# Patient Record
Sex: Female | Born: 1935 | Race: Black or African American | Hispanic: No | Smoking: Never smoker
Health system: Southern US, Community
[De-identification: ages and names within clinical notes are randomized; demographics above are authoritative.]

## PROBLEM LIST (undated history)

## (undated) DIAGNOSIS — I219 Acute myocardial infarction, unspecified: Secondary | ICD-10-CM

## (undated) DIAGNOSIS — Z992 Dependence on renal dialysis: Secondary | ICD-10-CM

## (undated) DIAGNOSIS — I739 Peripheral vascular disease, unspecified: Secondary | ICD-10-CM

## (undated) DIAGNOSIS — J449 Chronic obstructive pulmonary disease, unspecified: Secondary | ICD-10-CM

## (undated) DIAGNOSIS — K5790 Diverticulosis of intestine, part unspecified, without perforation or abscess without bleeding: Secondary | ICD-10-CM

## (undated) DIAGNOSIS — E785 Hyperlipidemia, unspecified: Secondary | ICD-10-CM

## (undated) DIAGNOSIS — D649 Anemia, unspecified: Secondary | ICD-10-CM

## (undated) DIAGNOSIS — K219 Gastro-esophageal reflux disease without esophagitis: Secondary | ICD-10-CM

## (undated) DIAGNOSIS — I639 Cerebral infarction, unspecified: Secondary | ICD-10-CM

## (undated) DIAGNOSIS — M2040 Other hammer toe(s) (acquired), unspecified foot: Secondary | ICD-10-CM

## (undated) DIAGNOSIS — K648 Other hemorrhoids: Secondary | ICD-10-CM

## (undated) DIAGNOSIS — N186 End stage renal disease: Secondary | ICD-10-CM

## (undated) DIAGNOSIS — K635 Polyp of colon: Secondary | ICD-10-CM

## (undated) DIAGNOSIS — I10 Essential (primary) hypertension: Secondary | ICD-10-CM

## (undated) HISTORY — PX: AV FISTULA PLACEMENT: SHX1204

## (undated) HISTORY — DX: Gastro-esophageal reflux disease without esophagitis: K21.9

## (undated) HISTORY — DX: Other hemorrhoids: K64.8

## (undated) HISTORY — DX: Hyperlipidemia, unspecified: E78.5

## (undated) HISTORY — DX: Anemia, unspecified: D64.9

## (undated) HISTORY — PX: ABDOMINAL HYSTERECTOMY: SHX81

## (undated) HISTORY — DX: Other hammer toe(s) (acquired), unspecified foot: M20.40

## (undated) HISTORY — PX: EYE SURGERY: SHX253

## (undated) HISTORY — DX: Polyp of colon: K63.5

## (undated) HISTORY — PX: CATARACT EXTRACTION: SUR2

## (undated) HISTORY — DX: Diverticulosis of intestine, part unspecified, without perforation or abscess without bleeding: K57.90

## (undated) HISTORY — DX: Peripheral vascular disease, unspecified: I73.9

## (undated) HISTORY — DX: Essential (primary) hypertension: I10

## (undated) HISTORY — DX: Chronic obstructive pulmonary disease, unspecified: J44.9

---

## 1997-02-21 ENCOUNTER — Encounter (INDEPENDENT_AMBULATORY_CARE_PROVIDER_SITE_OTHER): Payer: Self-pay | Admitting: *Deleted

## 1997-12-17 ENCOUNTER — Encounter: Admission: RE | Admit: 1997-12-17 | Discharge: 1997-12-17 | Payer: Self-pay | Admitting: Family Medicine

## 1998-01-24 ENCOUNTER — Encounter: Admission: RE | Admit: 1998-01-24 | Discharge: 1998-01-24 | Payer: Self-pay | Admitting: Family Medicine

## 1998-02-06 ENCOUNTER — Emergency Department (HOSPITAL_COMMUNITY): Admission: EM | Admit: 1998-02-06 | Discharge: 1998-02-06 | Payer: Self-pay | Admitting: Emergency Medicine

## 1998-02-14 ENCOUNTER — Emergency Department (HOSPITAL_COMMUNITY): Admission: EM | Admit: 1998-02-14 | Discharge: 1998-02-14 | Payer: Self-pay | Admitting: Emergency Medicine

## 1998-05-22 ENCOUNTER — Encounter: Admission: RE | Admit: 1998-05-22 | Discharge: 1998-05-22 | Payer: Self-pay | Admitting: Family Medicine

## 1998-05-28 ENCOUNTER — Ambulatory Visit (HOSPITAL_COMMUNITY): Admission: RE | Admit: 1998-05-28 | Discharge: 1998-05-28 | Payer: Self-pay | Admitting: Ophthalmology

## 1998-06-18 ENCOUNTER — Encounter: Admission: RE | Admit: 1998-06-18 | Discharge: 1998-06-18 | Payer: Self-pay | Admitting: Sports Medicine

## 1998-07-26 ENCOUNTER — Encounter: Admission: RE | Admit: 1998-07-26 | Discharge: 1998-07-26 | Payer: Self-pay | Admitting: Family Medicine

## 1998-10-28 ENCOUNTER — Encounter: Admission: RE | Admit: 1998-10-28 | Discharge: 1998-10-28 | Payer: Self-pay | Admitting: Family Medicine

## 1999-03-18 ENCOUNTER — Encounter: Admission: RE | Admit: 1999-03-18 | Discharge: 1999-03-18 | Payer: Self-pay | Admitting: Family Medicine

## 1999-05-02 ENCOUNTER — Encounter: Admission: RE | Admit: 1999-05-02 | Discharge: 1999-05-02 | Payer: Self-pay | Admitting: Family Medicine

## 1999-07-16 ENCOUNTER — Encounter: Admission: RE | Admit: 1999-07-16 | Discharge: 1999-07-16 | Payer: Self-pay | Admitting: Family Medicine

## 1999-09-29 ENCOUNTER — Encounter: Admission: RE | Admit: 1999-09-29 | Discharge: 1999-09-29 | Payer: Self-pay | Admitting: Family Medicine

## 1999-10-20 ENCOUNTER — Encounter: Admission: RE | Admit: 1999-10-20 | Discharge: 1999-10-20 | Payer: Self-pay | Admitting: *Deleted

## 1999-10-20 ENCOUNTER — Encounter: Payer: Self-pay | Admitting: *Deleted

## 1999-11-11 ENCOUNTER — Encounter: Admission: RE | Admit: 1999-11-11 | Discharge: 1999-11-11 | Payer: Self-pay | Admitting: Family Medicine

## 2000-01-05 ENCOUNTER — Encounter: Admission: RE | Admit: 2000-01-05 | Discharge: 2000-01-05 | Payer: Self-pay | Admitting: Family Medicine

## 2000-08-31 ENCOUNTER — Encounter: Admission: RE | Admit: 2000-08-31 | Discharge: 2000-08-31 | Payer: Self-pay | Admitting: Family Medicine

## 2000-09-22 ENCOUNTER — Encounter: Admission: RE | Admit: 2000-09-22 | Discharge: 2000-09-22 | Payer: Self-pay | Admitting: Family Medicine

## 2000-10-21 ENCOUNTER — Encounter: Payer: Self-pay | Admitting: Sports Medicine

## 2000-10-21 ENCOUNTER — Encounter: Admission: RE | Admit: 2000-10-21 | Discharge: 2000-10-21 | Payer: Self-pay | Admitting: *Deleted

## 2000-11-17 ENCOUNTER — Encounter: Admission: RE | Admit: 2000-11-17 | Discharge: 2000-11-17 | Payer: Self-pay | Admitting: Family Medicine

## 2000-12-22 ENCOUNTER — Encounter: Admission: RE | Admit: 2000-12-22 | Discharge: 2000-12-22 | Payer: Self-pay | Admitting: Family Medicine

## 2001-04-07 ENCOUNTER — Encounter: Admission: RE | Admit: 2001-04-07 | Discharge: 2001-04-07 | Payer: Self-pay | Admitting: Family Medicine

## 2001-04-19 ENCOUNTER — Encounter: Admission: RE | Admit: 2001-04-19 | Discharge: 2001-04-19 | Payer: Self-pay | Admitting: Sports Medicine

## 2001-04-19 ENCOUNTER — Encounter: Payer: Self-pay | Admitting: Sports Medicine

## 2001-04-21 ENCOUNTER — Encounter: Admission: RE | Admit: 2001-04-21 | Discharge: 2001-04-21 | Payer: Self-pay | Admitting: Sports Medicine

## 2001-05-09 ENCOUNTER — Encounter: Admission: RE | Admit: 2001-05-09 | Discharge: 2001-05-09 | Payer: Self-pay | Admitting: Family Medicine

## 2001-07-04 ENCOUNTER — Encounter: Admission: RE | Admit: 2001-07-04 | Discharge: 2001-07-04 | Payer: Self-pay | Admitting: Family Medicine

## 2001-07-13 ENCOUNTER — Ambulatory Visit (HOSPITAL_COMMUNITY): Admission: RE | Admit: 2001-07-13 | Discharge: 2001-07-13 | Payer: Self-pay | Admitting: Gastroenterology

## 2001-07-13 ENCOUNTER — Encounter (INDEPENDENT_AMBULATORY_CARE_PROVIDER_SITE_OTHER): Payer: Self-pay | Admitting: *Deleted

## 2001-07-15 ENCOUNTER — Encounter: Payer: Self-pay | Admitting: Gastroenterology

## 2001-07-15 ENCOUNTER — Ambulatory Visit (HOSPITAL_COMMUNITY): Admission: RE | Admit: 2001-07-15 | Discharge: 2001-07-15 | Payer: Self-pay | Admitting: Gastroenterology

## 2001-08-25 ENCOUNTER — Inpatient Hospital Stay (HOSPITAL_COMMUNITY): Admission: RE | Admit: 2001-08-25 | Discharge: 2001-09-01 | Payer: Self-pay | Admitting: Surgery

## 2001-08-25 ENCOUNTER — Encounter (INDEPENDENT_AMBULATORY_CARE_PROVIDER_SITE_OTHER): Payer: Self-pay | Admitting: Specialist

## 2001-11-17 ENCOUNTER — Encounter: Admission: RE | Admit: 2001-11-17 | Discharge: 2001-11-17 | Payer: Self-pay | Admitting: Family Medicine

## 2001-12-08 ENCOUNTER — Encounter: Admission: RE | Admit: 2001-12-08 | Discharge: 2001-12-08 | Payer: Self-pay | Admitting: Family Medicine

## 2001-12-15 ENCOUNTER — Encounter: Payer: Self-pay | Admitting: Sports Medicine

## 2001-12-15 ENCOUNTER — Encounter: Admission: RE | Admit: 2001-12-15 | Discharge: 2001-12-15 | Payer: Self-pay | Admitting: Sports Medicine

## 2002-01-16 ENCOUNTER — Encounter: Admission: RE | Admit: 2002-01-16 | Discharge: 2002-01-16 | Payer: Self-pay | Admitting: Family Medicine

## 2002-02-16 ENCOUNTER — Encounter: Admission: RE | Admit: 2002-02-16 | Discharge: 2002-02-16 | Payer: Self-pay | Admitting: Sports Medicine

## 2002-02-20 ENCOUNTER — Encounter: Admission: RE | Admit: 2002-02-20 | Discharge: 2002-02-20 | Payer: Self-pay | Admitting: Family Medicine

## 2002-03-03 ENCOUNTER — Encounter: Admission: RE | Admit: 2002-03-03 | Discharge: 2002-03-03 | Payer: Self-pay | Admitting: Family Medicine

## 2002-04-03 ENCOUNTER — Encounter: Admission: RE | Admit: 2002-04-03 | Discharge: 2002-04-03 | Payer: Self-pay | Admitting: Family Medicine

## 2002-04-26 ENCOUNTER — Ambulatory Visit (HOSPITAL_COMMUNITY): Admission: RE | Admit: 2002-04-26 | Discharge: 2002-04-26 | Payer: Self-pay | Admitting: Gastroenterology

## 2002-04-26 ENCOUNTER — Encounter (INDEPENDENT_AMBULATORY_CARE_PROVIDER_SITE_OTHER): Payer: Self-pay | Admitting: Specialist

## 2002-08-09 ENCOUNTER — Encounter: Admission: RE | Admit: 2002-08-09 | Discharge: 2002-08-09 | Payer: Self-pay | Admitting: Family Medicine

## 2003-01-24 ENCOUNTER — Encounter: Admission: RE | Admit: 2003-01-24 | Discharge: 2003-01-24 | Payer: Self-pay | Admitting: Family Medicine

## 2003-01-31 ENCOUNTER — Encounter: Payer: Self-pay | Admitting: Sports Medicine

## 2003-01-31 ENCOUNTER — Encounter: Admission: RE | Admit: 2003-01-31 | Discharge: 2003-01-31 | Payer: Self-pay | Admitting: Sports Medicine

## 2003-02-22 ENCOUNTER — Encounter: Admission: RE | Admit: 2003-02-22 | Discharge: 2003-02-22 | Payer: Self-pay | Admitting: Sports Medicine

## 2003-04-17 ENCOUNTER — Encounter: Admission: RE | Admit: 2003-04-17 | Discharge: 2003-04-17 | Payer: Self-pay | Admitting: Sports Medicine

## 2003-07-10 ENCOUNTER — Encounter: Admission: RE | Admit: 2003-07-10 | Discharge: 2003-07-10 | Payer: Self-pay | Admitting: Sports Medicine

## 2003-08-07 ENCOUNTER — Encounter: Admission: RE | Admit: 2003-08-07 | Discharge: 2003-08-07 | Payer: Self-pay | Admitting: Sports Medicine

## 2003-08-13 ENCOUNTER — Encounter: Admission: RE | Admit: 2003-08-13 | Discharge: 2003-08-13 | Payer: Self-pay | Admitting: Family Medicine

## 2003-09-28 ENCOUNTER — Encounter: Admission: RE | Admit: 2003-09-28 | Discharge: 2003-09-28 | Payer: Self-pay | Admitting: Family Medicine

## 2003-10-25 ENCOUNTER — Encounter: Admission: RE | Admit: 2003-10-25 | Discharge: 2003-10-25 | Payer: Self-pay | Admitting: Family Medicine

## 2003-12-26 ENCOUNTER — Encounter: Admission: RE | Admit: 2003-12-26 | Discharge: 2003-12-26 | Payer: Self-pay | Admitting: Family Medicine

## 2004-02-29 ENCOUNTER — Encounter: Admission: RE | Admit: 2004-02-29 | Discharge: 2004-02-29 | Payer: Self-pay | Admitting: Family Medicine

## 2004-04-30 ENCOUNTER — Ambulatory Visit: Payer: Self-pay | Admitting: Family Medicine

## 2004-05-07 ENCOUNTER — Ambulatory Visit: Payer: Self-pay | Admitting: Family Medicine

## 2004-05-12 ENCOUNTER — Encounter: Admission: RE | Admit: 2004-05-12 | Discharge: 2004-05-12 | Payer: Self-pay | Admitting: Sports Medicine

## 2005-03-18 ENCOUNTER — Ambulatory Visit: Payer: Self-pay | Admitting: Family Medicine

## 2005-05-26 ENCOUNTER — Ambulatory Visit: Payer: Self-pay | Admitting: Sports Medicine

## 2005-06-01 ENCOUNTER — Encounter (INDEPENDENT_AMBULATORY_CARE_PROVIDER_SITE_OTHER): Payer: Self-pay | Admitting: *Deleted

## 2005-06-01 ENCOUNTER — Ambulatory Visit (HOSPITAL_COMMUNITY): Admission: RE | Admit: 2005-06-01 | Discharge: 2005-06-01 | Payer: Self-pay | Admitting: Gastroenterology

## 2005-06-24 ENCOUNTER — Ambulatory Visit: Payer: Self-pay | Admitting: Family Medicine

## 2005-07-31 ENCOUNTER — Ambulatory Visit: Payer: Self-pay | Admitting: Family Medicine

## 2005-08-18 ENCOUNTER — Encounter: Admission: RE | Admit: 2005-08-18 | Discharge: 2005-08-18 | Payer: Self-pay | Admitting: Sports Medicine

## 2006-03-02 ENCOUNTER — Ambulatory Visit: Payer: Self-pay | Admitting: Family Medicine

## 2006-04-28 ENCOUNTER — Ambulatory Visit: Payer: Self-pay | Admitting: Family Medicine

## 2006-06-30 ENCOUNTER — Ambulatory Visit: Payer: Self-pay | Admitting: Family Medicine

## 2006-08-26 ENCOUNTER — Ambulatory Visit: Payer: Self-pay | Admitting: Family Medicine

## 2006-10-05 ENCOUNTER — Encounter: Admission: RE | Admit: 2006-10-05 | Discharge: 2006-10-05 | Payer: Self-pay | Admitting: Sports Medicine

## 2006-10-22 ENCOUNTER — Encounter (INDEPENDENT_AMBULATORY_CARE_PROVIDER_SITE_OTHER): Payer: Self-pay | Admitting: *Deleted

## 2006-10-27 ENCOUNTER — Telehealth: Payer: Self-pay | Admitting: *Deleted

## 2006-10-27 ENCOUNTER — Encounter: Payer: Self-pay | Admitting: *Deleted

## 2006-10-29 ENCOUNTER — Ambulatory Visit: Payer: Self-pay | Admitting: Sports Medicine

## 2006-10-29 ENCOUNTER — Encounter (INDEPENDENT_AMBULATORY_CARE_PROVIDER_SITE_OTHER): Payer: Self-pay | Admitting: Family Medicine

## 2006-10-29 DIAGNOSIS — K279 Peptic ulcer, site unspecified, unspecified as acute or chronic, without hemorrhage or perforation: Secondary | ICD-10-CM | POA: Insufficient documentation

## 2006-10-29 DIAGNOSIS — K219 Gastro-esophageal reflux disease without esophagitis: Secondary | ICD-10-CM

## 2006-10-29 DIAGNOSIS — E1165 Type 2 diabetes mellitus with hyperglycemia: Secondary | ICD-10-CM

## 2006-10-29 DIAGNOSIS — E785 Hyperlipidemia, unspecified: Secondary | ICD-10-CM | POA: Insufficient documentation

## 2006-10-29 LAB — CONVERTED CEMR LAB: Hgb A1c MFr Bld: 7 %

## 2006-12-06 ENCOUNTER — Ambulatory Visit: Payer: Self-pay | Admitting: Family Medicine

## 2006-12-06 ENCOUNTER — Encounter (INDEPENDENT_AMBULATORY_CARE_PROVIDER_SITE_OTHER): Payer: Self-pay | Admitting: Family Medicine

## 2006-12-06 LAB — CONVERTED CEMR LAB
Calcium: 9.8 mg/dL (ref 8.4–10.5)
Creatinine, Ser: 1.51 mg/dL — ABNORMAL HIGH (ref 0.40–1.20)
Sodium: 144 meq/L (ref 135–145)

## 2006-12-07 ENCOUNTER — Telehealth: Payer: Self-pay | Admitting: *Deleted

## 2006-12-10 ENCOUNTER — Encounter: Admission: RE | Admit: 2006-12-10 | Discharge: 2006-12-10 | Payer: Self-pay | Admitting: Sports Medicine

## 2006-12-22 ENCOUNTER — Encounter (INDEPENDENT_AMBULATORY_CARE_PROVIDER_SITE_OTHER): Payer: Self-pay | Admitting: Family Medicine

## 2007-01-04 ENCOUNTER — Ambulatory Visit: Payer: Self-pay | Admitting: Family Medicine

## 2007-01-04 ENCOUNTER — Encounter: Admission: RE | Admit: 2007-01-04 | Discharge: 2007-01-04 | Payer: Self-pay | Admitting: Sports Medicine

## 2007-02-02 ENCOUNTER — Ambulatory Visit: Payer: Self-pay | Admitting: Sports Medicine

## 2007-02-02 LAB — CONVERTED CEMR LAB: Hgb A1c MFr Bld: 7.3 %

## 2007-02-16 ENCOUNTER — Encounter (INDEPENDENT_AMBULATORY_CARE_PROVIDER_SITE_OTHER): Payer: Self-pay | Admitting: Family Medicine

## 2007-04-12 ENCOUNTER — Encounter (INDEPENDENT_AMBULATORY_CARE_PROVIDER_SITE_OTHER): Payer: Self-pay | Admitting: Family Medicine

## 2007-05-06 ENCOUNTER — Ambulatory Visit: Payer: Self-pay | Admitting: Family Medicine

## 2007-05-06 LAB — CONVERTED CEMR LAB: Hgb A1c MFr Bld: 6.9 %

## 2007-05-10 ENCOUNTER — Encounter (INDEPENDENT_AMBULATORY_CARE_PROVIDER_SITE_OTHER): Payer: Self-pay | Admitting: Family Medicine

## 2007-05-17 ENCOUNTER — Encounter (INDEPENDENT_AMBULATORY_CARE_PROVIDER_SITE_OTHER): Payer: Self-pay | Admitting: Family Medicine

## 2007-05-26 ENCOUNTER — Encounter: Admission: RE | Admit: 2007-05-26 | Discharge: 2007-05-26 | Payer: Self-pay | Admitting: Sports Medicine

## 2007-05-27 ENCOUNTER — Telehealth (INDEPENDENT_AMBULATORY_CARE_PROVIDER_SITE_OTHER): Payer: Self-pay | Admitting: Family Medicine

## 2007-06-10 ENCOUNTER — Ambulatory Visit: Payer: Self-pay | Admitting: Internal Medicine

## 2007-06-10 ENCOUNTER — Encounter (INDEPENDENT_AMBULATORY_CARE_PROVIDER_SITE_OTHER): Payer: Self-pay | Admitting: Family Medicine

## 2007-06-10 DIAGNOSIS — I10 Essential (primary) hypertension: Secondary | ICD-10-CM

## 2007-06-14 ENCOUNTER — Encounter: Admission: RE | Admit: 2007-06-14 | Discharge: 2007-06-14 | Payer: Self-pay | Admitting: Sports Medicine

## 2007-06-15 ENCOUNTER — Telehealth (INDEPENDENT_AMBULATORY_CARE_PROVIDER_SITE_OTHER): Payer: Self-pay | Admitting: *Deleted

## 2007-07-18 ENCOUNTER — Encounter (INDEPENDENT_AMBULATORY_CARE_PROVIDER_SITE_OTHER): Payer: Self-pay | Admitting: Family Medicine

## 2007-07-18 ENCOUNTER — Ambulatory Visit: Payer: Self-pay | Admitting: Family Medicine

## 2007-07-18 LAB — CONVERTED CEMR LAB

## 2007-07-19 LAB — CONVERTED CEMR LAB
Calcium: 10 mg/dL (ref 8.4–10.5)
Potassium: 4.5 meq/L (ref 3.5–5.3)
Sodium: 143 meq/L (ref 135–145)

## 2007-07-20 ENCOUNTER — Ambulatory Visit: Payer: Self-pay | Admitting: Family Medicine

## 2007-09-06 ENCOUNTER — Ambulatory Visit: Payer: Self-pay | Admitting: Family Medicine

## 2007-10-10 ENCOUNTER — Encounter (INDEPENDENT_AMBULATORY_CARE_PROVIDER_SITE_OTHER): Payer: Self-pay | Admitting: Family Medicine

## 2007-10-18 ENCOUNTER — Encounter: Payer: Self-pay | Admitting: *Deleted

## 2007-11-08 ENCOUNTER — Ambulatory Visit: Payer: Self-pay | Admitting: Family Medicine

## 2007-11-08 LAB — CONVERTED CEMR LAB: Hgb A1c MFr Bld: 7.5 %

## 2007-12-06 ENCOUNTER — Encounter: Admission: RE | Admit: 2007-12-06 | Discharge: 2007-12-06 | Payer: Self-pay | Admitting: Family Medicine

## 2008-05-23 ENCOUNTER — Encounter: Payer: Self-pay | Admitting: Family Medicine

## 2008-06-20 ENCOUNTER — Ambulatory Visit: Payer: Self-pay | Admitting: Family Medicine

## 2008-06-20 LAB — CONVERTED CEMR LAB: Hgb A1c MFr Bld: 7.3 %

## 2008-06-21 ENCOUNTER — Encounter: Payer: Self-pay | Admitting: Family Medicine

## 2008-06-21 ENCOUNTER — Telehealth: Payer: Self-pay | Admitting: *Deleted

## 2008-06-21 LAB — CONVERTED CEMR LAB

## 2008-06-26 ENCOUNTER — Ambulatory Visit: Payer: Self-pay | Admitting: Family Medicine

## 2008-06-26 ENCOUNTER — Observation Stay (HOSPITAL_COMMUNITY): Admission: EM | Admit: 2008-06-26 | Discharge: 2008-06-27 | Payer: Self-pay | Admitting: Emergency Medicine

## 2008-07-09 ENCOUNTER — Ambulatory Visit: Payer: Self-pay | Admitting: Family Medicine

## 2008-07-09 ENCOUNTER — Encounter: Payer: Self-pay | Admitting: Family Medicine

## 2008-07-09 LAB — CONVERTED CEMR LAB
HDL: 63 mg/dL (ref >39)
LDL Cholesterol: 53 mg/dL (ref 0–99)
Total CHOL/HDL Ratio: 2
VLDL: 13 mg/dL (ref 0–40)

## 2008-07-11 ENCOUNTER — Ambulatory Visit: Payer: Self-pay | Admitting: Family Medicine

## 2008-10-19 ENCOUNTER — Telehealth: Payer: Self-pay | Admitting: Family Medicine

## 2008-11-27 ENCOUNTER — Ambulatory Visit: Payer: Self-pay | Admitting: Family Medicine

## 2008-11-27 LAB — CONVERTED CEMR LAB: Hgb A1c MFr Bld: 8.3 %

## 2009-02-04 ENCOUNTER — Telehealth: Payer: Self-pay | Admitting: Family Medicine

## 2009-02-05 ENCOUNTER — Encounter: Admission: RE | Admit: 2009-02-05 | Discharge: 2009-02-05 | Payer: Self-pay | Admitting: Family Medicine

## 2009-02-05 ENCOUNTER — Ambulatory Visit: Payer: Self-pay | Admitting: Family Medicine

## 2009-02-19 ENCOUNTER — Encounter: Admission: RE | Admit: 2009-02-19 | Discharge: 2009-02-19 | Payer: Self-pay | Admitting: Family Medicine

## 2009-03-14 ENCOUNTER — Ambulatory Visit: Payer: Self-pay | Admitting: Family Medicine

## 2009-03-18 ENCOUNTER — Encounter: Payer: Self-pay | Admitting: Family Medicine

## 2009-03-18 ENCOUNTER — Encounter: Admission: RE | Admit: 2009-03-18 | Discharge: 2009-03-18 | Payer: Self-pay | Admitting: Family Medicine

## 2009-03-18 ENCOUNTER — Ambulatory Visit: Payer: Self-pay | Admitting: Family Medicine

## 2009-03-19 LAB — CONVERTED CEMR LAB
ALT: 9 units/L (ref 0–35)
AST: 12 units/L (ref 0–37)
Albumin: 3.4 g/dL — ABNORMAL LOW (ref 3.5–5.2)
Alkaline Phosphatase: 76 units/L (ref 39–117)
BUN: 28 mg/dL — ABNORMAL HIGH (ref 6–23)
Creatinine, Ser: 2.38 mg/dL — ABNORMAL HIGH (ref 0.40–1.20)
Hemoglobin: 9.3 g/dL — ABNORMAL LOW (ref 12.0–15.0)
MCHC: 30.9 g/dL (ref 30.0–36.0)
Platelets: 205 10*3/uL (ref 150–400)
Potassium: 4.1 meq/L (ref 3.5–5.3)
RDW: 15.5 % (ref 11.5–15.5)

## 2009-03-20 ENCOUNTER — Telehealth: Payer: Self-pay | Admitting: Family Medicine

## 2009-03-20 DIAGNOSIS — D539 Nutritional anemia, unspecified: Secondary | ICD-10-CM

## 2009-04-03 ENCOUNTER — Telehealth: Payer: Self-pay | Admitting: *Deleted

## 2009-05-07 ENCOUNTER — Telehealth: Payer: Self-pay | Admitting: Family Medicine

## 2009-05-11 ENCOUNTER — Ambulatory Visit: Payer: Self-pay | Admitting: Family Medicine

## 2009-05-11 ENCOUNTER — Encounter: Payer: Self-pay | Admitting: Family Medicine

## 2009-05-11 ENCOUNTER — Observation Stay (HOSPITAL_COMMUNITY): Admission: EM | Admit: 2009-05-11 | Discharge: 2009-05-13 | Payer: Self-pay | Admitting: Emergency Medicine

## 2009-05-17 ENCOUNTER — Ambulatory Visit: Payer: Self-pay | Admitting: Family Medicine

## 2009-05-17 ENCOUNTER — Encounter: Payer: Self-pay | Admitting: Family Medicine

## 2009-05-17 LAB — CONVERTED CEMR LAB
CO2: 24 meq/L (ref 19–32)
Glucose, Bld: 279 mg/dL — ABNORMAL HIGH (ref 70–99)
Potassium: 4.2 meq/L (ref 3.5–5.3)
Sodium: 141 meq/L (ref 135–145)

## 2009-05-27 ENCOUNTER — Encounter: Payer: Self-pay | Admitting: Family Medicine

## 2009-06-20 ENCOUNTER — Encounter: Payer: Self-pay | Admitting: *Deleted

## 2009-06-20 ENCOUNTER — Ambulatory Visit: Payer: Self-pay | Admitting: Family Medicine

## 2009-06-20 ENCOUNTER — Encounter: Payer: Self-pay | Admitting: Family Medicine

## 2009-06-20 LAB — CONVERTED CEMR LAB
AST: 14 units/L (ref 0–37)
Alkaline Phosphatase: 100 units/L (ref 39–117)
BUN: 31 mg/dL — ABNORMAL HIGH (ref 6–23)
Creatinine, Ser: 2.41 mg/dL — ABNORMAL HIGH (ref 0.40–1.20)
HCT: 30.9 % — ABNORMAL LOW (ref 36.0–46.0)
Hemoglobin: 9.6 g/dL — ABNORMAL LOW (ref 12.0–15.0)
MCHC: 31.1 g/dL (ref 30.0–36.0)
RDW: 13.5 % (ref 11.5–15.5)
Total Bilirubin: 0.3 mg/dL (ref 0.3–1.2)

## 2009-07-01 ENCOUNTER — Encounter: Payer: Self-pay | Admitting: Family Medicine

## 2009-07-01 ENCOUNTER — Observation Stay (HOSPITAL_COMMUNITY): Admission: EM | Admit: 2009-07-01 | Discharge: 2009-07-02 | Payer: Self-pay | Admitting: Emergency Medicine

## 2009-07-01 ENCOUNTER — Ambulatory Visit: Payer: Self-pay | Admitting: Family Medicine

## 2009-07-09 ENCOUNTER — Ambulatory Visit: Payer: Self-pay | Admitting: Family Medicine

## 2009-07-29 ENCOUNTER — Encounter: Payer: Self-pay | Admitting: Family Medicine

## 2009-07-30 ENCOUNTER — Encounter: Payer: Self-pay | Admitting: Family Medicine

## 2009-08-20 ENCOUNTER — Ambulatory Visit: Payer: Self-pay | Admitting: Family Medicine

## 2009-08-28 ENCOUNTER — Encounter: Payer: Self-pay | Admitting: Family Medicine

## 2009-08-29 ENCOUNTER — Telehealth: Payer: Self-pay | Admitting: *Deleted

## 2009-09-02 ENCOUNTER — Other Ambulatory Visit: Payer: Self-pay | Admitting: Emergency Medicine

## 2009-09-02 ENCOUNTER — Ambulatory Visit (HOSPITAL_COMMUNITY): Admission: EM | Admit: 2009-09-02 | Discharge: 2009-09-02 | Payer: Self-pay | Admitting: Interventional Cardiology

## 2009-09-05 ENCOUNTER — Ambulatory Visit: Payer: Self-pay | Admitting: Family Medicine

## 2009-09-05 LAB — CONVERTED CEMR LAB: H Pylori IgG: POSITIVE

## 2009-09-30 ENCOUNTER — Emergency Department (HOSPITAL_COMMUNITY): Admission: EM | Admit: 2009-09-30 | Discharge: 2009-09-30 | Payer: Self-pay | Admitting: Emergency Medicine

## 2009-09-30 ENCOUNTER — Encounter: Payer: Self-pay | Admitting: Family Medicine

## 2009-10-01 ENCOUNTER — Ambulatory Visit: Payer: Self-pay | Admitting: Family Medicine

## 2009-10-03 ENCOUNTER — Ambulatory Visit: Payer: Self-pay | Admitting: Family Medicine

## 2009-10-17 ENCOUNTER — Encounter: Payer: Self-pay | Admitting: Family Medicine

## 2009-10-17 ENCOUNTER — Ambulatory Visit: Payer: Self-pay

## 2009-10-17 LAB — CONVERTED CEMR LAB: Hgb A1c MFr Bld: 7.6 %

## 2009-10-21 LAB — CONVERTED CEMR LAB
AST: 20 units/L (ref 0–37)
Albumin: 3.6 g/dL (ref 3.5–5.2)
BUN: 29 mg/dL — ABNORMAL HIGH (ref 6–23)
Calcium: 9.3 mg/dL (ref 8.4–10.5)
Chloride: 106 meq/L (ref 96–112)
Glucose, Bld: 90 mg/dL (ref 70–99)
Hemoglobin: 9.7 g/dL — ABNORMAL LOW (ref 12.0–15.0)
Potassium: 4.4 meq/L (ref 3.5–5.3)
RBC: 3.22 M/uL — ABNORMAL LOW (ref 3.87–5.11)
RDW: 13.8 % (ref 11.5–15.5)

## 2009-11-19 ENCOUNTER — Ambulatory Visit: Payer: Self-pay | Admitting: Family Medicine

## 2009-12-17 ENCOUNTER — Encounter: Payer: Self-pay | Admitting: Family Medicine

## 2009-12-19 ENCOUNTER — Encounter: Payer: Self-pay | Admitting: Family Medicine

## 2010-01-07 ENCOUNTER — Telehealth: Payer: Self-pay | Admitting: *Deleted

## 2010-01-08 ENCOUNTER — Encounter: Payer: Self-pay | Admitting: Family Medicine

## 2010-01-08 ENCOUNTER — Encounter: Admission: RE | Admit: 2010-01-08 | Discharge: 2010-01-08 | Payer: Self-pay | Admitting: Family Medicine

## 2010-01-08 ENCOUNTER — Ambulatory Visit: Payer: Self-pay | Admitting: Family Medicine

## 2010-01-10 ENCOUNTER — Telehealth: Payer: Self-pay | Admitting: Family Medicine

## 2010-01-13 ENCOUNTER — Encounter: Payer: Self-pay | Admitting: Family Medicine

## 2010-01-13 LAB — CONVERTED CEMR LAB
ALT: 14 units/L (ref 0–35)
AST: 15 units/L (ref 0–37)
Alkaline Phosphatase: 122 units/L — ABNORMAL HIGH (ref 39–117)
Basophils Absolute: 0 10*3/uL (ref 0.0–0.1)
Basophils Relative: 0 % (ref 0–1)
MCHC: 31.4 g/dL (ref 30.0–36.0)
Monocytes Relative: 5 % (ref 3–12)
Neutro Abs: 4.2 10*3/uL (ref 1.7–7.7)
Neutrophils Relative %: 68 % (ref 43–77)
RBC: 3.54 M/uL — ABNORMAL LOW (ref 3.87–5.11)
Sodium: 142 meq/L (ref 135–145)
Total Bilirubin: 0.5 mg/dL (ref 0.3–1.2)
Total Protein: 8.2 g/dL (ref 6.0–8.3)

## 2010-01-23 ENCOUNTER — Encounter: Payer: Self-pay | Admitting: Family Medicine

## 2010-02-11 ENCOUNTER — Encounter: Payer: Self-pay | Admitting: Family Medicine

## 2010-02-11 ENCOUNTER — Ambulatory Visit: Payer: Self-pay | Admitting: Family Medicine

## 2010-02-11 DIAGNOSIS — R000 Tachycardia, unspecified: Secondary | ICD-10-CM

## 2010-04-07 ENCOUNTER — Encounter: Payer: Self-pay | Admitting: Family Medicine

## 2010-05-05 ENCOUNTER — Encounter: Payer: Self-pay | Admitting: Family Medicine

## 2010-05-07 ENCOUNTER — Ambulatory Visit: Payer: Self-pay | Admitting: Family Medicine

## 2010-05-07 DIAGNOSIS — Z992 Dependence on renal dialysis: Secondary | ICD-10-CM

## 2010-05-07 DIAGNOSIS — N186 End stage renal disease: Secondary | ICD-10-CM

## 2010-06-16 ENCOUNTER — Ambulatory Visit: Payer: Self-pay | Admitting: Family Medicine

## 2010-07-21 ENCOUNTER — Encounter: Payer: Self-pay | Admitting: Family Medicine

## 2010-08-22 ENCOUNTER — Ambulatory Visit: Payer: Self-pay | Admitting: Vascular Surgery

## 2010-09-01 ENCOUNTER — Encounter (HOSPITAL_COMMUNITY)
Admission: RE | Admit: 2010-09-01 | Discharge: 2010-09-23 | Payer: Self-pay | Source: Home / Self Care | Attending: Nephrology | Admitting: Nephrology

## 2010-09-08 LAB — POCT HEMOGLOBIN-HEMACUE: Hemoglobin: 8.6 g/dL — ABNORMAL LOW (ref 12.0–15.0)

## 2010-09-18 ENCOUNTER — Ambulatory Visit: Admit: 2010-09-18 | Payer: Self-pay | Admitting: Vascular Surgery

## 2010-09-18 ENCOUNTER — Ambulatory Visit
Admission: RE | Admit: 2010-09-18 | Discharge: 2010-09-18 | Payer: Self-pay | Source: Home / Self Care | Attending: Vascular Surgery | Admitting: Vascular Surgery

## 2010-09-19 NOTE — Assessment & Plan Note (Addendum)
OFFICE VISIT  Claudia Rich, Claudia Rich DOB:  1927/07/28                                       09/18/2010 KYHCW#:[id number]  CHIEF COMPLAINT:  Needs hemodialysis access.  HISTORY OF PRESENT ILLNESS:  The patient is an 77 year old female referred by Dr. Lowell Guitar for evaluation of hemodialysis access.  She currently is not on dialysis.  She is right-handed.  She does not take any Coumadin or warfarin.  She currently does not have urgent need for dialysis and hopefully would have a working fistula prior to starting on dialysis.  CHRONIC MEDICAL PROBLEMS:  Include diabetes, hypertension, elevated cholesterol.  These are followed by Dr. Lowell Guitar.  PAST SURGICAL HISTORY:  She denies any prior operations on her upper extremities.  SOCIAL HISTORY:  She is widowed.  She is retired.  She is a nonsmoker and nonconsumer of alcohol.  FAMILY HISTORY:  Not remarkable for vascular disease at age less than 50.  REVIEW OF SYSTEMS:  She is 5 feet 3 inches, 136 pounds.  All other systems are negative on 12 points.  Review of records and data from Dr. Roanna Banning office showed most recent creatinine was 4.7 in November of 2011.  PHYSICAL EXAM:  Vital signs:  Heart rate is 80, blood pressure is 146/86 in the right arm, oxygen saturation is 95% on room air.  HEENT: Unremarkable.  Neck:  Has 2+ carotid pulses without bruit.  Chest: Clear to auscultation.  Cardiac:  Regular rate and rhythm without murmur.  Abdomen:  Soft, nontender, nondistended.  No masses. Musculoskeletal:  Shows no major joint deformities.  Neurologic:  Has no focal weakness or paresthesias.  Skin:  Has no open ulcers or rashes. Peripheral vascular:  Exam shows 2+ brachial and radial pulses bilaterally.  I reviewed her vein mapping ultrasound today which showed that the cephalic vein in the left forearm is small at the wrist level and in the upper arm level is 35 to 40 mm in diameter.  In the right upper extremity  the vein again is small in the forearm but between 34 and 44 mm in diameter in the upper arm.  I believe the best option for this patient will be placement of a left brachiocephalic AV fistula.  We have scheduled this for 09/24/2010. Risks, benefits, possible complications and procedure details were explained to the patient today including but not limited to bleeding, infection, nonmaturation of the fistula.  She understands and agrees to proceed.    Janetta Hora. Rushi Chasen, MD Electronically Signed  CEF/MEDQ  D:  09/19/2010  T:  09/19/2010  Job:  [id number]  cc:   Mindi Slicker. Lowell Guitar, M.D.

## 2010-09-21 LAB — CONVERTED CEMR LAB
Calcium, Total (PTH): 10.4 mg/dL (ref 8.4–10.5)
Phosphorus: 3.7 mg/dL (ref 2.3–4.6)
Vit D, 1,25-Dihydroxy: 12.9 — ABNORMAL LOW (ref 20–57)

## 2010-09-24 ENCOUNTER — Observation Stay (HOSPITAL_COMMUNITY)
Admission: RE | Admit: 2010-09-24 | Discharge: 2010-09-25 | Disposition: A | Discharge status: Home Health | Payer: Medicare FFS | Attending: Vascular Surgery | Admitting: Vascular Surgery

## 2010-09-24 DIAGNOSIS — Z01812 Encounter for preprocedural laboratory examination: Secondary | ICD-10-CM | POA: Insufficient documentation

## 2010-09-24 DIAGNOSIS — I120 Hypertensive chronic kidney disease with stage 5 chronic kidney disease or end stage renal disease: Principal | ICD-10-CM | POA: Insufficient documentation

## 2010-09-24 DIAGNOSIS — N186 End stage renal disease: Secondary | ICD-10-CM

## 2010-09-24 DIAGNOSIS — E785 Hyperlipidemia, unspecified: Secondary | ICD-10-CM | POA: Insufficient documentation

## 2010-09-24 DIAGNOSIS — E119 Type 2 diabetes mellitus without complications: Secondary | ICD-10-CM | POA: Insufficient documentation

## 2010-09-24 DIAGNOSIS — Z8711 Personal history of peptic ulcer disease: Secondary | ICD-10-CM | POA: Insufficient documentation

## 2010-09-24 HISTORY — PX: AV FISTULA PLACEMENT, BRACHIOCEPHALIC: SHX1207

## 2010-09-24 LAB — POCT I-STAT 4, (NA,K, GLUC, HGB,HCT)
Glucose, Bld: 123 mg/dL — ABNORMAL HIGH (ref 70–99)
HCT: 27 % — ABNORMAL LOW (ref 36.0–46.0)

## 2010-09-24 LAB — SURGICAL PCR SCREEN
MRSA, PCR: NEGATIVE
Staphylococcus aureus: POSITIVE — AB

## 2010-09-25 LAB — GLUCOSE, CAPILLARY: Glucose-Capillary: 120 mg/dL — ABNORMAL HIGH (ref 70–99)

## 2010-09-25 NOTE — Assessment & Plan Note (Signed)
Summary: f/u   Vital Signs:  Patient profile:   77 year old female Height:      64.25 inches Weight:      126 pounds BMI:     21.54 BSA:     1.61 Temp:     97.9 degrees F Pulse rate:   109 / minute BP sitting:   168 / 90  Vitals Entered By: Jone Baseman CMA (February 11, 2010 4:25 PM) CC: f/u Is Patient Diabetic? Yes Did you bring your meter with you today? No Pain Assessment Patient in pain? no        Primary Care Provider:  Lequita Asal  MD  CC:  f/u.  History of Present Illness: DM- on glipizide and januvia. CBGs mostly mid 100s per CBG record. endorses polyuria, polydipsia. denies blurred vision.   HTN- patient supposed to be on clonidine, metoprolol, hydralazine. currently not taking clonidine. denies chest pain, SOB, blurred vision, headache. some occasional peripheral edema   Habits & Providers  Alcohol-Tobacco-Diet     Tobacco Status: never  Current Medications (verified): 1)  Bayer Childrens Aspirin 81 Mg Chew (Aspirin) .... Take 1 Tablet By Mouth Once A Day 2)  Glipizide 10 Mg Tabs (Glipizide) .... One Tab By Mouth Two Times A Day For Diabetes 3)  Lipitor 80 Mg Tabs (Atorvastatin Calcium) .... Take 1 Tablet By Mouth At Bedtime For Cholesterol 4)  Amlodipine Besylate 10 Mg  Tabs (Amlodipine Besylate) .... One Tab By Mouth Daily For Blood Pressure 5)  Accu-Chek Comfort Curve  Strp (Glucose Blood) .... Check Blood Sugar 3 Times Daily. Dispense 102 Strips. 6)  Catapres 0.3 Mg Tabs (Clonidine Hcl) .... One Tab By Mouth Two Times A Day For Blood Pressure 7)  Januvia 25 Mg Tabs (Sitagliptin Phosphate) .... One Tab By Mouth Daily For Diabetes 8)  Hydralazine Hcl 25 Mg Tabs (Hydralazine Hcl) .... One Tab By Mouth Three Times A Day For Blood Pressure 9)  Metoprolol Tartrate 25 Mg Tabs (Metoprolol Tartrate) .... One Tab By Mouth Two Times A Day For Blood Pressure 10)  Zantac 150 Mg Caps (Ranitidine Hcl) .... One Tab By Mouth Two Times A Day For Stomach Ulcer 11)   Furosemide 80 Mg Tabs (Furosemide) .... One Tab By Mouth Daily 12)  Ondansetron Hcl 4 Mg Tabs (Ondansetron Hcl) .... Take One Tablet Every 8 Hours As Needed For Nausea and Vomiting  Allergies (verified): 1)  ! Demerol 2)  ! Codeine Sulfate (Codeine Sulfate)  Physical Exam  General:  NAD, alert vital signs noted; tachycardia and elevated blood pressure. Mouth:  MMM Lungs:  normal work of breathing, no accessory muscle use. occasional coarse breath sounds at right base.  Heart:  tachycardic, regular rhythm.  Extremities:  no edema of BLE Neurologic:  alert & oriented X3.  nonfocal     Impression & Recommendations:  Problem # 1:  ESSENTIAL HYPERTENSION, BENIGN (ICD-401.1) Assessment Deteriorated  explained concept of rebound hypertension with clonidine to patient and that if she is unable to get one of her medications, that is not the one to skip. no changes for now.   Her updated medication list for this problem includes:    Amlodipine Besylate 10 Mg Tabs (Amlodipine besylate) ..... One tab by mouth daily for blood pressure    Catapres 0.3 Mg Tabs (Clonidine hcl) ..... One tab by mouth two times a day for blood pressure    Hydralazine Hcl 25 Mg Tabs (Hydralazine hcl) ..... One tab by mouth three times a  day for blood pressure    Metoprolol Tartrate 25 Mg Tabs (Metoprolol tartrate) ..... One tab by mouth two times a day for blood pressure    Furosemide 80 Mg Tabs (Furosemide) ..... One tab by mouth daily  Orders: [facility]- Est  Level 4 (09811)  Problem # 2:  DIABETES MELLITUS, TYPE II, UNCONTROLLED (ICD-250.02) Assessment: Unchanged  CBGs seem okay. not due for A1C yet. no changes for now.   Her updated medication list for this problem includes:    Bayer Childrens Aspirin 81 Mg Chew (Aspirin) .Marland Kitchen... Take 1 tablet by mouth once a day    Glipizide 10 Mg Tabs (Glipizide) ..... One tab by mouth two times a day for diabetes    Januvia 25 Mg Tabs (Sitagliptin phosphate) ..... One tab by  mouth daily for diabetes  Orders: [facility]- Est  Level 4 (91478)  Problem # 3:  TACHYCARDIA (ICD-785.0) Assessment: New  not issue in the past. etiologies include possible dehydration vs. anemia vs. hyperthyroidism. since isolated episode and asymptomatic, no work up for now. however, if persists, would check CBC, TSH, BMP, and EKG.   Orders: [facility]- Est  Level 4 (29562)  Problem # 4:  SOCIAL patient feels well and desires to resume work. note provided.   Patient Instructions: 1)  Follow up at [facility] in September about diabetes.

## 2010-09-25 NOTE — Consult Note (Signed)
Summary: [facility]  [facility]   Imported By: De Nurse 05/06/2010 16:16:22  _____________________________________________________________________  External Attachment:    Type:   Image     Comment:   External Document

## 2010-09-25 NOTE — Assessment & Plan Note (Signed)
Summary: F/U VISIT/[facility]   Vital Signs:  Patient profile:   77 year old female Weight:      139.2 pounds Temp:     98.5 degrees F oral Pulse rate:   86 / minute Pulse rhythm:   regular BP sitting:   213 / 94  (right arm) Cuff size:   regular  Vitals Entered By: Loralee Pacas CMA (October 17, 2009 2:44 PM)  Serial Vital Signs/Assessments:  Time      Position  BP       Pulse  Resp  Temp     By 2:45                182/100                        Loralee Pacas CMA                     190/100                        Lequita Asal  MD   Primary Care Provider:  Lequita Asal  MD  CC:  f/u DM and HTN.  History of Present Illness: 77 y/o female here for f/u chronic medical issues  DM- on glipizide and januvia. patient reports some elevated CBGs at home. left records at home. endorses polyuria, polydipsia. denies blurred vision.   HTN- patient supposed to be on clonidine, metoprolol, hydralazine. currently only taking metoprolol. denies chest pain, SOB, blurred vision, headache. some occasional peripheral edema  N/V- seen in ED 2/7. given fluids. prescription for Zofran never filled. symptoms resolved, and no longer present.   HLD- currently on lipitor.   Current Medications (verified): 1)  Bayer Childrens Aspirin 81 Mg Chew (Aspirin) .... Take 1 Tablet By Mouth Once A Day 2)  Glipizide 10 Mg Tabs (Glipizide) .... One Tab By Mouth Two Times A Day For Diabetes 3)  Lipitor 80 Mg Tabs (Atorvastatin Calcium) .... Take 1 Tablet By Mouth At Bedtime For Cholesterol 4)  Amlodipine Besylate 10 Mg  Tabs (Amlodipine Besylate) .... One Tab By Mouth Daily For Blood Pressure 5)  Accu-Chek Comfort Curve  Strp (Glucose Blood) .... Check Blood Sugar 3 Times Daily. Dispense 102 Strips. 6)  Catapres 0.3 Mg Tabs (Clonidine Hcl) .... One Tab By Mouth Two Times A Day For Blood Pressure 7)  Januvia 25 Mg Tabs (Sitagliptin Phosphate) .... One Tab By Mouth Daily For Diabetes 8)  Hydralazine Hcl 25 Mg  Tabs (Hydralazine Hcl) .... One Tab By Mouth Three Times A Day For Blood Pressure 9)  Metoprolol Tartrate 25 Mg Tabs (Metoprolol Tartrate) .... One Tab By Mouth Two Times A Day For Blood Pressure 10)  Zantac 150 Mg Caps (Ranitidine Hcl) .... One Tab By Mouth Two Times A Day For Stomach Ulcer 11)  Furosemide 80 Mg Tabs (Furosemide) .... One Tab By Mouth Daily  Allergies (verified): 1)  ! Demerol 2)  ! Codeine Sulfate (Codeine Sulfate)  Physical Exam  General:  Non ill appearing, A&O x3. vitals reviewed.  Mouth:  MMM Neck:  No deformities, masses, or tenderness noted. Lungs:  Normal respiratory effort, chest expands symmetrically. Lungs are clear to auscultation, no crackles or wheezes. Heart:  Normal rate and regular rhythm. S1 and S2 normal without gallop, murmur, click, rub or other extra sounds. Abdomen:  soft, NT, ND, +BS.  Pulses:  R and L carotid,radial,dorsalis pedis and posterior tibial  pulses are full and equal bilaterally Extremities:  1+ peripheral edema of BLE.    Impression & Recommendations:  Problem # 1:  DIABETES MELLITUS, TYPE II, UNCONTROLLED (ICD-250.02) Assessment Improved near goal A1C. continue current tx.   Her updated medication list for this problem includes:    Bayer Childrens Aspirin 81 Mg Chew (Aspirin) .Marland Kitchen... Take 1 tablet by mouth once a day    Glipizide 10 Mg Tabs (Glipizide) ..... One tab by mouth two times a day for diabetes    Januvia 25 Mg Tabs (Sitagliptin phosphate) ..... One tab by mouth daily for diabetes  Orders: A1C-[facility] (16109) [facility]- Est  Level 4 (60454)  Problem # 2:  ESSENTIAL HYPERTENSION, BENIGN (ICD-401.1) Assessment: Deteriorated  patient off meds. encouraged to refill. no symptoms of hypertensive emergency.   Her updated medication list for this problem includes:    Amlodipine Besylate 10 Mg Tabs (Amlodipine besylate) ..... One tab by mouth daily for blood pressure    Catapres 0.3 Mg Tabs (Clonidine hcl) ..... One tab by mouth  two times a day for blood pressure    Hydralazine Hcl 25 Mg Tabs (Hydralazine hcl) ..... One tab by mouth three times a day for blood pressure    Metoprolol Tartrate 25 Mg Tabs (Metoprolol tartrate) ..... One tab by mouth two times a day for blood pressure    Furosemide 80 Mg Tabs (Furosemide) ..... One tab by mouth daily  Orders: [facility]- Est  Level 4 (99214)  Problem # 3:  HYPERCHOLESTEROLEMIA, PURE (ICD-272.0) Assessment: Unchanged  continue lipitor.   Her updated medication list for this problem includes:    Lipitor 80 Mg Tabs (Atorvastatin calcium) .Marland Kitchen... Take 1 tablet by mouth at bedtime for cholesterol  Orders: [facility]- Est  Level 4 (99214)  Problem # 4:  CHRONIC KIDNEY DISEASE STAGE III (MODERATE) (ICD-585.3) Assessment: Unchanged per Dr. Lowell Guitar. will check CMP, CBC and forward labs.   Orders: Comp Met-[facility] [phone]) CBC-[facility] (29562) [facility]- Est  Level 4 (13086)  Patient Instructions: 1)  Please schedule nurse visit for VITAL SIGN CHECK in 4-6 weeks 2)  Follow up with Dr. Lanier Prude in May 3)  Schedule an appointment with Dr. Lowell Guitar for this Summer (June/July) Prescriptions: HYDRALAZINE HCL 25 MG TABS (HYDRALAZINE HCL) one tab by mouth three times a day FOR BLOOD PRESSURE  #90 x 5   Entered and Authorized by:   Lequita Asal  MD   Signed by:   Lequita Asal  MD on 10/17/2009   Method used:   Faxed to ...       [facility] (retail)       [address]       [place], [place]  [address]       Ph: [phone]       Fax: [phone]   RxID:   [id number] JANUVIA 25 MG TABS (SITAGLIPTIN PHOSPHATE) one tab by mouth daily FOR DIABETES  #30 x 5   Entered and Authorized by:   Lequita Asal  MD   Signed by:   Lequita Asal  MD on 10/17/2009   Method used:   Faxed to ...       [facility] (retail)       [address]       [place], [place]  [address]       Ph: [phone]       Fax: [phone]  RxID:    [id number] CATAPRES 0.3 MG TABS (CLONIDINE HCL) one tab by mouth two times a day FOR BLOOD PRESSURE  #60 x 5   Entered and Authorized by:   Lequita Asal  MD   Signed by:   Lequita Asal  MD on 10/17/2009   Method used:   Faxed to ...       [facility] (retail)       [address]       [place], [place]  [address]       Ph: [phone]       Fax: [phone]   RxID:   [id number] LIPITOR 80 MG TABS (ATORVASTATIN CALCIUM) Take 1 tablet by mouth at bedtime for cholesterol  #30 x 5   Entered and Authorized by:   Lequita Asal  MD   Signed by:   Lequita Asal  MD on 10/17/2009   Method used:   Faxed to ...       [facility] (retail)       [address]       [place], [place]  [address]       Ph: [phone]       Fax: [phone]   RxID:   [id number] BAYER CHILDRENS ASPIRIN 81 MG CHEW (ASPIRIN) Take 1 tablet by mouth once a day  #30 x 5   Entered and Authorized by:   Lequita Asal  MD   Signed by:   Lequita Asal  MD on 10/17/2009   Method used:   Faxed to ...       [facility] (retail)       [address]       [place], [place]  [address]       Ph: [phone]       Fax: [phone]   RxID:   [id number] GLIPIZIDE 10 MG TABS (GLIPIZIDE) one tab by mouth two times a day for DIABETES  #60 x 5   Entered and Authorized by:   Lequita Asal  MD   Signed by:   Lequita Asal  MD on 10/17/2009   Method used:   Faxed to ...       [facility] (retail)       [address]       [place], [place]  [address]       Ph: [phone]       Fax: [phone]   RxID:   [id number] AMLODIPINE BESYLATE 10 MG  TABS (AMLODIPINE BESYLATE) one tab by mouth daily for BLOOD PRESSURE  #30 x 5   Entered and Authorized by:   Lequita Asal  MD   Signed by:   Lequita Asal  MD on 10/17/2009   Method used:   Print then Give to  Patient   RxID:   [id number]   Laboratory Results   Blood Tests   Date/Time Received: October 17, 2009 2:54 PM  Date/Time Reported: October 17, 2009 3:39 PM   HGBA1C: 7.6%   (Normal Range: Non-Diabetic - 3-6%   Control Diabetic - 6-8%)  Comments: ...............test performed by............Marland KitchenLoralee Pacas, CMA .............entered by...........Marland KitchenBonnie A. [place], MLS (ASCP)cm       Prevention & Chronic Care Immunizations   Influenza vaccine: Fluvax 3+  (06/10/2007)   Influenza vaccine deferral:  Not available  (05/17/2009)   Influenza vaccine due: 06/09/2008    Tetanus booster: 06/10/2007: Tdap   Tetanus booster due: 06/09/2017    Pneumococcal vaccine: given  (06/27/2008)   Pneumococcal vaccine due: Not Indicated    H. zoster vaccine: Not documented   H. zoster vaccine deferral: Not available  (05/17/2009)  Colorectal Screening   Hemoccult: not indicated  (06/21/2008)   Hemoccult due: 06/21/2009    Colonoscopy: Done.  (05/24/2005)   Colonoscopy due: 05/25/2015  Other Screening   Pap smear: s/p hysterectom  (06/21/2008)   Pap smear action/deferral: hysterectomy  (07/20/2007)   Pap smear due: Not Indicated    Mammogram: ASSESSMENT: Negative - BI-RADS 1^MM DIGITAL SCREENING  (02/19/2009)   Mammogram due: 12/05/2008    DXA bone density scan: Done.  (04/24/2001)   DXA scan due: None    Smoking status: never  (09/05/2009)  Diabetes Mellitus   HgbA1C: 7.6  (10/17/2009)   Hemoglobin A1C due: 09/21/2008    Eye exam: normal  (08/24/2008)   Eye exam due: 08/2009    Foot exam: yes  (11/27/2008)   High risk foot: Not documented   Foot care education: completed  (06/21/2008)   Foot exam due: 06/21/2009    Urine microalbumin/creatinine ratio: Not documented   Urine microalbumin action/deferral: Not indicated   Urine microalbumin/cr due: 06/21/2009    Diabetes flowsheet reviewed?: Yes   Progress toward A1C goal: Improved  Lipids   Total Cholesterol:  129  (07/09/2008)   Lipid panel action/deferral: Not indicated   LDL: 53  (07/09/2008)   LDL Direct: 70  (06/20/2009)   HDL: 63  (07/09/2008)   Triglycerides: 65  (07/09/2008)    SGOT (AST): 14  (06/20/2009)   SGPT (ALT): 12  (06/20/2009) CMP ordered    Alkaline phosphatase: 100  (06/20/2009)   Total bilirubin: 0.3  (06/20/2009)    Lipid flowsheet reviewed?: Yes   Progress toward LDL goal: At goal  Hypertension   Last Blood Pressure: 213 / 94  (10/17/2009)   Serum creatinine: 2.41  (06/20/2009)   Serum potassium 4.6  (06/20/2009) CMP ordered     Hypertension flowsheet reviewed?: Yes   Progress toward BP goal: Deteriorated  Self-Management Support :   Personal Goals (by the next clinic visit) :     Personal A1C goal: 8  (05/17/2009)     Personal blood pressure goal: 130/80  (08/20/2009)     Personal LDL goal: 100  (05/17/2009)    Patient will work on the following items until the next clinic visit to reach self-care goals:     Medications and monitoring: take my medicines every day, check my blood sugar, check my blood pressure, bring all of my medications to every visit  (10/17/2009)     Eating: eat foods that are low in salt  (10/17/2009)     Activity: take a 30 minute walk every day  (10/17/2009)    Diabetes self-management support: Copy of home glucose meter record, CBG self-monitoring log, Written self-care plan  (10/17/2009)   Diabetes care plan printed   Last diabetes self-management training by diabetes educator: completed    Hypertension self-management support: BP self-monitoring log, Written self-care plan  (10/17/2009)   Hypertension self-care plan printed.    Lipid self-management support: Written self-care plan  (05/17/2009)     Lipid self-management support not done because: Good outcomes  (10/17/2009)

## 2010-09-25 NOTE — Consult Note (Signed)
Summary: [facility]  [facility]   Imported By: Bradly Bienenstock 01/29/2010 17:23:29  _____________________________________________________________________  External Attachment:    Type:   Image     Comment:   External Document

## 2010-09-25 NOTE — Letter (Signed)
Summary: Work Excuse  [facility]  [address]   [place], [place] [address]   Phone: [phone]  Fax: [phone]    Today's Date: February 11, 2010  Name of Patient: Claudia Rich  The above named patient had a medical visit today at:  4:30 pm.  Please take this into consideration when reviewing the time away from work  Special Instructions:  [ x ] None: [name] may return to work without restrictions.   [  ] To be off the remainder of today, returning to the normal work / school schedule tomorrow.  [  ] To be off until the next scheduled appointment on ______________________.  [  ] Other ________________________________________________________________ ________________________________________________________________________   Sincerely yours,   Lequita Asal  MD

## 2010-09-25 NOTE — Progress Notes (Signed)
Summary: phn msg  Phone Note Call from Patient Call back at [phone]   Caller: Patient Summary of Call: Wants to know what limitations she has for working.  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   Initial call taken by: Clydell Hakim,  August 29, 2009 4:27 PM  Follow-up for Phone Call        Forwarded to PCP Follow-up by: Gladstone Pih,  August 29, 2009 4:50 PM  Additional Follow-up for Phone Call Additional follow up Details #1::        there isn't anything specifically she should or should not do. would avoid lifting >10 lbs since she is elderly.  Additional Follow-up by: Lequita Asal  MD,  August 29, 2009 8:12 PM    Additional Follow-up for Phone Call Additional follow up Details #2::    Left message for her to return call to Misty Stanley or Tanya Follow-up by: Gladstone Pih,  August 30, 2009 8:44 AM  Additional Follow-up for Phone Call Additional follow up Details #3:: Details for Additional Follow-up Action Taken: Called and left a message on home number and call back number to call the office.  called and spoke with pt and told her that Dr. Lanier Prude suggested that she not lift anything greater than 10lbs, she agreed to this Additional Follow-up by: Loralee Pacas CMA,  September 03, 2009 3:35 PM

## 2010-09-25 NOTE — Consult Note (Signed)
Summary: [facility]  [facility]   Imported By: De Nurse 01/10/2010 15:55:36  _____________________________________________________________________  External Attachment:    Type:   Image     Comment:   External Document

## 2010-09-25 NOTE — Letter (Signed)
Summary: Generic Letter  [facility]  [address]   [place], [place] [address]   Phone: [phone]  Fax: [phone]    12/17/2009  Claudia Rich [address] [place], [place]  [address]  Dear [name],   It has been my pleasure to have been your physician over the last 3 years. I truly have enjoyed getting to know you and helping you with your healthcare needs. Over the next several weeks, you will be assigned a new provider at [facility]. He/She will have access to all of your records and continue to provide you with excellent care. If you have any questions, please feel free to contact our office.     Sincerely,   Lequita Asal  MD  Appended Document: Generic Letter mailed

## 2010-09-25 NOTE — Assessment & Plan Note (Signed)
Summary: TB TEST/KH  Nurse Visit   Allergies: 1)  ! Demerol 2)  ! Codeine Sulfate (Codeine Sulfate)  Immunizations Administered:  PPD Skin Test:    Vaccine Type: PPD    Site: right forearm    Mfr: Sanofi Pasteur    Dose: 0.1 ml    Route: ID    Given by: Theresia Lo RN    Exp. Date: 01/19/2012    Lot #: [id number]  Orders Added: 1)  TB Skin Test [86580] 2)  Admin 1st Vaccine [phone]

## 2010-09-25 NOTE — Consult Note (Signed)
Summary: [facility]  [facility]   Imported By: Bradly Bienenstock 09/02/2010 11:04:06  _____________________________________________________________________  External Attachment:    Type:   Image     Comment:   External Document

## 2010-09-25 NOTE — Assessment & Plan Note (Signed)
Summary: geriatric assessment   Vital Signs:  Patient profile:   77 year old female Height:      64.25 inches Weight:      130 pounds BMI:     22.22 BSA:     1.63 Temp:     98.0 degrees F Pulse rate:   66 / minute BP sitting:   151 / 72  Vitals Entered By: Jone Baseman CMA (September 05, 2009 10:37 AM) CC: f/u ED Is Patient Diabetic? Yes Did you bring your meter with you today? No Pain Assessment Patient in pain? no        Primary Care Provider:  Lequita Asal  MD  CC:  f/u ED.  History of Present Illness: 77 y/o female presents for f/u ED visit 1/10 for N/V and for full geriatric assessment.  N/V- states started out of nowhere. vomited up sour "orange-looking" stuff. reports not receiving anything in ED (fluids, medications, etc). just had labs done. resolved, but then recurred  yesterday. emesis with "black liquid" yesterday. denies any foods that may have been that color. h/o PUD in 57s. only taking baby aspirin. no other NSAIDs, goody powders, BC powders, etc. Denies diarrhea, constipation, melena, hematochezia, dysuria, fever, chills.   weight loss- 8 lbs weight loss since last visit. given furosemide 80 mg by mouth two times a day by nephrologist. endorses polyuria. denies decreased appetite.   Habits & Providers  Alcohol-Tobacco-Diet     Tobacco Status: never     Tobacco Counseling: not indicated; no tobacco use  Current Medications (verified): 1)  Bayer Childrens Aspirin 81 Mg Chew (Aspirin) .... Take 1 Tablet By Mouth Once A Day 2)  Glipizide 10 Mg Tabs (Glipizide) .... One Tab By Mouth Two Times A Day For Diabetes 3)  Lipitor 80 Mg Tabs (Atorvastatin Calcium) .... Take 1 Tablet By Mouth At Bedtime For Cholesterol 4)  Amlodipine Besylate 10 Mg  Tabs (Amlodipine Besylate) .... One Tab By Mouth Daily For Blood Pressure 5)  Accu-Chek Comfort Curve  Strp (Glucose Blood) .... Check Blood Sugar 3 Times Daily. Dispense 102 Strips. 6)  Catapres 0.3 Mg Tabs  (Clonidine Hcl) .... One Tab By Mouth Two Times A Day For Blood Pressure 7)  Januvia 25 Mg Tabs (Sitagliptin Phosphate) .... One Tab By Mouth Daily For Diabetes 8)  Hydralazine Hcl 25 Mg Tabs (Hydralazine Hcl) .... One Tab By Mouth Three Times A Day For Blood Pressure 9)  Metoprolol Tartrate 25 Mg Tabs (Metoprolol Tartrate) .... One Tab By Mouth Two Times A Day For Blood Pressure 10)  Zantac 150 Mg Caps (Ranitidine Hcl) .... One Tab By Mouth Two Times A Day For Stomach Ulcer  Allergies (verified): 1)  ! Demerol 2)  ! Codeine Sulfate (Codeine Sulfate)  Past History:  Past medical, surgical, family and social histories (including risk factors) reviewed, and no changes noted (except as noted below).  Past Medical History: Reviewed history from 07/01/2009 and no changes required. - HTN -Hypercholesterolemia -GERD -DM -CKD :  Cr baseline:2.1-2.4,  Microalbuminuria 2 - hammertoe  deformity, rt foot -Glaucoma  Past Surgical History: Reviewed history from 06/20/2008 and no changes required. Colon bx (hyperplastic polyps) - 05/24/2005,  Colonoscopy / bx (sessile polyps,sigmoid diverticula, int. hemorrhoids) - 05/24/2005,  colonoscopy:polyps ( adenomatous, and mass; tics (11/02) - 07/27/2001  Polypectomy - 07/24/2001   Hysterectomy & BSO, no CA -  cataract extraction (bilateral) Pseudophakia.   Family History: Reviewed history from 11/08/2007 and no changes required. No colorectal CA sister-cancer sister-  CKD (on dialysis)  Social History: Reviewed history from 06/26/2008 and no changes required. widowed in 16s. no children. lives alone in [place], no h/o Tobacco, no etoh, drugs. has nephews/nieces who live locally. involved in church. another sister in [place]. No children.  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 Still drives  Physical Exam  General:  Non ill appearing, A&O x3. vitals reviewed.  Mouth:  MMM Lungs:  Normal respiratory effort, chest expands  symmetrically. Lungs are clear to auscultation, no crackles or wheezes. Heart:  Normal rate and regular rhythm. S1 and S2 normal without gallop, murmur, click, rub or other extra sounds. Abdomen:  soft, NT, ND, +BS.  Extremities:  trace edema of bilateral lower extremities.   Geriatric Assessment:  Activities of Daily Living:    Bathing-independent    Dressing-independent    Eating-independent    Toileting-independent    Transferring-independent    Continence-independent  Instrumental Activities of Daily Living:    Transportation-independent    Meal/Food Preparation-independent    Shopping Errands-independent    Housekeeping/Chores-independent    Money Management/Finances-independent    Medication Management-independent    Ability to Use Telephone-independent    Laundry-independent  Mental Status Exam: (value/max value)    Orientation to Time: 5/5    Orientation to Place: 5/5    Registration: 3/3    Attention/Calculation: 5/5    Recall: 2/3    Language-name 2 objects: 2/2    Language-repeat: 1/1    Language-follow 3-step command: 3/3    Language-read and follow direction: 1/1    Write a sentence: 1/1    Copy design: 1/1 MSE Total score: 29/30  Geriatric Depression Score: (value/max value)    not indicated  Balance: (value/max value)    Sitting balance: 1/1    Arises: 2/2    Attempts to arise: 2/2    Immediate standing balance: 2/2    Standing balance: 1/1    Nudged: 2/2    Eyes closed: 1/1    360 degree turn: 1/1    Sitting down: 2/2 Balance Total Score: 14/14  Gait: (value/max value)    Initiation of gait: 1/1    Step length-left: 1/1    Step height-left: 1/1    Step length-right: 1/1    Step height-right: 1/1    Step symmetry: 1/1    Step continuity: 1/1    Path: 2/2    Trunk: 2/2    Walking stance: 1/1 Gait Total Score: 12/12  Balance + Gait Total Score: 26/26   Impression & Recommendations:  Problem # 1:  NAUSEA AND VOMITING  (ICD-787.01) Assessment New  possibly secondary to gastritis. possible coffeeground emesis. hgb stable. h/o PUD. H. pylori positive but uncertain of whether has been treated in the past. will discuss at next visit possible need for triple therapy (given cost)  Orders: [facility]- Est  Level 4 (16109)  Problem # 2:  LOSS OF WEIGHT (ICD-783.21) Assessment: Deteriorated  likely secondary to aggressive diuresis. will monitor.   Orders: [facility]- Est  Level 4 (60454)  Problem # 3:  ESSENTIAL HYPERTENSION, BENIGN (ICD-401.1) Assessment: Improved  continue current medications, except change to short acting beta blocker 2/2 cost.   Her updated medication list for this problem includes:    Amlodipine Besylate 10 Mg Tabs (Amlodipine besylate) ..... One tab by mouth daily for blood pressure    Catapres 0.3 Mg Tabs (Clonidine hcl) ..... One tab by mouth two times a day for blood pressure    Hydralazine Hcl 25 Mg Tabs (  Hydralazine hcl) ..... One tab by mouth three times a day for blood pressure    Metoprolol Tartrate 25 Mg Tabs (Metoprolol tartrate) ..... One tab by mouth two times a day for blood pressure  Orders: [facility]- Est  Level 4 (99214)  Problem # 4:  P U D (ICD-533.90) Assessment: Unchanged possibly deteriorated. start on H2 blocker.   Her updated medication list for this problem includes:    Zantac 150 Mg Caps (Ranitidine hcl) ..... One tab by mouth two times a day for stomach ulcer  Orders: H pylori-[facility] (57846) [facility]- Est  Level 4 (96295)  Other Orders: Hemoglobin-[facility] (28413)  Patient Instructions: 1)  We are going to add ZANTAC to protect your stomach from ulcers 2)  When you finish your TOPROL XL bottle, pick up NEW prescription for METOPROLOL 25 mg one tablet TWICE A DAY 3)  Follow up with Dr. Lanier Prude on FEBRUARY 24, at 1:55 PM Prescriptions: ZANTAC 150 MG CAPS (RANITIDINE HCL) one tab by mouth two times a day for STOMACH ULCER  #60 x 3   Entered and Authorized by:   Lequita Asal   MD   Signed by:   Lequita Asal  MD on 09/05/2009   Method used:   Faxed to ...       [facility] (retail)       [address]       [place], [place]  [address]       Ph: [phone]       Fax: [phone]   RxID:   [id number] METOPROLOL TARTRATE 25 MG TABS (METOPROLOL TARTRATE) one tab by mouth two times a day for BLOOD PRESSURE  #60 x 3   Entered and Authorized by:   Lequita Asal  MD   Signed by:   Lequita Asal  MD on 09/05/2009   Method used:   Faxed to ...       [facility] (retail)       [address]       [place], [place]  [address]       Ph: [phone]       Fax: [phone]   RxID:   [id number]   Laboratory Results   Blood Tests   Date/Time Received: September 05, 2009 11:48 AM  Date/Time Reported: September 05, 2009 12:03 PM    H. pylori: positive  CBC   HGB:  12.3 g/dL   (Normal Range: 16.0-73.7 in Males, 12.0-15.0 in Females) Comments: ...........test performed by...........Marland KitchenAshlee Dais SMA       Prevention & Chronic Care Immunizations   Influenza vaccine: Fluvax 3+  (06/10/2007)   Influenza vaccine deferral: Not available  (05/17/2009)   Influenza vaccine due: 06/09/2008    Tetanus booster: 06/10/2007: Tdap   Tetanus booster due: 06/09/2017    Pneumococcal vaccine: given  (06/27/2008)   Pneumococcal vaccine due: Not Indicated    H. zoster vaccine: Not documented   H. zoster vaccine deferral: Not available  (05/17/2009)  Colorectal Screening   Hemoccult: not indicated  (06/21/2008)   Hemoccult due: 06/21/2009    Colonoscopy: Done.  (05/24/2005)   Colonoscopy due: 05/25/2015  Other Screening   Pap smear: s/p hysterectom  (06/21/2008)   Pap smear action/deferral: hysterectomy  (07/20/2007)   Pap smear due: Not Indicated    Mammogram: ASSESSMENT: Negative - BI-RADS 1^MM DIGITAL SCREENING  (02/19/2009)   Mammogram due: 12/05/2008  DXA bone density scan:  Done.  (04/24/2001)   DXA scan due: None    Smoking status: never  (09/05/2009)  Diabetes Mellitus   HgbA1C: 9.3  (06/20/2009)   Hemoglobin A1C due: 09/21/2008    Eye exam: normal  (08/24/2008)   Eye exam due: 08/2009    Foot exam: yes  (11/27/2008)   High risk foot: Not documented   Foot care education: completed  (06/21/2008)   Foot exam due: 06/21/2009    Urine microalbumin/creatinine ratio: Not documented   Urine microalbumin action/deferral: Not indicated   Urine microalbumin/cr due: 06/21/2009  Lipids   Total Cholesterol: 129  (07/09/2008)   Lipid panel action/deferral: Not indicated   LDL: 53  (07/09/2008)   LDL Direct: 70  (06/20/2009)   HDL: 63  (07/09/2008)   Triglycerides: 65  (07/09/2008)    SGOT (AST): 14  (06/20/2009)   SGPT (ALT): 12  (06/20/2009)   Alkaline phosphatase: 100  (06/20/2009)   Total bilirubin: 0.3  (06/20/2009)    Lipid flowsheet reviewed?: Yes   Progress toward LDL goal: At goal  Hypertension   Last Blood Pressure: 151 / 72  (09/05/2009)   Serum creatinine: 2.41  (06/20/2009)   Serum potassium 4.6  (06/20/2009)    Hypertension flowsheet reviewed?: Yes   Progress toward BP goal: Improved  Self-Management Support :   Personal Goals (by the next clinic visit) :     Personal A1C goal: 8  (05/17/2009)     Personal blood pressure goal: 130/80  (08/20/2009)     Personal LDL goal: 100  (05/17/2009)    Patient will work on the following items until the next clinic visit to reach self-care goals:     Medications and monitoring: take my medicines every day, check my blood sugar, check my blood pressure, bring all of my medications to every visit  (09/05/2009)     Eating: eat foods that are low in salt  (09/05/2009)     Activity: take a 30 minute walk every day  (09/05/2009)    Diabetes self-management support: Copy of home glucose meter record, CBG self-monitoring log, Written self-care plan  (09/05/2009)   Diabetes care plan printed    Last diabetes self-management training by diabetes educator: completed    Hypertension self-management support: BP self-monitoring log, Written self-care plan  (09/05/2009)   Hypertension self-care plan printed.    Lipid self-management support: Written self-care plan  (05/17/2009)     Lipid self-management support not done because: Good outcomes  (09/05/2009)
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
## 2010-09-25 NOTE — Progress Notes (Signed)
Summary: needs note  Phone Note Call from Patient Call back at Home Phone [phone]   Caller: Patient Summary of Call: needs a note to go back to work Initial call taken by: De Nurse,  Jan 07, 2010 8:50 AM  Follow-up for Phone Call        will forward to pcp Follow-up by: Loralee Pacas CMA,  Jan 07, 2010 11:48 AM  Additional Follow-up for Phone Call Additional follow up Details #1::        called and informed pt that she will need to make an appt to be evlauated. pt will call and make appt tomorrow Additional Follow-up by: Loralee Pacas CMA,  Jan 07, 2010 5:24 PM

## 2010-09-25 NOTE — Consult Note (Signed)
Summary: [facility]  [facility]   Imported By: De Nurse 09/09/2009 11:34:30  _____________________________________________________________________  External Attachment:    Type:   Image     Comment:   External Document

## 2010-09-25 NOTE — Assessment & Plan Note (Signed)
Summary: VITALS CHECK/KH  Nurse Visit BP checked manually using regular adult cuff. BP  RA  150/76 pulse 56. Theresia Lo RN  November 19, 2009 3:41 PM   Allergies: 1)  ! Demerol 2)  ! Codeine Sulfate (Codeine Sulfate)  Orders Added: 1)  No Charge Patient Arrived (NCPA0) [NCPA0]

## 2010-09-25 NOTE — Miscellaneous (Signed)
Summary: SUMMARY  Well-functioning 77 y/o female who was still working up till this year! Had complete geriatric assessment in January 2011    Impression & Recommendations:  Problem # 1:  ESSENTIAL HYPERTENSION, BENIGN (ICD-401.1) Assessment Comment Only difficult to control 2/2 kidney disease. occasionally runs out and doesn't obtains refills for some reason. could consider going to three times a day on clonidine or adding HCTZ (was previously on diovan hct- not sure why HCT component was stopped during a hospitalization).   Her updated medication list for this problem includes:    Amlodipine Besylate 10 Mg Tabs (Amlodipine besylate) ..... One tab by mouth daily for blood pressure    Catapres 0.3 Mg Tabs (Clonidine hcl) ..... One tab by mouth two times a day for blood pressure    Hydralazine Hcl 25 Mg Tabs (Hydralazine hcl) ..... One tab by mouth three times a day for blood pressure    Metoprolol Tartrate 25 Mg Tabs (Metoprolol tartrate) ..... One tab by mouth two times a day for blood pressure    Furosemide 80 Mg Tabs (Furosemide) ..... One tab by mouth daily  Prior BP: 176/65 (01/08/2010)  Prior 10 Yr Risk Heart Disease: Not enough information (06/20/2008)  Labs Reviewed: K+: 4.3 (01/08/2010) Creat: : 3.16 (01/08/2010)   Chol: 129 (07/09/2008)   HDL: 63 (07/09/2008)   LDL: 53 (07/09/2008)   TG: 65 (07/09/2008)  Problem # 2:  CHRONIC KIDNEY DISEASE STAGE III (MODERATE) (ICD-585.3) Assessment: Comment Only followed by Dr. Lowell Guitar of [facility]   Problem # 3:  DIABETES MELLITUS, TYPE II, UNCONTROLLED (ICD-250.02) Assessment: Comment Only off metformin 2/2 CKD. occasionally unable to afford [place], but has new rx plan. check CBGs sometimes, but often forgets logbook. could consider going up to 20 mg two times a day of glipizide.   Her updated medication list for this problem includes:    Bayer Childrens Aspirin 81 Mg Chew (Aspirin) .Marland Kitchen... Take 1 tablet by mouth once a day  Glipizide 10 Mg Tabs (Glipizide) ..... One tab by mouth two times a day for diabetes    Januvia 25 Mg Tabs (Sitagliptin phosphate) ..... One tab by mouth daily for diabetes  Labs Reviewed: Creat: 3.16 (01/08/2010)   Microalbumin: pt with known stage 3 kidney disease   Last Eye Exam: normal (08/24/2008) Reviewed HgBA1c results: 8.0 (01/08/2010)  7.6 (10/17/2009)  Problem # 4:  G E R D (ICD-530.81) Assessment: Comment Only  Her updated medication list for this problem includes:    Zantac 150 Mg Caps (Ranitidine hcl) ..... One tab by mouth two times a day for stomach ulcer  Problem # 5:  HYPERCHOLESTEROLEMIA, PURE (ICD-272.0) Assessment: Comment Only  on lipitor. direct LDL 70 in October 2010  Her updated medication list for this problem includes:    Lipitor 80 Mg Tabs (Atorvastatin calcium) .Marland Kitchen... Take 1 tablet by mouth at bedtime for cholesterol  Labs Reviewed: SGOT: 15 (01/08/2010)   SGPT: 14 (01/08/2010)  Prior 10 Yr Risk Heart Disease: Not enough information (06/20/2008)   HDL:63 (07/09/2008)  LDL:53 (07/09/2008)  Chol:129 (07/09/2008)  Trig:65 (07/09/2008)

## 2010-09-25 NOTE — Consult Note (Signed)
Summary: [facility]  [facility]   Imported By: Clydell Hakim 05/15/2010 15:08:41  _____________________________________________________________________  External Attachment:    Type:   Image     Comment:   External Document

## 2010-09-25 NOTE — Progress Notes (Signed)
Summary: x ray results for cough  Phone Note Outgoing Call   Call placed by: Magnus Ivan MD,  Jan 10, 2010 3:48 PM Reason for Call: Discuss lab or test results Summary of Call: Called patient and informed her of negative findings on chest x ray.  Patient asked if there was anything she could use to help cough.  Asked if she had tried over the counter cough drops to relieve symptoms and she said yes.  I advised her to continue with the cough drops and if her symptoms got worse to come back to the [facility] or go to the ED.

## 2010-09-25 NOTE — Assessment & Plan Note (Signed)
Summary: f/up,tcb   Vital Signs:  Patient profile:   77 year old female Height:      64.25 inches Weight:      135 pounds BMI:     23.08 BSA:     1.66 Temp:     98.6 degrees F Pulse rate:   59 / minute BP sitting:   137 / 71  Vitals Entered By: Jone Baseman CMA (May 07, 2010 1:56 PM) CC: f/u Is Patient Diabetic? Yes Pain Assessment Patient in pain? no        Primary Care Provider:  Edd Arbour  CC:  f/u.  History of Present Illness: Patient presents for follow-up  DIABETES Meds: glipizide and [place] Taking and tolerating? yes Blood sugars: did not bring log. Hypoglycemic symptoms: no Visual problems:no Monitoring feet: yes Numbness/Tingling:no Last eye exam: Last A1c: 8.0  HYPERTENSION Meds: Taking and tolerating? yes although history of noncompliance Home BP's: no Chest Pain: no Dyspnea: no  CKD:  has been attending dialysis education classes.  Patient preparing for dialysis in the future.  no graft yet.  See notes from nephrology.   Habits & Providers  Alcohol-Tobacco-Diet     Tobacco Status: never     Tobacco Counseling: not indicated; no tobacco use  Allergies: 1)  ! Demerol 2)  ! Codeine Sulfate (Codeine Sulfate) PMH-FH-SH reviewed for relevance  Review of Systems      See HPI  Physical Exam  General:  Vital signs noted.  AxO, NAD. Lungs:  Normal respiratory effort, chest expands symmetrically. Lungs are clear to auscultation, no crackles or wheezes. Heart:  Normal rate and regular rhythm. S1 and S2 normal without gallop, murmur, click, rub or other extra sounds. Extremities:  no edema of BLE   Impression & Recommendations:  Problem # 1:  DIABETES MELLITUS, TYPE II, UNCONTROLLED (ICD-250.02) A1c deteriorated.  iIncreased glipizide today.  Patient declines need for refreshed in diabetes education.  Will follow-up with PCP in 1-2 months.   Her updated medication list for this problem includes:    Bayer Childrens Aspirin  81 Mg Chew (Aspirin) .Marland Kitchen... Take 1 tablet by mouth once a day    Glipizide 10 Mg Tabs (Glipizide) .Marland Kitchen..Marland Kitchen Two tab by mouth two times a day for diabetes    Januvia 25 Mg Tabs (Sitagliptin phosphate) ..... One tab by mouth daily for diabetes  Orders: A1C-[facility] (00938) [facility]- Est  Level 4 (18299)  Labs Reviewed: Creat: 3.16 (01/08/2010)   Microalbumin: pt with known stage 3 kidney disease on ACE-I and ARB (06/21/2008)  Last Eye Exam: normal (08/24/2008) Reviewed HgBA1c results: 8.4 (05/07/2010)  8.0 (01/08/2010)  Problem # 2:  ESSENTIAL HYPERTENSION, BENIGN (ICD-401.1)  On many agents, relatively well controlled today.  On meds as below  Her updated medication list for this problem includes:    Amlodipine Besylate 10 Mg Tabs (Amlodipine besylate) ..... One tab by mouth daily for blood pressure    Catapres 0.3 Mg Tabs (Clonidine hcl) ..... One tab by mouth two times a day for blood pressure    Hydralazine Hcl 25 Mg Tabs (Hydralazine hcl) ..... One tab by mouth three times a day for blood pressure    Metoprolol Tartrate 25 Mg Tabs (Metoprolol tartrate) ..... One tab by mouth two times a day for blood pressure    Furosemide 80 Mg Tabs (Furosemide) ..... One tab by mouth daily  BP today: 137/71 Prior BP: 168/90 (02/11/2010)  Prior 10 Yr Risk Heart Disease: Not enough information (06/20/2008)  Labs Reviewed:  K+: 4.3 (01/08/2010) Creat: : 3.16 (01/08/2010)   Chol: 129 (07/09/2008)   HDL: 63 (07/09/2008)   LDL: 53 (07/09/2008)   TG: 65 (07/09/2008)  Orders: [facility]- Est  Level 4 (99214)  Problem # 3:  CHRONIC KIDNEY DISEASE STAGE IV (SEVERE) (ICD-585.4)  Patient preparing for hemodyalysis per nephrology.    Orders: [facility]- Est  Level 4 (16109)  Complete Medication List: 1)  Bayer Childrens Aspirin 81 Mg Chew (Aspirin) .... Take 1 tablet by mouth once a day 2)  Glipizide 10 Mg Tabs (Glipizide) .... Two tab by mouth two times a day for diabetes 3)  Lipitor 80 Mg Tabs (Atorvastatin calcium) ....  Take 1 tablet by mouth at bedtime for cholesterol 4)  Amlodipine Besylate 10 Mg Tabs (Amlodipine besylate) .... One tab by mouth daily for blood pressure 5)  Accu-chek Comfort Curve Strp (Glucose blood) .... Check blood sugar 3 times daily. dispense 102 strips. 6)  Catapres 0.3 Mg Tabs (Clonidine hcl) .... One tab by mouth two times a day for blood pressure 7)  Januvia 25 Mg Tabs (Sitagliptin phosphate) .... One tab by mouth daily for diabetes 8)  Hydralazine Hcl 25 Mg Tabs (Hydralazine hcl) .... One tab by mouth three times a day for blood pressure 9)  Metoprolol Tartrate 25 Mg Tabs (Metoprolol tartrate) .... One tab by mouth two times a day for blood pressure 10)  Zantac 150 Mg Caps (Ranitidine hcl) .... One tab by mouth two times a day for stomach ulcer 11)  Furosemide 80 Mg Tabs (Furosemide) .... One tab by mouth daily 12)  Ondansetron Hcl 4 Mg Tabs (Ondansetron hcl) .... Take one tablet every 8 hours as needed for nausea and vomiting  Patient Instructions: 1)  You still have refills on your medicines.  When you run out, call yoru pharmacists and they will call us for refills if needed. 2)  I have incerased your glipizide to 2 tabs twice daily 3)  Make appointment to meet your new doctor in 1-2 months. Prescriptions: GLIPIZIDE 10 MG TABS (GLIPIZIDE) two tab by mouth two times a day for DIABETES  #60 x 0   Entered and Authorized by:   Delbert Harness MD   Signed by:   Delbert Harness MD on 05/12/2010   Method used:   Faxed to ...       [facility] (retail)       [address]       [place], [place]  [address]       Ph: [phone]       Fax: [phone]   RxID:   [id number]   Laboratory Results   Blood Tests   Date/Time Received: May 07, 2010 1:43 PM  Date/Time Reported: May 07, 2010 1:57 PM   HGBA1C: 8.4%   (Normal Range: Non-Diabetic - 3-6%   Control Diabetic - 6-8%)  Comments: ...............test performed by......Marland KitchenBonnie A.  [place], MLS (ASCP)cm

## 2010-09-25 NOTE — Assessment & Plan Note (Signed)
Summary: READ PPD/KH  Nurse Visit   Allergies: 1)  ! Demerol 2)  ! Codeine Sulfate (Codeine Sulfate)  PPD Results    Date of reading: 10/03/2009    Results: 0 mm    Interpretation: negative  Orders Added: 1)  No Charge Patient Arrived (NCPA0) [NCPA0]

## 2010-09-26 NOTE — Procedures (Unsigned)
CEPHALIC VEIN MAPPING  INDICATION:  Chronic kidney disease stage 4.  HISTORY:  EXAM: The right cephalic vein is compressible with diameter measurements ranging from 0.21 to 0.44 cm.  The right basilic vein is compressible with diameter measurements ranging from 0.43 to 0.58 cm.  The left cephalic vein is compressible with diameter measurements ranging from 0.27 to 0.42 cm.  The left basilic vein is compressible with diameter measurements ranging 0.22 to 0.46 cm.  See attached worksheet for all measurements.  IMPRESSION:  Patent bilateral cephalic and basilic veins with diameter measurements as described above.  ___________________________________________ Janetta Hora. Fields, MD  CH/MEDQ  D:  09/19/2010  T:  09/19/2010  Job:  [id number]

## 2010-09-27 NOTE — Op Note (Signed)
  [name], Claudia Rich                  ACCOUNT NO.:  [id number]  MEDICAL RECORD NO.:  [id number]           PATIENT TYPE:  I  LOCATION:  6707                         FACILITY:  [facility]  PHYSICIAN:  Janetta Hora. Sarajane Fambrough, MD  DATE OF BIRTH:  [birth year]  DATE OF PROCEDURE:  09/24/2010 DATE OF DISCHARGE:                              OPERATIVE REPORT   PROCEDURE:  Left brachiocephalic arteriovenous fistula.  PREOPERATIVE DIAGNOSIS:  End-stage renal disease.  POSTOPERATIVE DIAGNOSIS:  End-stage renal disease.  ANESTHESIA:  Local with IV sedation.  ASSISTANT:  Pecola Leisure, PA-C  OPERATIVE FINDINGS:  A 2.5-3 mm left cephalic vein.  OPERATIVE DETAILS:  After obtaining informed consent, the patient was taken to the operating room. The patient placed in supine position on operating table. After adequate sedation, the patient's entire left upper extremity prepped and draped in usual sterile fashion.  Local anesthesia was infiltrated near the left antecubital crease.  A transverse incision was made in this location, carried down through subcutaneous tissues down the level of the cephalic vein.  Cephalic vein had multiple side branches, which were approximately 2.5-3 mm in diameter.  This was dissected free circumferentially for several centimeters.  Next, the brachial artery was dissected free in the medial portion incision.  The brachial artery was approximately 3.5 mm in diameter but thickened.  This was dissected free circumferentially, and the patient was given 5000 units of intravenous heparin.  The distal cephalic vein was ligated with 2-0 silk tie and transected.  The vein was then swung over the level of the artery and gently distended with heparinized saline.  Vein was marked for orientation.  Vessel loops were used to control the artery proximally and distally.  Longitudinal opening was made in the artery, and the vein was sewn end of vein to side of artery using a running 7-0  Prolene suture.  Just prior to completion of anastomosis, it was forebled, backbled, and thoroughly flushed.  Anastomosis was secured.  Vessel loops were released.  Two additional repair sutures were placed and then there was hemostasis. There was easily palpable thrill in the fistula.  The patient also had a palpable radial pulse.  After hemostasis was obtained, subcutaneous tissues were reapproximated using running 3-0 Vicryl suture.  Skin was closed with 4-0 Vicryl subcuticular stitch.  The patient tolerated the procedure well, and there were no complications. Sponge and needle counts were correct at the end of the case.  The patient was taken to recovery room in stable condition.     Janetta Hora. Kenyana Husak, MD     CEF/MEDQ  D:  09/24/2010  T:  09/25/2010  Job:  [id number]  Electronically Signed by Fabienne Bruns MD on 09/26/2010 01:11:53 PM

## 2010-10-01 ENCOUNTER — Other Ambulatory Visit: Payer: Self-pay

## 2010-10-01 ENCOUNTER — Encounter (HOSPITAL_COMMUNITY): Payer: Medicare FFS | Attending: Nephrology

## 2010-10-01 DIAGNOSIS — D638 Anemia in other chronic diseases classified elsewhere: Secondary | ICD-10-CM | POA: Insufficient documentation

## 2010-10-01 DIAGNOSIS — N184 Chronic kidney disease, stage 4 (severe): Secondary | ICD-10-CM | POA: Insufficient documentation

## 2010-10-02 LAB — POCT HEMOGLOBIN-HEMACUE: Hemoglobin: 9.5 g/dL — ABNORMAL LOW (ref 12.0–15.0)

## 2010-10-15 ENCOUNTER — Other Ambulatory Visit: Payer: Self-pay

## 2010-10-15 ENCOUNTER — Encounter (HOSPITAL_COMMUNITY): Payer: Medicare FFS

## 2010-10-15 ENCOUNTER — Encounter (HOSPITAL_COMMUNITY): Payer: 59

## 2010-10-23 ENCOUNTER — Ambulatory Visit (INDEPENDENT_AMBULATORY_CARE_PROVIDER_SITE_OTHER): Payer: 59 | Admitting: Vascular Surgery

## 2010-10-23 DIAGNOSIS — N186 End stage renal disease: Secondary | ICD-10-CM

## 2010-10-23 DIAGNOSIS — I219 Acute myocardial infarction, unspecified: Secondary | ICD-10-CM

## 2010-10-23 DIAGNOSIS — I639 Cerebral infarction, unspecified: Secondary | ICD-10-CM

## 2010-10-23 HISTORY — DX: Acute myocardial infarction, unspecified: I21.9

## 2010-10-23 HISTORY — DX: Cerebral infarction, unspecified: I63.9

## 2010-10-24 NOTE — Assessment & Plan Note (Signed)
OFFICE VISIT  Schrieber, Ashyla R DOB:  Jun 06, 1927                                       10/23/2010 ZOXWR#:[id number]  The patient returns for followup today.  She had a left brachiocephalic AV fistula placed approximately 1 month ago.  She returns today for further followup.  She denies any symptoms of numbness, tingling or aching in her left hand.  She has no symptoms of steal.  PHYSICAL EXAM:  Blood pressure is 197/77 in the right arm, heart rate 76 and regular.  Temperature is 97.8.  Left upper extremity there is an easily palpable brachiocephalic AV fistula which is palpable from the antecubital area all the way up to the level of the shoulder.  There is an easily palpable thrill throughout the fistula.  The fistula is between 4 mm and 6 mm in diameter at this point.  I believe the patient's fistula should continue to mature and should be a good durable access for her.  It should be ready for cannulation in 6- 8 weeks.  She has an appointment scheduled with Dr. Lowell Guitar in the near future.  He will contact me if he has any further questions regarding her access.    Janetta Hora. Daxtyn Rottenberg, MD Electronically Signed  CEF/MEDQ  D:  10/23/2010  T:  10/24/2010  Job:  [id number]  cc:   Mindi Slicker. Lowell Guitar, M.D.

## 2010-10-29 ENCOUNTER — Encounter (HOSPITAL_COMMUNITY): Payer: 59

## 2010-11-03 ENCOUNTER — Encounter (HOSPITAL_COMMUNITY): Payer: 59

## 2010-11-04 ENCOUNTER — Encounter: Payer: Self-pay | Admitting: Family Medicine

## 2010-11-04 ENCOUNTER — Inpatient Hospital Stay (HOSPITAL_COMMUNITY)
Admission: EM | Admit: 2010-11-04 | Discharge: 2010-11-15 | DRG: 069 | Disposition: A | Discharge status: Skilled Nursing Facility | Payer: Medicare HMO | Attending: Family Medicine | Admitting: Family Medicine

## 2010-11-04 ENCOUNTER — Emergency Department (HOSPITAL_COMMUNITY): Payer: Medicare HMO

## 2010-11-04 DIAGNOSIS — R5381 Other malaise: Secondary | ICD-10-CM | POA: Diagnosis present

## 2010-11-04 DIAGNOSIS — N186 End stage renal disease: Secondary | ICD-10-CM | POA: Diagnosis present

## 2010-11-04 DIAGNOSIS — I120 Hypertensive chronic kidney disease with stage 5 chronic kidney disease or end stage renal disease: Secondary | ICD-10-CM | POA: Diagnosis present

## 2010-11-04 DIAGNOSIS — K219 Gastro-esophageal reflux disease without esophagitis: Secondary | ICD-10-CM | POA: Diagnosis present

## 2010-11-04 DIAGNOSIS — Z66 Do not resuscitate: Secondary | ICD-10-CM | POA: Diagnosis present

## 2010-11-04 DIAGNOSIS — N179 Acute kidney failure, unspecified: Secondary | ICD-10-CM | POA: Diagnosis present

## 2010-11-04 DIAGNOSIS — R0902 Hypoxemia: Secondary | ICD-10-CM | POA: Diagnosis present

## 2010-11-04 DIAGNOSIS — E1169 Type 2 diabetes mellitus with other specified complication: Secondary | ICD-10-CM | POA: Diagnosis present

## 2010-11-04 DIAGNOSIS — E870 Hyperosmolality and hypernatremia: Secondary | ICD-10-CM | POA: Diagnosis present

## 2010-11-04 DIAGNOSIS — R404 Transient alteration of awareness: Secondary | ICD-10-CM

## 2010-11-04 DIAGNOSIS — I10 Essential (primary) hypertension: Secondary | ICD-10-CM

## 2010-11-04 DIAGNOSIS — E878 Other disorders of electrolyte and fluid balance, not elsewhere classified: Secondary | ICD-10-CM | POA: Diagnosis present

## 2010-11-04 DIAGNOSIS — G459 Transient cerebral ischemic attack, unspecified: Secondary | ICD-10-CM

## 2010-11-04 DIAGNOSIS — D631 Anemia in chronic kidney disease: Secondary | ICD-10-CM | POA: Diagnosis present

## 2010-11-04 DIAGNOSIS — E785 Hyperlipidemia, unspecified: Secondary | ICD-10-CM | POA: Diagnosis present

## 2010-11-04 DIAGNOSIS — Z7982 Long term (current) use of aspirin: Secondary | ICD-10-CM

## 2010-11-04 DIAGNOSIS — G458 Other transient cerebral ischemic attacks and related syndromes: Principal | ICD-10-CM | POA: Diagnosis present

## 2010-11-04 DIAGNOSIS — Z79899 Other long term (current) drug therapy: Secondary | ICD-10-CM

## 2010-11-04 LAB — URINALYSIS, ROUTINE W REFLEX MICROSCOPIC
Bilirubin Urine: NEGATIVE
Glucose, UA: 100 mg/dL — AB
Ketones, ur: NEGATIVE mg/dL
Protein, ur: >300 mg/dL — AB

## 2010-11-04 LAB — AMMONIA: Ammonia: 67 umol/L — ABNORMAL HIGH (ref 11–35)

## 2010-11-04 LAB — COMPREHENSIVE METABOLIC PANEL
BUN: 72 mg/dL — ABNORMAL HIGH (ref 6–23)
CO2: 16 mEq/L — ABNORMAL LOW (ref 19–32)
Chloride: 119 mEq/L — ABNORMAL HIGH (ref 96–112)
Creatinine, Ser: 7.07 mg/dL — ABNORMAL HIGH (ref 0.4–1.2)
GFR calc non Af Amer: 6 mL/min — ABNORMAL LOW (ref >60)
Total Bilirubin: 0.9 mg/dL (ref 0.3–1.2)

## 2010-11-04 LAB — URINE MICROSCOPIC-ADD ON

## 2010-11-04 LAB — CBC
MCH: 30.6 pg (ref 26.0–34.0)
Platelets: 168 10*3/uL (ref 150–400)
RBC: 3.4 MIL/uL — ABNORMAL LOW (ref 3.87–5.11)

## 2010-11-04 LAB — RAPID URINE DRUG SCREEN, HOSP PERFORMED
Amphetamines: NOT DETECTED
Barbiturates: NOT DETECTED
Benzodiazepines: NOT DETECTED
Cocaine: NOT DETECTED
Opiates: NOT DETECTED

## 2010-11-04 LAB — BRAIN NATRIURETIC PEPTIDE: Pro B Natriuretic peptide (BNP): >3200 pg/mL — ABNORMAL HIGH (ref 0.0–100.0)

## 2010-11-04 LAB — POCT CARDIAC MARKERS
CKMB, poc: 5.5 ng/mL (ref 1.0–8.0)
Myoglobin, poc: >500 ng/mL (ref 12–200)

## 2010-11-04 LAB — PROTIME-INR
INR: 1.2 (ref 0.00–1.49)
Prothrombin Time: 15.4 seconds — ABNORMAL HIGH (ref 11.6–15.2)

## 2010-11-04 LAB — CARDIAC PANEL(CRET KIN+CKTOT+MB+TROPI): Troponin I: 0.48 ng/mL — ABNORMAL HIGH (ref 0.00–0.06)

## 2010-11-04 NOTE — H&P (Signed)
Family Medicine Teaching [facility] Admission History and Physical  Patient name: Claudia Rich Medical record number: [id number] Date of birth: Jun 05, 1927 Age: 77 y.o. Gender: female  Primary Care Provider: Dr. Rosalita Levan, [facility]  Chief Complaint: Confusion, non-verbal History of Present Illness: Pt is non-verbal.  Her Niece provides the history.  Pt's niece took her to church on Sunday and she was her normal self, talking walking, and had not been sick as far as her niece knows.  On Monday, pt did not answer phone calls.  This continued today so 911 was called, and the pt was found non-verbal and confused.    The patient was previously high-functioning, lived alone, still worked as a Librarian, academic, did all her ADL's and was driving.   Patient Active Problem List  Diagnoses  . DIABETES MELLITUS, TYPE II, UNCONTROLLED  . HYPERCHOLESTEROLEMIA, PURE  . ANEMIA, NORMOCYTIC  . ESSENTIAL HYPERTENSION, BENIGN  . G E R D  . P U D  . CHRONIC KIDNEY DISEASE STAGE IV (SEVERE)  . TACHYCARDIA   Past Medical History: DM II HLD Normocytic Anemia HTN GERD CKD Stage IV, preparing for HD  Past Surgical History: Recent AV fistula graft placed in preparation of HD.   Social History: Widowed, Non-smoker, non-drinker, no drug use.    Allergies: Allergies  Allergen Reactions  . Codeine Sulfate   . Meperidine Hcl     REACTION: vomiting    Current Outpatient Prescriptions  Medication Sig Dispense Refill  . amLODipine (NORVASC) 10 MG tablet Take 10 mg by mouth daily.        Marland Kitchen aspirin (BAYER CHILDRENS ASPIRIN) 81 MG chewable tablet Chew 81 mg by mouth daily.        Marland Kitchen atorvastatin (LIPITOR) 80 MG tablet Take 80 mg by mouth at bedtime.        . cloNIDine (CATAPRES) 0.3 MG tablet Take 0.3 mg by mouth 2 (two) times daily. For blood pressure       . furosemide (LASIX) 80 MG tablet Take 80 mg by mouth daily.        Marland Kitchen glipiZIDE (GLUCOTROL) 10 MG tablet Take 10 mg by mouth 2  (two) times daily. For diabetes       . glucose blood (ACCU-CHEK COMFORT CURVE) test strip 1 each by Other route as needed. Check blood sugar 3x daily. Disp 102 strips.       . hydrALAZINE (APRESOLINE) 25 MG tablet Take 25 mg by mouth 3 (three) times daily.        . metoprolol (LOPRESSOR) 25 MG tablet Take 25 mg by mouth 2 (two) times daily. For blood pressure       . ondansetron (ZOFRAN) 4 MG tablet Take 4 mg by mouth every 8 (eight) hours as needed. For nausea       . ranitidine (ZANTAC) 150 MG capsule Take 150 mg by mouth 2 (two) times daily. For stomach ulcer       . sitaGLIPtan (JANUVIA) 25 MG tablet Take 25 mg by mouth daily.         Review Of Systems: Level 5 caveat as pt. Is not speaking.   Physical Exam: T 97.8, BP 200/116, P 127, RR 30, POx > 97% RA General: Pt is drooling and non-verbal HEENT: PERRL, EOMI,  Heart: irregular rate, tachycardic Lungs: Diffuse Rhonchi, some increased work of breathing Abdomen: +BS, pt seems uncomfortable and some guarding with palpation, difficult to assess as she is not speaking or following  commands Extremities: Hyper pigmented spots on hands and feet Trace edema. Neurology: Pt responds and looks when her name is said, minimal following of commands.  Pt cannot lift either leg off bed.  Right facial droop.   Lab Results  Component Value Date   CKTOTAL 132 SLIGHT HEMOLYSIS 11/04/2010   TROPONINI  Value: 0.40        PERSISTENTLY INCREASED TROPONIN VALUES IN THE RANGE OF 0.06-0.49 ng/mL CAN BE SEEN IN:       -UNSTABLE ANGINA       -CONGESTIVE HEART FAILURE       -MYOCARDITIS       -CHEST TRAUMA       -ARRYHTHMIAS       -LATE PRESENTING MI       -COPD   CLINICAL FOLLOW-UP RECOMMENDED. SLIGHT HEMOLYSIS* 11/04/2010   Ammonia: 67   Color, Urine                             YELLOW            YELLOW  Appearance                               CLOUDY     a      CLEAR  Specific Gravity                         1.030             1.005-1.030  pH                                        5.5               5.0-8.0  Urine Glucose                            100        a      NEG              mg/dL  Bilirubin                                NEGATIVE          NEG  Ketones                                  NEGATIVE          NEG              mg/dL  Blood                                    SMALL      a      NEG  Protein                                  >300       a      NEG  mg/dL  Urobilinogen                             0.2               0.0-1.0          mg/dL  Nitrite                                  NEGATIVE          NEG  Leukocytes                               NEGATIVE          NEG  Squamous Epithelial / LPF                RARE              RARE  WBC / HPF                                0-2               <3               WBC/hpf  RBC / HPF                                0-2               <3               RBC/hpf  Bacteria / HPF                           RARE              RARE  Urine-Other                              SEE NOTE.    AMORPHOUS URATES  UDS: Negative  CXR:  Cardiomegaly and mild pulmonary edema with small pleural effusions.   Patchy right upper lobe opacity without corresponding opacity in   the lateral view is most compatible with atelectasis.  CT Head:  Comparison films now available demonstrating a new, vague area of   hypoattenuation in the left thalamus for which acute infarction   cannot be excluded.  Microvascular ischemic changes again noted.  EKG: Tachycardic with non-specific T-wave changes in inferolateral leads.   Assessment and Plan: 77 year old female who presents with confusion, newly non-verbal and not following commands, concerning for stroke: 1. Stroke Work up- Pt is Hypertensive, tachycardic, and minimally interactive.  Will admit to step-down unit. Will order MRI/MRA brain to further identify lesion found on left thalamus on CT scan.  Pt has had severe change in mental status.  Will make pt NPO as she is  drooling, cannot handle her secretions, and is likely an aspiration risk.  Will order PT/OT but patient's ability to work with them will likely be very limited.  2. Elevated troponin- concern for MI associated with stroke. ED has ordered an ECHO to evaluate for a ventricular clot, treat if  indicated.  Will continue to cycle cardiac enzymes and give PR Asprin.   3. HTN- pt with severely elevated BP in ED.  Pt will be NPO, so will give Metoprolol 5 mg IV q2 hr prn, with goal systolic BP betweein 160 and 180.  4. DM- SSI.  5. FEN/GI: Pt will be NPO with NS@100cc /hr.  Will give Protonix for GERD and Suppositories PRN for constipation, Zofran PRN for nausea.  6. DVT Prophylaxis: Heparin 5000 U SQ TID 7. Disposition: Pending stroke and cardiac work up.

## 2010-11-04 NOTE — H&P (Signed)
R2- H and P addendum  CC: AMS  HPI: pt is a 77 y.o. Female who was found down today by ems, confused and non verbal.  Pt was last seen normal by niece on Sunday.  Pt did not answer phone yesterday and when she still did not pick up phone today family was concerned and asked ems to check on pt at her home. EMS found pt with decreased responsiveness and non verbal.  Previous to today pt has been a high functioning elderly lady.  Still working as a Comptroller.  Also still driving and performing all ADL's.  The above information obtained from pt's niece who is at bedside since pt is nonverbal.  ROS: unable to obtain  Meds, PMH, PSH, Social Hx, Allergies- please see R1 note  PE: See R1 note for vital signs Gen- Resting in bed, no acute distress HEENT- PERRLA, drooling from side of mouth Resp- coarse lungs sounds bilateral, +wheezing Abd- diffuse tenderness, pt moans when abd palpated, +bs CV- irregular rate, regular rhythm, sinus tach on monitor Neuro- will look toward person talking to her, will follow simple commands, but not all.  Will squeeze with right hand, no squeeze with left. Will not move legs, but can wiggle toes.  Unable to test CN 2/2 pt unable to perform commands.  + blink reflex.  PERRLA.  Can make noises but no attempt to communicate verbally. Ext- no splinter hemmorrhages.  multiple small hyperpigmented spots on planter surface of feet.   See R1 note for labs and studies  A and P:  Pt is a 77 y.o. Female with AMS:  1) AMS- most likely 2/2 stroke.  Since hypoattenuation in left thalamus that could be infarction.  Will follow neuro exam. NPO since unable to protect airway.  Will keep SBP >170 to maintain adequate perfusion.  MRI/MRA of brain pending.  PT/OT eval tomorrow.   2)Elevated troponin- may be 2/2 to MI in the setting of a stroke.   heart event could have precipitated clot formation and caused a resultant CVA.  Unclear at this point.   Will follow cardiac enzymes.  Echo  pending. Aspirin given in ER.   3)HTN- Will allow bp to run high- goal of SBP to be equal to or greater than 170- will give metoprolol prn to maintain this pressure.   4)DM- SSI  5)FEN/GI- NPO  6)prophy- will consider heparin SQ- Cbc pending  7)dispo- pending complete workup and clinical improvement.

## 2010-11-05 ENCOUNTER — Inpatient Hospital Stay (HOSPITAL_COMMUNITY): Payer: Medicare HMO

## 2010-11-05 DIAGNOSIS — I059 Rheumatic mitral valve disease, unspecified: Secondary | ICD-10-CM

## 2010-11-05 LAB — CARDIAC PANEL(CRET KIN+CKTOT+MB+TROPI)
Relative Index: 3.1 — ABNORMAL HIGH (ref 0.0–2.5)
Total CK: 221 U/L — ABNORMAL HIGH (ref 7–177)
Troponin I: 0.44 ng/mL — ABNORMAL HIGH (ref 0.00–0.06)

## 2010-11-05 LAB — GLUCOSE, CAPILLARY: Glucose-Capillary: 143 mg/dL — ABNORMAL HIGH (ref 70–99)

## 2010-11-05 LAB — HEMOGLOBIN A1C
Hgb A1c MFr Bld: 6.4 % — ABNORMAL HIGH (ref <5.7)
Mean Plasma Glucose: 137 mg/dL — ABNORMAL HIGH (ref <117)

## 2010-11-06 ENCOUNTER — Inpatient Hospital Stay (HOSPITAL_COMMUNITY): Payer: Medicare HMO

## 2010-11-06 ENCOUNTER — Other Ambulatory Visit (HOSPITAL_COMMUNITY): Payer: 59

## 2010-11-06 LAB — GLUCOSE, CAPILLARY
Glucose-Capillary: 157 mg/dL — ABNORMAL HIGH (ref 70–99)
Glucose-Capillary: 260 mg/dL — ABNORMAL HIGH (ref 70–99)
Glucose-Capillary: 115 mg/dL — ABNORMAL HIGH (ref 70–99)
Glucose-Capillary: 133 mg/dL — ABNORMAL HIGH (ref 70–99)
Glucose-Capillary: 116 mg/dL — ABNORMAL HIGH (ref 70–99)

## 2010-11-06 LAB — MAGNESIUM: Magnesium: 2.2 mg/dL (ref 1.5–2.5)

## 2010-11-06 LAB — COMPREHENSIVE METABOLIC PANEL
Albumin: 3 g/dL — ABNORMAL LOW (ref 3.5–5.2)
BUN: 98 mg/dL — ABNORMAL HIGH (ref 6–23)
Chloride: 123 mEq/L — ABNORMAL HIGH (ref 96–112)
Creatinine, Ser: 8.28 mg/dL — ABNORMAL HIGH (ref 0.4–1.2)
GFR calc non Af Amer: 5 mL/min — ABNORMAL LOW (ref >60)
Glucose, Bld: 114 mg/dL — ABNORMAL HIGH (ref 70–99)
Total Bilirubin: 1 mg/dL (ref 0.3–1.2)

## 2010-11-06 LAB — CBC
HCT: 30.4 % — ABNORMAL LOW (ref 36.0–46.0)
MCH: 30 pg (ref 26.0–34.0)
MCV: 94.1 fL (ref 78.0–100.0)
Platelets: 138 10*3/uL — ABNORMAL LOW (ref 150–400)
RBC: 3.23 MIL/uL — ABNORMAL LOW (ref 3.87–5.11)

## 2010-11-06 LAB — PHOSPHORUS: Phosphorus: 7 mg/dL — ABNORMAL HIGH (ref 2.3–4.6)

## 2010-11-07 ENCOUNTER — Inpatient Hospital Stay (HOSPITAL_COMMUNITY): Payer: Medicare HMO

## 2010-11-07 LAB — FERRITIN: Ferritin: 343 ng/mL — ABNORMAL HIGH (ref 10–291)

## 2010-11-07 LAB — GLUCOSE, CAPILLARY
Glucose-Capillary: 121 mg/dL — ABNORMAL HIGH (ref 70–99)
Glucose-Capillary: 73 mg/dL (ref 70–99)
Glucose-Capillary: 113 mg/dL — ABNORMAL HIGH (ref 70–99)

## 2010-11-07 LAB — RENAL FUNCTION PANEL
Albumin: 3.3 g/dL — ABNORMAL LOW (ref 3.5–5.2)
BUN: 111 mg/dL — ABNORMAL HIGH (ref 6–23)
Calcium: 8.5 mg/dL (ref 8.4–10.5)
Chloride: 127 mEq/L — ABNORMAL HIGH (ref 96–112)
Creatinine, Ser: 8.6 mg/dL — ABNORMAL HIGH (ref 0.4–1.2)
GFR calc Af Amer: 5 mL/min — ABNORMAL LOW (ref >60)

## 2010-11-07 LAB — IRON AND TIBC
Saturation Ratios: 26 % (ref 20–55)
UIBC: 166 ug/dL

## 2010-11-07 LAB — HEPATITIS B SURFACE ANTIGEN: Hepatitis B Surface Ag: NEGATIVE

## 2010-11-07 LAB — HEPATITIS B SURFACE ANTIBODY,QUALITATIVE

## 2010-11-07 LAB — ALT: ALT: 313 U/L — ABNORMAL HIGH (ref 0–35)

## 2010-11-07 LAB — FOLATE: Folate: >20 ng/mL

## 2010-11-07 NOTE — H&P (Signed)
Claudia Rich, Claudia [name]:  [id number]  MEDICAL RECORD NO.:  [id number]           PATIENT TYPE:  I  LOCATION:  2602                         FACILITY:  [facility]  PHYSICIAN:  Nestor Ramp, MD        DATE OF BIRTH:  23-Dec-1926  DATE OF ADMISSION:  11/04/2010 DATE OF DISCHARGE:                             HISTORY & PHYSICAL   PRIMARY CARE PROVIDER:  Dr. Rosalita Levan, at [facility].  CHIEF COMPLAINT:  Mental status changes.  HISTORY OF PRESENT ILLNESS:  The patient is nonverbal.  Her niece provides the history.  The patient's niece says she took her to church on Sunday.  She was her normal self.  She was talking, walking, and had not been sick as far as her niece noticed.  On Monday, the patient did not answer phone calls from the family.  The patient's family continued to not be able to get a hold of her today on Tuesday, so 911 was called, and when they arrived, the patient was found nonverbal and confused, so she was brought to the emergency department.  The patient's niece denies any changes in her health or mood lately that she is aware of.  The patient is previously highly functioning.  She lives alone.  She still works as Librarian, academic.  She did all of her ADLs and was still driving her car.  PAST MEDICAL HISTORY:  Significant for: 1. Diabetes mellitus type 2. 2. Hyperlipidemia. 3. Normocytic anemia. 4. Hypertension. 5. GERD. 6. Peptic ulcer disease. 7. CKD stage IV preparing for dialysis.  PAST SURGICAL HISTORY:  The patient had a recent AV fistula graft placed in preparation for hemodialysis.  FAMILY HISTORY:  Noncontributory.  SOCIAL HISTORY:  The patient is widowed.  She is a nonsmoker, nondrinker.  No drug use.  ALLERGIES:  CODEINE and MEPERIDINE.  CURRENT HOME MEDICATIONS.: 1. Amlodipine 10 mg p.o. daily. 2. Aspirin 81 mg p.o. daily. 3. Lipitor 80 mg p.o. bedtime. 4. Clonidine 0.3 mg p.o. b.i.d. 5. Lasix 80 mg p.o. daily. 6.  Glucotrol 10 mg p.o. b.i.d. 7. Hydralazine 25 mg p.o. t.i.d. 8. Metoprolol 25 mg p.o. b.i.d. 9. Zofran 4 mg p.o. q.8 p.r.n. nausea. 10.Zantac 150 mg p.o. b.i.d. 11.Januvia 25 mg p.o. daily.  REVIEW OF SYSTEMS:  Level 5 Caveat implies as patient is nonverbal currently.  PHYSICAL EXAMINATION:  VITAL SIGNS:  Temperature 97.8, blood pressure 200/116, pulse is 127, respiratory rate 30, pulse ox greater than 97% on 2 liters nasal cannula. GENERAL:  The patient is drooling and nonverbal. HEAD, EYES, EARS, NOSE AND THROAT:  Pupils are equal, round, and reactive to light.  Extraocular movements are intact.  The patient will not follow commands. She will look at examiner and seems to focus. HEART:  The patient is regular rhythm and tachycardic.  No murmurs heard. LUNGS:  Diffuse rhonchi, some increased work of breathing.  No focal area of lung findings. ABDOMEN:  Positive bowel sounds.  The patient appears uncomfortable and there is some guarding with palpation of the abdomen, this is difficult to assess as  the patient is not speaking or following commands well. EXTREMITIES:  Hyperpigmented spots are visible on the hands and feet. The patient has trace lower extremity edema. NEUROLOGIC:  The patient responds and looks a person who says her name, but she minimally  follows commands, does not follow commands with her right side.  She is not moving her lower extremities.  She has a right facial droop and is drooling.  Unable to assess cranial nerves due to patient not following commands and not speaking.  LABORATORY DATA AND STUDIES:  CK 132, troponin-I 0.04, ammonia 67.  A urinalysis is significant for urine glucose 100, blood small, protein greater than 300.  Urine microscopic is unremarkable.  Urine drug screen is negative.  A chest x-ray, two-view showed cardiomegaly and mild pulmonary edema with small pleural effusions, patchy right upper lobe opacity without corresponding opacity in the  lateral views is most compatible with atelectasis.  A CT of the head shows comparison films now available demonstrating a new vague area of hypoattenuation in the left thalamus for which acute infarction cannot be excluded. Microvascular ischemic changes again noted.  An EKG that shows tachycardia with nonspecific T-wave changes in the inferolateral leads.  ASSESSMENT AND PLAN:  This is an 77 year old female who presents with confusion, is newly nonverbal and not following commands concerning for stroke. 1. Mental status changes: Stroke workup.  The patient is hypertensive tachycardic, and     minimally interactive.  We will admit her to step-down unit for     monitoring.  We will order an MRI and MRA of the brain to further     identify the lesion found in the left thalamus on the CT scan.  The     patient has had a significant change in mental status.  We will make her     n.p.o., for now she is drooling, cannot handle her secretions and     is likely an aspiration risk.  We will order PT, OT, but the     patient's ability to work with them will likely be very limited.further work up based on MRI findings and clinical situation. 2. Elevated troponin, this is concerning for an myocardial infarction or ischemia.        we will  cycle cardiac enzymes, monitor the patient on     telemetry, and we will give her PR aspirin. repeat EKG in am 3. Hypertension.  The patient has severely elevated blood pressures up     on arrival to the ED.  She is going to be n.p.o., so we will give     metoprolol 5 mg IV q.2 hours p.r.n. with a goal systolic blood     pressure between 160 and 180 mmHg given the possiblility she has had acute CVA--we will be permissive with BP control for first 24 hours. 4. Diabetes mellitus type 2, we will place the patient on sensitive     sliding scale insulin. 5. FEN/GI.  The patient will be n.p.o. with normal saline at 100 mL an     hour.  We will give Protonix for  prophylaxis.  We will give Zofran p.r.n. for nausea. 6. Code status.  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  7. Deep vein thrombosis prophylaxis, heparin 5000 units subcu t.i.d. There is no sign of head bleed on CT. 8. Disposition  is pending stroke and cardiac workup.    ______________________________ Ardyth Gal, MD   ______________________________ Nestor Ramp, MD    CR/MEDQ  D:  11/04/2010  T:  11/05/2010  Job:  [id number]  Electronically Signed by Ardyth Gal MD on 11/06/2010 04:54:14 PM Electronically Signed by Denny [name] MD on 11/07/2010 04:15:48 PM

## 2010-11-08 ENCOUNTER — Inpatient Hospital Stay (HOSPITAL_COMMUNITY): Payer: Medicare HMO

## 2010-11-08 LAB — URINALYSIS, ROUTINE W REFLEX MICROSCOPIC
Glucose, UA: NEGATIVE mg/dL
Leukocytes, UA: NEGATIVE
Protein, ur: 100 mg/dL — AB
pH: 6 (ref 5.0–8.0)

## 2010-11-08 LAB — CBC
MCHC: 32 g/dL (ref 30.0–36.0)
MCHC: 34 g/dL (ref 30.0–36.0)
Platelets: 137 10*3/uL — ABNORMAL LOW (ref 150–400)
Platelets: 194 10*3/uL (ref 150–400)
RBC: 3.49 MIL/uL — ABNORMAL LOW (ref 3.87–5.11)
RDW: 15.5 % (ref 11.5–15.5)
RDW: 14.6 % (ref 11.5–15.5)

## 2010-11-08 LAB — GLUCOSE, CAPILLARY
Glucose-Capillary: 111 mg/dL — ABNORMAL HIGH (ref 70–99)
Glucose-Capillary: 121 mg/dL — ABNORMAL HIGH (ref 70–99)
Glucose-Capillary: 126 mg/dL — ABNORMAL HIGH (ref 70–99)
Glucose-Capillary: 199 mg/dL — ABNORMAL HIGH (ref 70–99)
Glucose-Capillary: 93 mg/dL (ref 70–99)
Glucose-Capillary: 97 mg/dL (ref 70–99)

## 2010-11-08 LAB — BASIC METABOLIC PANEL
CO2: 29 mEq/L (ref 19–32)
Calcium: 9.7 mg/dL (ref 8.4–10.5)
Creatinine, Ser: 3 mg/dL — ABNORMAL HIGH (ref 0.4–1.2)
GFR calc Af Amer: 18 mL/min — ABNORMAL LOW (ref >60)
Glucose, Bld: 226 mg/dL — ABNORMAL HIGH (ref 70–99)

## 2010-11-08 LAB — RENAL FUNCTION PANEL
Calcium: 8.1 mg/dL — ABNORMAL LOW (ref 8.4–10.5)
GFR calc Af Amer: 7 mL/min — ABNORMAL LOW (ref >60)
GFR calc non Af Amer: 6 mL/min — ABNORMAL LOW (ref >60)
Phosphorus: 5.5 mg/dL — ABNORMAL HIGH (ref 2.3–4.6)
Sodium: 145 mEq/L (ref 135–145)

## 2010-11-08 LAB — URINE MICROSCOPIC-ADD ON

## 2010-11-09 ENCOUNTER — Inpatient Hospital Stay (HOSPITAL_COMMUNITY): Payer: Medicare HMO

## 2010-11-09 LAB — BASIC METABOLIC PANEL
BUN: 38 mg/dL — ABNORMAL HIGH (ref 6–23)
Creatinine, Ser: 4.53 mg/dL — ABNORMAL HIGH (ref 0.4–1.2)
GFR calc non Af Amer: 9 mL/min — ABNORMAL LOW (ref >60)

## 2010-11-09 LAB — GLUCOSE, CAPILLARY: Glucose-Capillary: 170 mg/dL — ABNORMAL HIGH (ref 70–99)

## 2010-11-09 LAB — CBC
MCH: 29.5 pg (ref 26.0–34.0)
MCHC: 32.2 g/dL (ref 30.0–36.0)
MCV: 91.7 fL (ref 78.0–100.0)
Platelets: 137 10*3/uL — ABNORMAL LOW (ref 150–400)
RDW: 15.2 % (ref 11.5–15.5)

## 2010-11-10 ENCOUNTER — Inpatient Hospital Stay (HOSPITAL_COMMUNITY): Payer: Medicare HMO

## 2010-11-10 LAB — BASIC METABOLIC PANEL
BUN: 53 mg/dL — ABNORMAL HIGH (ref 6–23)
CO2: 26 mEq/L (ref 19–32)
CO2: 26 mEq/L (ref 19–32)
Calcium: 7.7 mg/dL — ABNORMAL LOW (ref 8.4–10.5)
Chloride: 108 mEq/L (ref 96–112)
Chloride: 104 mEq/L (ref 96–112)
Creatinine, Ser: 5.25 mg/dL — ABNORMAL HIGH (ref 0.4–1.2)
GFR calc Af Amer: 10 mL/min — ABNORMAL LOW (ref >60)
GFR calc Af Amer: 9 mL/min — ABNORMAL LOW (ref >60)
Glucose, Bld: 184 mg/dL — ABNORMAL HIGH (ref 70–99)
Potassium: 4.6 mEq/L (ref 3.5–5.1)
Sodium: 140 mEq/L (ref 135–145)

## 2010-11-10 LAB — PHOSPHORUS
Phosphorus: 3.1 mg/dL (ref 2.3–4.6)
Phosphorus: 3.2 mg/dL (ref 2.3–4.6)

## 2010-11-10 LAB — GLUCOSE, CAPILLARY
Glucose-Capillary: 103 mg/dL — ABNORMAL HIGH (ref 70–99)
Glucose-Capillary: 171 mg/dL — ABNORMAL HIGH (ref 70–99)

## 2010-11-10 LAB — CBC
HCT: 27.5 % — ABNORMAL LOW (ref 36.0–46.0)
Hemoglobin: 9.3 g/dL — ABNORMAL LOW (ref 12.0–15.0)
Hemoglobin: 8.8 g/dL — ABNORMAL LOW (ref 12.0–15.0)
MCH: 30.7 pg (ref 26.0–34.0)
MCHC: 32 g/dL (ref 30.0–36.0)
MCV: 93.7 fL (ref 78.0–100.0)
RBC: 3.03 MIL/uL — ABNORMAL LOW (ref 3.87–5.11)
RBC: 2.9 MIL/uL — ABNORMAL LOW (ref 3.87–5.11)
WBC: 8.2 10*3/uL (ref 4.0–10.5)

## 2010-11-11 ENCOUNTER — Inpatient Hospital Stay (HOSPITAL_COMMUNITY): Payer: Medicare HMO

## 2010-11-11 DIAGNOSIS — R5381 Other malaise: Secondary | ICD-10-CM

## 2010-11-11 DIAGNOSIS — G9341 Metabolic encephalopathy: Secondary | ICD-10-CM

## 2010-11-11 LAB — PTH, INTACT AND CALCIUM
Calcium, Total (PTH): 8.1 mg/dL — ABNORMAL LOW (ref 8.4–10.5)
PTH: 506.3 pg/mL — ABNORMAL HIGH (ref 14.0–72.0)

## 2010-11-11 LAB — RENAL FUNCTION PANEL
BUN: 31 mg/dL — ABNORMAL HIGH (ref 6–23)
Calcium: 7.8 mg/dL — ABNORMAL LOW (ref 8.4–10.5)
Glucose, Bld: 131 mg/dL — ABNORMAL HIGH (ref 70–99)
Phosphorus: 3.4 mg/dL (ref 2.3–4.6)
Potassium: 4 mEq/L (ref 3.5–5.1)

## 2010-11-11 LAB — GLUCOSE, CAPILLARY
Glucose-Capillary: 135 mg/dL — ABNORMAL HIGH (ref 70–99)
Glucose-Capillary: 143 mg/dL — ABNORMAL HIGH (ref 70–99)
Glucose-Capillary: 199 mg/dL — ABNORMAL HIGH (ref 70–99)

## 2010-11-12 LAB — TROPONIN I: Troponin I: 0.04 ng/mL (ref 0.00–0.06)

## 2010-11-12 LAB — COMPREHENSIVE METABOLIC PANEL
ALT: 15 U/L (ref 0–35)
AST: 19 U/L (ref 0–37)
Alkaline Phosphatase: 101 U/L (ref 39–117)
CO2: 29 mEq/L (ref 19–32)
Chloride: 101 mEq/L (ref 96–112)
GFR calc non Af Amer: 16 mL/min — ABNORMAL LOW (ref >60)
Glucose, Bld: 226 mg/dL — ABNORMAL HIGH (ref 70–99)
Potassium: 3.7 mEq/L (ref 3.5–5.1)
Sodium: 140 mEq/L (ref 135–145)

## 2010-11-12 LAB — GLUCOSE, CAPILLARY
Glucose-Capillary: 243 mg/dL — ABNORMAL HIGH (ref 70–99)
Glucose-Capillary: 171 mg/dL — ABNORMAL HIGH (ref 70–99)
Glucose-Capillary: 341 mg/dL — ABNORMAL HIGH (ref 70–99)
Glucose-Capillary: 246 mg/dL — ABNORMAL HIGH (ref 70–99)
Glucose-Capillary: 209 mg/dL — ABNORMAL HIGH (ref 70–99)

## 2010-11-12 LAB — DIFFERENTIAL
Basophils Relative: 0 % (ref 0–1)
Eosinophils Absolute: 0.1 10*3/uL (ref 0.0–0.7)
Eosinophils Relative: 1 % (ref 0–5)
Neutrophils Relative %: 88 % — ABNORMAL HIGH (ref 43–77)

## 2010-11-12 LAB — CBC
HCT: 29.6 % — ABNORMAL LOW (ref 36.0–46.0)
Hemoglobin: 10.1 g/dL — ABNORMAL LOW (ref 12.0–15.0)
MCHC: 34.3 g/dL (ref 30.0–36.0)
MCV: 94.8 fL (ref 78.0–100.0)
RBC: 3.12 MIL/uL — ABNORMAL LOW (ref 3.87–5.11)

## 2010-11-12 LAB — URINE CULTURE: Colony Count: 35000

## 2010-11-12 LAB — RENAL FUNCTION PANEL
Albumin: 2.7 g/dL — ABNORMAL LOW (ref 3.5–5.2)
BUN: 25 mg/dL — ABNORMAL HIGH (ref 6–23)
Chloride: 103 mEq/L (ref 96–112)
Creatinine, Ser: 3.39 mg/dL — ABNORMAL HIGH (ref 0.4–1.2)
Glucose, Bld: 167 mg/dL — ABNORMAL HIGH (ref 70–99)

## 2010-11-12 LAB — URINALYSIS, ROUTINE W REFLEX MICROSCOPIC
Bilirubin Urine: NEGATIVE
Glucose, UA: 100 mg/dL — AB
Ketones, ur: NEGATIVE mg/dL
Nitrite: NEGATIVE
Protein, ur: >300 mg/dL — AB

## 2010-11-12 LAB — URINE MICROSCOPIC-ADD ON

## 2010-11-13 ENCOUNTER — Inpatient Hospital Stay (HOSPITAL_COMMUNITY): Payer: Medicare HMO

## 2010-11-13 LAB — CBC
MCH: 30.2 pg (ref 26.0–34.0)
MCV: 94.4 fL (ref 78.0–100.0)
Platelets: 134 10*3/uL — ABNORMAL LOW (ref 150–400)
RBC: 3.21 MIL/uL — ABNORMAL LOW (ref 3.87–5.11)

## 2010-11-13 LAB — RENAL FUNCTION PANEL
Albumin: 2.7 g/dL — ABNORMAL LOW (ref 3.5–5.2)
BUN: 34 mg/dL — ABNORMAL HIGH (ref 6–23)
Chloride: 104 mEq/L (ref 96–112)
Creatinine, Ser: 4.82 mg/dL — ABNORMAL HIGH (ref 0.4–1.2)

## 2010-11-13 LAB — GLUCOSE, CAPILLARY

## 2010-11-14 LAB — RENAL FUNCTION PANEL
Albumin: 2.8 g/dL — ABNORMAL LOW (ref 3.5–5.2)
Chloride: 99 mEq/L (ref 96–112)
GFR calc Af Amer: 16 mL/min — ABNORMAL LOW (ref >60)
GFR calc non Af Amer: 13 mL/min — ABNORMAL LOW (ref >60)
Potassium: 4.8 mEq/L (ref 3.5–5.1)
Sodium: 136 mEq/L (ref 135–145)

## 2010-11-14 LAB — GLUCOSE, CAPILLARY
Glucose-Capillary: 258 mg/dL — ABNORMAL HIGH (ref 70–99)
Glucose-Capillary: 359 mg/dL — ABNORMAL HIGH (ref 70–99)

## 2010-11-14 LAB — CBC
MCV: 94.6 fL (ref 78.0–100.0)
Platelets: 113 10*3/uL — ABNORMAL LOW (ref 150–400)
RBC: 3.31 MIL/uL — ABNORMAL LOW (ref 3.87–5.11)
WBC: 7.3 10*3/uL (ref 4.0–10.5)

## 2010-11-15 ENCOUNTER — Inpatient Hospital Stay (HOSPITAL_COMMUNITY): Payer: Medicare HMO

## 2010-11-15 LAB — CBC
HCT: 29.1 % — ABNORMAL LOW (ref 36.0–46.0)
MCH: 30.2 pg (ref 26.0–34.0)
MCH: 30 pg (ref 26.0–34.0)
MCHC: 31.9 g/dL (ref 30.0–36.0)
MCHC: 31.6 g/dL (ref 30.0–36.0)
Platelets: 107 10*3/uL — ABNORMAL LOW (ref 150–400)
RDW: 16.5 % — ABNORMAL HIGH (ref 11.5–15.5)

## 2010-11-15 LAB — RENAL FUNCTION PANEL
Albumin: 2.7 g/dL — ABNORMAL LOW (ref 3.5–5.2)
BUN: 32 mg/dL — ABNORMAL HIGH (ref 6–23)
CO2: 31 mEq/L (ref 19–32)
Calcium: 8.8 mg/dL (ref 8.4–10.5)
Calcium: 8.9 mg/dL (ref 8.4–10.5)
Creatinine, Ser: 5.46 mg/dL — ABNORMAL HIGH (ref 0.4–1.2)
Creatinine, Ser: 5.64 mg/dL — ABNORMAL HIGH (ref 0.4–1.2)
GFR calc Af Amer: 9 mL/min — ABNORMAL LOW (ref >60)
GFR calc non Af Amer: 8 mL/min — ABNORMAL LOW (ref >60)
Glucose, Bld: 245 mg/dL — ABNORMAL HIGH (ref 70–99)
Phosphorus: 5.2 mg/dL — ABNORMAL HIGH (ref 2.3–4.6)
Sodium: 137 mEq/L (ref 135–145)

## 2010-11-15 LAB — GLUCOSE, CAPILLARY: Glucose-Capillary: 205 mg/dL — ABNORMAL HIGH (ref 70–99)

## 2010-11-19 NOTE — Discharge Summary (Signed)
Claudia Rich, ABELSON NO.:  [id number]  MEDICAL RECORD NO.:  [id number]           PATIENT TYPE:  I  LOCATION:  2602                         FACILITY:  [facility]  PHYSICIAN:  Paula Compton, MD        DATE OF BIRTH:  03-24-1927  DATE OF ADMISSION:  11/04/2010 DATE OF DISCHARGE:  11/15/2010                              DISCHARGE SUMMARY   ATTENDING PHYSICIAN:  Paula Compton, MD  PRIMARY CARE PROVIDER:  Edd Arbour, MD at [facility].  DISCHARGE DIAGNOSES: 1. Transient ischemic attack/altered mental status. 2. Elevated troponin, likely secondary to ischemic strain. 3. Acute on chronic renal insufficiency. 4. Hypertension. 5. Diabetes mellitus type 2. 6. Hemodialysis.  DISCHARGE MEDICATIONS: 1. Amlodipine 5 mg 1 tablet by mouth daily. 2. Calcium carbonate 500 mg suspension by mouth every 6 hours as     needed. 3. Lasix 40 mg 1 tablet by mouth daily. 4. Hydroxyzine 25 mg by mouth every 8 hours as needed. 5. Labetalol 200 mg 1 tablet by mouth twice daily. 6. Linagliptin 5 mg 1 tablet by mouth daily. 7. Nutritional supplement soy Nepro with Carnivor liquid 237 mL by     mouth 3 times a day as needed. 8. Nephro-Vite tablets 1 tablet by mouth daily at bedtime. 9. Sarna Anti-Itch Lotion one application topically b.i.d. as needed. 10.Albuterol inhaler 2 puffs inhaled daily. 11.Aspirin 81 mg 1 tablet by mouth daily. 12.Clonidine 0.3 mg 1 tablet by mouth 3 times a day. 13.Glipizide 10 mg 2 tablets by mouth twice daily. 14.Iron 1 tablet by mouth daily. 15.Lipitor 80 mg 1 tablet by mouth daily. 16.Multivitamins. 17.Ondansetron 4 mg 1 tablet by mouth every hours as needed for nausea     and vomiting.  Stop taking the following medications; 1. Januvia 25 mg 1 tablet by mouth daily. 2. Amlodipine 10 mg 1 tablet by mouth daily. 3. Furosemide 80 mg 1 tablet by mouth daily. 4. Hydralazine 25 mg 1 tablet by mouth 3 times a day. 5. Zantac 150 mg 1 tablet by  mouth twice daily. 6. Aleve 220 mg 1 tablet by mouth daily.  CONSULTATIONS:   1. Nephrology 2. Interventional Radiology placed dialysis catheter.   PROCEDURES:  November 04, 2010, a chest x-ray two-view showed cardiomegaly and mild pulmonary edema with small pleural effusions, patchy right upper lobe opacity without corresponding opacity and lateral view, most compatible with atelectasis.  A CT head without contrast showed comparison films now demonstrating a new vague area of hypoattenuation in the left thalamus for which acute infarction cannot be excluded, microvascular ischemic changes again noted.  A portable chest x-ray showed increased lung density, most likely edema and MRI head without contrast showed; 1. No evidence of acute infarct, chronic left thalamic lacuna. 2. Intermittent motion artifact and examination had to be discontinued     prior to completion due to the patient's condition. 3. Maxillary sinus mucosal thickening with left maxillary viscous     fluid level.  On November 07, 2010, a chest two-view showed; 1. Decreased diffuse pulmonary edema since prior study. 2. Small bilateral pleural effusions  and bibasilar atelectasis.  A repeat chest two-view on November 09, 2010 showed; 1. No significant change since the prior exam. 2. Congestive heart failure with bibasilar airspace disease and small     bilateral pleural effusions.  On November 10, 2010, a swallowing function study was done.  The patient was started on dysphagia 1 diet.  LABORATORY DATA:  At discharge CBC showed a white count of 7.3, hemoglobin 9.7, hematocrit 31.3, platelets 113.  Renal function panel showed a sodium of 136, potassium of 4.8, chloride 99, CO2 of 29, BUN 21, creatinine 3.36, glucose 250, phosphorous 3.7, calcium 8.5, albumin 2.8.  Cardiac enzymes, troponin was initially 0.19, then 0.4 and then 0.48.  Ammonia was 67.  Urine drug screen was negative.  Be-type natriuretic peptide was over 3200,  hemoglobin A1c was 6.4.  BRIEF HOSPITAL COURSE:  Claudia Rich is an 77 year old African American female who presented to the ED nonverbal and with altered mental status. The patient's niece provided the history.  Briefly, the patient was her normal self and then was taken to [place].  She was talking, walking and had not been sick as far as the niece is concerned.  The next day,  the patient did not answer phone calls.  The patient continued to not be able to get hold of her on Tuesday, so the family called 911, when they arrived the patient was found nonverbal and confused.  The patient was previously highly functioning.  She lived alone.    She did all of her ADLs and was still driving a car. 1. Transient ischemic attack/altered mental status.  The patient's MRI     showed no acute infarct; however, due to the patient's residual     weakness and deconditioning it is presumed that the patient did     have a transient ischemic attack.  The patient continued to be     nonverbal on hospital day #2 and was admitted to the step-down     unit.  At that time, the patient's healthcare power of attorney was     called and healthcare POA changed the patient's code status from     DNR/DNI to full code.  On hospital day #3, the patient's mental status      began to improve.  The patient's mental status began to improve. She     began to speak again and was able to follow commands.     The patient was still not back to baseline mental status      according to family.     PT/OT were consulted and also nutrition and speech therapy.  PT and     OT recommended short-term SNF placement versus inpatient rehab.     Unfortunately, the patient's insurance would not cover inpatient     rehab, therefore, the patient was awaiting SNF placement for     several days.  Speech therapy recommended dysphasia diet.  The     patient tolerated honey thick liquid well. Patient was      discharged to short-term SNF with  outpatient hemodialysis and the     patient was eventually transferred to the hemodialysis floor where     she continued to improve clinically and mentally. 2. Elevated troponin, likely secondary to ischemic strain.  The     patient was monitored on telemetry.  She was given PR aspirin,     though she could take aspirin by mouth.  EKG was negative for any  acute ST elevation, therefore, Cardiology was not consulted.  No     further workup was necessary. 3. Acute on chronic renal insufficiency.  The patient's creatinine on     admission was 7.07.  Her creatinine continued to trend up to 8.6.     At that time, Renal was consulted and they decided to start     hemodialysis in the hospital.  Interventional Radiology was then     consulted to place a catheter.  The patient tolerated hemodialysis     well.  Renal is to set up outpatient therapy at discharge.  The     patient's creatinine at discharge was 3.36.  She will be dialyzed     once more on Saturday, November 15, 2010 before discharge. 4. Hypertension.  The patient's blood pressures were maintained at     systolic 160s-180s, diastolic 16X-09U.  At first, the patient was     n.p.o., therefore, she was started on metoprolol 12.5 mg IV every 6     hours p.r.n. with parameters.  The patient was eventually     transitioned to p.o. meds.  Her blood pressures were well     controlled on Norvasc 5 mg p.o. daily and labetalol 200 mg twice     daily.  The patient was also started back on her clonidine 0.3 mg     every 8 hours.  The following medications have been stopped     amlodipine 10 mg and hydralazine 25 mg.  Lasix has been reduced to     40 mg daily. 5. Type 2 diabetes.  The patient was started on sliding scale insulin.     Her CBGs were stable throughout hospital course.  She did not need     much insulin.  On day of discharge, she was transitioned to her     home meds of glipizide 10 mg p.o. b.i.d. and Tradjenta 5 mg p.o.     daily,  glipizide 25 mg was stopped.  The patient is to follow up     with her PCP for further diabetic management.  FOLLOWUP APPOINTMENTS:  Dr. Edd Arbour, at [facility], phone number [phone], appointment time December 16, 2010 at 1:30 p.m.  DISCHARGE CONDITION:  The patient was discharged to SNF in stable medical condition.    ______________________________ Barnabas Lister, MD   ______________________________ Paula Compton, MD    ID/MEDQ  D:  11/14/2010  T:  11/14/2010  Job:  [id number]  cc:   Lars Mage, MD Garnetta Buddy, M.D.  Electronically Signed by Barnabas Lister MD on 11/18/2010 03:13:40 PM Electronically Signed by Paula Compton MD on 11/19/2010 11:24:59 AM

## 2010-11-21 ENCOUNTER — Other Ambulatory Visit: Payer: Self-pay | Admitting: Internal Medicine

## 2010-11-21 DIAGNOSIS — G459 Transient cerebral ischemic attack, unspecified: Secondary | ICD-10-CM

## 2010-11-26 ENCOUNTER — Other Ambulatory Visit: Payer: 59

## 2010-11-26 LAB — DIFFERENTIAL
Basophils Relative: 0 % (ref 0–1)
Eosinophils Absolute: 0.3 10*3/uL (ref 0.0–0.7)
Lymphs Abs: 1.7 10*3/uL (ref 0.7–4.0)
Monocytes Relative: 6 % (ref 3–12)
Neutro Abs: 3.9 10*3/uL (ref 1.7–7.7)
Neutrophils Relative %: 62 % (ref 43–77)

## 2010-11-26 LAB — BASIC METABOLIC PANEL
BUN: 24 mg/dL — ABNORMAL HIGH (ref 6–23)
BUN: 30 mg/dL — ABNORMAL HIGH (ref 6–23)
Calcium: 9 mg/dL (ref 8.4–10.5)
Chloride: 108 mEq/L (ref 96–112)
Creatinine, Ser: 2.23 mg/dL — ABNORMAL HIGH (ref 0.4–1.2)
GFR calc Af Amer: 26 mL/min — ABNORMAL LOW (ref >60)
Glucose, Bld: 195 mg/dL — ABNORMAL HIGH (ref 70–99)
Potassium: 4 mEq/L (ref 3.5–5.1)
Sodium: 140 mEq/L (ref 135–145)

## 2010-11-26 LAB — POCT I-STAT, CHEM 8
BUN: 32 mg/dL — ABNORMAL HIGH (ref 6–23)
Chloride: 107 mEq/L (ref 96–112)
HCT: 36 % (ref 36.0–46.0)
Potassium: 4.4 mEq/L (ref 3.5–5.1)
Sodium: 142 mEq/L (ref 135–145)

## 2010-11-26 LAB — PROTIME-INR
INR: 0.93 (ref 0.00–1.49)
Prothrombin Time: 12.4 seconds (ref 11.6–15.2)

## 2010-11-26 LAB — GLUCOSE, CAPILLARY
Glucose-Capillary: 178 mg/dL — ABNORMAL HIGH (ref 70–99)
Glucose-Capillary: 186 mg/dL — ABNORMAL HIGH (ref 70–99)
Glucose-Capillary: 93 mg/dL (ref 70–99)

## 2010-11-26 LAB — SAMPLE TO BLOOD BANK

## 2010-11-26 LAB — CBC
HCT: 27.6 % — ABNORMAL LOW (ref 36.0–46.0)
Hemoglobin: 9.6 g/dL — ABNORMAL LOW (ref 12.0–15.0)
MCV: 93.8 fL (ref 78.0–100.0)
Platelets: 153 10*3/uL (ref 150–400)
Platelets: 180 10*3/uL (ref 150–400)
RBC: 3.55 MIL/uL — ABNORMAL LOW (ref 3.87–5.11)
RDW: 13.9 % (ref 11.5–15.5)
WBC: 5.6 10*3/uL (ref 4.0–10.5)
WBC: 6.3 10*3/uL (ref 4.0–10.5)

## 2010-11-26 LAB — URINALYSIS, ROUTINE W REFLEX MICROSCOPIC
Glucose, UA: 250 mg/dL — AB
Protein, ur: 100 mg/dL — AB
Specific Gravity, Urine: 1.01 (ref 1.005–1.030)
pH: 7.5 (ref 5.0–8.0)

## 2010-11-26 LAB — CARDIAC PANEL(CRET KIN+CKTOT+MB+TROPI)
CK, MB: 0.9 ng/mL (ref 0.3–4.0)
Total CK: 64 U/L (ref 7–177)
Troponin I: 0.1 ng/mL — ABNORMAL HIGH (ref 0.00–0.06)

## 2010-11-26 LAB — CK TOTAL AND CKMB (NOT AT ARMC)
Relative Index: INVALID (ref 0.0–2.5)
Total CK: 73 U/L (ref 7–177)

## 2010-11-26 LAB — HEMOGLOBIN A1C: Hgb A1c MFr Bld: 9 % — ABNORMAL HIGH (ref 4.6–6.1)

## 2010-11-26 LAB — URINE MICROSCOPIC-ADD ON

## 2010-11-26 LAB — TROPONIN I: Troponin I: 0.05 ng/mL (ref 0.00–0.06)

## 2010-11-28 LAB — CBC
HCT: 27 % — ABNORMAL LOW (ref 36.0–46.0)
HCT: 27 % — ABNORMAL LOW (ref 36.0–46.0)
Hemoglobin: 9 g/dL — ABNORMAL LOW (ref 12.0–15.0)
Hemoglobin: 9.3 g/dL — ABNORMAL LOW (ref 12.0–15.0)
MCHC: 33.5 g/dL (ref 30.0–36.0)
MCHC: 34.6 g/dL (ref 30.0–36.0)
MCV: 94.8 fL (ref 78.0–100.0)
Platelets: 159 10*3/uL (ref 150–400)
Platelets: 168 10*3/uL (ref 150–400)
Platelets: 171 10*3/uL (ref 150–400)
RBC: 2.97 MIL/uL — ABNORMAL LOW (ref 3.87–5.11)
RDW: 13.5 % (ref 11.5–15.5)
RDW: 13.8 % (ref 11.5–15.5)
WBC: 5.1 10*3/uL (ref 4.0–10.5)

## 2010-11-28 LAB — URINE MICROSCOPIC-ADD ON

## 2010-11-28 LAB — BASIC METABOLIC PANEL
BUN: 25 mg/dL — ABNORMAL HIGH (ref 6–23)
BUN: 28 mg/dL — ABNORMAL HIGH (ref 6–23)
CO2: 27 mEq/L (ref 19–32)
Calcium: 9.3 mg/dL (ref 8.4–10.5)
Calcium: 9.4 mg/dL (ref 8.4–10.5)
Chloride: 105 mEq/L (ref 96–112)
Creatinine, Ser: 2.02 mg/dL — ABNORMAL HIGH (ref 0.4–1.2)
GFR calc Af Amer: 29 mL/min — ABNORMAL LOW (ref >60)
GFR calc non Af Amer: 24 mL/min — ABNORMAL LOW (ref >60)
Glucose, Bld: 163 mg/dL — ABNORMAL HIGH (ref 70–99)
Potassium: 4.1 mEq/L (ref 3.5–5.1)
Sodium: 141 mEq/L (ref 135–145)

## 2010-11-28 LAB — URINALYSIS, ROUTINE W REFLEX MICROSCOPIC
Bilirubin Urine: NEGATIVE
Hgb urine dipstick: NEGATIVE
Specific Gravity, Urine: 1.019 (ref 1.005–1.030)
Urobilinogen, UA: 1 mg/dL (ref 0.0–1.0)
pH: 5 (ref 5.0–8.0)

## 2010-11-28 LAB — COMPREHENSIVE METABOLIC PANEL
Albumin: 2.8 g/dL — ABNORMAL LOW (ref 3.5–5.2)
BUN: 33 mg/dL — ABNORMAL HIGH (ref 6–23)
Calcium: 9.4 mg/dL (ref 8.4–10.5)
Creatinine, Ser: 2.53 mg/dL — ABNORMAL HIGH (ref 0.4–1.2)
Glucose, Bld: 179 mg/dL — ABNORMAL HIGH (ref 70–99)
Potassium: 3.4 mEq/L — ABNORMAL LOW (ref 3.5–5.1)
Total Protein: 7 g/dL (ref 6.0–8.3)

## 2010-11-28 LAB — DIFFERENTIAL
Basophils Relative: 0 % (ref 0–1)
Lymphocytes Relative: 17 % (ref 12–46)
Lymphs Abs: 1.2 10*3/uL (ref 0.7–4.0)
Monocytes Absolute: 0.6 10*3/uL (ref 0.1–1.0)
Monocytes Relative: 9 % (ref 3–12)
Neutro Abs: 5.4 10*3/uL (ref 1.7–7.7)
Neutrophils Relative %: 74 % (ref 43–77)

## 2010-11-28 LAB — GLUCOSE, CAPILLARY
Glucose-Capillary: 147 mg/dL — ABNORMAL HIGH (ref 70–99)
Glucose-Capillary: 164 mg/dL — ABNORMAL HIGH (ref 70–99)
Glucose-Capillary: 182 mg/dL — ABNORMAL HIGH (ref 70–99)
Glucose-Capillary: 222 mg/dL — ABNORMAL HIGH (ref 70–99)

## 2010-12-16 ENCOUNTER — Inpatient Hospital Stay: Payer: 59 | Admitting: Family Medicine

## 2010-12-17 ENCOUNTER — Other Ambulatory Visit (HOSPITAL_COMMUNITY): Payer: Self-pay | Admitting: Nephrology

## 2010-12-17 DIAGNOSIS — N186 End stage renal disease: Secondary | ICD-10-CM

## 2010-12-22 ENCOUNTER — Ambulatory Visit (HOSPITAL_COMMUNITY): Payer: 59 | Attending: Nephrology

## 2010-12-29 ENCOUNTER — Encounter: Payer: Self-pay | Admitting: Home Health Services

## 2010-12-29 ENCOUNTER — Ambulatory Visit (INDEPENDENT_AMBULATORY_CARE_PROVIDER_SITE_OTHER): Payer: Medicare HMO | Admitting: Family Medicine

## 2010-12-29 VITALS — BP 180/92 | HR 118 | Temp 98.2°F | Ht 64.25 in | Wt 120.0 lb

## 2010-12-29 DIAGNOSIS — N189 Chronic kidney disease, unspecified: Secondary | ICD-10-CM

## 2010-12-29 DIAGNOSIS — I10 Essential (primary) hypertension: Secondary | ICD-10-CM

## 2010-12-29 MED ORDER — CLONIDINE HCL 0.3 MG PO TABS: 0.3000 mg | ORAL_TABLET | Freq: Two times a day (BID) | ORAL | Status: DC

## 2010-12-29 MED ORDER — AMLODIPINE BESYLATE 10 MG PO TABS: 10.0000 mg | ORAL_TABLET | Freq: Every day | ORAL | Status: DC

## 2010-12-29 MED ORDER — METOPROLOL TARTRATE 25 MG PO TABS: 25.0000 mg | ORAL_TABLET | Freq: Two times a day (BID) | ORAL | Status: DC

## 2010-12-29 NOTE — Patient Instructions (Addendum)
Pick up  BP meds at the drug store, begin the metoprolol and clonidine first Have the nurse check her BP is still high in 4 days add the amlodipine  Return to see Dr. Rivka Safer in one week Continue the rest of the meds for now Will need a decision on the furosemide and the hydralazine then.  Meds to take are on the new print out Give this info to Dr. Lowell Guitar as well, he probably will change this some

## 2010-12-29 NOTE — Progress Notes (Signed)
  Subjective:    Patient ID: Claudia Rich, female    DOB: 19-Nov-1926, 77 y.o.   MRN: [id number]  HPI:  Complicated 77 year old who was scheduled as a same day apt to figure out her meds.  She was discharged from the hospital on 3/26, had a one month stay in a skilled nursing facility.  Apparently when the home health therapist came to the house today and took her BP and it was elevated they would not treat her.  Her medication lists are different from the hospital and what she was discharged from the nursing home.  I went through all the meds and reviewed the discharge summary.  She was only taking hydralazine and metoprolol.  She is not taking her furosemide, clonidine or amlodipine.   This lady did not complain of anything, she was with a lay caregiver who was serving as her advocate.  She was started on hemodialysis during the hospitalization, and has a temporary [place] catheter in place.    Review of Systems     Objective:   Physical Exam    Patient was alert, had right sided weakness, occasional myoclonic type jerks of her left side.  A formal exam was deferred as 30 minutes was spend on going through meds and sorting out a plan.    Assessment & Plan:  Hypertension: she will resume clonidine, metoprolol was refilled, if BP remains elevated in 4 days they are to start amlodipine.  They will follow up with Dr. Rivka Safer in one week to decide on adding additional medications as needed.   CKD stage V:  Hemodialysis three times per week, patient living at home with no family in the area.

## 2011-01-02 ENCOUNTER — Telehealth: Payer: Self-pay | Admitting: Family Medicine

## 2011-01-02 NOTE — Telephone Encounter (Signed)
Spoke with Dr. Raymondo Band who feels that patient may need to go on a low dose of insulin.  Attempted to get her in his pharmacy clinic next Tuesday but patient gets hemodialysis that day.  Schedule her with Dr. Cristal Ford for Monday at 11 am.  Her niece [name]) would like for Dr. Cristal Ford to call her after the visit to go over her recommendations.  Told her I would pass this information along to Dr. Cristal Ford and her team.

## 2011-01-02 NOTE — Telephone Encounter (Signed)
Ms. Claudia Rich is calling due to concern about pt's blood sugar levels.  When she check her glucose in the morning it's ranging between 150 to 165.  When she take mid day, it's 300+.  Niece is concerned about whether she need to increase her meds .  She is being monitored strictly on what she eats.  Would like a call back from nurse to discuss issue.  Niece is pt's Power of Atty.  Will fax copy of PA for confirmation.

## 2011-01-02 NOTE — Telephone Encounter (Signed)
Niece expressing concern that her aunt's CBGs are tending upwards towards the end of the day.  They are checking them in am, noon and at hs.  The am and noon readings range in the 150's but by HS she is going up to the mid 300s (355).  This has been happening over the past 2 weeks.  Upon reviewing her meds with the niece I noticed that they are giving her Glipizide 10 mg tabs, two in am and tow in pm.  This is different that what is in her record (10 mg po BID).  They are being very attentive to her diet and are wondering if her meds need to be adjusted.  Told her I would see if Dr. Rivka Safer or another physician would be willing to review and give recommendations.  I will call her back

## 2011-01-05 ENCOUNTER — Ambulatory Visit (INDEPENDENT_AMBULATORY_CARE_PROVIDER_SITE_OTHER): Payer: Medicare HMO | Admitting: Family Medicine

## 2011-01-05 ENCOUNTER — Encounter: Payer: Self-pay | Admitting: Family Medicine

## 2011-01-05 VITALS — BP 106/66 | HR 62 | Temp 98.1°F

## 2011-01-05 DIAGNOSIS — E119 Type 2 diabetes mellitus without complications: Secondary | ICD-10-CM

## 2011-01-05 DIAGNOSIS — I10 Essential (primary) hypertension: Secondary | ICD-10-CM

## 2011-01-05 DIAGNOSIS — J90 Pleural effusion, not elsewhere classified: Secondary | ICD-10-CM

## 2011-01-05 MED ORDER — SITAGLIPTIN PHOSPHATE 25 MG PO TABS: 25.0000 mg | ORAL_TABLET | Freq: Every day | ORAL | Status: DC

## 2011-01-05 NOTE — Assessment & Plan Note (Signed)
Likely cause of patient's cough and 2/2 fluid shifting, dialysis. Identified on plain film during recent hospitalization in 10/2010. Pulse ox 97% currently, and no fever or sign of infection. Consider CXR if symptoms worsen.

## 2011-01-05 NOTE — Progress Notes (Signed)
  Subjective:    Patient ID: Claudia Rich, female    DOB: [birth year], 77 y.o.   MRN: [id number]  HPI 1. DM. This week has uncontrolled blood sugars. Recently came home after short stay at [facility]. Daughter and grandaughter are caretakers currently, assisting with medications. Have been checking CBG in am and randomly throughout the day. Had value as high as 336, 240, 164  (randomly timed) late last week. Most recently, values have been 160 (fasting today), 164, 162 before and after breakfast. Have continued taking glipizide 20mg  BID and januvia 25 daily. Diet has not been stable as the family is struggling to find consistent meals/snacks that fit both diabetic and renal diets.   2. HTN. Severely uncontrolled last week. Likely 2/2 med rec problems after DC from SNF to home. Restarted clonidine and norvasc at last office visit. More controlled values currently. No medication side effects perceived.   3. Cough. Present for past week. Clear sputum. Denies fever, chills, malaise, CP, SOB.  4. ESRD. On HD T/R/S and scheduled for tomorrow.   Review of Systems See HPI.     Objective:   Physical Exam  Vitals reviewed. Constitutional: She appears well-developed and well-nourished. No distress.  HENT:  Head: Normocephalic and atraumatic.  Cardiovascular: Normal rate, regular rhythm and normal heart sounds.  Exam reveals no gallop.   No murmur heard. Pulmonary/Chest: Effort normal. No respiratory distress. She has no wheezes. She has rales.       Bibasilar rales  Musculoskeletal: She exhibits no edema and no tenderness.  Neurological: She is alert.          Assessment & Plan:

## 2011-01-05 NOTE — Assessment & Plan Note (Signed)
Currently below goal. May consider decreasing therapy at follow up if patient becomes symptomatic or orthostatic. F/u with PCP in one month.

## 2011-01-05 NOTE — Assessment & Plan Note (Signed)
Recent hyperglycemia, but to uncertain degree because CBGs have been randomly timed. Fasting glucose is 160 currently and last A1c was 6.4 in 3/12. This is an acceptable range to avoid hypoglycemia. Patient may eventually need insulin, but would like patient and family to determine a more stable dietary regimen as family seems to struggle with renal vs diabetic dietary choices. Ex. Glucerna daily.  Will continue current medication regimen Alma Friendly and glipizide BID) and advised checking 2 hour post-prandial and/or fasting CBGs on different days and check A1c at next follow up in one month.

## 2011-01-05 NOTE — Patient Instructions (Signed)
Nice to meet you. Continue taking same medications. Check blood sugar first thing in the morning, then 2 hours after each meal. You can choose which time to check this once per day. Bring in your measurements to Dr. Rivka Safer. Try to check blood pressure once every 2-3 days and also write down this measurement. You may drink glucerna supplements once daily. Avoiding high potassium foods for kidney disease.

## 2011-01-06 ENCOUNTER — Ambulatory Visit: Payer: Medicare HMO | Admitting: Family Medicine

## 2011-01-06 NOTE — H&P (Signed)
Claudia Rich, [name]:  [id number]   MEDICAL RECORD NO.:  [id number]          PATIENT TYPE:  OBV   LOCATION:  1832                         FACILITY:  [facility]   PHYSICIAN:  Pearlean Brownie, M.D.DATE OF BIRTH:  10-23-1926   DATE OF ADMISSION:  06/26/2008  DATE OF DISCHARGE:                              HISTORY & PHYSICAL   CHIEF COMPLAINT:  Status post fall.   HISTORY OF PRESENT ILLNESS:  Patient is a 77 year old African American  female who is in generally good health, who presents to the emergency  department via EMS status post a fall while at work today.  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  Patient  reports that at about 10:40 a.m. today, she noted that she felt dizzy  and swimmy-headed.  Patient then felt that she needed to go to the  bathroom.  She did so and had a normal void and a normal bowel movement.  Patient stood up from going to the bathroom and felt dizzy again.  At  that time, she felt like she might fall, and she reached for the door.  She states that she missed this and then fell to the floor.  Patient  reports that she heard the lady for which she cares call her son to help  her and denies that she lost consciousness.  Further report that I  received from the emergency department physician's assistant, patient  was thought to have lost consciousness and moreover was thought to have  hit her head.  Patient adamantly denies that she hit her head.  She also  denies that she lost consciousness.  When asked how she knows she did  not hit her head, patient states that she does not hurt in her head.   Workup in the emergency department thus far has been largely negative.  Family practice teaching service is called to admit the patient for  observation.  EKG performed in the emergency department is unchanged  from previous and shows normal sinus rhythm.  A head CT performed in the  emergency department shows a  small lacunar infarct that is likely old  but is negative for any acute abnormality.   ALLERGIES:  1. DEMEROL.  2. ASPIRIN 325 mg DOSAGE causes stomach upset.  Patient does take a      daily baby aspirin.   MEDICATIONS:  1. Aspirin 81 mg p.o. daily.  2. Diovan 320/12.5 mg by mouth once daily.  3. Enalapril 20 mg by mouth once daily.  4. Glyburide/Metformin 5/500 mg 2 tablets by mouth once daily.  5. Lipitor 80 mg p.o. daily.  6. Metoprolol XR 200 mg 1 tab by mouth daily.  7. Norvasc 10 mg p.o. daily.   PAST MEDICAL HISTORY:  1. Hypertension.  2. Hypercholesterolemia.  3. GERD.  4. Diabetes.  5. Chronic kidney disease with a baseline creatinine of 1.5 and      microalbuminuria of 2.  6. Hammertoe deformity on the right foot.  7. Glaucoma.   PAST SURGICAL HISTORY:  1. Patient has  a history of hyperplastic colon polyps on biopsy in      2006.  2. Status post hysterectomy and bilateral salpingo-oophorectomy      without cancer.  3. History of cataract extraction bilaterally.  4. Negative for colorectal cancer.   Sister does have some form of cancer that is undefined.  Sister with  chronic kidney disease who is on dialysis.   SOCIAL HISTORY:  Patient lives alone in [place].  She denies a  history of tobacco or alcohol use.  Patient has no children.  Patient  works part-time in Air traffic controller.  She still drives.   REVIEW OF SYSTEMS:  Patient denies any vision changes.  She does report  some recent nausea but no vomiting.  She reports normal bowel and  bladder habits and denies any bright red blood per rectum.  She states  that her speech is normal.  Her ambulation is normal.  She denies any  pain.  She states that she has been eating and drinking normally and  recently and did take breakfast this morning.   PHYSICAL EXAMINATION:  VITAL SIGNS:  In the emergency department,  orthostatics with a blood pressure of 152/69 lying with a pulse of 64  lying.  Sitting  blood pressure was 153/74 and pulse 65.  Standing blood  pressure 131/70 and pulse 69.  Respirations are 20.  Oxygen saturation  is 97% on room air.  Temperature is 97.2.  GENERAL:  Patient is alert and oriented.  She is appropriate with exam  and appears well-hydrated.  Patient appears younger than stated age.  HEENT:  Head is normocephalic and atraumatic without any obvious  abnormality.  Eyes:  Pupils are equal, round and reactive to light.  Extraocular muscles are intact.  Funduscopic exam is benign without any  hemorrhage, exudate or papilledema.  Vision appears grossly normal.  Patient does wear glasses.  Mouth:  Oropharynx is moist and clear  without erythema or exudate.  There is some halitosis noted on exam.  NECK:  There is no jugular venous distention.  CARDIOVASCULAR:  Regular rate and rhythm.  Normal S1 and S2.  There are  no murmurs auscultated.  Apical impulses nondisplaced with a normal  precordium.  LUNGS:  Work of breathing is unlabored.  No wheezes, rales, or rhonchi.  Patient is moving air bilaterally without difficulty.  ABDOMEN:  Positive bowel sounds.  Soft, nontender, nondistended.  No  masses are palpated.  MUSCULOSKELETAL:  Patient exhibits 5/5 strength in bilateral upper and  lower extremities.  Pulses are 2+ in dorsalis pedis and radial pulse  areas.  EXTREMITIES:  No lower extremity edema.  NEUROLOGIC:  Patient is alert and oriented to person, place, and time.  Her cranial nerves II-XII are grossly intact.  Her gait is normal.  Her  finger-to-nose is normal.  Patient's Romberg is negative.  Patient  exhibits normal gait and normal posture.  She has articulate speech.  Patient transfers from a supine to a standing position without any  difficulty and without assistance.  Patient ambulates in the room  without difficulty.  SKIN:  Intact without suspicious lesions or rashes.  PSYCH:  Patient is alert and cooperative.  She has normal attention span  and  participates with the exam appropriately.   LAB WORK:  White blood cell count 6.7, hemoglobin 11.3, platelet count  167 with 65% neutrophils.  I-STAT chemistry shows sodium 141, potassium  3.8, chloride 104, BUN 34, creatinine 1.6, glucose 168.  INR 1  with  normal PT/PTT.  Point-of-care cardiac enzymes are normal x1 set.  BNP is  less than 30.  Urinalysis is within normal limits.   Chest x-ray shows evidence of cardiomegaly without congestive heart  failure and a chronic left lower lobe density but no acute process.   CT scan shows atrophy and microvascular white matter changes.  Also  shows a likely small lacunar infarct in the right caudate nucleus but is  negative for any definite acute findings.   EKG shows normal sinus rhythm.  It is unchanged from previous.  There is  no ST elevation, ST depression, or T wave inversion noted.   ASSESSMENT/PLAN:  1. Dizziness with a possible syncopal episode:  This represents      possible syncopal episode, although the patient denies that she      lost consciousness; however, her subjective attestation to this may      be in doubt.  We will observe the patient for 23 hours, given her      age and ambulate her on the floor with nursing staff.  Will place      the patient on telemetry and perform another EKG in the morning.      Initial EKG in the emergency department is reassuring.  Patient      reports no pain.  Head CT is normal, and chest x-ray shows no acute      process.  Patient is on multiple antihypertensive medicines, but      her blood pressures in the emergency department are stable and a      little bit on the high side.  She has a history of a blood pressure      that has been hard to treat.  Her pulse is in the 60s, and her      oxygen saturation is 97-98% on room air.  We will resume her home      blood pressure medications, monitor on telemetry, and if the      patient does well, hopefully we will be able to discharge her       tomorrow.  We will repeat orthostatics on the floor as well.  2. Chronic renal failure:  Patient's creatinine is 1.9.  Baseline was      1.5 back in December, 2008.  It is unclear whether or not this is      acute-on-chronic or represents progression of her disease.  She      does have longstanding diabetes but seems to have been under fairly      decent control with a recent hemoglobin A1C of 7.3 in October,      2009.  Of note, the patient does appear to be on both an ACE      inhibitor and an angiotensin receptor blocker.  We will need to      verify her medications and make any indicated changes, as she does      not need to be on both these medications.  Also, the patient is on      Metformin and glyburide, and these are not indicated for her      current creatinine clearance.  We will stop these medicines and      start glipizide instead and follow her sugar on the morning BMET.      We will continue the patient's ACE inhibitor and hold her ARB at      this time.  3. Hypertension:  Patient's pressures are somewhat  elevated in the      150s systolic in the emergency department.  Given her presentation,      we would not want to push ahead with aggressive blood pressure      treatment.  We will continue her home regimen with the exception of      her ARB, and follow her orthostatics in the morning.  4. Gastroesophageal reflux disease:  Patient is not currently on a      PPI.  We will not initiate this in-house, as she is currently a low      risk for bleed, given her observation status and presentation.  5. Diabetes:  Last hemoglobin A1C was 7.3.  Sugars are somewhat      elevated in the emergency department.  We will discontinue her      Metformin and glyburide for now and start glipizide.  Follow up her      creatinine in the morning and likely defer further adjustment of      her diabetic regimen to Dr. Lanier Prude in the outpatient setting.  6. Fluids, electrolytes,  nutrition/gastrointestinal:  We will start      the patient on a carbohydrate-modified, heart-healthy diet.  We      will not put her on any prophylactic proton pump inhibitor at this      time.  7. Prophylaxis:  We will ambulate the patient.  She does not have an      indication for Lovenox at this time.   DISPOSITION:  A 23-hour observation.  We will be hopeful for discharge  on November 4th, tomorrow, if the patient does well, assuming there are  no events on telemetry.  Her EKG looks good, and her orthostatics remain  stable.  Patient does live alone, so verification of her ability to  attend to ADLs and to take care of herself will need to be made.      Myrtie Soman, MD  Electronically Signed      Pearlean Brownie, M.D.  Electronically Signed    TE/MEDQ  D:  06/26/2008  T:  06/26/2008  Job:  [id number]

## 2011-01-06 NOTE — Discharge Summary (Signed)
Claudia Rich, Claudia [name]:  [id number]   MEDICAL RECORD NO.:  [id number]          PATIENT TYPE:  OBV   LOCATION:  3705                         FACILITY:  [facility]   PHYSICIAN:  Pearlean Brownie, M.D.DATE OF BIRTH:  [birth year]   DATE OF ADMISSION:  06/26/2008  DATE OF DISCHARGE:  06/27/2008                               DISCHARGE SUMMARY   PRIMARY CARE Alita Waldren:  Lauro Franklin, MD at [facility].   DISCHARGE DIAGNOSES:  1. Syncope.  2. Chronic renal failure.  3. Hypertension.  4. Gastroesophageal reflux disease.  5. Diabetes.   DISCHARGE MEDICATIONS:  1. Norvasc 10 mg p.o. daily.  2. Metoprolol XR 200 mg p.o. daily.  3. Lipitor 80 mg p.o. daily.  4. Enalapril 20 mg p.o. daily.  5. Aspirin 81 mg p.o. daily.  6. Diovan 320/12.5 mg p.o. daily.  7. New medication, glipizide 10 mg p.o. b.i.d.   The patient was instructed to stop taking her Glucovance due to the  metformin in the medication and her poor creatinine function.   There were no consults during this hospitalization.   PROCEDURES ON THE DAY OF ADMISSION:  On June 26, 2008, an EKG was  done which showed normal sinus rhythm, basically unchanged from previous  EKGs.  The patient also had a 2-view chest x-ray on June 26, 2008,  that showed cardiomegaly without congestive heart failure and chronic  left lower lobe density and no acute process or interval change.  She  also had a head CT without contrast on June 26, 2008, that showed  atrophy and microvascular white matter changes, relatively mild,  probable small lacunar infarct on the right caudate nucleus, and no  definite acute findings.   Her labs include point-of-care cardiac markers that were negative.  She  had a CBC, white blood cell count of 6.7, hemoglobin of 11.3, and  hematocrit of 33.7 with platelet count of 167.  She had a prothrombin  time of 13.1 and INR of 1.  A PTT of 24.  She had a  beta-natriuretic  peptide of less than 30.  Her urinalysis was within normal limits except  for a urine glucose of 100 and protein of 30.  Her urine microscopic was  also within normal limits except for that it showed some hyaline casts  in the urine.  On the day prior or the day of discharge, she had a basic  metabolic panel that showed a sodium of 142, potassium 3.6, chloride  103, bicarb 32, BUN 25, creatinine of 1.71, and a glucose of 102.  Her  lipid profile showed her cholesterol at 118.  Her triglycerides at 59.  Her HDL at 58 and her LDL at 48.   BRIEF HOSPITAL COURSE:  This is a 77 year old female, who is in  generally good health, but she came in with a history of a fall that she  had sustained earlier in the day.  She had been going to the restroom  and when she had stood up from the toilet, she fell to the floor, and  the patient claims that she did not lose consciousness and does not  remember hitting her head.  The workup in the emergency room was essentially negative except for  that the head CT showed a small lacunar infarct that likely is old but  is negative for any acute abnormality.  1. For the syncope, the patient was put on a telemetry bed.  She had      orthostatic done on her which were negative.  She was continued on      her blood pressure medications as she is difficult to control.  Her      blood pressures ranged from 129-173 during that 24 hours that she      was here.  She was continued on hydrochlorothiazide, enalapril,      metoprolol, and amlodipine.  2. Chronic renal failure:  The patient's baseline is 1.5.  She was at      1.9 on admission.  The patient's metformin and glyburide      combination, Glucovance, was held because of her kidney function.      The enalapril was continued and glipizide replaced the metformin      and glyburide.  3. Hypertension:  The patient has a wide range of systolic blood      pressures.  She is difficult to control on  her blood pressure      medications that she was sent home, with the same medications that      she came in on as her blood pressure was fairly well maintained      while she was here.  4. Gastroesophageal reflux disease:  We have recommended that the      patient start a proton pump inhibitor; however, she is not on one      currently and was not started on one in the hospital.  This will be      something for her PCP to follow up as an outpatient.  5. Diabetes:  Her last hemoglobin A1c is 7.3.  She has another      hemoglobin A1c pending.  It is not back yet.  This will need to be      followed up on by her PCP.  Other things that the patient's PCP may      want to follow up on is a stress test as an outpatient.   FOLLOWUP APPOINTMENTS:  The patient has an appointment for Monday,  July 09, 2008, as 3 o'clock.  The patient was discharged home in  stable medical condition.      Jamie Brookes, MD  Electronically Signed      Pearlean Brownie, M.D.  Electronically Signed    AS/MEDQ  D:  06/27/2008  T:  06/28/2008  Job:  [id number]   cc:   Lauro Franklin, MD

## 2011-01-07 ENCOUNTER — Emergency Department (HOSPITAL_COMMUNITY)
Admission: EM | Admit: 2011-01-07 | Discharge: 2011-01-07 | Disposition: A | Discharge status: Home / Self Care | Payer: Medicare HMO | Attending: Emergency Medicine | Admitting: Emergency Medicine

## 2011-01-07 DIAGNOSIS — E860 Dehydration: Secondary | ICD-10-CM | POA: Insufficient documentation

## 2011-01-07 DIAGNOSIS — E119 Type 2 diabetes mellitus without complications: Secondary | ICD-10-CM | POA: Insufficient documentation

## 2011-01-07 DIAGNOSIS — R42 Dizziness and giddiness: Secondary | ICD-10-CM | POA: Insufficient documentation

## 2011-01-07 DIAGNOSIS — Z8673 Personal history of transient ischemic attack (TIA), and cerebral infarction without residual deficits: Secondary | ICD-10-CM | POA: Insufficient documentation

## 2011-01-07 DIAGNOSIS — F29 Unspecified psychosis not due to a substance or known physiological condition: Secondary | ICD-10-CM | POA: Insufficient documentation

## 2011-01-07 DIAGNOSIS — I129 Hypertensive chronic kidney disease with stage 1 through stage 4 chronic kidney disease, or unspecified chronic kidney disease: Secondary | ICD-10-CM | POA: Insufficient documentation

## 2011-01-07 DIAGNOSIS — N189 Chronic kidney disease, unspecified: Secondary | ICD-10-CM | POA: Insufficient documentation

## 2011-01-07 DIAGNOSIS — R112 Nausea with vomiting, unspecified: Secondary | ICD-10-CM | POA: Insufficient documentation

## 2011-01-07 DIAGNOSIS — R5381 Other malaise: Secondary | ICD-10-CM | POA: Insufficient documentation

## 2011-01-07 DIAGNOSIS — Z79899 Other long term (current) drug therapy: Secondary | ICD-10-CM | POA: Insufficient documentation

## 2011-01-07 DIAGNOSIS — R55 Syncope and collapse: Secondary | ICD-10-CM | POA: Insufficient documentation

## 2011-01-07 DIAGNOSIS — Z7982 Long term (current) use of aspirin: Secondary | ICD-10-CM | POA: Insufficient documentation

## 2011-01-07 LAB — DIFFERENTIAL
Basophils Relative: 0 % (ref 0–1)
Eosinophils Absolute: 0 10*3/uL (ref 0.0–0.7)
Lymphs Abs: 1.3 10*3/uL (ref 0.7–4.0)
Monocytes Absolute: 0.4 10*3/uL (ref 0.1–1.0)
Monocytes Relative: 7 % (ref 3–12)
Neutrophils Relative %: 71 % (ref 43–77)

## 2011-01-07 LAB — URINALYSIS, ROUTINE W REFLEX MICROSCOPIC
Protein, ur: >300 mg/dL — AB
Specific Gravity, Urine: 1.023 (ref 1.005–1.030)
Urobilinogen, UA: 0.2 mg/dL (ref 0.0–1.0)

## 2011-01-07 LAB — URINE MICROSCOPIC-ADD ON

## 2011-01-07 LAB — BASIC METABOLIC PANEL
Chloride: 93 mEq/L — ABNORMAL LOW (ref 96–112)
GFR calc non Af Amer: 9 mL/min — ABNORMAL LOW (ref >60)
Glucose, Bld: 243 mg/dL — ABNORMAL HIGH (ref 70–99)
Potassium: 3.8 mEq/L (ref 3.5–5.1)
Sodium: 132 mEq/L — ABNORMAL LOW (ref 135–145)

## 2011-01-07 LAB — CBC
MCH: 30.2 pg (ref 26.0–34.0)
MCHC: 33 g/dL (ref 30.0–36.0)
MCV: 91.7 fL (ref 78.0–100.0)
Platelets: 205 10*3/uL (ref 150–400)
RBC: 4.2 MIL/uL (ref 3.87–5.11)

## 2011-01-07 LAB — TROPONIN I: Troponin I: <0.3 ng/mL (ref <0.30)

## 2011-01-07 LAB — OCCULT BLOOD, POC DEVICE: Fecal Occult Bld: NEGATIVE

## 2011-01-07 LAB — MAGNESIUM: Magnesium: 2.1 mg/dL (ref 1.5–2.5)

## 2011-01-07 LAB — CK TOTAL AND CKMB (NOT AT ARMC): CK, MB: 2.1 ng/mL (ref 0.3–4.0)

## 2011-01-08 ENCOUNTER — Encounter (INDEPENDENT_AMBULATORY_CARE_PROVIDER_SITE_OTHER): Payer: Medicare HMO

## 2011-01-08 ENCOUNTER — Ambulatory Visit (INDEPENDENT_AMBULATORY_CARE_PROVIDER_SITE_OTHER): Payer: Medicare HMO | Admitting: Vascular Surgery

## 2011-01-08 DIAGNOSIS — T82598A Other mechanical complication of other cardiac and vascular devices and implants, initial encounter: Secondary | ICD-10-CM

## 2011-01-08 DIAGNOSIS — N186 End stage renal disease: Secondary | ICD-10-CM

## 2011-01-08 LAB — URINE CULTURE
Colony Count: NO GROWTH
Culture: NO GROWTH

## 2011-01-09 NOTE — Op Note (Signed)
[facility]  Patient:    Claudia Rich, Claudia Rich Visit Number: [id number] MRN: [id number]          Service Type: SUR Location: 3W 0342 01 Attending Physician:  Shelly Rubenstein Dictated by:   Sandria Bales. Ezzard Standing, M.D. Proc. Date: 08/26/00 Admit Date:  08/25/2001   CC:         Anselmo Rod, M.D.  Abigail Miyamoto, M.D.   Operative Report  DATE OF BIRTH:  Jul 19, 1927  PREOPERATIVE DIAGNOSIS:  History of tubulovillous adenoma.  POSTOPERATIVE DIAGNOSES:  Tubulovillous adenoma identified at proximal left colon below splenic flexure. I could not find one at the hepatic flexure.  PROCEDURE:  Intraoperative colonoscopy.  SURGEON:  Sandria Bales. Ezzard Standing, M.D.  ASSISTANT:  Abigail Miyamoto, M.D.  ANESTHESIA:  General.  INDICATIONS FOR PROCEDURE:  Ms. Kreuser is a 77 year old female who had undergone a colonoscopy by Dr. Charna Elizabeth. She was found to have two sessile polyps which by biopsy were villous adenomas and felt not to be amendable to colonoscopic resection. One polyp was seen at the hepatic flexure, the other at 30-35 cm from the anal verge and felt to be in the sigmoid colon.  DESCRIPTION OF PROCEDURE:  I assisted Dr. Magnus Ivan at laparotomy but we could not palpate a polyp in any part of the colon that we could feel. Therefore with the patient in lithotomy position, I passed a flexible Olympus colonoscope without difficulty around to her cecum. We did at least three or four passes through the hepatic flexure with Dr. Magnus Ivan palpating the bowel from the outside and me colonoscoping from the inside. Unable to identify any obvious villous adenoma or tumor of her right colon, hepatic flexure, or transverse colon. The second polyp was felt to be 30-35 cm from the anal verge. At colonoscopy, I did find a sessile polyp in the proximal left colon immediately below the splenic flexure which I think corresponded to this second polyp which was noted. Besides  that, she had two small polyps, one proximal and one slightly distal to this sessile polyp. This measured about 35-40 cm from the anal verge but again was in the proximal left colon.  The sigmoid colon was unremarkable as was the rectum.  This took about 30-45 minutes to do during the procedure. We identified at least one of the two polyps identified by Dr. Loreta Ave but we could not identify the other polyp felt to be at the hepatic flexure. We then proceeded with polypectomy. Dr. Magnus Ivan will dictate the open up part of this operation. Dictated by:   Sandria Bales. Ezzard Standing, M.D. Attending Physician:  Shelly Rubenstein DD:  08/26/01 TD:  08/26/01 Job: [id number]

## 2011-01-09 NOTE — Discharge Summary (Signed)
[facility]  Patient:    Claudia Rich, Claudia Rich Visit Number: [id number] MRN: [id number]          Service Type: SUR Location: 3W 0342 01 Attending Physician:  Shelly Rubenstein Dictated by:   Abigail Miyamoto, M.D. Admit Date:  08/25/2001 Discharge Date: 09/01/2001                             Discharge Summary  DISCHARGE DIAGNOSIS:  Tubulovillous adenoma of the colon, status post colotomy and polypectomy, as well as intraoperative colonoscopy.  HISTORY OF PRESENT ILLNESS:  Ms. Claudia Rich is a pleasant 77 year old female who was found on screening colonoscopy to have two large tubulovillous adenomas, one in the hepatic flexure and one approximately at 30 cm.  These were seen by Dr. Anselmo Rod.  Biopsies of both confirmed tubulovillous adenomas.  Therefore, the decision was made to proceed to the operating room for elective resection versus polypectomy.  After a long discussion with the patient and the family, she requested polypectomy rather then resection if these were indeed benign disease, and if they turned out to be cancer on frozen section, she would proceed with resection.  HOSPITAL COURSE:  The patient was admitted on 08/25/01, and taken to the operating room where she underwent exploratory laparotomy with mobilization of the splenic flexure, polypectomy x2 at the splenic flexure, and intraoperative colonoscopy.  Only the large polyp at the splenic flexure could be identified. No other polyp was seen other than a tiny one right at the splenic flexure as well.  In the operating room, there was no mass seen in the hepatic flexure. Postoperatively, the patient was taken in stable condition to a regular surgical floor.  By postoperative day #1, she was tolerating sips, and was getting out of bed into a chair.  She was advanced to a clear liquid diet.  By postoperative day #4, she is voiding on her own and tolerating a soft diet. Her incision was  healing well, and her ileus appeared to be continuing to resolve.  By postoperative day #6, she was continuing to ambulate well, her Foley was removed, and she began voiding well.  By postoperative day #7, she was having normal bowel movements, was tolerating a regular diet, her abdomen was soft, her incision was healing well, and the decision was made to discharge the patient to home.  DISCHARGE MEDICATIONS: 1. Resume her home medications. 2. Tylox for pain. 3. She will take milk of magnesia p.r.n. for constipation.  WOUND CARE:  She may shower.  ACTIVITY:  She is to do no heavy lifting.  FOLLOWUP:  In my office one week post discharge. Dictated by:   Abigail Miyamoto, M.D. Attending Physician:  Shelly Rubenstein DD:  09/22/01 TD:  09/23/01 Job: [id number]

## 2011-01-09 NOTE — Procedures (Signed)
[facility]  Patient:    Claudia Rich, Claudia Rich Visit Number: [id number] MRN: [id number]          Service Type: END Location: ENDO Attending Physician:  Charna Elizabeth Dictated by:   Anselmo Rod, M.D. Proc. Date: 07/13/01 Admit Date:  07/13/2001   CC:         Gwenlyn Perking, M.D.  Abigail Miyamoto, M.D.   Procedure Report  DATE OF BIRTH:  06-22-1927.  REFERRING PHYSICIAN:  Gwenlyn Perking  PROCEDURE PERFORMED:  Colonoscopy with biopsies.  ENDOSCOPIST:  Anselmo Rod, M.D.  INSTRUMENT USED:  Olympus video colonoscope.  INDICATIONS FOR PROCEDURE:  The patient is a 77 year old African-American female with guaiac positive stools undergoing screening colonoscopy.  PREPROCEDURE PREPARATION:  Informed consent was procured from the patient. The patient was fasted for eight hours prior to the procedure and prepped with a bottle of magnesium citrate and a gallon of NuLytely the night prior to the procedure.  PREPROCEDURE PHYSICAL:  The patient had stable vital signs.  Neck supple. Chest clear to auscultation.  S1, S2 regular.  Abdomen soft with normal abdominal bowel sounds.  DESCRIPTION OF PROCEDURE:  The patient was placed in the left lateral decubitus position and sedated with 60 mcg of fentanyl and 6 mg of Versed intravenously.  Once the patient was adequately sedated and maintained on low-flow oxygen and continuous cardiac monitoring, the Olympus video colonoscope was advanced from the rectum to the cecum without difficulty.  The patient had a fairly good prep.  A large mass was seen at 35 cm.  This was biopsied for pathology.  Two smaller polyps were seen, one more proximal and one slightly distal to the mass.  These were not removed but biopsies were done.  Another broad based polyp was seen at the hepatic flexure.  The patient had a few scattered diverticula in early stages of formation.  The cecum and the right colon appeared  normal.  IMPRESSION: 1. Multiple colonic polyps with large mass at 35 cm as mentioned above.    Biopsies done, results pending. 2. A few scattered diverticula.  RECOMMENDATIONS: 1. Complete blood count with diff.  BMet function panel. PT, PTT and    CEA levels will be checked today. 2. CT scan of the abdomen will be scheduled for the patient. 3. The patient has been introduced to Dr. Abigail Miyamoto at the bedside    in the endoscopy suite for further surgical evaluation and colonic    resection as planned by Dr. Magnus Ivan once the basic preoperative work-up    has been completed. Dictated by:   Anselmo Rod, M.D. Attending Physician:  Charna Elizabeth DD:  07/13/01 TD:  07/13/01 Job: [id number]

## 2011-01-09 NOTE — Op Note (Signed)
NAMEGOLDIE, Claudia Rich                  ACCOUNT NO.:  [id number]   MEDICAL RECORD NO.:  [id number]          PATIENT TYPE:  AMB   LOCATION:  ENDO                         FACILITY:  [facility]   PHYSICIAN:  Anselmo Rod, M.D.  DATE OF BIRTH:  May 23, 1927   DATE OF PROCEDURE:  06/01/2005  DATE OF DISCHARGE:                                 OPERATIVE REPORT   PROCEDURE:  Colonoscopy with cold biopsies x2.   ENDOSCOPIST:  Anselmo Rod, M.D.   INSTRUMENT USED:  Olympus videocolonoscope.   INDICATIONS FOR PROCEDURE:  The patient is a 77 year old African American  female with a history of diabetes and constipation, undergoing a repeat  colonoscopy for CRC purposes.  The patient has a history of adenomatous  polyps.   PREPROCEDURE PREPARATION:  Informed consent was secured from the patient.  The patient fasted for eight hours prior to the procedure and prepped with a  bottle of magnesium citrate and a gallon of GoLYTELY the night prior to the  procedure.  The risks and benefits of the procedure, including a 10% missed  rate of cancer and polyps, was discussed with the patient as well.   PREPROCEDURE PHYSICAL EXAMINATION:  VITAL SIGNS:  The patient had stable  vital signs.  NECK:  Supple.  CHEST:  Clear to auscultation.  S1 and S2 regular.  ABDOMEN:  Soft with normal bowel sounds.   DESCRIPTION OF PROCEDURE:  The patient was placed in the left lateral  decubitus position and sedated with 25 mcg of Fentanyl and 5 mg of Versed in  slow incremental doses.  Once the patient was adequately sedated and  maintained on low-flow oxygen and continuous cardiac monitoring, the Olympus  videocolonoscope was advanced from the rectum to the cecum.  Two small  sessile polyps were biopsied from the cecal base.  A few sigmoid diverticula  were noted.  Small internal hemorrhoids were appreciated on retroflexion in  the rectum.  There was some residual stool in the colon.  The appendiceal  orifice and ileocecal  valve were clearly visualized and photographed.  The  patient tolerated the procedure well without immediate complication.   IMPRESSION:  1.  Two small sessile polyps biopsied from the cecal base.  2.  A few early sigmoid diverticula.  3.  Small internal hemorrhoids.   RECOMMENDATIONS:  1.  Await pathology results.  2.  Avoid nonsteroidals, including aspirin, for the next one week.  3.  Repeat colonoscopy depending on pathology results.  4.  Outpatient followup as need arises in the future.      Anselmo Rod, M.D.  Electronically Signed     JNM/MEDQ  D:  06/01/2005  T:  06/01/2005  Job:  [id number]   cc:   Janetta Hora. Fields, MD  [address], [place] [address]   Anastasio Auerbach, MD  Fax: [phone]

## 2011-01-09 NOTE — Op Note (Signed)
NAME:  Claudia Rich, Claudia Rich                         ACCOUNT NO.:  [id number]   MEDICAL RECORD NO.:  [id number]                   PATIENT TYPE:  AMB   LOCATION:  ENDO                                 FACILITY:  [facility]   PHYSICIAN:  Charna Elizabeth, M.D.                   DATE OF BIRTH:  [birth year]   DATE OF PROCEDURE:  04/26/2002  DATE OF DISCHARGE:                                 OPERATIVE REPORT   PROCEDURE:  Colonoscopy with snare polypectomy x1.   ENDOSCOPIST:  Charna Elizabeth, M.D.   INSTRUMENT USED:  Olympus video colonoscope.   INDICATIONS:  A 77 year old African-American female, status post colotomy in  11/02 for tubulovillous adenoma removed from the left colon and another  polyp recognized at close to the hepatic flexure, undergoing a repeat  colorectal cancer screening to rule out polyps.   PREPROCEDURE PREPARATION:  Informed consent was procured from the patient.  The patient was fasted for eight hours prior to the procedure and prepped  with a bottle of magnesium citrate and a gallon of NuLytely the night prior  to the procedure.   PREPROCEDURE PHYSICAL:  VITAL SIGNS:  The patient had stable vital signs.  NECK:  Supple.  CHEST:  Clear to auscultation.  S1, S2 regular.  ABDOMEN:  Soft with a well-healed surgical scar in the midline below the  umbilicus.   DESCRIPTION OF PROCEDURE:  The patient was placed in the left lateral  decubitus position and sedated with 50 mcg of fentanyl and 5 mg of Versed  intravenously.  Once the patient was adequately sedate and maintained on low-  flow oxygen and continuous cardiac monitoring, the Olympus video colonoscope  was advanced from the rectum to the cecum without difficulty.  The patient  had a fairly good prep.  The appendiceal orifice and the ileocecal valve  were clearly visualized and photographed.  There was a broad-based polyp  snared at 90 cm.  This is the polyp that was seen on the previous  colonoscopy done in 11/02.  This,  however, was not recognized during the  colonoscopy done intraoperatively thereafter when the left colon  tubulovillous adenoma was removed.  A snare polypectomy was done, and the  polyp at 90 cm was snared.  The site of the removal of the previous  tubulovillous adenoma was recognized with heaping of mucosa in this area.  No abnormality was noted besides this.  There was an isolated diverticulum  in the transverse colon.  There were small internal hemorrhoids seen on  retroflexion.  The patient tolerated the procedure well without  complications.   IMPRESSION:  1. A broad-based polyp, snared from 90 cm.  2. Site of removal of previous tubulovillous adenoma in left colon     recognized.  3. Isolated diverticulum in transverse colon.  4. Small internal hemorrhoids.    RECOMMENDATIONS:  1. Await pathology results.  2. Avoid all  nonsteroidals including aspirin.  3. Outpatient follow-up in the next two weeks for further recommendations.                                                  Charna Elizabeth, M.D.    JM/MEDQ  D:  04/26/2002  T:  04/26/2002  Job:  [id number]   cc:   Riley Lam A. Magnus Ivan, M.D.  Fax: [phone]   Gwenlyn Perking, M.D.

## 2011-01-09 NOTE — Op Note (Signed)
[facility]  Patient:    [name], [name] Visit Number: [id number] MRN: [id number]          Service Type: SUR Location: 3W 0342 01 Attending Physician:  Shelly Rubenstein Dictated by:   Abigail Miyamoto, M.D. Proc. Date: 08/25/01 Admit Date:  08/25/2001                             Operative Report  PREOPERATIVE DIAGNOSIS:  Tubulovillous adenoma of the colon.  POSTOPERATIVE DIAGNOSIS:  Tubulovillous adenoma of the colon.  PROCEDURE: 1. Exploratory laparotomy. 2. Mobilization of splenic flexure. 3. Colonic polypectomy. 4. Colonoscopy.  SURGEON:  Abigail Miyamoto, M.D.  ASSISTANT:  Sandria Bales. Ezzard Standing, M.D.  ANESTHESIA:  General endotracheal anesthesia.  INDICATIONS:  Claudia Rich is a 77 year old female who underwent a recent colonoscopy. She was found to have two separate polyps, one at approximately 35 cm and one _______ in the hepatic flexure. Both were biopsied and revealed tubulovillous adenomas. Therefore, a decision was made to proceed with exploratory laparotomy and possible polypectomy versus segmental resection pending the findings.  FINDINGS:  The patient was found to have a mass at the splenic flexure and several polyps just adjacent to this. The larger one revealed a tubulovillous adenoma on frozen section. Because of the inability to palpate any of the tumors, an intraoperative colonoscopy was performed. The bowel was totally visualized and no other polyps were identified.  PROCEDURE IN DETAIL:  The patient was brought to the operating room and identified as Claudia Rich. She was placed on the operating room table and then general anesthesia was induced. The patient was then placed in the lithotomy position. Her abdomen was then prepped and draped in the usual sterile fashion. Using a #10 blade, a midline incision was then created. The incision was carried down through the fascia with electrocautery. The fascia was then opened along  with the peritoneum. Several adhesions of omentum to the abdominal wall were taken down with electrocautery. The abdomen was then thoroughly explored. The colon was palpated along the sigmoid colon which was mobilized along the white line of Tolt, the transverse colon and the hepatic flexure and cecum. The hepatic flexure had to be taken down as well. The polyps, however, could not be palpated. At this point, an intraoperative colonoscopy was performed by Dr. Ezzard Standing. The colonoscope was inserted through the anus, manipulated through the entire length of the colon. One of the polyps was easily identified at the splenic flexure and a silk suture was used to mark this. A separate polyp was just distal to this. The hepatic flexure was thoroughly examined along with the cecum and no polyp was identified in this area. The rest of the colon again was viewed quite intensely and again, no other polyps were identified. At this point, the decision was made to proceed with the polypectomy at the splenic flexure. A longitudinal colotomy was then made on the antimesenteric border. The polyp was elevated to the wound and grasped with a Babcock. The polyp was then excised with electrocautery. The mucosal defect was closed with a running 3-0 Vicryl suture. The second poly was identified and likewise excised. The large polyp was sent for frozen section and sectioning revealed tubulovillous adenoma. The polypectomy site was then closed in a transverse fashion by interrupted 3-0 silk sutures. Adequate closure appeared to be achieved. Prior to this, the splenic flexure had to be taken down with the electrocautery in order  to mobilize the colon up. The colon was then copiously irrigated with normal saline. Hemostasis appeared to be achieved. The midline fascia was then closed with a running #1 Prolene suture. The skin was then irrigated and closed with skin staples. The patient tolerated the procedure well. All  sponge, needle, and instrument counts were correct at the end of the procedure. The patient was then extubated in the operating room and taken in stable condition to the recovery room. Dictated by:   Abigail Miyamoto, M.D. Attending Physician:  Shelly Rubenstein DD:  08/25/01 TD:  08/26/01 Job: [id number]

## 2011-01-09 NOTE — Assessment & Plan Note (Signed)
OFFICE VISIT  Claudia Rich, Claudia Rich DOB:  1926-10-17                                       01/08/2011 ZOXWR#:[id number]  The patient presents today for followup after placement of a left brachiocephalic AV fistula on 09/24/2010.  The patient currently is dialyzing via right-sided catheter on Tuesday, Thursday and Saturday. She has had no difficulties with her catheter.  CHRONIC MEDICAL PROBLEMS:  Continue to remain diabetes, hypertension, elevated cholesterol.  The patient denies any shortness of breath or chest pain on review of systems.  PHYSICAL EXAM:  Today blood pressure is 117/66 in the right arm, heart rate 68 and regular.  Temperature is 97.4.  Left upper extremity has an easily palpable 2+ radial pulse.  The fistula has an easily palpable thrill but the vein is diffusely small on the upper arm.  The patient had a duplex ultrasound today of the fistula which shows a stenosis several centimeters from the arterial anastomosis in the proximal brachium.  Velocities in this area were 843 cm/sec.  Fistula diameter was fairly uniform between 50 and 60 mm and for the most part less than 5 mm from the skin surface.  I believe the best option for the patient at this point would first be a fistulogram with the catheter pointing antegrade toward the arterial end and hopefully we could get a wire across this stenosis proximally and angioplasty this.  If not a fistulogram should guide [place] in management for possible patch angioplasty in the operating room if necessary.  All this was discussed with the patient today including risks, benefits, possible complications of the procedure.  She understands and agrees to proceed.  Her fistulogram is scheduled for 01/23/2011.    Janetta Hora. Dandra Shambaugh, MD Electronically Signed  CEF/MEDQ  D:  01/08/2011  T:  01/09/2011  Job:  [id number]  cc:   Terrial Rhodes, M.D.

## 2011-01-12 ENCOUNTER — Encounter: Payer: Self-pay | Admitting: Family Medicine

## 2011-01-12 ENCOUNTER — Ambulatory Visit (INDEPENDENT_AMBULATORY_CARE_PROVIDER_SITE_OTHER): Payer: Medicare HMO | Admitting: Family Medicine

## 2011-01-12 DIAGNOSIS — J90 Pleural effusion, not elsewhere classified: Secondary | ICD-10-CM

## 2011-01-12 DIAGNOSIS — I10 Essential (primary) hypertension: Secondary | ICD-10-CM

## 2011-01-12 DIAGNOSIS — N184 Chronic kidney disease, stage 4 (severe): Secondary | ICD-10-CM

## 2011-01-12 NOTE — Progress Notes (Signed)
  Subjective:    Patient ID: Claudia Rich, female    DOB: May 19, 1927, 77 y.o.   MRN: [id number]  HPI 1. Hypertension/Hypotension Patient was started on clonidine/lopressor/norvasc for elevated BP at the beginning of the month. She has subsequently had lower blood pressure to the 100/50 range that the HD team feels may be too low. She is also complaining of fatigue. I stopped her Clonidine, and continued her Norvasc/Lopressor - this will help with the fatigue/sedation and hypotension.  2. Left AV fistula not functioning Per her niece the HD team has not been able to use her left AV fistula despite being past 6 weeks. This is secondary to scar tissue and she is scheduled for a revision/angioplasty on the first. She has a right permacath in the meantime.  3. Memory loss Since her stroke she has had short term memory loss. She also has some expressive aphasia. She said this is slowly improving.  4. Fluid restriction - HD Per Dr. Lowell Guitar - 32 oz restriction. Advised patient to follow up with Nephrology concerning this issue.  Review of Systems  All other systems reviewed and are negative.       Objective:   Physical Exam  Constitutional: She is oriented to person, place, and time. She appears well-developed. No distress.  HENT:  Head: Normocephalic and atraumatic.  Eyes: Pupils are equal, round, and reactive to light.  Neck: Normal range of motion. Neck supple.  Cardiovascular: Normal rate and regular rhythm.   No murmur heard. Pulmonary/Chest: Effort normal. No respiratory distress. She has no wheezes. She has rales. She exhibits no tenderness.  Abdominal: Soft. Bowel sounds are normal. She exhibits no distension. There is no tenderness.  Musculoskeletal: Normal range of motion.  Neurological: She is alert and oriented to person, place, and time.  Skin: She is not diaphoretic.  Psychiatric: She has a normal mood and affect.          Assessment & Plan:  1.  Hypertension/Hypotension Patient was started on clonidine/lopressor/norvasc for elevated BP at the beginning of the month. She has subsequently had lower blood pressure to the 100/50 range that the HD team feels may be too low. She is also complaining of fatigue. I stopped her Clonidine, and continued her Norvasc/Lopressor - this will help with the fatigue/sedation and hypotension.  2. Left AV fistula not functioning Per her niece the HD team has not been able to use her left AV fistula despite being past 6 weeks. This is secondary to scar tissue and she is scheduled for a revision/angioplasty on the first. She has a right permacath in the meantime.  3. Memory loss Since her stroke she has had short term memory loss. She also has some expressive aphasia. She said this is slowly improving.  4. Fluid restriction - HD Per Dr. Lowell Guitar - 32 oz restriction. Advised patient to follow up with Nephrology concerning this issue.

## 2011-01-15 NOTE — Discharge Summary (Signed)
  NAMESHERYL, SAINTIL NO.:  [id number]  MEDICAL RECORD NO.:  [id number]           PATIENT TYPE:  I  LOCATION:  2602                         FACILITY:  [facility]  PHYSICIAN:  Paula Compton, MD        DATE OF BIRTH:  [birth year]  DATE OF ADMISSION:  11/04/2010 DATE OF DISCHARGE:                              DISCHARGE SUMMARY   ADDENDUM The patient continued to remain in stable clinical condition overnight. No changes were made to her medication regimen.  She is currently receiving hemodialysis and is stable for discharge to skilled nursing facility immediately afterwards.     Delbert Harness, MD   ______________________________ Paula Compton, MD    KB/MEDQ  D:  11/15/2010  T:  11/15/2010  Job:  [id number]  Electronically Signed by Delbert Harness MD on 01/14/2011 12:22:38 PM Electronically Signed by Paula Compton MD on 01/15/2011 03:27:44 PM

## 2011-01-17 NOTE — Procedures (Unsigned)
VASCULAR LAB EXAM  INDICATION:  Nonmaturing brachiocephalic fistula.  HISTORY:  EXAM:  Left arteriovenous fistula duplex.  IMPRESSION: 1. Hemodynamically significant stenosis present at the level of the     distal brachium and proximal mid brachium. 2. No evidence of significant branches identified with normal depth of     vein visualized. 3. Diameter changes present in the distal segment, most likely     contributing to the velocity changes.  ___________________________________________ Claudia Hora Fields, MD  SH/MEDQ  D:  01/08/2011  T:  01/08/2011  Job:  [id number]

## 2011-01-20 NOTE — Consult Note (Signed)
NAMEORALEE, RAPAPORT NO.:  [id number]  MEDICAL RECORD NO.:  [id number]           PATIENT TYPE:  I  LOCATION:  2602                         FACILITY:  [facility]  PHYSICIAN:  Garnetta Buddy, M.D.   DATE OF BIRTH:  [birth year]  DATE OF CONSULTATION: DATE OF DISCHARGE:                                CONSULTATION   PRIMARY NEPHROLOGIST:  Mindi Slicker. Lowell Guitar, MD  REFERRING PHYSICIAN:  [facility].  CHIEF COMPLAINT:  Acute on chronic kidney insufficiency.  HISTORY OF PRESENT ILLNESS:  The patient is an 77 year old woman with past medical history of chronic kidney disease, stage IV secondary to nephrosclerosis.  The patient has diabetes type 2, which is well controlled with HbA1c of 6.4, has hypertension and other medical problems as mentioned below.  The patient was found unresponsive by EMS two days prior to admission and was brought to ER by the EMS.  The patient was at least down for 24 hours before that.  The patient's creatinine in the emergency room was found to be 7.07.  The last known creatinine for the patient is 3.71, which was done on November 2011. The patient's creatinine has increased to 8.28 today and that is the reason we were consulted by the [facility].  The patient is currently sleeping and unable to answer any questions.  The patient was apparently awake last night and was alert.  She denied any recent change in meds or any recent dietary changes.  There are no other complaints at this point of time.  PAST MEDICAL HISTORY: 1. Diabetes type 2. 2. Hyperlipidemia. 3. Normocytic anemia. 4. Hypertension. 5. GERD. 6. Peripheral vascular disease. 7. Chronic kidney disease type IV secondary to nephrosclerosis without     uremic symptoms in the past.  PAST SURGICAL HISTORY:  The patient had an AV fistula placed on September 25, 2010 by Dr. Darrick Penna.  SOCIAL HISTORY:  The patient lives alone in [place].  The patient  is widowed.  The patient was working until last year and is currently retired.  The patient is a nonsmoker and nondrinker.  Denies any drug use.  FAMILY HISTORY:  Noncontributory.  ALLERGIES:  CODEINE, DEMEROL, and ASPIRIN as per chart.  CURRENT MEDICATIONS: 1. Aspirin. 2. Heparin 5000 units subcu t.i.d. 3. Sliding scale insulin. 4. Ativan p.r.n. 5. Mupirocin twice daily for next 5 days. 6. Protonix. 7. Normal saline 800 mL an hour.  The patient's home meds: 1. Norvasc 10 mg daily. 2. Aspirin 81 mg daily. 3. Lipitor 80 mg daily. 4. Clonidine 0.3 mg twice daily. 5. Lasix 80 mg p.o. daily. 6. Glucotrol 10 mg twice daily. 7. Hydralazine 25 mg 3 times a day. 8. Metoprolol 25 mg twice a day. 9. Zofran as needed 4 mg tablet. 10.Zantac 150 mg twice daily. 11.Januvia 25 mg once daily.  REVIEW OF SYSTEMS:  Negative other than the one which has been mentioned above in the HPI.  PHYSICAL EXAMINATION:  VITAL SIGNS:  Temperature of 98.0, pulse 99-113, blood pressure 173-190/79-96, oxygen saturation 100% on 2 liters nasal cannula.  Total ins and outs  positive 2 liters in the last 24 hours. Urine output of 370 mL in the last 24 hours. GENERAL:  Sedated, sleeping, in no acute distress. HEENT:  Head and neck normocephalic and atraumatic.  Sclerae are clear. Extraocular movements intact. NECK:  Supple.  No JVD.  Lymphadenopathy is not noted. CARDIOVASCULAR:  Regular rate and rhythm.  S1 and S2 is normal, 3/6 ejection systolic murmur best heard at the left sternal border, 2+ equal pulses bilaterally.  There is trace edema bilateral lower extremities. LUNGS:  Clear to auscultation bilaterally anteriorly.  No crackles heard. SKIN:  No rashes or lesions. EXTREMITIES:  Trace edema noted.  There is AV fistula on the right forearm.  MUSCULOSKELETAL:  No joint deformity. NEUROLOGIC:  Unable to assess.  LABORATORY DATA:  White cell count 13.2, hemoglobin 9.7, hematocrit 30.4, platelets 138.   Sodium 153, potassium 5.1, chloride 123, bicarb 17, BUN 98, creatinine 8.28, glucose 114, calcium 8.7, phosphate 7.0, magnesium 2.2.  AST 159, ALT 244, total bilirubin 1.0, total protein 6.3, albumin 3.0, alkaline phosphatase 103.  Parathyroid hormone done on July 21, 2010 was 64.  Total CK 221, CPK-MB 6.8, relative index 3.1, troponin 0.44.  Chest x-ray consistent with increased density, which is consistent with edema.  MRI/MRA done on November 06, 2010:  No acute infarct and questionable acute sinusitis.  ASSESSMENT AND PLAN:  The patient is an 77 year old woman with chronic kidney disease, stage IV currently not on dialysis has a left arteriovenous fistula, which was put on September 25, 2010 in preparation for starting dialysis, was admitted 2 days ago with acute altered mental status, questionably secondary to stroke and now with acute kidney injury.  1. Acute kidney injury on chronic kidney disease patient with a     potassium of 5.1, creatinine 8.28, and bicarb of 17.  Definitely     concerned about the patient's current electrolyte and hemodynamic     status.  The patient may need to proceed with dialysis tomorrow     morning.  The patient does not have a hemodialysis catheter placed     at this point of time, and we would consult Interventional     Radiology asking them to place a Diatek catheter so that it can be     used tomorrow.  The patient is currently on normal saline 100 mL an     hour, which we would like to stop at this point of time as the     patient appears volume overloaded.  We would start the patient on     160 mg of IV Lasix 3 times a day, which would help getting the     potassium down and also give [place] some time until dialysis tomorrow     morning.  This most likely seems to be progression of her chronic     kidney disease.  We discussed the start of the patient's dialysis     with power of attorney, which is the patient's one of the nieces,     who is on her  way from [place].  The patient's niece is agreeable     to putting a Diatek catheter at this point of time and starting     dialysis tomorrow morning. 2. Hypernatremia.  The patient is on normal saline drip.  We will     discontinue drip at this time. 3. Hyperchloremia, which is secondary to normal saline drip.  We will     discontinue the normal saline  drip at this point of time. 4. Anemia.  The patient is currently anemic with a hemoglobin of 9.7.     We would obtain anemia panel and look at percentage saturation and     total iron and would repeat Epo and iron based on the results of     the anemia panel.  The patient's parathyroid hormone was 64 in a dialysis patient, we would like to see that above 120 usually.  We would repeat parathyroid hormone levels at this point of time and since the patient's phosphorus is already creeping up, would like to add PhosLo to her p.o. meds as soon as the patient starts eating.  We would get her renal panel in the morning, and we will follow up the patient along with the family physicians.  Dr. Hyman Hopes assisted and was consulted for this patient.  Thank you for your consult.     Lars Mage, MD   ______________________________ Garnetta Buddy, M.D.    AG/MEDQ  D:  11/06/2010  T:  11/07/2010  Job:  [id number]  Electronically Signed by Lars Mage  on 12/21/2010 08:16:56 PM Electronically Signed by Elvis Coil M.D. on 01/20/2011 11:33:24 AM

## 2011-01-23 ENCOUNTER — Ambulatory Visit (HOSPITAL_COMMUNITY)
Admission: RE | Admit: 2011-01-23 | Discharge: 2011-01-23 | Disposition: A | Discharge status: Home / Self Care | Payer: Medicare FFS | Source: Ambulatory Visit | Attending: Vascular Surgery | Admitting: Vascular Surgery

## 2011-01-23 DIAGNOSIS — E119 Type 2 diabetes mellitus without complications: Secondary | ICD-10-CM | POA: Insufficient documentation

## 2011-01-23 DIAGNOSIS — N186 End stage renal disease: Secondary | ICD-10-CM | POA: Insufficient documentation

## 2011-01-23 DIAGNOSIS — J45909 Unspecified asthma, uncomplicated: Secondary | ICD-10-CM | POA: Insufficient documentation

## 2011-01-23 DIAGNOSIS — Z01812 Encounter for preprocedural laboratory examination: Secondary | ICD-10-CM | POA: Insufficient documentation

## 2011-01-23 DIAGNOSIS — Z5309 Procedure and treatment not carried out because of other contraindication: Secondary | ICD-10-CM | POA: Insufficient documentation

## 2011-01-23 DIAGNOSIS — I120 Hypertensive chronic kidney disease with stage 5 chronic kidney disease or end stage renal disease: Secondary | ICD-10-CM

## 2011-01-23 DIAGNOSIS — T82598A Other mechanical complication of other cardiac and vascular devices and implants, initial encounter: Secondary | ICD-10-CM | POA: Insufficient documentation

## 2011-01-23 DIAGNOSIS — T82898A Other specified complication of vascular prosthetic devices, implants and grafts, initial encounter: Secondary | ICD-10-CM

## 2011-01-23 DIAGNOSIS — Y832 Surgical operation with anastomosis, bypass or graft as the cause of abnormal reaction of the patient, or of later complication, without mention of misadventure at the time of the procedure: Secondary | ICD-10-CM | POA: Insufficient documentation

## 2011-01-23 DIAGNOSIS — E785 Hyperlipidemia, unspecified: Secondary | ICD-10-CM | POA: Insufficient documentation

## 2011-01-23 DIAGNOSIS — Z992 Dependence on renal dialysis: Secondary | ICD-10-CM | POA: Insufficient documentation

## 2011-01-23 LAB — POCT I-STAT, CHEM 8
Creatinine, Ser: 5.6 mg/dL — ABNORMAL HIGH (ref 0.4–1.2)
Glucose, Bld: 139 mg/dL — ABNORMAL HIGH (ref 70–99)
Hemoglobin: 15.3 g/dL — ABNORMAL HIGH (ref 12.0–15.0)
Sodium: 137 mEq/L (ref 135–145)
TCO2: 28 mmol/L (ref 0–100)

## 2011-01-30 ENCOUNTER — Ambulatory Visit (HOSPITAL_COMMUNITY)
Admission: RE | Admit: 2011-01-30 | Discharge: 2011-01-30 | Disposition: A | Discharge status: Home / Self Care | Payer: Medicare FFS | Source: Ambulatory Visit | Attending: Vascular Surgery | Admitting: Vascular Surgery

## 2011-01-30 DIAGNOSIS — T82898A Other specified complication of vascular prosthetic devices, implants and grafts, initial encounter: Secondary | ICD-10-CM | POA: Insufficient documentation

## 2011-01-30 DIAGNOSIS — I10 Essential (primary) hypertension: Secondary | ICD-10-CM | POA: Insufficient documentation

## 2011-01-30 DIAGNOSIS — Y832 Surgical operation with anastomosis, bypass or graft as the cause of abnormal reaction of the patient, or of later complication, without mention of misadventure at the time of the procedure: Secondary | ICD-10-CM | POA: Insufficient documentation

## 2011-01-30 DIAGNOSIS — I120 Hypertensive chronic kidney disease with stage 5 chronic kidney disease or end stage renal disease: Secondary | ICD-10-CM

## 2011-01-30 DIAGNOSIS — N186 End stage renal disease: Secondary | ICD-10-CM

## 2011-01-30 DIAGNOSIS — E119 Type 2 diabetes mellitus without complications: Secondary | ICD-10-CM | POA: Insufficient documentation

## 2011-01-30 LAB — POCT I-STAT, CHEM 8
Calcium, Ion: 1.07 mmol/L — ABNORMAL LOW (ref 1.12–1.32)
Glucose, Bld: 127 mg/dL — ABNORMAL HIGH (ref 70–99)
HCT: 44 % (ref 36.0–46.0)
Hemoglobin: 15 g/dL (ref 12.0–15.0)
Potassium: 3.4 mEq/L — ABNORMAL LOW (ref 3.5–5.1)

## 2011-01-30 NOTE — Op Note (Signed)
NAMEJONELLE, BANN NO.:  [id number]  MEDICAL RECORD NO.:  [id number]           PATIENT TYPE:  O  LOCATION:  [facility]                         FACILITY:  [facility]  PHYSICIAN:  Merlina Marchena E. Danny Zimny, MD  DATE OF BIRTH:  08-Dec-1926  DATE OF PROCEDURE:  01/23/2011 DATE OF DISCHARGE:  01/23/2011                              OPERATIVE REPORT   PROCEDURE:  Left upper extremity fistulogram.  PREOPERATIVE DIAGNOSIS:  Non-maturing left arm arteriovenous fistula.  POSTOPERATIVE DIAGNOSIS:  Non-maturing left arm arteriovenous fistula.  ANESTHESIA:  Local.  OPERATIVE DETAILS:  After obtaining informed consent, the patient was taken to the PV Lab.  The patient was placed in the supine position on the angio table.  The patient's left upper extremity was prepped and draped in usual sterile fashion.  Next, local anesthesia was infiltrated over the midportion of the left upper arm AV fistula.  Ultrasound was used to identify the fistula.  A micropuncture set was then used in an attempt to cannulate the fistula.  However, the micropuncture needle initially punctured the back wall of the fistula.  I tried to maneuver the needle to get this back in the lumen channel, but the guidewire would not pass freely into the fistula.  She began to get a hematoma in this location.  Therefore, the needle and guidewire were removed and hemostasis obtained with direct pressure.  The hematoma had essentially resolved at this point.  It was decided at this point to stick the fistula in the more wide portion proximally to obtain diagnostic pictures.  The fistula was easily palpable at the antecubital crease. Local anesthesia was infiltrated in this area and the micropuncture needle was used to cannulate the fistula without difficulty.  I was unable to advance the 0.18 guidewire of the micropuncture sheath into the fistula and then placed a micropuncture sheath over this.  Multiple views of the fistula  were then obtained.  This shows a widely patent left subclavian and innominate vein.  The left axillary vein is patent. The left cephalic vein is patent throughout its course.  There is a 50% stenosis of the fistula in the distal third of the humerus.  A blood pressure cuff was then inflated in the upper arm to reflux contrast across the proximal aspect of the fistula and the arterial anastomosis. There is a high-grade stenosis approximately 80%, 4 cm after the takeoff of the fistula.  This extends over several centimeters.  The arterial anastomosis is patent and there was good reflux of contrast into the brachial artery.  At this point, an attempt was again made to stick the fistula more distally to try to do an angioplasty of the proximal stenosis.  Several passes were made with a needle and I was unable to successfully advance the guidewire again due to the small size of the fistula distally. After several attempts, further attempts were abandoned and the procedure was aborted at this point.  The patient will be rescheduled to come back for angioplasty of proximal portion of the fistula in 1 week. There was no significant hematoma from the needle punctures  above, but the patient did experience some discomfort from the multiple needle punctures and it was decided to abandon attempts at this point.  The patient tolerated the procedure well, otherwise and will be rescheduled for next week.  OPERATIVE FINDINGS: 1. Patent distal outflow and central veins, left arm. 2. 80% stenosis of proximal fistula, approximately 3-4 cm from the     arterial anastomosis. 3. 40% stenosis of the distal third of the cephalic vein proximally     40%.     Janetta Hora. Evangelyne Loja, MD     CEF/MEDQ  D:  01/23/2011  T:  01/24/2011  Job:  [id number]  Electronically Signed by Fabienne Bruns MD on 01/30/2011 09:26:09 AM

## 2011-02-15 NOTE — Op Note (Signed)
  NAMETANNER, VIGNA NO.:  [id number]  MEDICAL RECORD NO.:  [id number]  LOCATION:  [facility]                         FACILITY:  [facility]  PHYSICIAN:  Janetta Hora. Jaclynne Baldo, MD  DATE OF BIRTH:  10-14-1926  DATE OF PROCEDURE:  01/30/2011 DATE OF DISCHARGE:  01/30/2011                              OPERATIVE REPORT   PROCEDURE:  Attempted left upper arm fistulogram and angioplasty.  PREOPERATIVE DIAGNOSIS:  High-grade stenosis left upper arm arteriovenous fistula.  POSTOPERATIVE DIAGNOSIS:  High-grade stenosis left upper arm arteriovenous fistula.  ANESTHESIA:  Local.  OPERATIVE DETAILS:  After obtaining informed consent, the patient was taken to the PV lab.  The patient was placed in supine position on the angio table.  The patient's entire left upper extremity was prepped and draped in usual sterile fashion.  Ultrasound was used to identify the left upper arm fistula over the area of the humerus.  The fistula was quite small.  A micropuncture needle was used to cannulate the fistula after infiltration of local anesthesia.  This was done with some difficulty again due to the small diameter of the fistula.  I was able to successfully cannulate the fistula and advance a guidewire into the proximal aspect of the fistula.  However, when trying to place the dilator and sheath of the micropuncture set over the guidewire, the wire began to kink and the sheath would not pass easily and the patient began to develop a hematoma.  At this point, all our attempts were aborted to work on the left upper extremity fistula.  The patient will be scheduled for operative repair of her AV fistula versus placement of AV graft in the near future.  Hemostasis was obtained with direct pressure.  The patient tolerated the procedure well, otherwise, and was taken back to short-stay in stable condition.     Janetta Hora. Patrena Santalucia, MD     CEF/MEDQ  D:  01/30/2011  T:  01/31/2011  Job:   [id number]  Electronically Signed by Fabienne Bruns MD on 02/15/2011 12:33:56 PM

## 2011-02-16 ENCOUNTER — Ambulatory Visit (HOSPITAL_COMMUNITY): Payer: Medicare FFS

## 2011-02-16 ENCOUNTER — Ambulatory Visit (HOSPITAL_COMMUNITY)
Admission: RE | Admit: 2011-02-16 | Discharge: 2011-02-16 | Disposition: A | Discharge status: Home / Self Care | Payer: Medicare FFS | Source: Ambulatory Visit | Attending: Vascular Surgery | Admitting: Vascular Surgery

## 2011-02-16 DIAGNOSIS — I509 Heart failure, unspecified: Secondary | ICD-10-CM | POA: Insufficient documentation

## 2011-02-16 DIAGNOSIS — N186 End stage renal disease: Secondary | ICD-10-CM | POA: Insufficient documentation

## 2011-02-16 DIAGNOSIS — Y832 Surgical operation with anastomosis, bypass or graft as the cause of abnormal reaction of the patient, or of later complication, without mention of misadventure at the time of the procedure: Secondary | ICD-10-CM | POA: Insufficient documentation

## 2011-02-16 DIAGNOSIS — H409 Unspecified glaucoma: Secondary | ICD-10-CM | POA: Insufficient documentation

## 2011-02-16 DIAGNOSIS — I120 Hypertensive chronic kidney disease with stage 5 chronic kidney disease or end stage renal disease: Secondary | ICD-10-CM | POA: Insufficient documentation

## 2011-02-16 DIAGNOSIS — Z8673 Personal history of transient ischemic attack (TIA), and cerebral infarction without residual deficits: Secondary | ICD-10-CM | POA: Insufficient documentation

## 2011-02-16 DIAGNOSIS — T82598A Other mechanical complication of other cardiac and vascular devices and implants, initial encounter: Secondary | ICD-10-CM | POA: Insufficient documentation

## 2011-02-16 DIAGNOSIS — J45909 Unspecified asthma, uncomplicated: Secondary | ICD-10-CM | POA: Insufficient documentation

## 2011-02-16 DIAGNOSIS — E119 Type 2 diabetes mellitus without complications: Secondary | ICD-10-CM | POA: Insufficient documentation

## 2011-02-16 DIAGNOSIS — K219 Gastro-esophageal reflux disease without esophagitis: Secondary | ICD-10-CM | POA: Insufficient documentation

## 2011-02-16 DIAGNOSIS — T82898A Other specified complication of vascular prosthetic devices, implants and grafts, initial encounter: Secondary | ICD-10-CM

## 2011-02-16 HISTORY — PX: AV FISTULA REPAIR: SHX563

## 2011-02-16 LAB — SURGICAL PCR SCREEN: Staphylococcus aureus: NEGATIVE

## 2011-02-16 LAB — GLUCOSE, CAPILLARY
Glucose-Capillary: 133 mg/dL — ABNORMAL HIGH (ref 70–99)
Glucose-Capillary: 87 mg/dL (ref 70–99)

## 2011-02-18 ENCOUNTER — Encounter: Payer: Self-pay | Admitting: *Deleted

## 2011-02-19 ENCOUNTER — Telehealth: Payer: Self-pay | Admitting: Family Medicine

## 2011-02-19 NOTE — Telephone Encounter (Signed)
Spoke with Darel Hong and informed her that I did not see the fax order for ms. Shubert. She will refax to my attention.[name], Viann Shove

## 2011-02-19 NOTE — Telephone Encounter (Signed)
Order faxed back to [phone].[name], Viann Shove

## 2011-02-19 NOTE — Telephone Encounter (Signed)
This encounter was created in error - please disregard.

## 2011-02-19 NOTE — Telephone Encounter (Signed)
Darel Hong from [employer] called regarding paperwork that was faxed for her Diabetic Testing supplies.

## 2011-02-26 ENCOUNTER — Encounter: Payer: Self-pay | Admitting: Vascular Surgery

## 2011-03-09 NOTE — Op Note (Signed)
  Claudia Rich, Claudia [name]:  [id number]  MEDICAL RECORD NO.:  [id number]  LOCATION:  [facility]                         FACILITY:  [facility]  PHYSICIAN:  Janetta Hora. Vincenza Dail, MD  DATE OF BIRTH:  1927/02/03  DATE OF PROCEDURE:  02/16/2011 DATE OF DISCHARGE:  02/16/2011                              OPERATIVE REPORT   PROCEDURE:  Revision of left brachiocephalic arteriovenous fistula with patch angioplasty of left brachiocephalic fistula.  PREOPERATIVE DIAGNOSIS:  Not maturing arteriovenous fistula, left arm.  POSTOPERATIVE DIAGNOSIS:  Not maturing arteriovenous fistula, left arm.  ANESTHESIA:  General.  SURGEON:  Dior Dominik E. Vivek Grealish, MD  ASSISTANT:  Newton Pigg, PA  OPERATIVE FINDINGS:  High-grade stenosis of proximal left brachiocephalic arteriovenous fistula, repaired with bovine pericardial patch.  OPERATIVE DETAILS:  After obtaining informed consent, the patient was taken to the operating room.  The patient was placed in supine position on the operating room table.  After induction of general anesthesia and placement of laryngeal mask, the patient's entire left upper extremity was prepped and draped in usual sterile fashion.  Next, a curvilinear incision was made near the left antecubital crease and carried up partially onto the upper arm.  Incision was carried down through the subcutaneous tissues down to the level of fistula.  Preoperative fistulogram had showed a high-grade stenosis just after the takeoff of the arterial anastomosis.  This portion of the vein was dissected free circumferentially.  It was thickened on palpation.  Dissection was carried up above the area of narrowing to a more normal-appearing segment of vein and this was dissected free circumferentially and vessel loop was placed around this.  The patient was given 5000 units of intravenous heparin.  The fistula was clamped proximal and distal to the site of narrowing.  Longitudinal  opening was made in the vein, and there was a high-grade stenosis with minimal hyperplasia.  Potts scissors were used to extend the arteriotomy past the level of stenosis.  Bovine pericardial patch was then brought up in the operative field and sewn as patch angioplasty into good vein below and above the area of stenosis. Just prior to completion of anastomosis, this was fore bled and back bled and thoroughly flushed.  Anastomosis was secured, clamps were released,  there was a palpable thrill in the fistula immediately. Hemostasis was obtained.  The patient was also given 50 mg of protamine. Subcutaneous tissues were reapproximated using running 3-0 Vicryl suture.  Skin was closed with 4-0 Vicryl subcuticular stitch.  The patient tolerated the procedure well and there were no complications.  Instrument, sponge, and needle counts were correct at the end of the case.  The patient was taken to recovery room in stable condition.     Janetta Hora. Richardine Peppers, MD     CEF/MEDQ  D:  02/17/2011  T:  02/17/2011  Job:  [id number]  Electronically Signed by Fabienne Bruns MD on 03/09/2011 09:48:26 AM

## 2011-03-26 ENCOUNTER — Ambulatory Visit: Payer: Medicare FFS | Admitting: Vascular Surgery

## 2011-04-09 ENCOUNTER — Ambulatory Visit (INDEPENDENT_AMBULATORY_CARE_PROVIDER_SITE_OTHER): Payer: Medicare FFS | Admitting: Vascular Surgery

## 2011-04-09 ENCOUNTER — Encounter: Payer: Self-pay | Admitting: Vascular Surgery

## 2011-04-09 VITALS — BP 149/65 | HR 78 | Resp 20 | Ht 65.0 in | Wt 143.7 lb

## 2011-04-09 DIAGNOSIS — N186 End stage renal disease: Secondary | ICD-10-CM

## 2011-04-09 NOTE — Progress Notes (Signed)
Subjective:     Patient ID: Claudia Rich, female   DOB: 14-Oct-1926, 77 y.o.   MRN: [id number]  HPI Patient returns today for followup evaluation of her left brachiocephalic AV fistula. This was recently placed in February 2012. She was noted on recent fistulogram have a stenosis. She had one any. Treated with angioplasty. She had a patch angioplasty in the operating room at the other area 02/16/11. She returns today for further followup. She is currently dialyzing via a right-sided catheter. She dialyzes on Tuesday Thursday and Saturday. She is having no problems with catheter. She was in [place] recently and the nephrologist wanted to stick the fistula however she refused.   Review of Systems Denies shortness of breath or chest pain    Objective:   Physical Exam Blood pressure 149/65, pulse 78, resp. rate 20, height 5\' 5"  (1.651 m), weight 143 lb 11.2 oz (65.182 kg).  Left upper extremity: She has an easily palpable thrill in the fistula. The fistula is palpable from the antecubital area all the way level the shoulder. It is more dilated proximally and distally. However it leave it is at a stage that we can probably cannulate     Assessment:     Maturing AV fistula left arm    Plan:     Cannulate fistula, if fails consider graft

## 2011-04-17 ENCOUNTER — Telehealth: Payer: Self-pay | Admitting: Family Medicine

## 2011-04-17 NOTE — Telephone Encounter (Signed)
Pt dropped off form to be completed for in-home care, given to [facility] for any clinical completion.

## 2011-04-17 NOTE — Telephone Encounter (Signed)
Form placed in MD box for completion 

## 2011-04-20 NOTE — Telephone Encounter (Signed)
Spoke with [name] , niece and advised that form is ready to pick up.

## 2011-04-29 ENCOUNTER — Other Ambulatory Visit: Payer: Self-pay | Admitting: Family Medicine

## 2011-04-29 MED ORDER — ATORVASTATIN CALCIUM 80 MG PO TABS: 80.0000 mg | ORAL_TABLET | Freq: Every day | ORAL | Status: DC

## 2011-05-06 ENCOUNTER — Other Ambulatory Visit (HOSPITAL_COMMUNITY): Payer: Self-pay | Admitting: Nephrology

## 2011-05-06 DIAGNOSIS — N186 End stage renal disease: Secondary | ICD-10-CM

## 2011-05-13 ENCOUNTER — Ambulatory Visit (HOSPITAL_COMMUNITY): Payer: Medicare FFS

## 2011-05-26 LAB — POCT CARDIAC MARKERS
CKMB, poc: <1 — ABNORMAL LOW
Myoglobin, poc: 169
Troponin i, poc: <0.05

## 2011-05-26 LAB — GLUCOSE, CAPILLARY
Glucose-Capillary: 101 — ABNORMAL HIGH
Glucose-Capillary: 140 — ABNORMAL HIGH

## 2011-05-26 LAB — DIFFERENTIAL
Basophils Absolute: 0
Lymphocytes Relative: 26
Neutro Abs: 4.3

## 2011-05-26 LAB — URINALYSIS, ROUTINE W REFLEX MICROSCOPIC
Ketones, ur: NEGATIVE
Leukocytes, UA: NEGATIVE
Nitrite: NEGATIVE
Protein, ur: 30 — AB
Urobilinogen, UA: 0.2
pH: 6

## 2011-05-26 LAB — BASIC METABOLIC PANEL
Chloride: 103
Creatinine, Ser: 1.71 — ABNORMAL HIGH
GFR calc Af Amer: 35 — ABNORMAL LOW
Potassium: 3.6
Sodium: 142

## 2011-05-26 LAB — LIPID PANEL
Cholesterol: 118
LDL Cholesterol: 48
Triglycerides: 59

## 2011-05-26 LAB — APTT: aPTT: 24

## 2011-05-26 LAB — URINE MICROSCOPIC-ADD ON

## 2011-05-26 LAB — B-NATRIURETIC PEPTIDE (CONVERTED LAB): Pro B Natriuretic peptide (BNP): <30

## 2011-05-26 LAB — HEMOGLOBIN A1C
Hgb A1c MFr Bld: 7.5 — ABNORMAL HIGH
Mean Plasma Glucose: 169

## 2011-05-26 LAB — POCT I-STAT, CHEM 8
BUN: 34 — ABNORMAL HIGH
Creatinine, Ser: 1.9 — ABNORMAL HIGH
Glucose, Bld: 168 — ABNORMAL HIGH
Hemoglobin: 11.9 — ABNORMAL LOW
Potassium: 3.8
TCO2: 27

## 2011-05-26 LAB — CBC
Platelets: 167
RDW: 13.6

## 2011-05-26 LAB — PROTIME-INR: Prothrombin Time: 13.1

## 2011-05-29 ENCOUNTER — Other Ambulatory Visit (HOSPITAL_COMMUNITY): Payer: Self-pay | Admitting: Nephrology

## 2011-05-29 DIAGNOSIS — N186 End stage renal disease: Secondary | ICD-10-CM

## 2011-05-29 DIAGNOSIS — T82858A Stenosis of vascular prosthetic devices, implants and grafts, initial encounter: Secondary | ICD-10-CM

## 2011-06-08 ENCOUNTER — Ambulatory Visit (HOSPITAL_COMMUNITY)
Admission: RE | Admit: 2011-06-08 | Discharge: 2011-06-08 | Disposition: A | Discharge status: Home / Self Care | Payer: Medicare FFS | Source: Ambulatory Visit | Attending: Nephrology | Admitting: Nephrology

## 2011-06-08 DIAGNOSIS — N186 End stage renal disease: Secondary | ICD-10-CM | POA: Insufficient documentation

## 2011-06-08 DIAGNOSIS — E119 Type 2 diabetes mellitus without complications: Secondary | ICD-10-CM | POA: Insufficient documentation

## 2011-06-08 DIAGNOSIS — Z8673 Personal history of transient ischemic attack (TIA), and cerebral infarction without residual deficits: Secondary | ICD-10-CM | POA: Insufficient documentation

## 2011-06-08 DIAGNOSIS — E785 Hyperlipidemia, unspecified: Secondary | ICD-10-CM | POA: Insufficient documentation

## 2011-06-08 DIAGNOSIS — T82898A Other specified complication of vascular prosthetic devices, implants and grafts, initial encounter: Secondary | ICD-10-CM | POA: Insufficient documentation

## 2011-06-08 DIAGNOSIS — I739 Peripheral vascular disease, unspecified: Secondary | ICD-10-CM | POA: Insufficient documentation

## 2011-06-08 DIAGNOSIS — T82858A Stenosis of vascular prosthetic devices, implants and grafts, initial encounter: Secondary | ICD-10-CM

## 2011-06-08 DIAGNOSIS — K5730 Diverticulosis of large intestine without perforation or abscess without bleeding: Secondary | ICD-10-CM | POA: Insufficient documentation

## 2011-06-08 DIAGNOSIS — Z860100 Personal history of colon polyps, unspecified: Secondary | ICD-10-CM | POA: Insufficient documentation

## 2011-06-08 DIAGNOSIS — N2581 Secondary hyperparathyroidism of renal origin: Secondary | ICD-10-CM | POA: Insufficient documentation

## 2011-06-08 DIAGNOSIS — D509 Iron deficiency anemia, unspecified: Secondary | ICD-10-CM | POA: Insufficient documentation

## 2011-06-08 DIAGNOSIS — K219 Gastro-esophageal reflux disease without esophagitis: Secondary | ICD-10-CM | POA: Insufficient documentation

## 2011-06-08 DIAGNOSIS — I120 Hypertensive chronic kidney disease with stage 5 chronic kidney disease or end stage renal disease: Secondary | ICD-10-CM | POA: Insufficient documentation

## 2011-06-08 MED ORDER — IOHEXOL 300 MG/ML  SOLN
100.0000 mL | Freq: Once | INTRAMUSCULAR | Status: AC | PRN
Start: 2011-06-08 — End: 2011-06-08
  Administered 2011-06-08: 48 mL via INTRAVENOUS

## 2011-06-11 ENCOUNTER — Telehealth: Payer: Self-pay | Admitting: Family Medicine

## 2011-06-11 NOTE — Telephone Encounter (Signed)
Form dropped off to be filled out and faxed back. She said they had faxed it to [place] on 05/21/11, but never received anything.  I placed it in your box.

## 2011-06-15 ENCOUNTER — Other Ambulatory Visit: Payer: Self-pay

## 2011-06-15 DIAGNOSIS — N189 Chronic kidney disease, unspecified: Secondary | ICD-10-CM

## 2011-06-15 DIAGNOSIS — Z01810 Encounter for preprocedural cardiovascular examination: Secondary | ICD-10-CM

## 2011-06-18 ENCOUNTER — Ambulatory Visit: Payer: Medicare FFS | Admitting: Family Medicine

## 2011-06-26 ENCOUNTER — Ambulatory Visit: Payer: Medicare FFS | Admitting: Family Medicine

## 2011-07-08 ENCOUNTER — Ambulatory Visit: Payer: Medicare FFS | Admitting: Vascular Surgery

## 2011-07-08 ENCOUNTER — Other Ambulatory Visit: Payer: Medicare FFS

## 2011-07-09 ENCOUNTER — Encounter: Payer: Self-pay | Admitting: Vascular Surgery

## 2011-07-10 ENCOUNTER — Ambulatory Visit (INDEPENDENT_AMBULATORY_CARE_PROVIDER_SITE_OTHER): Payer: Medicare FFS | Admitting: Family Medicine

## 2011-07-10 ENCOUNTER — Encounter: Payer: Self-pay | Admitting: Family Medicine

## 2011-07-10 VITALS — BP 162/83 | HR 98 | Temp 98.0°F | Ht 64.0 in | Wt 140.0 lb

## 2011-07-10 DIAGNOSIS — E119 Type 2 diabetes mellitus without complications: Secondary | ICD-10-CM

## 2011-07-10 DIAGNOSIS — I10 Essential (primary) hypertension: Secondary | ICD-10-CM

## 2011-07-10 DIAGNOSIS — R42 Dizziness and giddiness: Secondary | ICD-10-CM | POA: Insufficient documentation

## 2011-07-10 MED ORDER — SITAGLIPTIN PHOSPHATE 25 MG PO TABS: 25.0000 mg | ORAL_TABLET | Freq: Every day | ORAL | Status: DC

## 2011-07-10 MED ORDER — AMLODIPINE BESYLATE 10 MG PO TABS: 10.0000 mg | ORAL_TABLET | Freq: Every day | ORAL | Status: DC

## 2011-07-10 MED ORDER — METOPROLOL TARTRATE 25 MG PO TABS: 25.0000 mg | ORAL_TABLET | Freq: Two times a day (BID) | ORAL | Status: DC

## 2011-07-10 MED ORDER — ATORVASTATIN CALCIUM 80 MG PO TABS: 80.0000 mg | ORAL_TABLET | Freq: Every day | ORAL | Status: DC

## 2011-07-10 MED ORDER — GLIPIZIDE 10 MG PO TABS: 10.0000 mg | ORAL_TABLET | Freq: Two times a day (BID) | ORAL | Status: DC

## 2011-07-10 MED ORDER — ASPIRIN 81 MG PO CHEW: 81.0000 mg | CHEWABLE_TABLET | Freq: Every day | ORAL | Status: DC

## 2011-07-10 NOTE — Patient Instructions (Signed)
It was great to see you today!  Schedule an appointment to see me in 3 months.   Keep working on your blood pressure control and your diabetes.  Call me if you become dizzy with restarting your medications.

## 2011-07-10 NOTE — Progress Notes (Signed)
  Subjective:    Patient ID: Claudia Rich, female    DOB: 1926/12/02, 77 y.o.   MRN: [id number]  HPI 1. Dizziness/Lightheaded Secondary to taking discontinued medication in her pill box. Catapres. This has since been discarded. She has not had any more spells.  2. HTN Hypertension ROS: medication side effect: dizziness without fall. Patient was taking catapress from her pill box even though this was dc'd several months ago. She subsequently stopped talking all her medications for three weeks. No headache, no visual changes, no chest pain, no leg swelling.  New concerns: dizzy spells.   3. DM2  77 y.o. female for follow up of diabetes. Diabetic Review of Systems - medication compliance: compliant most of the time, but hasnt taken meds in the last 3 weeks, diabetic diet compliance: compliant most of the time, home glucose monitoring: is performed at dialysis three times a week. Max: 231, further diabetic ROS: no polyuria or polydipsia, no chest pain, dyspnea or TIA's, no numbness, tingling or pain in extremities, no unusual visual symptoms.  Other symptoms and concerns: none.  Review of Systems Negative or as above. No fever, chills, chest pain, headache, dysuria, falls, sob, leg swelling.    Objective:   Physical Exam BP 162/83  Pulse 98  Temp(Src) 98 F (36.7 C) (Oral)  Ht 5\' 4"  (1.626 m)  Wt 140 lb (63.504 kg)  BMI 24.03 kg/m2 Appearance alert, well appearing, and in no distress. General exam BP noted to be moderately elevated today in office, S1, S2 normal, no gallop, no murmur, chest clear, no JVD, no HSM, no edema.  Lab review: labs are reviewed, up to date and normal.  Extremities:  No cyanosis, edema, or deformity noted. Lungs:  Normal respiratory effort, chest expands symmetrically. Lungs are clear to auscultation, no crackles or wheezes.     Assessment & Plan:

## 2011-07-10 NOTE — Assessment & Plan Note (Signed)
Elevated BP today because not taking medications for 3 weeks. The only reason it isnt hypertensive urgency is because of hemodialysis three times a week. Advised patient to restart her medications.

## 2011-07-10 NOTE — Assessment & Plan Note (Signed)
Will restart medications. Catapres removed from pill box and thrown in the trash can. She will call me if these symptoms reoccur.

## 2011-07-10 NOTE — Assessment & Plan Note (Signed)
HgA1c 7.8 - with three weeks without oral hypoglycemics. Advised to continue low sugar diet. She is compliant most of the time. Will follow this up in three weeks.

## 2011-07-15 ENCOUNTER — Other Ambulatory Visit: Payer: Medicare FFS

## 2011-07-15 ENCOUNTER — Ambulatory Visit: Payer: Medicare FFS | Admitting: Vascular Surgery

## 2011-07-22 ENCOUNTER — Encounter: Payer: Self-pay | Admitting: Home Health Services

## 2011-07-28 ENCOUNTER — Encounter: Payer: Self-pay | Admitting: Vascular Surgery

## 2011-07-29 ENCOUNTER — Other Ambulatory Visit (INDEPENDENT_AMBULATORY_CARE_PROVIDER_SITE_OTHER): Payer: Medicare FFS | Admitting: *Deleted

## 2011-07-29 ENCOUNTER — Ambulatory Visit (INDEPENDENT_AMBULATORY_CARE_PROVIDER_SITE_OTHER): Payer: Medicare FFS | Admitting: Vascular Surgery

## 2011-07-29 ENCOUNTER — Encounter: Payer: Self-pay | Admitting: Vascular Surgery

## 2011-07-29 VITALS — BP 146/77 | HR 73 | Temp 98.2°F | Ht 64.0 in | Wt 140.0 lb

## 2011-07-29 DIAGNOSIS — N184 Chronic kidney disease, stage 4 (severe): Secondary | ICD-10-CM

## 2011-07-29 DIAGNOSIS — Z48812 Encounter for surgical aftercare following surgery on the circulatory system: Secondary | ICD-10-CM

## 2011-07-29 NOTE — Progress Notes (Signed)
[facility]  Patient name: Claudia Rich MRN: [id number] DOB: 07-Sep-1926 Sex: female  CC: Evaluate for new access  HPI: Claudia Rich is a 77 y.o. female who had a left brachiocephalic fistula placed on 09/24/2010. He had a fistulogram in June of 2012 which showed an 80% stenosis in the proximal fistula and a 40% stenosis in the distal third of the cephalic vein. Because of the very small size of the vein the patient was not a candidate for any type of intervention. Patient later had a patch angioplasty of the stenosis but ultimately the fistula failed. She was sent for evaluation for new access.  The patient dialyzes on Tuesdays Thursdays and Saturdays. She's had no recent uremic symptoms. Specifically she denies nausea, vomiting, palpitations, fatigue, or anorexia.  Past Medical History  Diagnosis Date  . Chronic kidney disease   . Hypertension   . Diabetes mellitus   . Hyperlipidemia   . GERD (gastroesophageal reflux disease)   . Anemia   . COPD (chronic obstructive pulmonary disease)   . Peripheral vascular disease   . Glaucoma   . Hammer toe   . Internal hemorrhoids   . Diverticulosis   . Colon polyps     History reviewed. No pertinent family history.  SOCIAL HISTORY: History  Substance Use Topics  . Smoking status: Never Smoker   . Smokeless tobacco: Never Used  . Alcohol Use: No    Allergies  Allergen Reactions  . Aspirin     Upset stomach  . Codeine Sulfate   . Meperidine Hcl     REACTION: vomiting    Current Outpatient Prescriptions  Medication Sig Dispense Refill  . amLODipine (NORVASC) 10 MG tablet Take 1 tablet (10 mg total) by mouth daily.  30 tablet  11  . aspirin ([employer]IL[employer] ASPIRIN) 81 MG chewable tablet Chew 1 tablet (81 mg total) by mouth daily.  30 tablet  11  . atorvastatin (LIPITOR) 80 MG tablet Take 1 tablet (80 mg total) by mouth at bedtime.  30 tablet  11  . B Complex-C-Folic Acid (RENAL MULTIVITAMIN FORMULA  PO) Take 10 mg by mouth daily.        . calcium acetate (PHOSLO) 667 MG capsule Take 667 mg by mouth 2 (two) times daily.        Marland Kitchen glipiZIDE (GLUCOTROL) 10 MG tablet Take 1 tablet (10 mg total) by mouth 2 (two) times daily before a meal. Take 2 tablets two times/day  60 tablet  11  . metoprolol tartrate (LOPRESSOR) 25 MG tablet Take 1 tablet (25 mg total) by mouth 2 (two) times daily. Take one tablet twice/day on nondialysis days, and take one tablet nightly on Tues., Thurs., Sat.  For blood pressure  60 tablet  11  . ranitidine (ZANTAC) 150 MG capsule Take 150 mg by mouth 2 (two) times daily. For stomach ulcer       . sitaGLIPtin (JANUVIA) 25 MG tablet Take 1 tablet (25 mg total) by mouth daily.  90 tablet  11  . VENTOLIN HFA 108 (90 BASE) MCG/ACT inhaler Inhale 2 puffs into the lungs every 6 (six) hours as needed.       Marland Kitchen glucose blood (ACCU-CHEK COMFORT CURVE) test strip 1 each by Other route as needed. Check blood sugar 3x daily. Disp 102 strips.       . ondansetron (ZOFRAN) 4 MG tablet Take 4 mg by mouth every 8 (eight) hours as needed.        Marland Kitchen  PEG 3350-KCl-NaBcb-NaCl-NaSulf (PEG 3350/ELECTROLYTES) 240 G SOLR         REVIEW OF SYSTEMS: [name] ] denotes positive finding; [  ] denotes negative finding CARDIOVASCULAR:  [ ]  chest pain   [ ]  chest pressure   [ ]  palpitations   [ ]  orthopnea   [ ]  dyspnea on exertion   [ ]  claudication   [ ]  rest pain   [ ]  DVT   [ ]  phlebitis PULMONARY:   [name] ] productive cough   [name] ] asthma   [ ]  wheezing NEUROLOGIC:   [ ]  weakness  [ ]  paresthesias  [ ]  aphasia  [ ]  amaurosis  [ ]  dizziness HEMATOLOGIC:   [ ]  bleeding problems   [ ]  clotting disorders MUSCULOSKELETAL:  [ ]  joint pain   [ ]  joint swelling [ ]  leg swelling GASTROINTESTINAL: [ ]   blood in stool  [ ]   hematemesis GENITOURINARY:  [ ]   dysuria  [ ]   hematuria PSYCHIATRIC:  [ ]  history of major depression INTEGUMENTARY:  [ ]  rashes  [ ]  ulcers CONSTITUTIONAL:  [ ]  fever   [ ]  chills  PHYSICAL  EXAM: Filed Vitals:   07/29/11 1607  BP: 146/77  Pulse: 73  Temp: 98.2 F (36.8 C)  TempSrc: Oral  Height: 5\' 4"  (1.626 m)  Weight: 140 lb (63.504 kg)  SpO2: 97%   Body mass index is 24.03 kg/(m^2). GENERAL: The patient is a well-nourished female, in no acute distress. The vital signs are documented above. CARDIOVASCULAR: There is a regular rate and rhythm without significant murmur appreciated.  PULMONARY: There is good air exchange bilaterally without wheezing or rales. ABDOMEN: Soft and non-tender with normal pitched bowel sounds.  MUSCULOSKELETAL: There are no major deformities or cyanosis. NEUROLOGIC: No focal weakness or paresthesias are detected. SKIN: There are no ulcers or rashes noted. PSYCHIATRIC: The patient has a normal affect. EXT: the right upper arm AV fistula is thrombosed.  DATA:  I have independently interpreted her vein mapping today which shows that the forearm cephalic vein on the right is fairly small. The upper arm cephalic vein is small but potentially could be used for a fistula. If this is not adequate she could potentially have a basilic vein transposition on the right.  MEDICAL ISSUES:  I have explained the indications for placement of an AV fistula or AV graft. I've explained that if at all possible we will place an AV fistula.  I have reviewed the risks of placement of an AV fistula including but not limited to: failure of the fistula to mature, need for subsequent interventions, and thrombosis. In addition I have reviewed the potential complications of placement of an AV graft. These risks include, but are not limited to, graft thrombosis, graft infection, wound healing problems, bleeding, arm swelling, and steal syndrome. All the patient's questions were answered and they are agreeable to proceed with surgery.  She is scheduled for a right AV fistula on 08/14/11.   Carmelia Tiner S [facility] [place] Office:  [phone]

## 2011-07-31 NOTE — Telephone Encounter (Signed)
Filled out form once again. Placed in PCP box for completion

## 2011-07-31 NOTE — Telephone Encounter (Signed)
Claudia Rich (patients power of attorney) is calling about the form Dr. Rivka Safer faxed back in Oct. They denied services b/c the form was not completely filled out, it was missing the ICD 9 and patients last DOS, they are now requiring a new form to be completed but would not give [name] another copy, they told her we have to go to their website and print one ourselves. Their website is [email or web address]. Please call [name] with any questions. The form must also have on it for pt to receive in home healthcare 3 x per week.

## 2011-08-04 ENCOUNTER — Other Ambulatory Visit: Payer: Self-pay

## 2011-08-07 ENCOUNTER — Other Ambulatory Visit: Payer: Self-pay | Admitting: Family Medicine

## 2011-08-07 NOTE — Telephone Encounter (Signed)
Refill request

## 2011-08-10 ENCOUNTER — Encounter (HOSPITAL_COMMUNITY): Payer: Self-pay

## 2011-08-11 ENCOUNTER — Encounter (HOSPITAL_COMMUNITY): Payer: Self-pay | Admitting: *Deleted

## 2011-08-13 MED ORDER — DEXTROSE 5 % IV SOLN
1.5000 g | INTRAVENOUS | Status: AC
  Administered 2011-08-14: 1.5 g via INTRAVENOUS
  Filled 2011-08-13: qty 1.5

## 2011-08-13 MED ORDER — SODIUM CHLORIDE 0.9 % IV SOLN
INTRAVENOUS | Status: DC
  Administered 2011-08-14: 07:00:00 via INTRAVENOUS

## 2011-08-14 ENCOUNTER — Encounter (HOSPITAL_COMMUNITY)
Admission: RE | Disposition: A | Discharge status: Home / Self Care | Payer: Self-pay | Source: Ambulatory Visit | Attending: Vascular Surgery

## 2011-08-14 ENCOUNTER — Ambulatory Visit (HOSPITAL_COMMUNITY)
Admission: RE | Admit: 2011-08-14 | Discharge: 2011-08-14 | Disposition: A | Discharge status: Home / Self Care | Payer: Medicare HMO | Source: Ambulatory Visit | Attending: Vascular Surgery | Admitting: Vascular Surgery

## 2011-08-14 ENCOUNTER — Ambulatory Visit (HOSPITAL_COMMUNITY): Payer: Medicare HMO | Admitting: Certified Registered"

## 2011-08-14 ENCOUNTER — Encounter (HOSPITAL_COMMUNITY): Payer: Self-pay | Admitting: Certified Registered"

## 2011-08-14 DIAGNOSIS — K219 Gastro-esophageal reflux disease without esophagitis: Secondary | ICD-10-CM | POA: Insufficient documentation

## 2011-08-14 DIAGNOSIS — I120 Hypertensive chronic kidney disease with stage 5 chronic kidney disease or end stage renal disease: Secondary | ICD-10-CM | POA: Insufficient documentation

## 2011-08-14 DIAGNOSIS — N186 End stage renal disease: Secondary | ICD-10-CM | POA: Insufficient documentation

## 2011-08-14 DIAGNOSIS — E119 Type 2 diabetes mellitus without complications: Secondary | ICD-10-CM | POA: Insufficient documentation

## 2011-08-14 DIAGNOSIS — N184 Chronic kidney disease, stage 4 (severe): Secondary | ICD-10-CM

## 2011-08-14 DIAGNOSIS — Z992 Dependence on renal dialysis: Secondary | ICD-10-CM | POA: Insufficient documentation

## 2011-08-14 DIAGNOSIS — J449 Chronic obstructive pulmonary disease, unspecified: Secondary | ICD-10-CM | POA: Insufficient documentation

## 2011-08-14 DIAGNOSIS — J4489 Other specified chronic obstructive pulmonary disease: Secondary | ICD-10-CM | POA: Insufficient documentation

## 2011-08-14 DIAGNOSIS — E785 Hyperlipidemia, unspecified: Secondary | ICD-10-CM | POA: Insufficient documentation

## 2011-08-14 HISTORY — DX: Acute myocardial infarction, unspecified: I21.9

## 2011-08-14 HISTORY — PX: AV FISTULA PLACEMENT: SHX1204

## 2011-08-14 HISTORY — DX: Cerebral infarction, unspecified: I63.9

## 2011-08-14 LAB — GLUCOSE, CAPILLARY: Glucose-Capillary: 132 mg/dL — ABNORMAL HIGH (ref 70–99)

## 2011-08-14 SURGERY — ARTERIOVENOUS (AV) FISTULA CREATION
Anesthesia: Monitor Anesthesia Care | Site: Arm Lower | Laterality: Right

## 2011-08-14 MED ORDER — HEPARIN SODIUM (PORCINE) 1000 UNIT/ML IJ SOLN
INTRAMUSCULAR | Status: DC | PRN
  Administered 2011-08-14: 5000 [IU] via INTRAVENOUS

## 2011-08-14 MED ORDER — FENTANYL CITRATE 0.05 MG/ML IJ SOLN: 25.0000 ug | INTRAMUSCULAR | Status: DC | PRN

## 2011-08-14 MED ORDER — SODIUM CHLORIDE 0.9 % IR SOLN
Status: DC | PRN
  Administered 2011-08-14: 08:00:00

## 2011-08-14 MED ORDER — LIDOCAINE-EPINEPHRINE (PF) 1 %-1:200000 IJ SOLN
INTRAMUSCULAR | Status: DC | PRN
  Administered 2011-08-14: 6 mL

## 2011-08-14 MED ORDER — ONDANSETRON HCL 4 MG/2ML IJ SOLN
INTRAMUSCULAR | Status: DC | PRN
  Administered 2011-08-14: 4 mg via INTRAVENOUS

## 2011-08-14 MED ORDER — PROPOFOL 10 MG/ML IV EMUL
INTRAVENOUS | Status: DC | PRN
  Administered 2011-08-14: 25 ug/kg/min via INTRAVENOUS

## 2011-08-14 MED ORDER — ONDANSETRON HCL 4 MG/2ML IJ SOLN: 4.0000 mg | Freq: Four times a day (QID) | INTRAMUSCULAR | Status: DC | PRN

## 2011-08-14 MED ORDER — 0.9 % SODIUM CHLORIDE (POUR BTL) OPTIME
TOPICAL | Status: DC | PRN
  Administered 2011-08-14: 1000 mL

## 2011-08-14 MED ORDER — MUPIROCIN 2 % EX OINT
TOPICAL_OINTMENT | Freq: Two times a day (BID) | CUTANEOUS | Status: DC
  Filled 2011-08-14: qty 22

## 2011-08-14 MED ORDER — SUFENTANIL CITRATE 50 MCG/ML IV SOLN
INTRAVENOUS | Status: DC | PRN
  Administered 2011-08-14: 10 ug via INTRAVENOUS

## 2011-08-14 MED ORDER — MUPIROCIN 2 % EX OINT
TOPICAL_OINTMENT | CUTANEOUS | Status: AC
  Filled 2011-08-14: qty 22

## 2011-08-14 MED ORDER — DROPERIDOL 2.5 MG/ML IJ SOLN
INTRAMUSCULAR | Status: DC | PRN
  Administered 2011-08-14: 0.625 mg via INTRAVENOUS

## 2011-08-14 SURGICAL SUPPLY — 39 items
ADH SKN CLS APL DERMABOND .7 (GAUZE/BANDAGES/DRESSINGS) ×1
ADH SKN CLS LQ APL DERMABOND (GAUZE/BANDAGES/DRESSINGS) ×1
CANISTER SUCTION 2500CC (MISCELLANEOUS) ×1 IMPLANT
CLIP TI MEDIUM 6 (CLIP) ×1 IMPLANT
CLIP TI WIDE RED SMALL 6 (CLIP) ×2 IMPLANT
CLOTH BEACON ORANGE TIMEOUT ST (SAFETY) ×1 IMPLANT
COVER PROBE W GEL 5X96 (DRAPES) IMPLANT
COVER SURGICAL LIGHT HANDLE (MISCELLANEOUS) ×2 IMPLANT
DECANTER SPIKE VIAL GLASS SM (MISCELLANEOUS) ×1 IMPLANT
DERMABOND ADHESIVE PROPEN (GAUZE/BANDAGES/DRESSINGS) ×1
DERMABOND ADVANCED (GAUZE/BANDAGES/DRESSINGS) ×1
DERMABOND ADVANCED .7 DNX12 (GAUZE/BANDAGES/DRESSINGS) IMPLANT
DERMABOND ADVANCED .7 DNX6 (GAUZE/BANDAGES/DRESSINGS) IMPLANT
ELECT REM PT RETURN 9FT ADLT (ELECTROSURGICAL) ×2
ELECTRODE REM PT RTRN 9FT ADLT (ELECTROSURGICAL) IMPLANT
GLOVE BIO SURGEON STRL SZ7.5 (GLOVE) ×1 IMPLANT
GLOVE BIOGEL PI IND STRL 6.5 (GLOVE) IMPLANT
GLOVE BIOGEL PI IND STRL 7.0 (GLOVE) IMPLANT
GLOVE BIOGEL PI IND STRL 7.5 (GLOVE) IMPLANT
GLOVE BIOGEL PI INDICATOR 6.5 (GLOVE) ×2
GLOVE BIOGEL PI INDICATOR 7.0 (GLOVE) ×1
GLOVE BIOGEL PI INDICATOR 7.5 (GLOVE) ×2
GLOVE SURG SS PI 6.5 STRL IVOR (GLOVE) ×1 IMPLANT
GLOVE SURG SS PI 7.5 STRL IVOR (GLOVE) ×1 IMPLANT
GOWN STRL NON-REIN LRG LVL3 (GOWN DISPOSABLE) ×2 IMPLANT
KIT BASIN OR (CUSTOM PROCEDURE TRAY) ×1 IMPLANT
KIT ROOM TURNOVER OR (KITS) ×1 IMPLANT
NS IRRIG 1000ML POUR BTL (IV SOLUTION) ×1 IMPLANT
PACK CV ACCESS (CUSTOM PROCEDURE TRAY) ×1 IMPLANT
PAD ARMBOARD 7.5X6 YLW CONV (MISCELLANEOUS) ×1 IMPLANT
SPONGE GAUZE 4X4 12PLY (GAUZE/BANDAGES/DRESSINGS) ×1 IMPLANT
SUT PROLENE 6 0 BV (SUTURE) ×1 IMPLANT
SUT VIC AB 3-0 SH 27 (SUTURE) ×2
SUT VIC AB 3-0 SH 27X BRD (SUTURE) IMPLANT
SUT VICRYL 4-0 PS2 18IN ABS (SUTURE) ×1 IMPLANT
TOWEL OR 17X24 6PK STRL BLUE (TOWEL DISPOSABLE) ×1 IMPLANT
TOWEL OR 17X26 10 PK STRL BLUE (TOWEL DISPOSABLE) ×1 IMPLANT
UNDERPAD 30X30 INCONTINENT (UNDERPADS AND DIAPERS) ×2 IMPLANT
WATER STERILE IRR 1000ML POUR (IV SOLUTION) ×1 IMPLANT

## 2011-08-14 NOTE — Progress Notes (Signed)
IV team paged to de access hd catheter.

## 2011-08-14 NOTE — Anesthesia Postprocedure Evaluation (Signed)
Anesthesia Post Note  Patient: Claudia Rich  Procedure(s) Performed:  ARTERIOVENOUS (AV) FISTULA CREATION - Right Arm Arteriovenous Fistula Creation/ contact pt's niece,[name] for any information to be relayed to pt: # [phone]  Anesthesia type: MAC  Patient location: PACU  Post pain: Pain level controlled and Adequate analgesia  Post assessment: Post-op Vital signs reviewed, Patient's Cardiovascular Status Stable and Respiratory Function Stable  Last Vitals:  Filed Vitals:   08/14/11 1023  BP: 117/48  Pulse: 70  Temp: 36.2 C  Resp: 16    Post vital signs: Reviewed and stable  Level of consciousness: awake, alert  and oriented  Complications: No apparent anesthesia complications

## 2011-08-14 NOTE — Progress Notes (Signed)
Sister called for ride home.

## 2011-08-14 NOTE — H&P (View-Only) (Signed)
[facility] [place]  Patient name: Claudia Rich MRN: [id number] DOB: 11/07/1926 Sex: female  CC: Evaluate for new access  HPI: Claudia Rich is a 77 y.o. female who had a left brachiocephalic fistula placed on 09/24/2010. He had a fistulogram in June of 2012 which showed an 80% stenosis in the proximal fistula and a 40% stenosis in the distal third of the cephalic vein. Because of the very small size of the vein the patient was not a candidate for any type of intervention. Patient later had a patch angioplasty of the stenosis but ultimately the fistula failed. She was sent for evaluation for new access.  The patient dialyzes on Tuesdays Thursdays and Saturdays. She's had no recent uremic symptoms. Specifically she denies nausea, vomiting, palpitations, fatigue, or anorexia.  Past Medical History  Diagnosis Date  . Chronic kidney disease   . Hypertension   . Diabetes mellitus   . Hyperlipidemia   . GERD (gastroesophageal reflux disease)   . Anemia   . COPD (chronic obstructive pulmonary disease)   . Peripheral vascular disease   . Glaucoma   . Hammer toe   . Internal hemorrhoids   . Diverticulosis   . Colon polyps     History reviewed. No pertinent family history.  SOCIAL HISTORY: History  Substance Use Topics  . Smoking status: Never Smoker   . Smokeless tobacco: Never Used  . Alcohol Use: No    Allergies  Allergen Reactions  . Aspirin     Upset stomach  . Codeine Sulfate   . Meperidine Hcl     REACTION: vomiting    Current Outpatient Prescriptions  Medication Sig Dispense Refill  . amLODipine (NORVASC) 10 MG tablet Take 1 tablet (10 mg total) by mouth daily.  30 tablet  11  . aspirin (BAYER CHILDRENS ASPIRIN) 81 MG chewable tablet Chew 1 tablet (81 mg total) by mouth daily.  30 tablet  11  . atorvastatin (LIPITOR) 80 MG tablet Take 1 tablet (80 mg total) by mouth at bedtime.  30 tablet  11  . B Complex-C-Folic Acid (RENAL MULTIVITAMIN FORMULA  PO) Take 10 mg by mouth daily.        . calcium acetate (PHOSLO) 667 MG capsule Take 667 mg by mouth 2 (two) times daily.        . glipiZIDE (GLUCOTROL) 10 MG tablet Take 1 tablet (10 mg total) by mouth 2 (two) times daily before a meal. Take 2 tablets two times/day  60 tablet  11  . metoprolol tartrate (LOPRESSOR) 25 MG tablet Take 1 tablet (25 mg total) by mouth 2 (two) times daily. Take one tablet twice/day on nondialysis days, and take one tablet nightly on Tues., Thurs., Sat.  For blood pressure  60 tablet  11  . ranitidine (ZANTAC) 150 MG capsule Take 150 mg by mouth 2 (two) times daily. For stomach ulcer       . sitaGLIPtin (JANUVIA) 25 MG tablet Take 1 tablet (25 mg total) by mouth daily.  90 tablet  11  . VENTOLIN HFA 108 (90 BASE) MCG/ACT inhaler Inhale 2 puffs into the lungs every 6 (six) hours as needed.       . glucose blood (ACCU-CHEK COMFORT CURVE) test strip 1 each by Other route as needed. Check blood sugar 3x daily. Disp 102 strips.       . ondansetron (ZOFRAN) 4 MG tablet Take 4 mg by mouth every 8 (eight) hours as needed.        .   PEG 3350-KCl-NaBcb-NaCl-NaSulf (PEG 3350/ELECTROLYTES) 240 G SOLR         REVIEW OF SYSTEMS: [X ] denotes positive finding; [  ] denotes negative finding CARDIOVASCULAR:  [ ] chest pain   [ ] chest pressure   [ ] palpitations   [ ] orthopnea   [ ] dyspnea on exertion   [ ] claudication   [ ] rest pain   [ ] DVT   [ ] phlebitis PULMONARY:   [X ] productive cough   [X ] asthma   [ ] wheezing NEUROLOGIC:   [ ] weakness  [ ] paresthesias  [ ] aphasia  [ ] amaurosis  [ ] dizziness HEMATOLOGIC:   [ ] bleeding problems   [ ] clotting disorders MUSCULOSKELETAL:  [ ] joint pain   [ ] joint swelling [ ] leg swelling GASTROINTESTINAL: [ ]  blood in stool  [ ]  hematemesis GENITOURINARY:  [ ]  dysuria  [ ]  hematuria PSYCHIATRIC:  [ ] history of major depression INTEGUMENTARY:  [ ] rashes  [ ] ulcers CONSTITUTIONAL:  [ ] fever   [ ] chills  PHYSICAL  EXAM: Filed Vitals:   07/29/11 1607  BP: 146/77  Pulse: 73  Temp: 98.2 F (36.8 C)  TempSrc: Oral  Height: 5' 4" (1.626 m)  Weight: 140 lb (63.504 kg)  SpO2: 97%   Body mass index is 24.03 kg/(m^2). GENERAL: The patient is a well-nourished female, in no acute distress. The vital signs are documented above. CARDIOVASCULAR: There is a regular rate and rhythm without significant murmur appreciated.  PULMONARY: There is good air exchange bilaterally without wheezing or rales. ABDOMEN: Soft and non-tender with normal pitched bowel sounds.  MUSCULOSKELETAL: There are no major deformities or cyanosis. NEUROLOGIC: No focal weakness or paresthesias are detected. SKIN: There are no ulcers or rashes noted. PSYCHIATRIC: The patient has a normal affect. EXT: the right upper arm AV fistula is thrombosed.  DATA:  I have independently interpreted her vein mapping today which shows that the forearm cephalic vein on the right is fairly small. The upper arm cephalic vein is small but potentially could be used for a fistula. If this is not adequate she could potentially have a basilic vein transposition on the right.  MEDICAL ISSUES:  I have explained the indications for placement of an AV fistula or AV graft. I've explained that if at all possible we will place an AV fistula.  I have reviewed the risks of placement of an AV fistula including but not limited to: failure of the fistula to mature, need for subsequent interventions, and thrombosis. In addition I have reviewed the potential complications of placement of an AV graft. These risks include, but are not limited to, graft thrombosis, graft infection, wound healing problems, bleeding, arm swelling, and steal syndrome. All the patient's questions were answered and they are agreeable to proceed with surgery.  She is scheduled for a right AV fistula on 08/14/11.   Elbony Mcclimans S [facility] [place] Office:  [phone]   

## 2011-08-14 NOTE — Transfer of Care (Signed)
Immediate Anesthesia Transfer of Care Note  Patient: Claudia Rich  Procedure(s) Performed:  ARTERIOVENOUS (AV) FISTULA CREATION - Right Arm Arteriovenous Fistula Creation/ contact pt's niece,[name] for any information to be relayed to pt: # [phone]  Patient Location: PACU  Anesthesia Type: MAC  Level of Consciousness: awake, alert , oriented and patient cooperative  Airway & Oxygen Therapy: Patient Spontanous Breathing  Post-op Assessment: Report given to PACU RN, Post -op Vital signs reviewed and stable and Patient moving all extremities  Post vital signs: Reviewed and stable  Complications: No apparent anesthesia complications

## 2011-08-14 NOTE — Progress Notes (Signed)
Mupirocin oint to both nostrils per protocal given in short stay at [phone]

## 2011-08-14 NOTE — Progress Notes (Signed)
Called to Short Stay to stop iv fluids infusing into hemo cath; blue port flushed w. Ns, followed by 1.7cc of heparin, 1000 units/ml; blue port capped, clamped, and taped for safety;

## 2011-08-14 NOTE — Progress Notes (Signed)
Claudia Rich, renal PA aware of pt's procedure today,  And normal K level.  States pt is to return to usual HD center at normal time.

## 2011-08-14 NOTE — Interval H&P Note (Signed)
History and Physical Interval Note:  08/14/2011 7:08 AM  Claudia Rich  has presented today for surgery, with the diagnosis of End Stage Renal Disease  The various methods of treatment have been discussed with the patient and family. After consideration of risks, benefits and other options for treatment, the patient has consented to: ARTERIOVENOUS (AV) FISTULA CREATION.  The patients' history has been reviewed, patient examined, no change in status, stable for surgery.  I have reviewed the patients' chart and labs.  Questions were answered to the patient's satisfaction.     Corinne Goucher S

## 2011-08-14 NOTE — Op Note (Signed)
NAME: [name]    MRN: [id number] DOB: [birth year]    DATE OF OPERATION: 08/14/2011  PREOP DIAGNOSIS: Chronic kidney disease  POSTOP DIAGNOSIS: Stain  PROCEDURE: Right brachiocephalic AV fistula  SURGEON: Di Kindle. Edilia Bo, MD, FACS  ASSIST: Emilio Aspen RNFA  ANESTHESIA: local with sedation   EBL: minimal  INDICATIONS: Claudia Rich is a 77 y.o. female who presents for a new right upper extremity AV fistula.  FINDINGS: 4 mm vein.  TECHNIQUE: The patient was taken to the operating room and sedated by anesthesia. The right upper extremity was prepped and draped in the usual sterile fashion. After the skin was anesthetized with 1% lidocaine, a transverse incision was made just above the antecubital level. The cephalic vein was dissected free and ligated distally. It irrigated up nicely with heparinized saline. It was an approximately 4 mm vein. The brachial artery was dissected free beneath the fascia. It had an excellent pulse and was soft and free of atherosclerotic disease. The patient was heparinized. The brachial artery was clamped proximally and distally and a longitudinal arteriotomy was made. The vein was sewn end to side to the artery using continuous 6-0 Prolene suture. At the completion was an excellent thrill in the fistula and a palpable radial pulse. The heparin was partially reversed with protamine. The wound was closed with a deep layer of 3-0 Vicryl. The skin was closed with 4-0 Vicryl. Dermabond was applied. All needle and sponge counts were correct. The patient was transferred to the recovery room in stable condition.  [name], MD, FACS [facility]  DATE OF DICTATION:   08/14/2011

## 2011-08-14 NOTE — Anesthesia Preprocedure Evaluation (Addendum)
Anesthesia Evaluation  Patient identified by MRN, date of birth, ID band Patient awake    Reviewed: Allergy & Precautions, H&P , NPO status , Patient's Chart, lab work & pertinent test results  Airway Mallampati: II  Neck ROM: full    Dental   Pulmonary shortness of breath and with exertion, COPD COPD inhaler,          Cardiovascular Exercise Tolerance: Poor hypertension, Pt. on medications + CAD and + Past MI     Neuro/Psych CVA Negative Neurological ROS  Negative Psych ROS   GI/Hepatic Neg liver ROS, GERD-  Controlled,  Endo/Other  Diabetes mellitus-, Type 2, Oral Hypoglycemic Agents  Renal/GU ESRF and DialysisRenal disease  Genitourinary negative   Musculoskeletal negative musculoskeletal ROS (+)   Abdominal   Peds  Hematology negative hematology ROS (+)   Anesthesia Other Findings   Reproductive/Obstetrics negative OB ROS                          Anesthesia Physical Anesthesia Plan  ASA: III  Anesthesia Plan: MAC   Post-op Pain Management:    Induction: Intravenous  Airway Management Planned: Simple Face Mask  Additional Equipment:   Intra-op Plan:   Post-operative Plan:   Informed Consent: I have reviewed the patients History and Physical, chart, labs and discussed the procedure including the risks, benefits and alternatives for the proposed anesthesia with the patient or authorized representative who has indicated his/her understanding and acceptance.     Plan Discussed with: CRNA and Surgeon  Anesthesia Plan Comments:         Anesthesia Quick Evaluation

## 2011-08-14 NOTE — OR Nursing (Signed)
Two Fistula creations noted under LDA's and Multiple Site Dressings - only one fistula created, Right arm/Antecubital space.

## 2011-08-17 LAB — POCT I-STAT 4, (NA,K, GLUC, HGB,HCT)
HCT: 42 % (ref 36.0–46.0)
Hemoglobin: 14.3 g/dL (ref 12.0–15.0)

## 2011-08-17 MED FILL — Mupirocin Oint 2%: CUTANEOUS | Qty: 22 | Status: AC

## 2011-08-20 ENCOUNTER — Encounter (HOSPITAL_COMMUNITY): Payer: Self-pay | Admitting: Vascular Surgery

## 2011-09-22 ENCOUNTER — Encounter: Payer: Self-pay | Admitting: Vascular Surgery

## 2011-09-23 ENCOUNTER — Encounter (INDEPENDENT_AMBULATORY_CARE_PROVIDER_SITE_OTHER): Payer: Medicare FFS | Admitting: *Deleted

## 2011-09-23 ENCOUNTER — Encounter: Payer: Self-pay | Admitting: *Deleted

## 2011-09-23 ENCOUNTER — Encounter: Payer: Self-pay | Admitting: Vascular Surgery

## 2011-09-23 ENCOUNTER — Ambulatory Visit (INDEPENDENT_AMBULATORY_CARE_PROVIDER_SITE_OTHER): Payer: Medicare FFS | Admitting: Vascular Surgery

## 2011-09-23 VITALS — BP 145/67 | HR 76 | Temp 98.3°F | Resp 16 | Ht 61.0 in | Wt 147.0 lb

## 2011-09-23 DIAGNOSIS — N186 End stage renal disease: Secondary | ICD-10-CM

## 2011-09-23 NOTE — Progress Notes (Signed)
[facility] [place]  Patient name: Claudia Rich MRN: [id number] DOB: 06/02/1927 Sex: female  REASON FOR VISIT: Follow up of right brachiocephalic AV fistula.  HPI: Claudia Rich is a 78 y.o. female with chronic kidney disease who dialyzes on Tuesdays Thursdays and Saturdays. She has a functioning right IJ diatek catheter. She had a right brachiocephalic AV fistula placed on 08/14/2011. She's had significant right arm swelling since that time. She comes in for a routine follow up visit. She has had no fever or chills.  Past Medical History  Diagnosis Date  . Hypertension   . Diabetes mellitus   . Hyperlipidemia   . GERD (gastroesophageal reflux disease)   . Glaucoma   . Hammer toe   . Internal hemorrhoids   . Diverticulosis   . Colon polyps   . Chronic kidney disease     Tues, Thurs, Sat  . Peripheral vascular disease   . Anemia   . COPD (chronic obstructive pulmonary disease)   . Stroke 10/2010  . Myocardial infarction 10/2010    Family History  Problem Relation Age of Onset  . Anesthesia problems Neg Hx     SOCIAL HISTORY: History  Substance Use Topics  . Smoking status: Never Smoker   . Smokeless tobacco: Never Used  . Alcohol Use: No    Allergies  Allergen Reactions  . Aspirin     Upset stomach  . Codeine Sulfate Other (See Comments)    unknown  . Meperidine Hcl     REACTION: vomiting    Current Outpatient Prescriptions  Medication Sig Dispense Refill  . amLODipine (NORVASC) 10 MG tablet Take 1 tablet (10 mg total) by mouth daily.  30 tablet  11  . aspirin (BAYER CHILDRENS ASPIRIN) 81 MG chewable tablet Chew 1 tablet (81 mg total) by mouth daily.  30 tablet  11  . atorvastatin (LIPITOR) 80 MG tablet Take 1 tablet (80 mg total) by mouth at bedtime.  30 tablet  11  . B Complex-C-Folic Acid (RENAL MULTIVITAMIN FORMULA PO) Take 10 mg by mouth daily.       . calcium acetate (PHOSLO) 667 MG capsule Take 667 mg by mouth 2 (two) times daily.         . glipiZIDE (GLUCOTROL) 10 MG tablet Take 1 tablet (10 mg total) by mouth 2 (two) times daily before a meal. Take 2 tablets two times/day  60 tablet  11  . glucose blood (ACCU-CHEK COMFORT CURVE) test strip 1 each by Other route as needed. Check blood sugar 3x daily. Disp 102 strips.       . metoprolol tartrate (LOPRESSOR) 25 MG tablet Take 1 tablet (25 mg total) by mouth 2 (two) times daily. Take one tablet twice/day on nondialysis days, and take one tablet nightly on Tues., Thurs., Sat.  For blood pressure  60 tablet  11  . ondansetron (ZOFRAN) 4 MG tablet Take 4 mg by mouth every 8 (eight) hours as needed. For nausea.      . ranitidine (ZANTAC) 150 MG tablet TAKE ONE TABLET TWICE DAILY FOR         STOMACH ULCER  60 tablet  11  . sitaGLIPtin (JANUVIA) 25 MG tablet Take 1 tablet (25 mg total) by mouth daily.  90 tablet  11  . VENTOLIN HFA 108 (90 BASE) MCG/ACT inhaler Inhale 2 puffs into the lungs every 6 (six) hours as needed. For shortness of breath.        REVIEW OF   SYSTEMS: [X ] denotes positive finding; [  ] denotes negative finding  CARDIOVASCULAR:  [ ] chest pain   [ ] chest pressure   [ ] palpitations   [ ] orthopnea   [ ] dyspnea on exertion   [ ] claudication   [ ] rest pain   [ ] DVT   [ ] phlebitis PULMONARY:   [ ] productive cough   [ ] asthma   [ ] wheezing CONSTITUTIONAL:  [ ] fever   [ ] chills  PHYSICAL EXAM: Filed Vitals:   09/23/11 1316  BP: 145/67  Pulse: 76  Temp: 98.3 F (36.8 C)  TempSrc: Oral  Resp: 16  Height: 5' 1" (1.549 m)  Weight: 147 lb (66.679 kg)  SpO2: 93%   Body mass index is 27.78 kg/(m^2). GENERAL: The patient is a well-nourished female, in no acute distress. The vital signs are documented above. CARDIOVASCULAR: There is a regular rate and rhythm without significant murmur appreciated.  PULMONARY: There is good air exchange bilaterally without wheezing or rales. She has significant swelling of her right arm up to the mid upper arm. Her fistula  has an audible bruit.  DATA: Duplex of fistula shows patent AVF. Diameters range from 4.5 to 6.6 mm. There are 4 small branches noted.   MEDICAL ISSUES:  End stage renal disease This patient had a right brachiocephalic fistula placed on 08/14/2011. She's had significant right upper extremity swelling. She likely has a central venous stenosis. Is also  Possible that her right IJ tunneled dialysis catheter is potentially obstructing flow. Duplex scan in our office today shows a patent fistula. I have scheduled a fistulogram of her right brachiocephalic AV fistula for 09/28/2011. If we find outflow stenosis amenable to angioplasty we would proceed at the same time. I have discussed the procedure potential complications with the patient. She is agreeable to proceed. We will make further recommendations pending these results.    Rmoni Keplinger S [facility] [place] Beeper: [phone]    

## 2011-09-23 NOTE — Assessment & Plan Note (Signed)
This patient had a right brachiocephalic fistula placed on 08/14/2011. She's had significant right upper extremity swelling. She likely has a central venous stenosis. Is also  Possible that her right IJ tunneled dialysis catheter is potentially obstructing flow. Duplex scan in our office today shows a patent fistula. I have scheduled a fistulogram of her right brachiocephalic AV fistula for 09/28/2011. If we find outflow stenosis amenable to angioplasty we would proceed at the same time. I have discussed the procedure potential complications with the patient. She is agreeable to proceed. We will make further recommendations pending these results.

## 2011-09-24 ENCOUNTER — Other Ambulatory Visit: Payer: Self-pay | Admitting: *Deleted

## 2011-09-25 ENCOUNTER — Encounter (HOSPITAL_COMMUNITY): Payer: Self-pay | Admitting: Respiratory Therapy

## 2011-09-27 MED ORDER — SODIUM CHLORIDE 0.9 % IJ SOLN: 3.0000 mL | INTRAMUSCULAR | Status: DC | PRN

## 2011-09-28 ENCOUNTER — Ambulatory Visit (HOSPITAL_COMMUNITY): Payer: Medicare FFS | Admitting: Anesthesiology

## 2011-09-28 ENCOUNTER — Ambulatory Visit (HOSPITAL_COMMUNITY): Payer: Medicare FFS

## 2011-09-28 ENCOUNTER — Other Ambulatory Visit: Payer: Self-pay

## 2011-09-28 ENCOUNTER — Encounter (HOSPITAL_COMMUNITY)
Admission: RE | Disposition: A | Discharge status: Home / Self Care | Payer: Self-pay | Source: Ambulatory Visit | Attending: Vascular Surgery

## 2011-09-28 ENCOUNTER — Ambulatory Visit (HOSPITAL_COMMUNITY)
Admission: RE | Admit: 2011-09-28 | Discharge: 2011-09-28 | Disposition: A | Discharge status: Home / Self Care | Payer: Medicare FFS | Source: Ambulatory Visit | Attending: Vascular Surgery | Admitting: Vascular Surgery

## 2011-09-28 ENCOUNTER — Encounter (HOSPITAL_COMMUNITY): Payer: Self-pay | Admitting: Anesthesiology

## 2011-09-28 DIAGNOSIS — T82898A Other specified complication of vascular prosthetic devices, implants and grafts, initial encounter: Secondary | ICD-10-CM | POA: Insufficient documentation

## 2011-09-28 DIAGNOSIS — M7989 Other specified soft tissue disorders: Secondary | ICD-10-CM | POA: Insufficient documentation

## 2011-09-28 DIAGNOSIS — Y832 Surgical operation with anastomosis, bypass or graft as the cause of abnormal reaction of the patient, or of later complication, without mention of misadventure at the time of the procedure: Secondary | ICD-10-CM

## 2011-09-28 DIAGNOSIS — N186 End stage renal disease: Secondary | ICD-10-CM | POA: Insufficient documentation

## 2011-09-28 HISTORY — PX: INSERTION OF DIALYSIS CATHETER: SHX1324

## 2011-09-28 HISTORY — PX: SHUNTOGRAM: SHX5491

## 2011-09-28 LAB — GLUCOSE, CAPILLARY

## 2011-09-28 SURGERY — ASSESSMENT, SHUNT FUNCTION, WITH CONTRAST RADIOGRAPHIC STUDY
Anesthesia: LOCAL | Laterality: Right

## 2011-09-28 SURGERY — REMOVAL, DIALYSIS CATHETER
Anesthesia: General | Site: Neck | Laterality: Right | Wound class: Clean Contaminated

## 2011-09-28 MED ORDER — ONDANSETRON HCL 4 MG/2ML IJ SOLN
4.0000 mg | Freq: Four times a day (QID) | INTRAMUSCULAR | Status: DC | PRN
Start: 2011-09-28 — End: 2011-09-28

## 2011-09-28 MED ORDER — OXYCODONE-ACETAMINOPHEN 5-325 MG PO TABS
ORAL_TABLET | ORAL | Status: AC
  Administered 2011-09-28: 1 via ORAL
  Filled 2011-09-28: qty 1

## 2011-09-28 MED ORDER — HEPARIN (PORCINE) IN NACL 2-0.9 UNIT/ML-% IJ SOLN
INTRAMUSCULAR | Status: AC
  Filled 2011-09-28: qty 1000

## 2011-09-28 MED ORDER — SODIUM CHLORIDE 0.9 % IV SOLN
INTRAVENOUS | Status: DC
  Administered 2011-09-28: 08:00:00 via INTRAVENOUS

## 2011-09-28 MED ORDER — PROPOFOL 10 MG/ML IV EMUL
INTRAVENOUS | Status: DC | PRN
  Administered 2011-09-28: 20 mg via INTRAVENOUS
  Administered 2011-09-28: 30 mg via INTRAVENOUS
  Administered 2011-09-28: 20 mg via INTRAVENOUS
  Administered 2011-09-28: 30 mg via INTRAVENOUS

## 2011-09-28 MED ORDER — OXYCODONE-ACETAMINOPHEN 5-325 MG PO TABS
1.0000 | ORAL_TABLET | Freq: Once | ORAL | Status: AC
  Administered 2011-09-28: 1 via ORAL

## 2011-09-28 MED ORDER — CEFAZOLIN SODIUM 1-5 GM-% IV SOLN
INTRAVENOUS | Status: DC | PRN
  Administered 2011-09-28: 1 g via INTRAVENOUS

## 2011-09-28 MED ORDER — SODIUM CHLORIDE 0.9 % IR SOLN
Status: DC | PRN
  Administered 2011-09-28: 11:00:00

## 2011-09-28 MED ORDER — TRAMADOL HCL 50 MG PO TABS: 50.0000 mg | ORAL_TABLET | Freq: Four times a day (QID) | ORAL | Status: DC | PRN

## 2011-09-28 MED ORDER — FENTANYL CITRATE 0.05 MG/ML IJ SOLN
INTRAMUSCULAR | Status: DC | PRN
  Administered 2011-09-28: 50 ug via INTRAVENOUS

## 2011-09-28 MED ORDER — SODIUM CHLORIDE 0.9 % IV SOLN
INTRAVENOUS | Status: DC | PRN
  Administered 2011-09-28: 10:00:00 via INTRAVENOUS

## 2011-09-28 MED ORDER — MUPIROCIN 2 % EX OINT
TOPICAL_OINTMENT | CUTANEOUS | Status: AC
  Filled 2011-09-28: qty 22

## 2011-09-28 MED ORDER — FENTANYL CITRATE 0.05 MG/ML IJ SOLN: 25.0000 ug | INTRAMUSCULAR | Status: DC | PRN

## 2011-09-28 MED ORDER — LIDOCAINE HCL (PF) 1 % IJ SOLN
INTRAMUSCULAR | Status: DC | PRN
  Administered 2011-09-28: 25 mL

## 2011-09-28 MED ORDER — HEPARIN SODIUM (PORCINE) 1000 UNIT/ML IJ SOLN
INTRAMUSCULAR | Status: DC | PRN
  Administered 2011-09-28: 4.6 mL

## 2011-09-28 MED ORDER — CEFAZOLIN SODIUM 1-5 GM-% IV SOLN
INTRAVENOUS | Status: AC
  Filled 2011-09-28: qty 50

## 2011-09-28 MED ORDER — LIDOCAINE HCL (PF) 1 % IJ SOLN
INTRAMUSCULAR | Status: AC
  Filled 2011-09-28: qty 30

## 2011-09-28 MED ORDER — 0.9 % SODIUM CHLORIDE (POUR BTL) OPTIME
TOPICAL | Status: DC | PRN
  Administered 2011-09-28: 1000 mL

## 2011-09-28 SURGICAL SUPPLY — 40 items
BAG DECANTER FOR FLEXI CONT (MISCELLANEOUS) ×3 IMPLANT
CATH CANNON HEMO 15F 50CM (CATHETERS) IMPLANT
CATH CANNON HEMO 15FR 19 (HEMODIALYSIS SUPPLIES) IMPLANT
CATH CANNON HEMO 15FR 23CM (HEMODIALYSIS SUPPLIES) ×1 IMPLANT
CATH CANNON HEMO 15FR 31CM (HEMODIALYSIS SUPPLIES) IMPLANT
CATH CANNON HEMO 15FR 32 (HEMODIALYSIS SUPPLIES) IMPLANT
CATH CANNON HEMO 15FR 32CM (HEMODIALYSIS SUPPLIES) IMPLANT
CHLORAPREP W/TINT 26ML (MISCELLANEOUS) ×3 IMPLANT
CLOTH BEACON ORANGE TIMEOUT ST (SAFETY) ×3 IMPLANT
COVER PROBE W GEL 5X96 (DRAPES) ×1 IMPLANT
COVER SURGICAL LIGHT HANDLE (MISCELLANEOUS) ×3 IMPLANT
DECANTER SPIKE VIAL GLASS SM (MISCELLANEOUS) ×3 IMPLANT
DRAPE C-ARM 42X72 X-RAY (DRAPES) ×3 IMPLANT
DRAPE CHEST BREAST 15X10 FENES (DRAPES) ×3 IMPLANT
GAUZE SPONGE 2X2 8PLY STRL LF (GAUZE/BANDAGES/DRESSINGS) ×2 IMPLANT
GAUZE SPONGE 4X4 16PLY XRAY LF (GAUZE/BANDAGES/DRESSINGS) ×3 IMPLANT
GLOVE BIO SURGEON STRL SZ7.5 (GLOVE) ×3 IMPLANT
GOWN PREVENTION PLUS XLARGE (GOWN DISPOSABLE) ×3 IMPLANT
GOWN STRL NON-REIN LRG LVL3 (GOWN DISPOSABLE) ×6 IMPLANT
KIT BASIN OR (CUSTOM PROCEDURE TRAY) ×3 IMPLANT
KIT ROOM TURNOVER OR (KITS) ×3 IMPLANT
NDL 18GX1X1/2 (RX/OR ONLY) (NEEDLE) ×2 IMPLANT
NDL HYPO 25GX1X1/2 BEV (NEEDLE) ×2 IMPLANT
NEEDLE 18GX1X1/2 (RX/OR ONLY) (NEEDLE) ×3 IMPLANT
NEEDLE HYPO 25GX1X1/2 BEV (NEEDLE) ×3 IMPLANT
NS IRRIG 1000ML POUR BTL (IV SOLUTION) ×3 IMPLANT
PACK SURGICAL SETUP 50X90 (CUSTOM PROCEDURE TRAY) ×3 IMPLANT
PAD ARMBOARD 7.5X6 YLW CONV (MISCELLANEOUS) ×6 IMPLANT
SPONGE GAUZE 2X2 STER 10/PKG (GAUZE/BANDAGES/DRESSINGS) ×1
SUT ETHILON 3 0 PS 1 (SUTURE) ×3 IMPLANT
SUT VICRYL 4-0 PS2 18IN ABS (SUTURE) ×3 IMPLANT
SYR 20CC LL (SYRINGE) ×6 IMPLANT
SYR 30ML LL (SYRINGE) IMPLANT
SYR 5ML LL (SYRINGE) ×6 IMPLANT
SYR CONTROL 10ML LL (SYRINGE) ×3 IMPLANT
SYRINGE 10CC LL (SYRINGE) ×3 IMPLANT
TAPE CLOTH SURG 4X10 WHT LF (GAUZE/BANDAGES/DRESSINGS) ×1 IMPLANT
TOWEL OR 17X24 6PK STRL BLUE (TOWEL DISPOSABLE) ×3 IMPLANT
TOWEL OR 17X26 10 PK STRL BLUE (TOWEL DISPOSABLE) ×3 IMPLANT
WATER STERILE IRR 1000ML POUR (IV SOLUTION) ×3 IMPLANT

## 2011-09-28 NOTE — Anesthesia Postprocedure Evaluation (Signed)
Anesthesia Post Note  Patient: Claudia Rich  Procedure(s) Performed:  REMOVAL OF A DIALYSIS CATHETER - removal of diatek catheter; INSERTION OF DIALYSIS CATHETER - Insertion of Dialysis Catheter left IJ  Anesthesia type: MAC  Patient location: PACU  Post pain: Pain level controlled and Adequate analgesia  Post assessment: Post-op Vital signs reviewed, Patient's Cardiovascular Status Stable and Respiratory Function Stable  Last Vitals:  Filed Vitals:   09/28/11 1145  BP: 139/54  Pulse: 79  Temp:   Resp: 14    Post vital signs: Reviewed and stable  Level of consciousness: awake, alert  and oriented  Complications: No apparent anesthesia complications

## 2011-09-28 NOTE — Op Note (Signed)
Procedure: Ultrasound-guided insertion of Diatek catheter left side         Removal right side dialysis catheter  Preoperative diagnosis: 1.  End-stage renal disease 2.  Right arm swelling  Postoperative diagnosis: Same  Anesthesia: Local with IV sedation  Operative findings: 23 cm Diatek catheter left internal jugular vein  Operative details: After obtaining informed consent, the patient was taken to the operating room. The patient was placed in supine position on the operating room table. After adequate sedation the patient's entire neck and chest were prepped and draped in usual sterile fashion. The patient was placed in Trendelenburg position. Ultrasound was used to identify the patient's left internal jugular vein. This had normal compressibility and respiratory variation. Local anesthesia was infiltrated over the left jugular vein.  Using ultrasound guidance, the left internal jugular vein was successfully cannulated.  A 0.035 J-tipped guidewire was threaded into the left internal jugular vein and into the superior vena cava followed by the inferior vena cava under fluoroscopic guidance.   Next sequential 12 and 14 dilators were placed over the guidewire into the right atrium.  A 16 French dilator with a peel-away sheath was then placed over the guidewire into the right atrium.   The guidewire and dilator were removed. A 23 cm Diatek catheter was then placed through the peel away sheath into the right atrium.  The catheter was then tunneled subcutaneously, cut to length, and the hub attached. The catheter was noted to flush and draw easily. The catheter was inspected under fluoroscopy and found with its tip to be in the right atrium without any kinks throughout its course. The catheter was sutured to the skin with nylon sutures. The neck insertion site was closed with Vicryl stitch. The catheter was then loaded with concentrated Heparin solution. A dry sterile dressing was applied.  Attention was  then turned to the existing right side dialysis catheter.  Local anesthesia was infiltrated along the course of the catheter.  Blunt dissection was performed in the preexisting tract to free up the cuff.  The right side catheter was then removed with gentle traction.  Fluoro was then used to confirm the left side catheter was still in good position.  A 3 0 Nylon stitch was used to close the skin exit site.  The patient tolerated procedure well and there were no complications. Instrument sponge and needle counts correct in the case. The patient was taken to the recovery room in stable condition. Chest x-ray will be obtained in the recovery room.  Fabienne Bruns, MD [facility] [place] Office: [phone] Pager: [phone]

## 2011-09-28 NOTE — Anesthesia Preprocedure Evaluation (Signed)
Anesthesia Evaluation  Patient identified by MRN, date of birth, ID band Patient awake    Reviewed: Allergy & Precautions, H&P , NPO status , Patient's Chart, lab work & pertinent test results  Airway Mallampati: II  Neck ROM: full    Dental   Pulmonary COPD         Cardiovascular hypertension, + Past MI     Neuro/Psych CVA    GI/Hepatic GERD-  ,  Endo/Other  Diabetes mellitus-  Renal/GU      Musculoskeletal   Abdominal   Peds  Hematology   Anesthesia Other Findings   Reproductive/Obstetrics                           Anesthesia Physical Anesthesia Plan  ASA: III  Anesthesia Plan: General   Post-op Pain Management:    Induction: Intravenous  Airway Management Planned: LMA  Additional Equipment:   Intra-op Plan:   Post-operative Plan:   Informed Consent: I have reviewed the patients History and Physical, chart, labs and discussed the procedure including the risks, benefits and alternatives for the proposed anesthesia with the patient or authorized representative who has indicated his/her understanding and acceptance.     Plan Discussed with: CRNA and Surgeon  Anesthesia Plan Comments:         Anesthesia Quick Evaluation

## 2011-09-28 NOTE — Transfer of Care (Signed)
Immediate Anesthesia Transfer of Care Note  Patient: Claudia Rich  Procedure(s) Performed:  REMOVAL OF A DIALYSIS CATHETER - removal of diatek catheter; INSERTION OF DIALYSIS CATHETER - Insertion of Dialysis Catheter left IJ  Patient Location: PACU  Anesthesia Type: MAC  Level of Consciousness: awake and alert   Airway & Oxygen Therapy: Patient Spontanous Breathing  Post-op Assessment: Report given to PACU RN, Post -op Vital signs reviewed and unstable, Anesthesiologist notified and Patient moving all extremities  Post vital signs: Reviewed and stable  Complications: No apparent anesthesia complications

## 2011-09-28 NOTE — H&P (View-Only) (Signed)
Vascular and Vein Specialist of [facility]  Patient name: Claudia Rich MRN: [id number] DOB: Sep 09, 1926 Sex: female  REASON FOR VISIT: Follow up of right brachiocephalic AV fistula.  HPI: Claudia Rich is a 78 y.o. female with chronic kidney disease who dialyzes on Tuesdays Thursdays and Saturdays. She has a functioning right IJ diatek catheter. She had a right brachiocephalic AV fistula placed on 62/95/2841. She's had significant right arm swelling since that time. She comes in for a routine follow up visit. She has had no fever or chills.  Past Medical History  Diagnosis Date  . Hypertension   . Diabetes mellitus   . Hyperlipidemia   . GERD (gastroesophageal reflux disease)   . Glaucoma   . Hammer toe   . Internal hemorrhoids   . Diverticulosis   . Colon polyps   . Chronic kidney disease     Tues, Thurs, Sat  . Peripheral vascular disease   . Anemia   . COPD (chronic obstructive pulmonary disease)   . Stroke 10/2010  . Myocardial infarction 10/2010    Family History  Problem Relation Age of Onset  . Anesthesia problems Neg Hx     SOCIAL HISTORY: History  Substance Use Topics  . Smoking status: Never Smoker   . Smokeless tobacco: Never Used  . Alcohol Use: No    Allergies  Allergen Reactions  . Aspirin     Upset stomach  . Codeine Sulfate Other (See Comments)    unknown  . Meperidine Hcl     REACTION: vomiting    Current Outpatient Prescriptions  Medication Sig Dispense Refill  . amLODipine (NORVASC) 10 MG tablet Take 1 tablet (10 mg total) by mouth daily.  30 tablet  11  . aspirin (BAYER CHILDRENS ASPIRIN) 81 MG chewable tablet Chew 1 tablet (81 mg total) by mouth daily.  30 tablet  11  . atorvastatin (LIPITOR) 80 MG tablet Take 1 tablet (80 mg total) by mouth at bedtime.  30 tablet  11  . B Complex-C-Folic Acid (RENAL MULTIVITAMIN FORMULA PO) Take 10 mg by mouth daily.       . calcium acetate (PHOSLO) 667 MG capsule Take 667 mg by mouth 2 (two) times daily.         Marland Kitchen glipiZIDE (GLUCOTROL) 10 MG tablet Take 1 tablet (10 mg total) by mouth 2 (two) times daily before a meal. Take 2 tablets two times/day  60 tablet  11  . glucose blood (ACCU-CHEK COMFORT CURVE) test strip 1 each by Other route as needed. Check blood sugar 3x daily. Disp 102 strips.       . metoprolol tartrate (LOPRESSOR) 25 MG tablet Take 1 tablet (25 mg total) by mouth 2 (two) times daily. Take one tablet twice/day on nondialysis days, and take one tablet nightly on Tues., Thurs., Sat.  For blood pressure  60 tablet  11  . ondansetron (ZOFRAN) 4 MG tablet Take 4 mg by mouth every 8 (eight) hours as needed. For nausea.      . ranitidine (ZANTAC) 150 MG tablet TAKE ONE TABLET TWICE DAILY FOR         STOMACH ULCER  60 tablet  11  . sitaGLIPtin (JANUVIA) 25 MG tablet Take 1 tablet (25 mg total) by mouth daily.  90 tablet  11  . VENTOLIN HFA 108 (90 BASE) MCG/ACT inhaler Inhale 2 puffs into the lungs every 6 (six) hours as needed. For shortness of breath.        REVIEW OF  SYSTEMS: [name] ] denotes positive finding; [  ] denotes negative finding  CARDIOVASCULAR:  [ ]  chest pain   [ ]  chest pressure   [ ]  palpitations   [ ]  orthopnea   [ ]  dyspnea on exertion   [ ]  claudication   [ ]  rest pain   [ ]  DVT   [ ]  phlebitis PULMONARY:   [ ]  productive cough   [ ]  asthma   [ ]  wheezing CONSTITUTIONAL:  [ ]  fever   [ ]  chills  PHYSICAL EXAM: Filed Vitals:   09/23/11 1316  BP: 145/67  Pulse: 76  Temp: 98.3 F (36.8 C)  TempSrc: Oral  Resp: 16  Height: 5\' 1"  (1.549 m)  Weight: 147 lb (66.679 kg)  SpO2: 93%   Body mass index is 27.78 kg/(m^2). GENERAL: The patient is a well-nourished female, in no acute distress. The vital signs are documented above. CARDIOVASCULAR: There is a regular rate and rhythm without significant murmur appreciated.  PULMONARY: There is good air exchange bilaterally without wheezing or rales. She has significant swelling of her right arm up to the mid upper arm. Her fistula  has an audible bruit.  DATA: Duplex of fistula shows patent AVF. Diameters range from 4.5 to 6.6 mm. There are 4 small branches noted.   MEDICAL ISSUES:  End stage renal disease This patient had a right brachiocephalic fistula placed on 08/14/2011. She's had significant right upper extremity swelling. She likely has a central venous stenosis. Is also  Possible that her right IJ tunneled dialysis catheter is potentially obstructing flow. Duplex scan in our office today shows a patent fistula. I have scheduled a fistulogram of her right brachiocephalic AV fistula for 09/28/2011. If we find outflow stenosis amenable to angioplasty we would proceed at the same time. I have discussed the procedure potential complications with the patient. She is agreeable to proceed. We will make further recommendations pending these results.    [name] S [facility] of [place] Beeper: [phone]

## 2011-09-28 NOTE — Op Note (Signed)
PATIENT: Claudia Rich   MRN: [id number] DOB: 1927-04-14    DATE OF PROCEDURE: 09/28/2011  INDICATIONS: JADDA HUNSUCKER is a 78 y.o. female who had a right brachiocephalic AV fistula placed in December. She has had significant right arm swelling. She is brought in for fistulogram to determine if she has a central venous stenosis or at the catheter in her right IJ which was causing the obstruction.  PROCEDURE: Ultrasound-guided access to right brachiocephalic AV fistula. Fistulogram of right brachiocephalic AV fistula.  SURGEON: Di Kindle. Edilia Bo, MD, FACS  ANESTHESIA: Local   EBL: minimal  TECHNIQUE: The patient was taken to the PV lab. She was not sedated. The right upper extremity was prepped and draped in the usual sterile fashion. Under ultrasound guidance the cephalic vein was cannulated in the mid to distal upper arm and a micropuncture sheath introduced over the wire. Fistulogram was then obtained. I attempted to compress the fistula to demonstrate the proximal fistula and arterial anastomosis, however there were 2 competing branches which prohibited this right at the site of cannulation. At the completion of the procedure it small pursestring suture was placed around the catheter which was removed a suture was tied and pressure was held for hemostasis. No immediate complications were noted.   FINDINGS: there are 2 competing branches in the fistula adjacent to the site that was cannulated. The vein was somewhat small. There was no outflow stenosis in the cephalic vein or subclavian vein. However, it appears that the right IJ catheter is obstructing flow in the right brachiocephalic vein.  [name], MD, FACS Vascular and Vein Specialists of [facility]  DATE OF DICTATION:   09/28/2011

## 2011-09-28 NOTE — Preoperative (Signed)
Beta Blockers   Reason not to administer Beta Blockers:Not Applicable 

## 2011-09-28 NOTE — Interval H&P Note (Signed)
History and Physical Interval Note:  09/28/2011 7:07 AM  Claudia Rich  has presented today for surgery, with the diagnosis of instage renal  The various methods of treatment have been discussed with the patient and family. After consideration of risks, benefits and other options for treatment, the patient has consented [id number] OF RIGHT BRACHIALCEPHALIC FISTULA, POSSIBLE PTA OF VENOUS STENOSIS. The patients' history has been reviewed, patient examined, no change in status, stable for surgery.  I have reviewed the patients' chart and labs.  Questions were answered to the patient's satisfaction.     Peace Noyes S

## 2011-09-28 NOTE — Interval H&P Note (Signed)
History and Physical Interval Note:  09/28/2011 9:49 AM  Claudia Rich Jenelle Mages  has presented today for surgery, with the diagnosis of ESRD  The various methods of treatment have been discussed with the patient and family. After consideration of risks, benefits and other options for treatment, the patient has consented to  Procedure(s): REMOVAL OF A DIALYSIS CATHETER INSERTION OF DIALYSIS CATHETER as a surgical intervention .  The patients' history has been reviewed, patient examined, no change in status, stable for surgery.  I have reviewed the patients' chart and labs.  Questions were answered to the patient's satisfaction.    Patient with swelling thought to be secondary to central vein obstruction from dialysis catheter.  Will switch catheter to contralateral side today.   FIELDS,CHARLES E

## 2011-09-29 ENCOUNTER — Encounter (HOSPITAL_COMMUNITY): Payer: Self-pay | Admitting: Vascular Surgery

## 2011-09-29 LAB — POCT I-STAT, CHEM 8
BUN: 48 mg/dL — ABNORMAL HIGH (ref 6–23)
Chloride: 102 mEq/L (ref 96–112)
Creatinine, Ser: 6.8 mg/dL — ABNORMAL HIGH (ref 0.50–1.10)
Potassium: 3.4 mEq/L — ABNORMAL LOW (ref 3.5–5.1)
Sodium: 140 mEq/L (ref 135–145)
TCO2: 28 mmol/L (ref 0–100)

## 2011-09-30 NOTE — Procedures (Unsigned)
VASCULAR LAB EXAM  INDICATION:  End-stage renal disease.  HISTORY: Diabetes: Cardiac: Hypertension:  EXAM:  Right brachiocephalic AV fistula duplex.  IMPRESSION: 1. Patent right brachiocephalic arteriovenous fistula with maximal     velocity of 349 cm/s noted at the anastomosis.  No internal     narrowing visualized at the anastomosis level or throughout the     outflow vein. 2. The right radial artery flow was antegrade. 3. Depth, diameter, velocity, and patent branch diameter measurements     are noted on a separate worksheet.  ___________________________________________ Di Kindle. Edilia Bo, M.D.  CH/MEDQ  D:  09/24/2011  T:  09/24/2011  Job:  [id number]

## 2011-10-05 ENCOUNTER — Telehealth: Payer: Self-pay

## 2011-10-05 ENCOUNTER — Telehealth: Payer: Self-pay | Admitting: Family Medicine

## 2011-10-05 NOTE — Telephone Encounter (Signed)
Returned call to patient's guardian.  Patient has been having right back and side pain.  Rates pain 8/10.  Patient was concerned about pain because she has had right arm swelling with a new fistula. Has talked with vascular surgeon about arm swelling.   Receives dialysis through chest port.  Would like office visit tomorrow morning before dialysis @ 11 am tomorrow.  Appt given tomorrow for 8:45 am tomorrow.  Guardian informed to take patient to urgent care or ED if pain is intolerable before appt tomorrow.  Gaylene Brooks, RN

## 2011-10-05 NOTE — Telephone Encounter (Signed)
Having extreme back pain on right side - wants to talk to nurse

## 2011-10-05 NOTE — Telephone Encounter (Signed)
Pt. Called with c/o continued swelling of right arm from hand to shoulder.  States it isn't getting any better.  Denies numbness or tingling of hand/fingers.  States that has occasional coolness of fingers.  States there is some pain in right arm.  Discussed w/ Dr. Myra Gianotti.  Advised pt. Does not need an ultrasound. Advised pt. Will sched. Appt. To be eval. in office.

## 2011-10-06 ENCOUNTER — Encounter: Payer: Self-pay | Admitting: Family Medicine

## 2011-10-06 ENCOUNTER — Ambulatory Visit (INDEPENDENT_AMBULATORY_CARE_PROVIDER_SITE_OTHER): Payer: Medicare FFS | Admitting: Family Medicine

## 2011-10-06 ENCOUNTER — Encounter: Payer: Self-pay | Admitting: Physician Assistant

## 2011-10-06 DIAGNOSIS — M549 Dorsalgia, unspecified: Secondary | ICD-10-CM | POA: Insufficient documentation

## 2011-10-06 DIAGNOSIS — R600 Localized edema: Secondary | ICD-10-CM | POA: Insufficient documentation

## 2011-10-06 DIAGNOSIS — R609 Edema, unspecified: Secondary | ICD-10-CM

## 2011-10-06 DIAGNOSIS — M546 Pain in thoracic spine: Secondary | ICD-10-CM

## 2011-10-06 MED ORDER — TRAMADOL HCL 50 MG PO TABS: 50.0000 mg | ORAL_TABLET | Freq: Four times a day (QID) | ORAL | Status: DC | PRN

## 2011-10-06 NOTE — Assessment & Plan Note (Signed)
Concern is for central venous stenosis since she has had no improvement in edema with removal of right IJ. She already has an appointment scheduled tomorrow with vascular surgery. Encouraged her to keep this appointment and to ask if there is anything to treat the stenosis if that is indeed what is causing her swelling. No signs of infection today. Give her red flags and reasons to return to clinic if signs or symptoms of infection appear.

## 2011-10-06 NOTE — Progress Notes (Signed)
  Subjective:    Patient ID: Claudia Rich, female    DOB: [birth year], 78 y.o.   MRN: [id number]  HPI Claudia Rich is an 78 year old African American female who is presenting to clinic today with complaints of right upper arm swelling as well as right-sided pain.  Problem #1 right arm swelling: Patient is a dialysis patient who is dialyzed on Tuesday Thursday and Saturday. She previously had a right IJ catheter through which she received dialysis and also had an AV fistula placed in right arm on August 14, 2011. Since that was placed she has complained of significant right arm swelling. On February 4 she had shuntogram which was concerning for right IJ obstruction. Her IJ was removed at that time and left IJ placed. She is currently receiving dialysis through left IJ. However since right IJ has been removed she continues to complain of right upper extremity swelling without any relief. Denies any paresthesias, pallor, erythema of the fistula site. No fevers, nausea, vomiting, chills. No abdominal pain. Patient does still produce urine but only urinates about twice a day which is at her baseline.  #2. Right-sided pain: Patient had episode of right-sided pain yesterday morning that lasted most of the day. She did not take anything for pain relief. She denied any changes in her bowel habits. No dysuria. No hesitancy or frequency. She was able to eat well yesterday. Today the pain is gone. She wants to make sure everything is okay.   Review of Systems See HPI above for review of systems.       Objective:   Physical Exam  Gen:  Alert, cooperative patient who appears stated age in no acute distress.  Vital signs reviewed..  CV:  RRR, Grade I/VI SEM noted.  Fistula with audible bruit.   PULMONARY: There is good air exchange bilaterally without wheezing or rales.  Ext:  She has significant swelling of her right arm up to the mid upper arm.  Left UE and BL LE's without significant edema.  No erythema of  fistula site or arm. MSK:  No tenderness along right-sided ribs. No tenderness paraspinal muscles. No CVA tenderness.      Assessment & Plan:

## 2011-10-06 NOTE — Patient Instructions (Signed)
Based on what I read in your records, the swelling in your arm was thought to be from the catheter you had.   However, you still have swelling since it has been removed.   It is possible the vein there has something called stenosis, which means a tightening or narrowing of the vein. I am not sure if the Vascular Team will be able to fix that problem.  This is something you should ask them about tomorrow when you see them. Be sure to keep that appointment.   It was good to see you today.

## 2011-10-06 NOTE — Assessment & Plan Note (Signed)
Unclear etiology. Not having any pain currently. Refilled Tramadol and asked her to return/call if pain returns.

## 2011-10-07 ENCOUNTER — Other Ambulatory Visit: Payer: Self-pay | Admitting: *Deleted

## 2011-10-07 ENCOUNTER — Ambulatory Visit (INDEPENDENT_AMBULATORY_CARE_PROVIDER_SITE_OTHER): Payer: Medicare FFS | Admitting: Thoracic Diseases

## 2011-10-07 VITALS — BP 131/59 | HR 74 | Resp 18 | Ht 64.0 in | Wt 151.0 lb

## 2011-10-07 DIAGNOSIS — N186 End stage renal disease: Secondary | ICD-10-CM

## 2011-10-07 DIAGNOSIS — M7989 Other specified soft tissue disorders: Secondary | ICD-10-CM | POA: Insufficient documentation

## 2011-10-07 NOTE — Progress Notes (Signed)
VASCULAR & VEIN SPECIALISTS OF [place]  Postoperative Visit hemodialysis access   Date of Surgery: 09/28/11 shuntogram and removal right IJ HD cath placement L IJ cath. Right Brachiocephalic AVF 07/2011 Surgeon: [facility] HD Center: HD T, [place], Sat  HPI: Claudia Rich is a 78 y.o. female who is S/P creation of right upper extremity Hemodialysis access. The patient denies symptoms of numbness, tingling, weakness and complains of pain and severe swelling in the Right UE to hand. Patient is here for post -op evaluation to assess healing and maturation of right B-C AVF .  Pt is on hemodialysis through  left IJ on catheter.  Physical Examination  Filed Vitals:   10/07/11 1414  BP: 131/59  Pulse: 74  Resp: 18    WDWN female in NAD. Gait normal A&O x 3 Lungs clear without wheezes ,rales Heart RRR right upper extremity Incision is healed, upper arm,forearm and hand with severe swelling Skin color is normal   Hand grip is 4/5 sec to swelling and sensation in digits is intact; There is a good thrill and good bruit in the AVF. The graft/fistula is not easily palpable and of adequate size  Assessment/Plan Claudia Rich is a 78 y.o. year old who is s/p creation/revision of right upper extremity Hemodialysis access with continued swelling in Right UE prob secondary to central venous stenosis despite moving IJ catheter to the left Dr. Edilia Bo in to see Pt and suggests another shuntogram to see if this fistula is salvageable or if she needs ligation of same.  Clinic MD: [facility]

## 2011-10-09 ENCOUNTER — Encounter (HOSPITAL_COMMUNITY): Payer: Self-pay | Admitting: Anesthesiology

## 2011-10-09 ENCOUNTER — Encounter (HOSPITAL_COMMUNITY)
Admission: RE | Disposition: A | Discharge status: Home / Self Care | Payer: Self-pay | Source: Ambulatory Visit | Attending: Vascular Surgery

## 2011-10-09 ENCOUNTER — Ambulatory Visit (HOSPITAL_COMMUNITY)
Admission: RE | Admit: 2011-10-09 | Discharge: 2011-10-09 | Disposition: A | Discharge status: Home / Self Care | Payer: Medicare FFS | Source: Ambulatory Visit | Attending: Vascular Surgery | Admitting: Vascular Surgery

## 2011-10-09 ENCOUNTER — Ambulatory Visit (HOSPITAL_COMMUNITY): Payer: Medicare FFS | Admitting: Anesthesiology

## 2011-10-09 DIAGNOSIS — N186 End stage renal disease: Secondary | ICD-10-CM | POA: Insufficient documentation

## 2011-10-09 DIAGNOSIS — K219 Gastro-esophageal reflux disease without esophagitis: Secondary | ICD-10-CM | POA: Insufficient documentation

## 2011-10-09 DIAGNOSIS — J449 Chronic obstructive pulmonary disease, unspecified: Secondary | ICD-10-CM | POA: Insufficient documentation

## 2011-10-09 DIAGNOSIS — T82898A Other specified complication of vascular prosthetic devices, implants and grafts, initial encounter: Secondary | ICD-10-CM | POA: Insufficient documentation

## 2011-10-09 DIAGNOSIS — I120 Hypertensive chronic kidney disease with stage 5 chronic kidney disease or end stage renal disease: Secondary | ICD-10-CM | POA: Insufficient documentation

## 2011-10-09 DIAGNOSIS — I82890 Acute embolism and thrombosis of other specified veins: Secondary | ICD-10-CM | POA: Insufficient documentation

## 2011-10-09 DIAGNOSIS — Z8673 Personal history of transient ischemic attack (TIA), and cerebral infarction without residual deficits: Secondary | ICD-10-CM | POA: Insufficient documentation

## 2011-10-09 DIAGNOSIS — J4489 Other specified chronic obstructive pulmonary disease: Secondary | ICD-10-CM | POA: Insufficient documentation

## 2011-10-09 DIAGNOSIS — E119 Type 2 diabetes mellitus without complications: Secondary | ICD-10-CM | POA: Insufficient documentation

## 2011-10-09 DIAGNOSIS — Y832 Surgical operation with anastomosis, bypass or graft as the cause of abnormal reaction of the patient, or of later complication, without mention of misadventure at the time of the procedure: Secondary | ICD-10-CM | POA: Insufficient documentation

## 2011-10-09 DIAGNOSIS — I252 Old myocardial infarction: Secondary | ICD-10-CM | POA: Insufficient documentation

## 2011-10-09 DIAGNOSIS — M7989 Other specified soft tissue disorders: Secondary | ICD-10-CM

## 2011-10-09 HISTORY — PX: SHUNTOGRAM: SHX5491

## 2011-10-09 LAB — GLUCOSE, CAPILLARY
Glucose-Capillary: 105 mg/dL — ABNORMAL HIGH (ref 70–99)
Glucose-Capillary: 86 mg/dL (ref 70–99)

## 2011-10-09 SURGERY — LIGATION OF ARTERIOVENOUS  FISTULA
Anesthesia: Monitor Anesthesia Care | Site: Arm Upper | Laterality: Right | Wound class: Clean

## 2011-10-09 SURGERY — ASSESSMENT, SHUNT FUNCTION, WITH CONTRAST RADIOGRAPHIC STUDY
Anesthesia: LOCAL

## 2011-10-09 MED ORDER — SODIUM CHLORIDE 0.9 % IV SOLN
INTRAVENOUS | Status: DC | PRN
  Administered 2011-10-09: 13:00:00 via INTRAVENOUS

## 2011-10-09 MED ORDER — ONDANSETRON HCL 4 MG/2ML IJ SOLN
INTRAMUSCULAR | Status: DC | PRN
  Administered 2011-10-09: 4 mg via INTRAVENOUS

## 2011-10-09 MED ORDER — SODIUM CHLORIDE 0.9 % IV SOLN
INTRAVENOUS | Status: DC
  Administered 2011-10-09: 13:00:00 via INTRAVENOUS

## 2011-10-09 MED ORDER — PROPOFOL 10 MG/ML IV BOLUS
INTRAVENOUS | Status: DC | PRN
  Administered 2011-10-09: 30 mg via INTRAVENOUS

## 2011-10-09 MED ORDER — LIDOCAINE HCL (PF) 1 % IJ SOLN
INTRAMUSCULAR | Status: AC
  Filled 2011-10-09: qty 30

## 2011-10-09 MED ORDER — ONDANSETRON HCL 4 MG/2ML IJ SOLN: 4.0000 mg | Freq: Four times a day (QID) | INTRAMUSCULAR | Status: DC | PRN

## 2011-10-09 MED ORDER — HEPARIN (PORCINE) IN NACL 2-0.9 UNIT/ML-% IJ SOLN
INTRAMUSCULAR | Status: AC
  Filled 2011-10-09: qty 1000

## 2011-10-09 MED ORDER — CEFAZOLIN SODIUM 1-5 GM-% IV SOLN
INTRAVENOUS | Status: DC | PRN
  Administered 2011-10-09: 1 g via INTRAVENOUS

## 2011-10-09 MED ORDER — 0.9 % SODIUM CHLORIDE (POUR BTL) OPTIME
TOPICAL | Status: DC | PRN
  Administered 2011-10-09: 1000 mL

## 2011-10-09 MED ORDER — TRAMADOL HCL 50 MG PO TABS: 50.0000 mg | ORAL_TABLET | Freq: Two times a day (BID) | ORAL | Status: AC | PRN

## 2011-10-09 MED ORDER — LIDOCAINE HCL (CARDIAC) 20 MG/ML IV SOLN
INTRAVENOUS | Status: DC | PRN
  Administered 2011-10-09: 80 mg via INTRAVENOUS

## 2011-10-09 MED ORDER — SUFENTANIL CITRATE 50 MCG/ML IV SOLN
INTRAVENOUS | Status: DC | PRN
  Administered 2011-10-09: 10 ug via INTRAVENOUS

## 2011-10-09 MED ORDER — DROPERIDOL 2.5 MG/ML IJ SOLN
INTRAMUSCULAR | Status: DC | PRN
  Administered 2011-10-09: 0.625 mg via INTRAVENOUS

## 2011-10-09 MED ORDER — HYDROMORPHONE HCL PF 1 MG/ML IJ SOLN: 0.2500 mg | INTRAMUSCULAR | Status: DC | PRN

## 2011-10-09 MED ORDER — LIDOCAINE-EPINEPHRINE (PF) 1 %-1:200000 IJ SOLN
INTRAMUSCULAR | Status: DC | PRN
  Administered 2011-10-09: 30 mL

## 2011-10-09 SURGICAL SUPPLY — 34 items
ADH SKN CLS APL DERMABOND .7 (GAUZE/BANDAGES/DRESSINGS) ×1
BANDAGE ELASTIC 4 VELCRO ST LF (GAUZE/BANDAGES/DRESSINGS) ×1 IMPLANT
BANDAGE GAUZE ELAST BULKY 4 IN (GAUZE/BANDAGES/DRESSINGS) ×1 IMPLANT
CANISTER SUCTION 2500CC (MISCELLANEOUS) ×2 IMPLANT
CLIP TI MEDIUM 6 (CLIP) IMPLANT
CLIP TI WIDE RED SMALL 6 (CLIP) IMPLANT
CLOTH BEACON ORANGE TIMEOUT ST (SAFETY) ×2 IMPLANT
COVER SURGICAL LIGHT HANDLE (MISCELLANEOUS) ×4 IMPLANT
DERMABOND ADVANCED (GAUZE/BANDAGES/DRESSINGS) ×1
DERMABOND ADVANCED .7 DNX12 (GAUZE/BANDAGES/DRESSINGS) ×1 IMPLANT
ELECT REM PT RETURN 9FT ADLT (ELECTROSURGICAL) ×2
ELECTRODE REM PT RTRN 9FT ADLT (ELECTROSURGICAL) ×1 IMPLANT
GAUZE SPONGE 4X4 12PLY STRL LF (GAUZE/BANDAGES/DRESSINGS) ×1 IMPLANT
GEL ULTRASOUND 20GR AQUASONIC (MISCELLANEOUS) IMPLANT
GLOVE SS BIOGEL STRL SZ 7 (GLOVE) ×1 IMPLANT
GLOVE SUPERSENSE BIOGEL SZ 7 (GLOVE) ×1
GOWN STRL NON-REIN LRG LVL3 (GOWN DISPOSABLE) ×4 IMPLANT
KIT BASIN OR (CUSTOM PROCEDURE TRAY) ×2 IMPLANT
KIT ROOM TURNOVER OR (KITS) ×2 IMPLANT
NS IRRIG 1000ML POUR BTL (IV SOLUTION) ×2 IMPLANT
PACK CV ACCESS (CUSTOM PROCEDURE TRAY) ×2 IMPLANT
PAD ARMBOARD 7.5X6 YLW CONV (MISCELLANEOUS) ×4 IMPLANT
SPONGE GAUZE 4X4 12PLY (GAUZE/BANDAGES/DRESSINGS) ×3 IMPLANT
SUT ETHILON 3 0 PS 1 (SUTURE) ×1 IMPLANT
SUT PROLENE 6 0 BV (SUTURE) IMPLANT
SUT SILK 0 TIES 10X30 (SUTURE) ×2 IMPLANT
SUT VIC AB 3-0 SH 27 (SUTURE) ×4
SUT VIC AB 3-0 SH 27X BRD (SUTURE) ×1 IMPLANT
SWAB COLLECTION DEVICE MRSA (MISCELLANEOUS) IMPLANT
TOWEL OR 17X24 6PK STRL BLUE (TOWEL DISPOSABLE) ×2 IMPLANT
TOWEL OR 17X26 10 PK STRL BLUE (TOWEL DISPOSABLE) ×2 IMPLANT
TUBE ANAEROBIC SPECIMEN COL (MISCELLANEOUS) IMPLANT
UNDERPAD 30X30 INCONTINENT (UNDERPADS AND DIAPERS) ×2 IMPLANT
WATER STERILE IRR 1000ML POUR (IV SOLUTION) ×2 IMPLANT

## 2011-10-09 NOTE — H&P (Signed)
  VASCULAR AND VEIN SPECIALISTS SHORT STAY H&P  CC:  Swollen arm  HPI: Swollen arm right side same side as fistula  Past Medical History  Diagnosis Date  . Hypertension   . Diabetes mellitus   . Hyperlipidemia   . GERD (gastroesophageal reflux disease)   . Glaucoma   . Hammer toe   . Internal hemorrhoids   . Diverticulosis   . Colon polyps   . Chronic kidney disease     Tues, Thurs, Sat  . Peripheral vascular disease   . Anemia   . COPD (chronic obstructive pulmonary disease)   . Stroke 10/2010  . Myocardial infarction 10/2010    FH:  Non-Contributory  Social HX History  Substance Use Topics  . Smoking status: Never Smoker   . Smokeless tobacco: Never Used  . Alcohol Use: No    Allergies Allergies  Allergen Reactions  . Aspirin     Upset stomach  . Codeine Sulfate Other (See Comments)    unknown  . Meperidine Hcl     REACTION: vomiting     PHYSICAL EXAM  Filed Vitals:   10/09/11 1002  BP: 135/59  Pulse: 78  Temp: 97 F (36.1 C)  Resp: 16    General:  WDWN in NAD HENT: WNL Eyes: Pupils equal Pulmonary: normal non-labored breathing , without Rales, rhonchi,  wheezing Cardiac: RRR, Vascular Exam/Pulses:  Right arm diffusely swollen   Impression: Swelling right arm most likely central vein stenosis  Plan: Shuntogram with possible intervention today  Brahim Dolman E @TODAY @ 10:47 AM

## 2011-10-09 NOTE — Anesthesia Preprocedure Evaluation (Signed)
Anesthesia Evaluation  Patient identified by MRN, date of birth, ID band Patient awake    Reviewed: Allergy & Precautions, H&P , NPO status , Patient's Chart, lab work & pertinent test results  Airway Mallampati: II  Neck ROM: full    Dental   Pulmonary COPD         Cardiovascular hypertension, + Past MI     Neuro/Psych CVA    GI/Hepatic GERD-  ,  Endo/Other  Diabetes mellitus-  Renal/GU ESRFRenal disease     Musculoskeletal   Abdominal   Peds  Hematology   Anesthesia Other Findings   Reproductive/Obstetrics                           Anesthesia Physical Anesthesia Plan  ASA: III  Anesthesia Plan: MAC   Post-op Pain Management:    Induction: Intravenous  Airway Management Planned:   Additional Equipment:   Intra-op Plan:   Post-operative Plan:   Informed Consent: I have reviewed the patients History and Physical, chart, labs and discussed the procedure including the risks, benefits and alternatives for the proposed anesthesia with the patient or authorized representative who has indicated his/her understanding and acceptance.     Plan Discussed with: CRNA and Surgeon  Anesthesia Plan Comments:         Anesthesia Quick Evaluation

## 2011-10-09 NOTE — Interval H&P Note (Signed)
History and Physical Interval Note:  10/09/2011 1:14 PM  Claudia Rich  has presented today for surgery, with the diagnosis of ESRD  The various methods of treatment have been discussed with the patient and family. After consideration of risks, benefits and other options for treatment, the patient has consented to  Procedure(s) (LRB): LIGATION OF ARTERIOVENOUS  FISTULA (Right) as a surgical intervention .  The patients' history has been reviewed, patient examined, no change in status, stable for surgery.  I have reviewed the patients' chart and labs.  Questions were answered to the patient's satisfaction.     Josephina Gip

## 2011-10-09 NOTE — Op Note (Addendum)
Procedure: Right brachial cephalic fistulogram, Left central venogram  Preoperative diagnosis: Venous hypertension  Postoperative diagnosis: Same  Anesthesia: Local  Operative details: After obtaining informed consent, the patient was taken to the PV lab. The patient was placed in supine position on the Angio table. Entire right upper extremity was prepped and draped in usual sterile fashion.   Local anesthesia was infiltrated over the proximal portion of the fistula in the right arm.   Ultrasound was used to identify the fistula in the mid right upper arm.  A micropuncture needle was brought up on the operative field and this was used to directly cannulate the fistula. A micropuncture wire was then threaded into the fistula and the micropuncture sheath threaded over this. Sheath was thoroughly flushed with heparinized saline and the dilator was removed. Contrast angiogram was then performed of the fistula.   There is a short segment occlusion of the innominate at the confluens of the right internal jugular and subclavian veins.  The fistula is patent in its midportion.  I was unable to reflux contrast across the arterial anastomosis secondary to arm swelling.  I felt that the risk vs benefit for attempting to dilate or stent the innominate vein was high.  The segment would have to be ballooned over at least a 5 cm length.  At this point, a 3-0 Monocryl pursestring stitch was placed around the sheath and the sheath removed. Hemostasis was obtained.  A central venogram was then obtained on the left side through a peripheral IV.  This showed a patent left axillary subclavian system.There is a left central dialysis catheter with tips in the right atrium and non obstructive flow in the central veins.  The patient tolerated the procedure well and there were no complications. The patient was taken to the holding area in stable condition.    Operative findings: Occlusion right innominate vein  Patent left central veins                      The patient will have ligation of her right arm AV fistula today for symptomatic relief           She will be scheduled for a left arm AV graft in the near future.           Fabienne Bruns, MD [facility] Specialists of [place] Office: [phone] Pager: [phone]

## 2011-10-09 NOTE — Anesthesia Postprocedure Evaluation (Signed)
Anesthesia Post Note  Patient: Claudia Rich  Procedure(s) Performed: Procedure(s) (LRB): LIGATION OF ARTERIOVENOUS  FISTULA (Right)  Anesthesia type: MAC  Patient location: PACU  Post pain: Pain level controlled  Post assessment: Patient's Cardiovascular Status Stable  Last Vitals:  Filed Vitals:   10/09/11 1537  BP: 139/57  Pulse: 79  Temp:   Resp: 12    Post vital signs: Reviewed and stable  Level of consciousness: sedated  Complications: No apparent anesthesia complications

## 2011-10-09 NOTE — Op Note (Signed)
OPERATIVE REPORT  Date of Surgery: 10/09/2011  Surgeon: Josephina Gip, MD  Assistant: Nurse  Pre-op Diagnosis: ESRD with swollen right upper extremity post brachial-cephalic AV fistula-venous hypertension with central vein stenosis  Post-op Diagnosis: Same Procedure: Procedure(s): LIGATION OF ARTERIOVENOUS  FISTULA right brachial to cephalic  Anesthesia: MAC EBL: 0  Complications: None  Procedure Details: Patient was taken to the operating room placed in the supine position at which time the right upper extremity was prepped with Betadine scrub and solution draped in routine sterile manner. After infiltration with 1% Xylocaine with epinephrine a transverse incision was made through the previous scar in the antecubital area. The entire incision was not reopened but it was extended slightly medially. Dissection was carried down onto the recently created brachial cephalic fistula. Fistula was widely patent. Cephalic vein was carefully dissected free. The tissue was very scarred surrounding this. It was encircled and traced back to adjust adjacent to its anastomosis to the brachial artery were was ligated with 2 2-0 silk ties. There was excellent Doppler flow in the distal brachial and radial artery after ligation of the fistula. There was no flow in the fistula. Adequate hemostasis was achieved and the wound closed in layers with Vicryl in subcuticular fashion with Dermabond patient taken to recovery room in stable condition  Josephina Gip, MD 10/09/2011 2:26 PM

## 2011-10-09 NOTE — Transfer of Care (Signed)
Immediate Anesthesia Transfer of Care Note  Patient: Claudia Rich  Procedure(s) Performed: Procedure(s) (LRB): LIGATION OF ARTERIOVENOUS  FISTULA (Right)  Patient Location: PACU  Anesthesia Type: MAC  Level of Consciousness: awake, oriented and patient cooperative  Airway & Oxygen Therapy: Patient Spontanous Breathing  Post-op Assessment: Report given to PACU RN, Post -op Vital signs reviewed and stable and Patient moving all extremities  Post vital signs: Reviewed and stable  Complications: No apparent anesthesia complications

## 2011-10-09 NOTE — Anesthesia Procedure Notes (Signed)
Performed by: Wilmer Santillo     

## 2011-10-09 NOTE — Progress Notes (Signed)
Claudia Rich updated with patient procedure and potassium level of 3.5  Pt to return to dialysis tomorrow.

## 2011-10-12 LAB — POCT I-STAT, CHEM 8
HCT: 34 % — ABNORMAL LOW (ref 36.0–46.0)
Hemoglobin: 11.6 g/dL — ABNORMAL LOW (ref 12.0–15.0)
Potassium: 3.5 mEq/L (ref 3.5–5.1)
Sodium: 143 mEq/L (ref 135–145)

## 2011-10-13 ENCOUNTER — Encounter: Payer: Self-pay | Admitting: Vascular Surgery

## 2011-10-14 ENCOUNTER — Ambulatory Visit: Payer: Medicare FFS | Admitting: Vascular Surgery

## 2011-10-21 ENCOUNTER — Ambulatory Visit: Payer: Medicare FFS

## 2011-10-23 ENCOUNTER — Encounter: Payer: Self-pay | Admitting: Thoracic Diseases

## 2011-10-23 ENCOUNTER — Ambulatory Visit (INDEPENDENT_AMBULATORY_CARE_PROVIDER_SITE_OTHER): Payer: Medicare FFS | Admitting: Thoracic Diseases

## 2011-10-23 VITALS — BP 144/70 | HR 81 | Resp 20 | Ht 64.0 in | Wt 149.9 lb

## 2011-10-23 DIAGNOSIS — N186 End stage renal disease: Secondary | ICD-10-CM

## 2011-10-23 NOTE — Progress Notes (Signed)
[facility] [place]  Postoperative Visit hemodialysis access   Date of Surgery: 10/09/11 ligation RUE AVF Surgeon: JDL   HPI: Claudia Rich is a 78 y.o. female who is 3 weeks S/P  ligation of Right B-C AVF for arm swelling Hemodialysis access. The patient is doing well. Swelling has dissipated. She C/O min tenderness medial forearm but otherwise has no pain.  Pt is on hemodialysis through  left IJ Cath   Physical Examination  Filed Vitals:   10/23/11 1510  BP: 144/70  Pulse: 81  Resp: 20    WDWN female in NAD.  right upper extremity Incision is healed Skin color is normal   Hand grip is 5/5 and sensation in digits is intact;  The right arm is soft with no swelling or erythema 2+ radial pulses  Assessment/Plan  Claudia Rich is a 78 y.o. year old who is s/p ligation right UA AVF.  From Dr. Darrick Penna note, she will need left arm AVGG  F/U 2 weeks with Dr. Darrick Penna to discuss surgery options  Clinic MD: [facility]

## 2011-11-10 ENCOUNTER — Ambulatory Visit: Payer: Medicare FFS | Admitting: Vascular Surgery

## 2011-11-11 ENCOUNTER — Ambulatory Visit: Payer: Medicare FFS

## 2011-11-18 ENCOUNTER — Encounter: Payer: Self-pay | Admitting: Vascular Surgery

## 2011-11-19 ENCOUNTER — Encounter: Payer: Self-pay | Admitting: *Deleted

## 2011-11-19 ENCOUNTER — Other Ambulatory Visit: Payer: Self-pay | Admitting: *Deleted

## 2011-11-19 ENCOUNTER — Ambulatory Visit (INDEPENDENT_AMBULATORY_CARE_PROVIDER_SITE_OTHER): Payer: Medicare FFS | Admitting: Vascular Surgery

## 2011-11-19 ENCOUNTER — Encounter: Payer: Self-pay | Admitting: Vascular Surgery

## 2011-11-19 VITALS — BP 145/71 | HR 74 | Temp 97.8°F | Ht 63.0 in | Wt 145.0 lb

## 2011-11-19 DIAGNOSIS — N186 End stage renal disease: Secondary | ICD-10-CM

## 2011-11-19 NOTE — Progress Notes (Signed)
Patient is currently dialyzing via a left-sided Diatek catheter. She previously had a left brachiocephalic AV fistula that failed due to a small vein. She subsequently had a right brachiocephalic AV fistula which failed secondary to venous hypertension. She presents for consideration of a new access today. The swelling in her right arm has essentially resolved at this point. She denies any prior symptoms of steal or swelling in her left arm and the fistula was present.  Review of systems: She denies shortness of breath she denies chest pain  Physical exam: Filed Vitals:   11/19/11 0857  BP: 145/71  Pulse: 74  Temp: 97.8 F (36.6 C)  TempSrc: Oral  Height: 5\' 3"  (1.6 m)  Weight: 145 lb (65.772 kg)    Left upper extremity 2+ brachial pulse  Right upper extremity 2+ brachial pulse no significant swelling well-healed antecubital incision  Lower extremity: 2+ femoral and dorsalis pedis pulses bilaterally with no pretibial edema  Chest: Clear to auscultation  Assessment: Patient needs new access. I believe the best option at this point would be to place a left upper arm AV graft. Hopefully she will not have venous hypertension on the left side. I discussed all the risks benefits possible complications and procedure details with the patient and her power of attorney who is her niece today. We will schedule her for her left arm graft placement on 12/07/2011.  Plan: See above  Fabienne Bruns, MD [facility] [place] Office: [phone] Pager: [phone]

## 2011-11-25 ENCOUNTER — Encounter (HOSPITAL_COMMUNITY): Payer: Self-pay | Admitting: Pharmacy Technician

## 2011-12-04 ENCOUNTER — Encounter (HOSPITAL_COMMUNITY): Payer: Self-pay | Admitting: *Deleted

## 2011-12-06 MED ORDER — DEXTROSE 5 % IV SOLN
1.5000 g | INTRAVENOUS | Status: AC
  Administered 2011-12-07: 1.5 g via INTRAVENOUS
  Filled 2011-12-06: qty 1.5

## 2011-12-07 ENCOUNTER — Encounter (HOSPITAL_COMMUNITY): Payer: Self-pay | Admitting: Certified Registered Nurse Anesthetist

## 2011-12-07 ENCOUNTER — Encounter (HOSPITAL_COMMUNITY): Payer: Self-pay | Admitting: *Deleted

## 2011-12-07 ENCOUNTER — Ambulatory Visit (HOSPITAL_COMMUNITY)
Admission: RE | Admit: 2011-12-07 | Discharge: 2011-12-07 | Disposition: A | Discharge status: Home / Self Care | Payer: Medicare FFS | Source: Ambulatory Visit | Attending: Vascular Surgery | Admitting: Vascular Surgery

## 2011-12-07 ENCOUNTER — Ambulatory Visit (HOSPITAL_COMMUNITY): Payer: Medicare FFS | Admitting: Certified Registered Nurse Anesthetist

## 2011-12-07 ENCOUNTER — Encounter (HOSPITAL_COMMUNITY)
Admission: RE | Disposition: A | Discharge status: Home / Self Care | Payer: Self-pay | Source: Ambulatory Visit | Attending: Vascular Surgery

## 2011-12-07 DIAGNOSIS — N186 End stage renal disease: Secondary | ICD-10-CM

## 2011-12-07 HISTORY — PX: AV FISTULA PLACEMENT: SHX1204

## 2011-12-07 LAB — POCT I-STAT 4, (NA,K, GLUC, HGB,HCT)
Glucose, Bld: 127 mg/dL — ABNORMAL HIGH (ref 70–99)
HCT: 36 % (ref 36.0–46.0)
Hemoglobin: 12.2 g/dL (ref 12.0–15.0)
Sodium: 143 mEq/L (ref 135–145)

## 2011-12-07 LAB — APTT: aPTT: 28 seconds (ref 24–37)

## 2011-12-07 LAB — GLUCOSE, CAPILLARY
Glucose-Capillary: 113 mg/dL — ABNORMAL HIGH (ref 70–99)
Glucose-Capillary: 116 mg/dL — ABNORMAL HIGH (ref 70–99)

## 2011-12-07 LAB — PROTIME-INR: INR: 0.98 (ref 0.00–1.49)

## 2011-12-07 SURGERY — INSERTION OF ARTERIOVENOUS (AV) GORE-TEX GRAFT ARM
Anesthesia: General | Site: Arm Upper | Laterality: Left | Wound class: Clean

## 2011-12-07 MED ORDER — MUPIROCIN 2 % EX OINT
TOPICAL_OINTMENT | Freq: Two times a day (BID) | CUTANEOUS | Status: DC
  Filled 2011-12-07: qty 22

## 2011-12-07 MED ORDER — FENTANYL CITRATE 0.05 MG/ML IJ SOLN
INTRAMUSCULAR | Status: DC | PRN
  Administered 2011-12-07 (×4): 50 ug via INTRAVENOUS

## 2011-12-07 MED ORDER — EPHEDRINE SULFATE 50 MG/ML IJ SOLN
INTRAMUSCULAR | Status: DC | PRN
  Administered 2011-12-07 (×3): 5 mg via INTRAVENOUS
  Administered 2011-12-07: 10 mg via INTRAVENOUS

## 2011-12-07 MED ORDER — SODIUM CHLORIDE 0.9 % IV SOLN
INTRAVENOUS | Status: DC
  Administered 2011-12-07: 13:00:00 via INTRAVENOUS

## 2011-12-07 MED ORDER — PROPOFOL 10 MG/ML IV EMUL
INTRAVENOUS | Status: DC | PRN
  Administered 2011-12-07: 150 mg via INTRAVENOUS
  Administered 2011-12-07: 30 mg via INTRAVENOUS
  Administered 2011-12-07: 50 mg via INTRAVENOUS

## 2011-12-07 MED ORDER — ONDANSETRON HCL 4 MG/2ML IJ SOLN
INTRAMUSCULAR | Status: DC | PRN
  Administered 2011-12-07: 4 mg via INTRAVENOUS

## 2011-12-07 MED ORDER — LIDOCAINE HCL (CARDIAC) 20 MG/ML IV SOLN
INTRAVENOUS | Status: DC | PRN
  Administered 2011-12-07: 80 mg via INTRAVENOUS

## 2011-12-07 MED ORDER — PROTAMINE SULFATE 10 MG/ML IV SOLN
INTRAVENOUS | Status: DC | PRN
  Administered 2011-12-07: 10 mg via INTRAVENOUS
  Administered 2011-12-07: 40 mg via INTRAVENOUS

## 2011-12-07 MED ORDER — SODIUM CHLORIDE 0.9 % IV SOLN
INTRAVENOUS | Status: DC | PRN
  Administered 2011-12-07: 13:00:00 via INTRAVENOUS

## 2011-12-07 MED ORDER — OXYCODONE HCL 5 MG PO TABS: 5.0000 mg | ORAL_TABLET | ORAL | Status: AC | PRN

## 2011-12-07 MED ORDER — MUPIROCIN 2 % EX OINT
TOPICAL_OINTMENT | CUTANEOUS | Status: AC
  Administered 2011-12-07: 1
  Filled 2011-12-07: qty 22

## 2011-12-07 MED ORDER — SODIUM CHLORIDE 0.9 % IV SOLN: INTRAVENOUS | Status: DC

## 2011-12-07 MED ORDER — SODIUM CHLORIDE 0.9 % IR SOLN
Status: DC | PRN
  Administered 2011-12-07: 14:00:00

## 2011-12-07 MED ORDER — HYDROMORPHONE HCL PF 1 MG/ML IJ SOLN: 0.2500 mg | INTRAMUSCULAR | Status: DC | PRN

## 2011-12-07 MED ORDER — ONDANSETRON HCL 4 MG/2ML IJ SOLN: 4.0000 mg | Freq: Once | INTRAMUSCULAR | Status: DC | PRN

## 2011-12-07 MED ORDER — HEPARIN SODIUM (PORCINE) 1000 UNIT/ML IJ SOLN
INTRAMUSCULAR | Status: DC | PRN
  Administered 2011-12-07: 5000 [IU] via INTRAVENOUS

## 2011-12-07 MED ORDER — 0.9 % SODIUM CHLORIDE (POUR BTL) OPTIME
TOPICAL | Status: DC | PRN
  Administered 2011-12-07: 1000 mL

## 2011-12-07 SURGICAL SUPPLY — 41 items
ADH SKN CLS APL DERMABOND .7 (GAUZE/BANDAGES/DRESSINGS) ×1
CANISTER SUCTION 2500CC (MISCELLANEOUS) ×2 IMPLANT
CLIP TI MEDIUM 6 (CLIP) ×2 IMPLANT
CLIP TI WIDE RED SMALL 6 (CLIP) ×3 IMPLANT
CLOTH BEACON ORANGE TIMEOUT ST (SAFETY) ×2 IMPLANT
COVER SURGICAL LIGHT HANDLE (MISCELLANEOUS) ×4 IMPLANT
DECANTER SPIKE VIAL GLASS SM (MISCELLANEOUS) ×2 IMPLANT
DERMABOND ADVANCED (GAUZE/BANDAGES/DRESSINGS) ×1
DERMABOND ADVANCED .7 DNX12 (GAUZE/BANDAGES/DRESSINGS) ×1 IMPLANT
ELECT REM PT RETURN 9FT ADLT (ELECTROSURGICAL) ×2
ELECTRODE REM PT RTRN 9FT ADLT (ELECTROSURGICAL) ×1 IMPLANT
GAUZE SPONGE 2X2 8PLY STRL LF (GAUZE/BANDAGES/DRESSINGS) ×1 IMPLANT
GEL ULTRASOUND 20GR AQUASONIC (MISCELLANEOUS) IMPLANT
GLOVE BIO SURGEON STRL SZ7.5 (GLOVE) ×2 IMPLANT
GLOVE BIOGEL PI IND STRL 6.5 (GLOVE) IMPLANT
GLOVE BIOGEL PI IND STRL 7.5 (GLOVE) IMPLANT
GLOVE BIOGEL PI INDICATOR 6.5 (GLOVE) ×6
GLOVE BIOGEL PI INDICATOR 7.5 (GLOVE) ×1
GLOVE ORTHOPEDIC STR SZ6.5 (GLOVE) ×2 IMPLANT
GLOVE SS BIOGEL STRL SZ 7 (GLOVE) IMPLANT
GLOVE SUPERSENSE BIOGEL SZ 7 (GLOVE) ×1
GOWN PREVENTION PLUS XLARGE (GOWN DISPOSABLE) ×3 IMPLANT
GOWN STRL NON-REIN LRG LVL3 (GOWN DISPOSABLE) ×5 IMPLANT
GRAFT GORETEX STRT 4-7X45 (Vascular Products) ×1 IMPLANT
KIT BASIN OR (CUSTOM PROCEDURE TRAY) ×2 IMPLANT
KIT ROOM TURNOVER OR (KITS) ×2 IMPLANT
LOOP VESSEL MINI RED (MISCELLANEOUS) IMPLANT
NS IRRIG 1000ML POUR BTL (IV SOLUTION) ×2 IMPLANT
PACK CV ACCESS (CUSTOM PROCEDURE TRAY) ×2 IMPLANT
PAD ARMBOARD 7.5X6 YLW CONV (MISCELLANEOUS) ×4 IMPLANT
SPONGE GAUZE 2X2 STER 10/PKG (GAUZE/BANDAGES/DRESSINGS) ×1
SPONGE GAUZE 4X4 12PLY (GAUZE/BANDAGES/DRESSINGS) ×1 IMPLANT
SPONGE SURGIFOAM ABS GEL 100 (HEMOSTASIS) IMPLANT
SUT PROLENE 6 0 CC (SUTURE) ×4 IMPLANT
SUT VIC AB 3-0 SH 27 (SUTURE) ×4
SUT VIC AB 3-0 SH 27X BRD (SUTURE) ×2 IMPLANT
SUT VICRYL 4-0 PS2 18IN ABS (SUTURE) ×4 IMPLANT
TOWEL OR 17X24 6PK STRL BLUE (TOWEL DISPOSABLE) ×2 IMPLANT
TOWEL OR 17X26 10 PK STRL BLUE (TOWEL DISPOSABLE) ×2 IMPLANT
UNDERPAD 30X30 INCONTINENT (UNDERPADS AND DIAPERS) ×2 IMPLANT
WATER STERILE IRR 1000ML POUR (IV SOLUTION) ×2 IMPLANT

## 2011-12-07 NOTE — H&P (View-Only) (Signed)
Patient is currently dialyzing via a left-sided Diatek catheter. She previously had a left brachiocephalic AV fistula that failed due to a small vein. She subsequently had a right brachiocephalic AV fistula which failed secondary to venous hypertension. She presents for consideration of a new access today. The swelling in her right arm has essentially resolved at this point. She denies any prior symptoms of steal or swelling in her left arm and the fistula was present.  Review of systems: She denies shortness of breath she denies chest pain  Physical exam: Filed Vitals:   11/19/11 0857  BP: 145/71  Pulse: 74  Temp: 97.8 F (36.6 C)  TempSrc: Oral  Height: 5\' 3"  (1.6 m)  Weight: 145 lb (65.772 kg)    Left upper extremity 2+ brachial pulse  Right upper extremity 2+ brachial pulse no significant swelling well-healed antecubital incision  Lower extremity: 2+ femoral and dorsalis pedis pulses bilaterally with no pretibial edema  Chest: Clear to auscultation  Assessment: Patient needs new access. I believe the best option at this point would be to place a left upper arm AV graft. Hopefully she will not have venous hypertension on the left side. I discussed all the risks benefits possible complications and procedure details with the patient and her power of attorney who is her niece today. We will schedule her for her left arm graft placement on 12/07/2011.  Plan: See above  Fabienne Bruns, MD [facility] [place] Office: [phone] Pager: [phone]

## 2011-12-07 NOTE — Interval H&P Note (Signed)
History and Physical Interval Note:  12/07/2011 1:36 PM  Claudia Rich  has presented today for surgery, with the diagnosis of ESRD  The various methods of treatment have been discussed with the patient and family. After consideration of risks, benefits and other options for treatment, the patient has consented to  Procedure(s) (LRB): INSERTION OF ARTERIOVENOUS (AV) GORE-TEX GRAFT ARM (Left) as a surgical intervention .  The patients' history has been reviewed, patient examined, no change in status, stable for surgery.  I have reviewed the patients' chart and labs.  Questions were answered to the patient's satisfaction.     Ainsley Sanguinetti E

## 2011-12-07 NOTE — Transfer of Care (Signed)
Immediate Anesthesia Transfer of Care Note  Patient: Claudia Rich  Procedure(s) Performed: Procedure(s) (LRB): INSERTION OF ARTERIOVENOUS (AV) GORE-TEX GRAFT ARM (Left)  Patient Location: PACU  Anesthesia Type: General  Level of Consciousness: awake  Airway & Oxygen Therapy: Patient Spontanous Breathing and Patient connected to nasal cannula oxygen  Post-op Assessment: Report given to PACU RN and Post -op Vital signs reviewed and stable  Post vital signs: Reviewed  Complications: No apparent anesthesia complications

## 2011-12-07 NOTE — Op Note (Signed)
Procedure: Left Upper Arm AV graft  Preop: ESRD  Postop: ESRD  Anesthesia: General  Asst : Lianne Cure, PA-C  Findings:4-7 mm PTFE end to end to axillary vein   Procedure Details: The left upper extremity was prepped and draped in usual sterile fashion.  A longitudinal incision was then made near the antecubital crease the left arm.  There were no suitable antecubital veins for outflow.   The incision was carried into the subcutaneous tissues down to level of the brachial artery.  Next the brachial artery was dissected free in the medial portion incision. The artery was  3-4 mm in diameter. The vessel loops were placed proximal and distal to the planned site of arteriotomy. At this point, a longitudinal incision was made in the axilla and carried through the subcutaneous tissues and fascia to expose the axillary vein.  The nerves were protected.  The vein was approximately 4-5 mm in diameter. Next, a subcutaneous tunnel was created connecting the upper arm to the lower arm incision in an arcing configuration over the biceps muscle.  A 4-7 mm PTFE graft was then brought through this subcutaneous tunnel. The patient was given 5000 units of intravenous heparin. After appropriate circulation time, the vessel loops were used to control the artery. A longitudinal opening was made in the right brachial artery.  The 4 mm end of the graft was beveled and sewn end to side to the artery using a 6 0 prolene.  At completion of the anastomosis the artery was forward bled, backbled and thoroughly flushed.  The anastomosis was secured, vessel loops were released and there was palpable pulse in the graft.  The graft was clamped just above the arterial anastomosis with a fistula clamp. The graft was then pulled taut to length at the axillary incision.  The axillary vein was controlled with a fine bulldog clamp in the upper axilla.  The vein was transected and spatulated.  The distal end of the graft was then beveled  and sewn end to end to the vein using a running 6 0 prolene.  Just prior to completion of the anastomosis, everything was forward bled, back bled and thoroughly flushed.  The anastomosis was secured and the fistula clamp removed from the proximal graft.  A thrill was immediately palpable in the graft. The patient was given 50 mg of protamine to assist with hemostasis.  After hemostasis was obtained, the subcutaneous tissues were reapproximated using a running 3-0 Vicryl suture. The skin was then closed with a 4 0 Vicryl subcuticular stitch. Dermabond was applied to the skin incisions.  The patient tolerated the procedure well and there were no complications.  Instrument sponge and needle count was correct at the end of the case.  The patient was taken to the recovery room in stable condition. The patient had audible radial and ulnar doppler signals at the end of the case.  Fabienne Bruns, MD [facility] [place] Office: [phone] Pager: [phone]

## 2011-12-07 NOTE — Discharge Instructions (Signed)
Instructions Following General Anesthetic, Adult A nurse specialized in giving anesthesia (anesthetist) or a doctor specialized in giving anesthesia (anesthesiologist) gave you a medicine that made you sleep while a procedure was performed. For as long as 24 hours following this procedure, you may feel:  Dizzy.   Weak.   Drowsy.  AFTER THE PROCEDURE After surgery, you will be taken to the recovery area where a nurse will monitor your progress. You will be allowed to go home when you are awake, stable, taking fluids well, and without complications. For the first 24 hours following an anesthetic:  Have a responsible person with you.   Do not drive a car. If you are alone, do not take public transportation.   Do not drink alcohol.   Do not take medicine that has not been prescribed by your caregiver.   Do not sign important papers or make important decisions.   You may resume normal diet and activities as directed.   Change bandages (dressings) as directed.   Only take over-the-counter or prescription medicines for pain, discomfort, or fever as directed by your caregiver.  If you have questions or problems that seem related to the anesthetic, call the hospital and ask for the anesthetist or anesthesiologist on call. SEEK IMMEDIATE MEDICAL CARE IF:   You develop a rash.   You have difficulty breathing.   You have chest pain.   You develop any allergic problems.  Document Released: 11/16/2000 Document Revised: 07/30/2011 Document Reviewed: 06/27/2007 Exit[employer] Patient Information 2012 [employer]. 

## 2011-12-07 NOTE — Anesthesia Preprocedure Evaluation (Signed)
Anesthesia Evaluation  Patient identified by MRN, date of birth, ID band Patient awake    Reviewed: Allergy & Precautions, H&P , NPO status , Patient's Chart, lab work & pertinent test results, reviewed documented beta blocker date and time   Airway Mallampati: II      Dental  (+) Edentulous Upper and Edentulous Lower   Pulmonary  breath sounds clear to auscultation        Cardiovascular Rhythm:Regular Rate:Normal     Neuro/Psych    GI/Hepatic   Endo/Other    Renal/GU      Musculoskeletal   Abdominal   Peds  Hematology   Anesthesia Other Findings   Reproductive/Obstetrics                           Anesthesia Physical Anesthesia Plan  ASA: III  Anesthesia Plan: General   Post-op Pain Management:    Induction: Intravenous  Airway Management Planned: LMA  Additional Equipment:   Intra-op Plan:   Post-operative Plan: Extubation in OR  Informed Consent: I have reviewed the patients History and Physical, chart, labs and discussed the procedure including the risks, benefits and alternatives for the proposed anesthesia with the patient or authorized representative who has indicated his/her understanding and acceptance.     Plan Discussed with:   Anesthesia Plan Comments: (ESRD last HD 4/13 K-3.5 Type 2 DM glucose 127 Htn   Plan GA  Kipp Brood, MD)        Anesthesia Quick Evaluation

## 2011-12-07 NOTE — Anesthesia Postprocedure Evaluation (Signed)
  Anesthesia Post-op Note  Patient: Claudia Rich  Procedure(s) Performed: Procedure(s) (LRB): INSERTION OF ARTERIOVENOUS (AV) GORE-TEX GRAFT ARM (Left)  Patient Location: PACU  Anesthesia Type: MAC and General  Level of Consciousness: awake  Airway and Oxygen Therapy: Patient Spontanous Breathing  Post-op Pain: mild  Post-op Assessment: Post-op Vital signs reviewed  Post-op Vital Signs: Reviewed  Complications: No apparent anesthesia complications

## 2011-12-08 ENCOUNTER — Encounter (HOSPITAL_COMMUNITY): Payer: Self-pay | Admitting: Vascular Surgery

## 2012-01-05 ENCOUNTER — Encounter: Payer: Self-pay | Admitting: Vascular Surgery

## 2012-01-06 ENCOUNTER — Ambulatory Visit (INDEPENDENT_AMBULATORY_CARE_PROVIDER_SITE_OTHER): Payer: Medicare FFS | Admitting: Vascular Surgery

## 2012-01-06 ENCOUNTER — Encounter: Payer: Self-pay | Admitting: Vascular Surgery

## 2012-01-06 VITALS — BP 142/54 | HR 77 | Resp 18 | Ht 63.0 in | Wt 150.0 lb

## 2012-01-06 DIAGNOSIS — N186 End stage renal disease: Secondary | ICD-10-CM

## 2012-01-06 NOTE — Progress Notes (Signed)
[facility]  Patient name: Claudia Rich MRN: [id number] DOB: Jan 12, 1927 Sex: female  REASON FOR VISIT: follow up after new left upper arm AV graft  HPI: Claudia Rich is a 78 y.o. female who had a new left upper arm AV graft placed on 12/07/2011 by Dr. Darrick Penna. She comes in for a follow up visit. His had no problems with her graft and no arm pain or hand pain on the left. Dialyzes with her catheter.   REVIEW OF SYSTEMS: [name] ] denotes positive finding; [  ] denotes negative finding  CARDIOVASCULAR:  [ ]  chest pain   [ ]  dyspnea on exertion    CONSTITUTIONAL:  [ ]  fever   [ ]  chills  PHYSICAL EXAM: Filed Vitals:   01/06/12 1354  BP: 142/54  Pulse: 77  Resp: 18  Height: 5\' 3"  (1.6 m)  Weight: 150 lb (68.04 kg)   Body mass index is 26.57 kg/(m^2). GENERAL: The patient is a well-nourished female, in no acute distress. The vital signs are documented above. CARDIOVASCULAR: There is a regular rate and rhythm  PULMONARY: There is good air exchange bilaterally without wheezing or rales. The graft in the upper arm on the left has an excellent bruit and thrill. She has a palpable radial pulse. Her incisions are healing nicely.  MEDICAL ISSUES: At this point it is safe to use her graft. Once we noticed working well her catheter can be removed. We will see her back as needed.  Juventino Pavone S [facility] [place] Beeper: [phone]

## 2012-01-07 ENCOUNTER — Ambulatory Visit: Payer: Medicare FFS | Admitting: Vascular Surgery

## 2012-01-22 ENCOUNTER — Other Ambulatory Visit: Payer: Self-pay | Admitting: *Deleted

## 2012-01-27 ENCOUNTER — Ambulatory Visit (HOSPITAL_COMMUNITY)
Admission: RE | Admit: 2012-01-27 | Discharge: 2012-01-27 | Disposition: A | Discharge status: Home / Self Care | Payer: Medicare FFS | Source: Ambulatory Visit | Attending: Vascular Surgery | Admitting: Vascular Surgery

## 2012-01-27 ENCOUNTER — Encounter: Payer: Self-pay | Admitting: Physician Assistant

## 2012-01-27 DIAGNOSIS — D649 Anemia, unspecified: Secondary | ICD-10-CM | POA: Insufficient documentation

## 2012-01-27 DIAGNOSIS — K5730 Diverticulosis of large intestine without perforation or abscess without bleeding: Secondary | ICD-10-CM | POA: Insufficient documentation

## 2012-01-27 DIAGNOSIS — N189 Chronic kidney disease, unspecified: Secondary | ICD-10-CM | POA: Insufficient documentation

## 2012-01-27 DIAGNOSIS — Z8673 Personal history of transient ischemic attack (TIA), and cerebral infarction without residual deficits: Secondary | ICD-10-CM | POA: Insufficient documentation

## 2012-01-27 DIAGNOSIS — I129 Hypertensive chronic kidney disease with stage 1 through stage 4 chronic kidney disease, or unspecified chronic kidney disease: Secondary | ICD-10-CM | POA: Insufficient documentation

## 2012-01-27 DIAGNOSIS — J449 Chronic obstructive pulmonary disease, unspecified: Secondary | ICD-10-CM | POA: Insufficient documentation

## 2012-01-27 DIAGNOSIS — E785 Hyperlipidemia, unspecified: Secondary | ICD-10-CM | POA: Insufficient documentation

## 2012-01-27 DIAGNOSIS — I739 Peripheral vascular disease, unspecified: Secondary | ICD-10-CM | POA: Insufficient documentation

## 2012-01-27 DIAGNOSIS — Z7982 Long term (current) use of aspirin: Secondary | ICD-10-CM | POA: Insufficient documentation

## 2012-01-27 DIAGNOSIS — E119 Type 2 diabetes mellitus without complications: Secondary | ICD-10-CM | POA: Insufficient documentation

## 2012-01-27 DIAGNOSIS — J4489 Other specified chronic obstructive pulmonary disease: Secondary | ICD-10-CM | POA: Insufficient documentation

## 2012-01-27 DIAGNOSIS — N186 End stage renal disease: Secondary | ICD-10-CM

## 2012-01-27 DIAGNOSIS — Z452 Encounter for adjustment and management of vascular access device: Secondary | ICD-10-CM | POA: Insufficient documentation

## 2012-01-27 NOTE — H&P (Addendum)
01/27/2012 Claudia Rich [id number] 1927-07-12        May stick graft immediately   May stick graft on designated area only: now   Do not stick graft for 0 weeks   No diagram  01/27/2012 Claudia Rich [id number] 28-Jun-1927      Comments: none   May stick graft immediately   May stick graft on designated area only: any place on graft site.2   Do not stick graft for 0 weeks     [facility] [place] HISTORY AND PHYSICAL   History of Present Illness:  Patient is a 78 y.o. year old female who presents for evaluation of left IJ diatek catheter removal.  Patient has a working left upper arm fistula for dialysis.  Past Medical History  Diagnosis Date  . Hypertension   . Diabetes mellitus   . Hyperlipidemia   . GERD (gastroesophageal reflux disease)   . Glaucoma   . Hammer toe   . Internal hemorrhoids   . Diverticulosis   . Colon polyps   . Peripheral vascular disease   . Anemia   . COPD (chronic obstructive pulmonary disease)   . Stroke 10/2010  . Myocardial infarction 10/2010  . Chronic kidney disease     Tues, Thurs, Sat @ [address]     Past Surgical History  Procedure Date  . Av fistula repair 02/16/2011    Revision of left brachiocephalic arteriovenous fistula with patch angioplasty of left brachiocephalic fistula  . Av fistula placement, brachiocephalic 09/24/2010    Left  . Av fistula placement   . Cataract extraction   . Abdominal hysterectomy   . Av fistula placement 08/14/2011    Procedure: ARTERIOVENOUS (AV) FISTULA CREATION;  Surgeon: Chuck Hint, MD;  Location: [facility] OR;  Service: Vascular;  Laterality: Right;  Right Arm Arteriovenous Fistula Creation/ contact pt's niece,[name] for any information to be relayed to pt: # [phone]  . Insertion of dialysis catheter 09/28/2011    Procedure: INSERTION OF DIALYSIS CATHETER;  Surgeon: Sherren Kerns, MD;  Location: [facility] OR;  Service: Vascular;  Laterality: Left;   Insertion of Dialysis Catheter left IJ  . Eye surgery   . Av fistula placement 12/07/2011    Procedure: INSERTION OF ARTERIOVENOUS (AV) GORE-TEX GRAFT ARM;  Surgeon: Sherren Kerns, MD;  Location: [facility] OR;  Service: Vascular;  Laterality: Left;     Social History History  Substance Use Topics  . Smoking status: Never Smoker   . Smokeless tobacco: Never Used  . Alcohol Use: No    Family History Family History  Problem Relation Age of Onset  . Anesthesia problems Neg Hx     Allergies  Allergies  Allergen Reactions  . Aspirin     Upset stomach  . Codeine Sulfate Other (See Comments)    unknown  . Meperidine Hcl     REACTION: vomiting     Current Outpatient Prescriptions  Medication Sig Dispense Refill  . amLODipine (NORVASC) 10 MG tablet Take 10 mg by mouth daily.      Marland Kitchen aspirin 81 MG chewable tablet Chew 81 mg by mouth daily.      Marland Kitchen atorvastatin (LIPITOR) 80 MG tablet Take 80 mg by mouth daily.      . B Complex-C-Folic Acid (RENAL MULTIVITAMIN FORMULA PO) Take 10 mg by mouth daily.       . calcium acetate (PHOSLO) 667 MG capsule Take 667 mg by mouth 2 (two) times daily.        Marland Kitchen  glipiZIDE (GLUCOTROL) 10 MG tablet Take 20 mg by mouth 2 (two) times daily before a meal.      . metoprolol tartrate (LOPRESSOR) 25 MG tablet Take 25 mg by mouth See admin instructions. Take 1 tablet twice daily on non-dialysis days. Take 1 tablet in the evening on Tuesday, Thursday and Saturday.      . ondansetron (ZOFRAN) 4 MG tablet Take 4 mg by mouth every 8 (eight) hours as needed. For nausea.      . ranitidine (ZANTAC) 150 MG tablet Take 150 mg by mouth 2 (two) times daily.      . sitaGLIPtin (JANUVIA) 25 MG tablet Take 25 mg by mouth daily.      . VENTOLIN HFA 108 (90 BASE) MCG/ACT inhaler Inhale 2 puffs into the lungs every 6 (six) hours as needed. For shortness of breath.        ROS:   General:  No weight loss, Fever, chills  HEENT: No recent headaches, no nasal bleeding, no visual  changes, no sore throat  Neurologic: No dizziness, blackouts, seizures. No recent symptoms of stroke or mini- stroke. No recent episodes of slurred speech, or temporary blindness.  Cardiac: No recent episodes of chest pain/pressure, no shortness of breath at rest.  No shortness of breath with exertion.  Denies history of atrial fibrillation or irregular heartbeat  Vascular: No history of rest pain in feet.  No history of claudication.  No history of non-healing ulcer, No history of DVT   Pulmonary: No home oxygen, no productive cough, no hemoptysis,  No asthma or wheezing  Musculoskeletal:  [ ]  Arthritis, [ ]  Low back pain,  [ ]  Joint pain  Hematologic:No history of hypercoagulable state.  No history of easy bleeding.  No history of anemia  Gastrointestinal: No hematochezia or melena,  No gastroesophageal reflux, no trouble swallowing  Urinary: [ ]  chronic Kidney disease, [ ]  on HD - [ ]  MWF or [ ]  TTHS, [ ]  Burning with urination, [ ]  Frequent urination, [ ]  Difficulty urinating;   Skin: No rashes  Psychological: No history of anxiety,  No history of depression   Physical Examination  There were no vitals filed for this visit.  There is no height or weight on file to calculate BMI.  General:  Alert and oriented, no acute distress HEENT: Normal Neck: No bruit or JVD Pulmonary: Clear to auscultation bilaterally Cardiac: Regular Rate and Rhythm without murmur Abdomen: Soft, non-tender, non-distended, no mass, no scars Skin: No rash Extremity Pulses:  2+ radial, brachial, femoral, dorsalis pedis, posterior tibial pulses bilaterally Musculoskeletal: No deformity or edema  Neurologic: Upper and lower extremity motor 5/5 and symmetric  DATA:    ASSESSMENT: Planned diatek removal left IJ.   PLAN:  Fabienne Bruns, MD [facility] [place] Office: [phone] Pager: [phone]   [facility] SHORT STAY H&P  CC:  Catheter removal    HPI: CKD with functional fistula.  She is here for removal of diatek catheter.  Past Medical History  Diagnosis Date  . Hypertension   . Diabetes mellitus   . Hyperlipidemia   . GERD (gastroesophageal reflux disease)   . Glaucoma   . Hammer toe   . Internal hemorrhoids   . Diverticulosis   . Colon polyps   . Peripheral vascular disease   . Anemia   . COPD (chronic obstructive pulmonary disease)   . Stroke 10/2010  . Myocardial infarction 10/2010  . Chronic kidney disease  Tues, Thurs, Sat @ [address]     FH:  Non-Contributory  History   Social History  . Marital Status: Widowed    Spouse Name: N/A    Number of Children: N/A  . Years of Education: N/A   Occupational History  . Not on file.   Social History Main Topics  . Smoking status: Never Smoker   . Smokeless tobacco: Never Used  . Alcohol Use: No  . Drug Use: No  . Sexually Active: Not on file   Other Topics Concern  . Not on file   Social History Narrative  . No narrative on file    Allergies  Allergen Reactions  . Aspirin     Upset stomach  . Codeine Sulfate Other (See Comments)    unknown  . Meperidine Hcl     REACTION: vomiting    Current Outpatient Prescriptions  Medication Sig Dispense Refill  . amLODipine (NORVASC) 10 MG tablet Take 10 mg by mouth daily.      Marland Kitchen aspirin 81 MG chewable tablet Chew 81 mg by mouth daily.      Marland Kitchen atorvastatin (LIPITOR) 80 MG tablet Take 80 mg by mouth daily.      . B Complex-C-Folic Acid (RENAL MULTIVITAMIN FORMULA PO) Take 10 mg by mouth daily.       . calcium acetate (PHOSLO) 667 MG capsule Take 667 mg by mouth 2 (two) times daily.        Marland Kitchen glipiZIDE (GLUCOTROL) 10 MG tablet Take 20 mg by mouth 2 (two) times daily before a meal.      . metoprolol tartrate (LOPRESSOR) 25 MG tablet Take 25 mg by mouth See admin instructions. Take 1 tablet twice daily on non-dialysis days. Take 1 tablet in the evening on Tuesday, Thursday and Saturday.      .  ondansetron (ZOFRAN) 4 MG tablet Take 4 mg by mouth every 8 (eight) hours as needed. For nausea.      . ranitidine (ZANTAC) 150 MG tablet Take 150 mg by mouth 2 (two) times daily.      . sitaGLIPtin (JANUVIA) 25 MG tablet Take 25 mg by mouth daily.      . VENTOLIN HFA 108 (90 BASE) MCG/ACT inhaler Inhale 2 puffs into the lungs every 6 (six) hours as needed. For shortness of breath.        ROS:  See HPI  PHYSICAL EXAM  There were no vitals filed for this visit.  Gen:  WNL HEENT:  PERRL Neck:  supple Left upper arm fistula with palpable thrill.   Lab/X-ray:  Impression: This is a 78 y.o. female here for diatek catheter removal  Plan:  Removal of left diatek catheter   Lianne Cure , PA-C [facility] [phone] 01/27/2012 1:34 PM

## 2012-01-27 NOTE — Progress Notes (Signed)
D/C home; left chest dressing unremarkable; no c/o; verbalizes discharge instructions

## 2012-03-15 ENCOUNTER — Other Ambulatory Visit (HOSPITAL_COMMUNITY): Payer: Self-pay | Admitting: Nephrology

## 2012-03-15 DIAGNOSIS — N186 End stage renal disease: Secondary | ICD-10-CM

## 2012-03-16 ENCOUNTER — Ambulatory Visit (HOSPITAL_COMMUNITY)
Admission: RE | Admit: 2012-03-16 | Discharge: 2012-03-16 | Disposition: A | Discharge status: Home / Self Care | Payer: Medicare FFS | Source: Ambulatory Visit | Attending: Nephrology | Admitting: Nephrology

## 2012-03-16 ENCOUNTER — Encounter (HOSPITAL_COMMUNITY): Payer: Self-pay

## 2012-03-16 ENCOUNTER — Other Ambulatory Visit (HOSPITAL_COMMUNITY): Payer: Self-pay | Admitting: Nephrology

## 2012-03-16 DIAGNOSIS — Z992 Dependence on renal dialysis: Secondary | ICD-10-CM | POA: Insufficient documentation

## 2012-03-16 DIAGNOSIS — I120 Hypertensive chronic kidney disease with stage 5 chronic kidney disease or end stage renal disease: Secondary | ICD-10-CM | POA: Insufficient documentation

## 2012-03-16 DIAGNOSIS — E119 Type 2 diabetes mellitus without complications: Secondary | ICD-10-CM | POA: Insufficient documentation

## 2012-03-16 DIAGNOSIS — N186 End stage renal disease: Secondary | ICD-10-CM | POA: Insufficient documentation

## 2012-03-16 DIAGNOSIS — Y849 Medical procedure, unspecified as the cause of abnormal reaction of the patient, or of later complication, without mention of misadventure at the time of the procedure: Secondary | ICD-10-CM | POA: Insufficient documentation

## 2012-03-16 DIAGNOSIS — J449 Chronic obstructive pulmonary disease, unspecified: Secondary | ICD-10-CM | POA: Insufficient documentation

## 2012-03-16 DIAGNOSIS — E785 Hyperlipidemia, unspecified: Secondary | ICD-10-CM | POA: Insufficient documentation

## 2012-03-16 DIAGNOSIS — T82898A Other specified complication of vascular prosthetic devices, implants and grafts, initial encounter: Secondary | ICD-10-CM | POA: Insufficient documentation

## 2012-03-16 DIAGNOSIS — K219 Gastro-esophageal reflux disease without esophagitis: Secondary | ICD-10-CM | POA: Insufficient documentation

## 2012-03-16 DIAGNOSIS — J4489 Other specified chronic obstructive pulmonary disease: Secondary | ICD-10-CM | POA: Insufficient documentation

## 2012-03-16 LAB — POCT I-STAT 4, (NA,K, GLUC, HGB,HCT)
HCT: 33 % — ABNORMAL LOW (ref 36.0–46.0)
Hemoglobin: 11.2 g/dL — ABNORMAL LOW (ref 12.0–15.0)

## 2012-03-16 MED ORDER — IOHEXOL 300 MG/ML  SOLN
150.0000 mL | Freq: Once | INTRAMUSCULAR | Status: AC | PRN
  Administered 2012-03-16: 75 mL via INTRAVENOUS

## 2012-03-16 MED ORDER — MIDAZOLAM HCL 2 MG/2ML IJ SOLN
INTRAMUSCULAR | Status: AC
  Filled 2012-03-16: qty 4

## 2012-03-16 MED ORDER — HEPARIN SODIUM (PORCINE) 1000 UNIT/ML IJ SOLN
INTRAMUSCULAR | Status: AC
  Administered 2012-03-16: 3000 [IU]
  Filled 2012-03-16: qty 1

## 2012-03-16 MED ORDER — MIDAZOLAM HCL 5 MG/5ML IJ SOLN
INTRAMUSCULAR | Status: AC | PRN
  Administered 2012-03-16: 1.5 mg via INTRAVENOUS
  Administered 2012-03-16: 0.5 mg via INTRAVENOUS

## 2012-03-16 MED ORDER — ALTEPLASE 100 MG IV SOLR
2.0000 mg | Freq: Once | INTRAVENOUS | Status: DC
  Filled 2012-03-16: qty 2

## 2012-03-16 MED ORDER — FENTANYL CITRATE 0.05 MG/ML IJ SOLN
INTRAMUSCULAR | Status: AC
  Filled 2012-03-16: qty 4

## 2012-03-16 MED ORDER — ALTEPLASE 100 MG IV SOLR
2.0000 mg | Freq: Once | INTRAVENOUS | Status: AC
  Administered 2012-03-16: 4 mg
  Filled 2012-03-16: qty 2

## 2012-03-16 MED ORDER — FENTANYL CITRATE 0.05 MG/ML IJ SOLN
INTRAMUSCULAR | Status: AC | PRN
  Administered 2012-03-16: 50 ug via INTRAVENOUS
  Administered 2012-03-16: 25 ug via INTRAVENOUS

## 2012-03-16 NOTE — H&P (Signed)
Chief Complaint: Clotted AV graft Referring Physician:Coladonato HPI: Claudia Rich is an 78 y.o. female who had a new (L)UE AV graft placed back in April of this year. She has been using it well until yesterday when they noted it to be clotted. She is now referred for possible declot procedure. She is otherwise well with no c/o. PMHx reviewed.  Past Medical History:  Past Medical History  Diagnosis Date  . Hypertension   . Diabetes mellitus   . Hyperlipidemia   . GERD (gastroesophageal reflux disease)   . Glaucoma   . Hammer toe   . Internal hemorrhoids   . Diverticulosis   . Colon polyps   . Peripheral vascular disease   . Anemia   . COPD (chronic obstructive pulmonary disease)   . Stroke 10/2010  . Myocardial infarction 10/2010  . Chronic kidney disease     Tues, Thurs, Sat @ [address]     Past Surgical History:  Past Surgical History  Procedure Date  . Av fistula repair 02/16/2011    Revision of left brachiocephalic arteriovenous fistula with patch angioplasty of left brachiocephalic fistula  . Av fistula placement, brachiocephalic 09/24/2010    Left  . Av fistula placement   . Cataract extraction   . Abdominal hysterectomy   . Av fistula placement 08/14/2011    Procedure: ARTERIOVENOUS (AV) FISTULA CREATION;  Surgeon: Chuck Hint, MD;  Location: [facility] OR;  Service: Vascular;  Laterality: Right;  Right Arm Arteriovenous Fistula Creation/ contact pt's niece,[name] for any information to be relayed to pt: # [phone]  . Insertion of dialysis catheter 09/28/2011    Procedure: INSERTION OF DIALYSIS CATHETER;  Surgeon: Sherren Kerns, MD;  Location: [facility] OR;  Service: Vascular;  Laterality: Left;  Insertion of Dialysis Catheter left IJ  . Eye surgery   . Av fistula placement 12/07/2011    Procedure: INSERTION OF ARTERIOVENOUS (AV) GORE-TEX GRAFT ARM;  Surgeon: Sherren Kerns, MD;  Location: [facility] OR;  Service: Vascular;  Laterality: Left;    Family History:    Family History  Problem Relation Age of Onset  . Anesthesia problems Neg Hx     Social History:  reports that she has never smoked. She has never used smokeless tobacco. She reports that she does not drink alcohol or use illicit drugs.  Allergies:  Allergies  Allergen Reactions  . Aspirin     Upset stomach  . Codeine Sulfate Other (See Comments)    unknown  . Meperidine Hcl     REACTION: vomiting    Medications: amLODipine (NORVASC) 10 MG tablet (Taking) Sig - Route: Take 10 mg by mouth daily. - Oral Class: Historical Med aspirin 81 MG chewable tablet (Taking) Sig - Route: Chew 81 mg by mouth daily. - Oral Class: Historical Med atorvastatin (LIPITOR) 80 MG tablet (Taking) Sig - Route: Take 80 mg by mouth daily. - Oral Class: Historical Med B Complex-C-Folic Acid (RENAL MULTIVITAMIN FORMULA PO) (Taking) Sig - Route: Take 10 mg by mouth daily. - Oral Class: Historical Med Number of times this order has been changed since signing: 1 Order [address] calcium acetate (PHOSLO) 667 MG capsule (Taking) Sig - Route: Take 667 mg by mouth 2 (two) times daily. - Oral Class: Historical Med Number of times this order has been changed since signing: 1 Order [address] glipiZIDE (GLUCOTROL) 10 MG tablet (Taking) Sig - Route: Take 20 mg by mouth 2 (two) times daily before a meal. - Oral Class: Historical Med  metoprolol tartrate (LOPRESSOR) 25 MG tablet (Taking) Sig - Route: Take 25 mg by mouth See admin instructions. Take 1 tablet twice daily on non-dialysis days. Take 1 tablet in the evening on Tuesday, Thursday and Saturday. - Oral Class: Historical Med Number of times this order has been changed since signing: 2 Order [address] ondansetron (ZOFRAN) 4 MG tablet (Taking) Sig - Route: Take 4 mg by mouth every 8 (eight) hours as needed. For nausea. - Oral Class: Historical Med Number of times this order has been changed since signing: 1 Order [address] ranitidine (ZANTAC) 150 MG tablet (Taking) Sig - Route:  Take 150 mg by mouth 2 (two) times daily. - Oral Class: Historical Med sitaGLIPtin (JANUVIA) 25 MG tablet (Taking) Sig - Route: Take 25 mg by mouth daily. - Oral Class: Historical Med VENTOLIN HFA 108 (90 BASE) MCG/ACT inhaler (Taking) 12/17/2010 Sig - Route: Inhale 2 puffs into the lungs every 6 (six) hours as needed. For shortness of breath. - Inhalation  Please HPI for pertinent positives, otherwise complete 10 system ROS negative.  Physical Exam: Blood pressure 155/66, pulse 67, temperature 98.1 F (36.7 C), temperature source Oral, SpO2 96.00%. There is no height or weight on file to calculate BMI.   General Appearance:  Alert, cooperative, no distress, appears stated age  Head:  Normocephalic, without obvious abnormality, atraumatic  ENT: Unremarkable  Neck: Supple, symmetrical, trachea midline, no adenopathy, thyroid: not enlarged, symmetric, no tenderness/mass/nodules  Lungs:   Clear to auscultation bilaterally, no w/r/r, respirations unlabored without use of accessory muscles.  Chest Wall:  No tenderness or deformity  Heart:  Regular rate and rhythm, S1, S2 normal, no murmur, rub or gallop. Carotids 2+ without bruit.  Abdomen:   Soft, non-tender, non distended. Bowel sounds active all four quadrants,  no masses, no organomegaly.  Extremities: Extremities normal. (L)UE AVG palpable, no pulse or thrill.  Pulses: Good radial pulses bilat  Neurologic: Normal affect, no gross deficits.   No results found for this or any previous visit (from the past 48 hour(s)). No results found.  Assessment/Plan Clotted (L)UE AVG For thrombolysis, poss angioplasty, poss temp HD cath if necessary. Discussed procedure with pt. Consent signed in chart.  Brayton El PA-C 03/16/2012, 7:51 AM

## 2012-03-16 NOTE — Procedures (Signed)
Technically successful declot of left upper arm dialysis graft.  Procedure complicated by venous anastomotic rupture, treated with placement of overlapping covered stents.  Stents location and hematoma borders marked with a skin pen.

## 2012-04-09 ENCOUNTER — Other Ambulatory Visit: Payer: Self-pay | Admitting: Family Medicine

## 2012-04-29 ENCOUNTER — Ambulatory Visit: Payer: Medicare FFS | Admitting: Family Medicine

## 2012-05-06 ENCOUNTER — Ambulatory Visit (INDEPENDENT_AMBULATORY_CARE_PROVIDER_SITE_OTHER): Payer: Medicare FFS | Admitting: Family Medicine

## 2012-05-06 ENCOUNTER — Encounter: Payer: Self-pay | Admitting: Family Medicine

## 2012-05-06 VITALS — BP 126/57 | HR 88 | Temp 99.0°F | Ht 63.0 in | Wt 150.2 lb

## 2012-05-06 DIAGNOSIS — E785 Hyperlipidemia, unspecified: Secondary | ICD-10-CM

## 2012-05-06 DIAGNOSIS — Z23 Encounter for immunization: Secondary | ICD-10-CM

## 2012-05-06 DIAGNOSIS — N184 Chronic kidney disease, stage 4 (severe): Secondary | ICD-10-CM

## 2012-05-06 DIAGNOSIS — E7800 Pure hypercholesterolemia, unspecified: Secondary | ICD-10-CM

## 2012-05-06 DIAGNOSIS — I10 Essential (primary) hypertension: Secondary | ICD-10-CM

## 2012-05-06 LAB — POCT GLYCOSYLATED HEMOGLOBIN (HGB A1C): Hemoglobin A1C: 6.7

## 2012-05-06 NOTE — Patient Instructions (Addendum)
It has been a pleasure to see you today. Please take the medications as prescribed. I will refill them to your pharmacy.   Make an appointment for labs  I will call you with the labs results if they come back abnormal otherwise you will receive a letter Make a f/u appointment in 3 months.

## 2012-05-10 MED ORDER — RANITIDINE HCL 150 MG PO TABS: 150.0000 mg | ORAL_TABLET | Freq: Two times a day (BID) | ORAL | Status: DC

## 2012-05-10 MED ORDER — ATORVASTATIN CALCIUM 80 MG PO TABS: 80.0000 mg | ORAL_TABLET | Freq: Every day | ORAL | Status: DC

## 2012-05-10 MED ORDER — GLIPIZIDE 10 MG PO TABS: 20.0000 mg | ORAL_TABLET | Freq: Two times a day (BID) | ORAL | Status: DC

## 2012-05-10 MED ORDER — METOPROLOL TARTRATE 25 MG PO TABS: 25.0000 mg | ORAL_TABLET | ORAL | Status: DC

## 2012-05-10 MED ORDER — SITAGLIPTIN PHOSPHATE 25 MG PO TABS: 25.0000 mg | ORAL_TABLET | Freq: Every day | ORAL | Status: DC

## 2012-05-10 MED ORDER — AMLODIPINE BESYLATE 10 MG PO TABS: 10.0000 mg | ORAL_TABLET | Freq: Every day | ORAL | Status: DC

## 2012-05-10 NOTE — Assessment & Plan Note (Signed)
Controlled  Plan: Continue current regimen.

## 2012-05-10 NOTE — Assessment & Plan Note (Addendum)
Last LDL in March/12 on 95. Plan: Continue lipitor 80 for  now until we recheck lipid panel. Goal of this pt with her risk factor is LDL less than 70.  Will monitor closely side effects.

## 2012-05-10 NOTE — Progress Notes (Signed)
  Subjective:    Patient ID: Claudia Rich, female    DOB: 1927-06-08, 78 y.o.   MRN: [id number]  HPI This is my first time seen Claudia Rich. She comes today for f/u her chronic conditions and medication refill. 1. DM: Pt on Glipizide and Januvia. She denies symptoms of polyuria, polydipsia. No symptoms of hypoglycemia or other side effect of medications reported. 2. HTN: pt on metoprolol and amlodipine. No ACEI or due to pt renal condition. BP has been stable controlled around 120's /70's as pt states. Denies chest pain, palpitations, SOB, LE edema.  3. HLD: on Atorvastatin. Denies side effects of GI discomfort or muscle pain. 4. CKD stage VI: last Creatinine in our system in Feb to 5.40. Pt has been with vascular surgery to prepare arm AV graft for dialysis. Dialysis schedule: Tuesday, Thursday and  Saturday. She f/u with Dr. Renae Fickle and that her labs get drawn in his office.  Review of Systems Per HPI    Objective:   Physical Exam Gen:  NAD HEENT: Moist mucous membranes  CV: Regular rate and rhythm, no murmurs rubs or gallops PULM: Clear to auscultation bilaterally. No wheezes/rales/rhonchi ABD: Soft, non tender, non distended, normal bowel sounds EXT: No edema Neuro: Alert and oriented x3. No focalization      Assessment & Plan:

## 2012-05-10 NOTE — Assessment & Plan Note (Addendum)
AiC 6.7 today. At goal under 7.0. No hypoglycemic events or hyperglycemic symptoms Plan: Continue current regimen. Reordered Januvia and Glucotrol.

## 2012-05-11 ENCOUNTER — Telehealth: Payer: Self-pay | Admitting: Family Medicine

## 2012-05-11 NOTE — Telephone Encounter (Signed)
I will inform Ms. Claudia Rich about Ms. Claudia Rich's labs results as soon as I read the POA proper documentation.

## 2012-05-11 NOTE — Telephone Encounter (Signed)
Forwarded to pcp for advice.Claudia Rich  

## 2012-05-11 NOTE — Telephone Encounter (Signed)
Ms. Claudia Rich calling to inquire first about lab results and to ask that all correspondence regarding Ms. Claudia Rich be directed to her as Ms. Claudia Rich is unable to retain information and have memory problems since a stroke last March.  Ms. Claudia Rich is her POA and is faxing copy of this for your review and to be scanned into her chart.  Will put copy in your box.  Please place in scan box when done.

## 2012-05-20 ENCOUNTER — Telehealth: Payer: Self-pay | Admitting: *Deleted

## 2012-05-20 NOTE — Telephone Encounter (Signed)
Ms. Claudia Rich for pt called today inquiring about the results of lab work. I informed her that the only labs that were done on Ms. Claudia Rich was an A1C. And that her other labs were done as future orders which means that she will need to come in and have those drawn. appt made for her to come in on 9.30.2013 @ 1030 AM. She is asking that once the results come back to please fax them to:   [facility] (dialysis) [phone] Attn: Silvio Pate. And to Ms. Claudia Rich [phone].[name], Viann Shove

## 2012-05-23 ENCOUNTER — Ambulatory Visit (INDEPENDENT_AMBULATORY_CARE_PROVIDER_SITE_OTHER): Payer: Medicare FFS | Admitting: Cardiovascular Disease

## 2012-05-23 ENCOUNTER — Encounter: Payer: Self-pay | Admitting: Cardiovascular Disease

## 2012-05-23 ENCOUNTER — Other Ambulatory Visit: Payer: Medicare FFS

## 2012-05-23 VITALS — BP 159/76 | HR 83 | Wt 153.0 lb

## 2012-05-23 DIAGNOSIS — N186 End stage renal disease: Secondary | ICD-10-CM

## 2012-05-23 DIAGNOSIS — I10 Essential (primary) hypertension: Secondary | ICD-10-CM

## 2012-05-23 DIAGNOSIS — E7800 Pure hypercholesterolemia, unspecified: Secondary | ICD-10-CM

## 2012-05-23 DIAGNOSIS — I2510 Atherosclerotic heart disease of native coronary artery without angina pectoris: Secondary | ICD-10-CM

## 2012-05-23 DIAGNOSIS — E785 Hyperlipidemia, unspecified: Secondary | ICD-10-CM

## 2012-05-23 DIAGNOSIS — R079 Chest pain, unspecified: Secondary | ICD-10-CM

## 2012-05-23 DIAGNOSIS — R42 Dizziness and giddiness: Secondary | ICD-10-CM

## 2012-05-23 LAB — BASIC METABOLIC PANEL
BUN: 38 mg/dL — ABNORMAL HIGH (ref 6–23)
CO2: 30 mEq/L (ref 19–32)
Chloride: 99 mEq/L (ref 96–112)
Creat: 7.4 mg/dL — ABNORMAL HIGH (ref 0.50–1.10)
Glucose, Bld: 161 mg/dL — ABNORMAL HIGH (ref 70–99)

## 2012-05-23 LAB — LIPID PANEL
Cholesterol: 169 mg/dL (ref 0–200)
Triglycerides: 89 mg/dL (ref <150)

## 2012-05-23 NOTE — Assessment & Plan Note (Signed)
Still on beta blocker and calcium blocker.  Pending repeat evaluation of EF may need to limit these if BP dropping at dialysis.

## 2012-05-23 NOTE — Progress Notes (Signed)
BMP AND FLP DONE TODAY Karigan Cloninger 

## 2012-05-23 NOTE — Assessment & Plan Note (Signed)
Cholesterol is at goal.  Continue current dose of statin and diet Rx.  No myalgias or side effects.  F/U  LFT's in 6 months. Lab Results  Component Value Date   LDLCALC  Value: 95        Total Cholesterol/HDL:CHD Risk Coronary Heart Disease Risk Table                     Men   Women  1/2 Average Risk   3.4   3.3  Average Risk       5.0   4.4  2 X Average Risk   9.6   7.1  3 X Average Risk  23.4   11.0        Use the calculated Patient Ratio above and the CHD Risk Table to determine the patient's CHD Risk.        ATP III CLASSIFICATION (LDL):  <100     mg/dL   Optimal  409-811  mg/dL   Near or Above                    Optimal  130-159  mg/dL   Borderline  914-782  mg/dL   High  >956     mg/dL   Very High 10/07/863

## 2012-05-23 NOTE — Assessment & Plan Note (Signed)
Chest pressure on dialysis and hospitalization 3/13 with troponin elevation ? Decreased EF by echo F/U lexiscan myovue

## 2012-05-23 NOTE — Progress Notes (Signed)
Patient ID: Claudia Rich, female   DOB: Apr 27, 1927, 78 y.o.   MRN: [id number] 78 yo with CRF on dialysis over a year.  Lots of issues with access with two fistulas and multiple catheters placed.  Referred by Dr Claudia Rich and [facility] as she has low BP and ? Chest pain with dialysis.  Unable to draw enough fluid off at times.  Poor historian.  Some of her history from niece Claudia Rich [phone] who I spoke with on the phone.  3/12 admitted with ? TIA  Nothing acute on CT.  Meds adjusted Troponin positive but no w/u and cards not consulted  Interestingly echo ordered for TIA and showed EF 20-25% but never addressed on D/C summary  Patient denies history of CHF.  At dialysis gets weak and chest pressure when BP drops.  As far as I can tell has not had trial of midodrine or florinef Denies history of CAD Niece indicates they thought she had an MI with TIA but this was likely just the elevated troponin   ROS: Denies fever, malais, weight loss, blurry vision, decreased visual acuity, cough, sputum, SOB, hemoptysis, pleuritic pain, palpitaitons, heartburn, abdominal pain, melena, lower extremity edema, claudication, or rash.  All other systems reviewed and negative   General: Affect appropriate Frail black female HEENT: normal Neck supple with no adenopathy JVP normal no bruits no thyromegaly Lungs clear with no wheezing and good diaphragmatic motion Heart:  S1/S2 no murmur,rub, gallop or click PMI enlarged Abdomen: benighn, BS positve, no tenderness, no AAA no bruit.  No HSM or HJR Distal pulses intact with no bruits No edema Neuro non-focal Skin warm and dry No muscular weakness Old fistula RUE Functional LUE fistula with thrill Multiple scars in subclavian area and neck form catheters  Medications Current Outpatient Prescriptions  Medication Sig Dispense Refill  . amLODipine (NORVASC) 10 MG tablet Take 1 tablet (10 mg total) by mouth daily.  30 tablet  6  . aspirin 81 MG chewable tablet  Chew 81 mg by mouth daily.      Marland Kitchen atorvastatin (LIPITOR) 80 MG tablet Take 1 tablet (80 mg total) by mouth daily.  30 tablet  6  . B Complex-C-Folic Acid (RENAL MULTIVITAMIN FORMULA PO) Take 10 mg by mouth daily.       . calcium acetate (PHOSLO) 667 MG capsule Take 667 mg by mouth 2 (two) times daily.        Marland Kitchen glipiZIDE (GLUCOTROL) 10 MG tablet Take 2 tablets (20 mg total) by mouth 2 (two) times daily before a meal.  120 tablet  6  . metoprolol tartrate (LOPRESSOR) 25 MG tablet Take 1 tablet (25 mg total) by mouth See admin instructions. Take 1 tablet twice daily on non-dialysis days. Take 1 tablet in the evening on Tuesday, Thursday and Saturday.  60 tablet  6  . ondansetron (ZOFRAN) 4 MG tablet Take 4 mg by mouth every 8 (eight) hours as needed. For nausea.      . ranitidine (ZANTAC) 150 MG tablet Take 1 tablet (150 mg total) by mouth 2 (two) times daily.  60 tablet  6  . sitaGLIPtin (JANUVIA) 25 MG tablet Take 1 tablet (25 mg total) by mouth daily.  30 tablet  6  . VENTOLIN HFA 108 (90 BASE) MCG/ACT inhaler Inhale 2 puffs into the lungs every 6 (six) hours as needed. For shortness of breath.        Allergies Aspirin; Codeine sulfate; and Meperidine hcl  Family History:  Family History  Problem Relation Age of Onset  . Anesthesia problems Neg Hx     Social History: History   Social History  . Marital Status: Widowed    Spouse Name: N/A    Number of Children: N/A  . Years of Education: N/A   Occupational History  . Not on file.   Social History Main Topics  . Smoking status: Never Smoker   . Smokeless tobacco: Never Used  . Alcohol Use: No  . Drug Use: No  . Sexually Active: Not on file   Other Topics Concern  . Not on file   Social History Narrative  . No narrative on file    Electrocardiogram:  NSR rate 78 normal ECG done 10/02/11  Assessment and Plan

## 2012-05-23 NOTE — Patient Instructions (Signed)
Your physician recommends that you schedule a follow-up appointment in:  AS NEEDED Your physician recommends that you continue on your current medications as directed. Please refer to the Current Medication list given to you today.  Your physician has requested that you have a lexiscan myoview. For further information please visit [email or web address] Please follow instruction sheet, as given.  DX CHEST PAIN  Your physician has requested that you have an echocardiogram. Echocardiography is a painless test that uses sound waves to create images of your heart. It provides your doctor with information about the size and shape of your heart and how well your heart's chambers and valves are working. This procedure takes approximately one hour. There are no restrictions for this procedure. DX DIZZINESS

## 2012-05-23 NOTE — Assessment & Plan Note (Signed)
Not clear what is going on with dialysis.  Will need to speak with nephrology She describes nausea and vomiting not just low BP.  She has an echo showing low EF with some pressure in chest with low BP.  Will need lexiscan and f/u echo to further assess heart.  Unfortunately I don't think there is much from our perspective that can be done to make dialysis better tolerated.  Will advize on midodrine or other w/u after testing is done.

## 2012-05-25 ENCOUNTER — Ambulatory Visit (HOSPITAL_COMMUNITY): Payer: Medicare FFS | Attending: Cardiovascular Disease

## 2012-05-25 DIAGNOSIS — I379 Nonrheumatic pulmonary valve disorder, unspecified: Secondary | ICD-10-CM | POA: Insufficient documentation

## 2012-05-25 DIAGNOSIS — I059 Rheumatic mitral valve disease, unspecified: Secondary | ICD-10-CM | POA: Insufficient documentation

## 2012-05-25 DIAGNOSIS — I2510 Atherosclerotic heart disease of native coronary artery without angina pectoris: Secondary | ICD-10-CM | POA: Insufficient documentation

## 2012-05-25 DIAGNOSIS — E119 Type 2 diabetes mellitus without complications: Secondary | ICD-10-CM | POA: Insufficient documentation

## 2012-05-25 DIAGNOSIS — R42 Dizziness and giddiness: Secondary | ICD-10-CM | POA: Insufficient documentation

## 2012-05-25 DIAGNOSIS — N186 End stage renal disease: Secondary | ICD-10-CM | POA: Insufficient documentation

## 2012-05-25 DIAGNOSIS — R072 Precordial pain: Secondary | ICD-10-CM | POA: Insufficient documentation

## 2012-05-25 DIAGNOSIS — I369 Nonrheumatic tricuspid valve disorder, unspecified: Secondary | ICD-10-CM | POA: Insufficient documentation

## 2012-05-25 NOTE — Progress Notes (Signed)
Echocardiogram performed.  

## 2012-05-30 ENCOUNTER — Ambulatory Visit (HOSPITAL_COMMUNITY): Payer: Medicare FFS | Attending: Cardiology | Admitting: Radiology

## 2012-05-30 VITALS — BP 148/62 | Ht 63.0 in | Wt 151.0 lb

## 2012-05-30 DIAGNOSIS — R112 Nausea with vomiting, unspecified: Secondary | ICD-10-CM | POA: Insufficient documentation

## 2012-05-30 DIAGNOSIS — R0789 Other chest pain: Secondary | ICD-10-CM | POA: Insufficient documentation

## 2012-05-30 DIAGNOSIS — E119 Type 2 diabetes mellitus without complications: Secondary | ICD-10-CM | POA: Insufficient documentation

## 2012-05-30 DIAGNOSIS — N186 End stage renal disease: Secondary | ICD-10-CM | POA: Insufficient documentation

## 2012-05-30 DIAGNOSIS — R079 Chest pain, unspecified: Secondary | ICD-10-CM

## 2012-05-30 DIAGNOSIS — I120 Hypertensive chronic kidney disease with stage 5 chronic kidney disease or end stage renal disease: Secondary | ICD-10-CM | POA: Insufficient documentation

## 2012-05-30 DIAGNOSIS — R55 Syncope and collapse: Secondary | ICD-10-CM | POA: Insufficient documentation

## 2012-05-30 MED ORDER — TECHNETIUM TC 99M SESTAMIBI GENERIC - CARDIOLITE
33.0000 | Freq: Once | INTRAVENOUS | Status: AC | PRN
  Administered 2012-05-30: 33 via INTRAVENOUS

## 2012-05-30 MED ORDER — TECHNETIUM TC 99M SESTAMIBI GENERIC - CARDIOLITE
11.0000 | Freq: Once | INTRAVENOUS | Status: AC | PRN
  Administered 2012-05-30: 11 via INTRAVENOUS

## 2012-05-30 MED ORDER — REGADENOSON 0.4 MG/5ML IV SOLN
0.4000 mg | Freq: Once | INTRAVENOUS | Status: AC
  Administered 2012-05-30: 0.4 mg via INTRAVENOUS

## 2012-05-30 NOTE — Progress Notes (Signed)
[facility] SITE 3 NUCLEAR MED [id number] [place] [address] [phone]  Cardiology Nuclear Med Study  Claudia Rich is a 78 y.o. female     MRN : [id number]     DOB: 11-12-1926  Procedure Date: 05/30/2012  Nuclear Med Background Indication for Stress Test:  Evaluation for Ischemia History:  ESRD/Dialysis ECHO: Decreased EF Cardiac Risk Factors: CVA, Hypertension, Lipids, NIDDM and TIA  Symptoms:  Chest Pressure, Nausea, Syncope and Vomiting   Nuclear Pre-Procedure Caffeine/Decaff Intake:  8:00pm NPO After: 12:00am   Lungs:  clear O2 Sat: 94% on room air. IV 0.9% NS with Angio Cath:  22g  IV Site: R Hand  IV Started by:  Cathlyn Parsons, RN  Chest Size (in):  40 Cup Size: C  Height: 5\' 3"  (1.6 m)  Weight:  151 lb (68.493 kg)  BMI:  Body mass index is 26.75 kg/(m^2). Tech Comments:  Lopressor held x 36 hrs    Nuclear Med Study 1 or 2 day study: 1 day  Stress Test Type:  Lexiscan  Reading MD: Willa Rough, MD  Order Authorizing Provider:  Burna Cash  Resting Radionuclide: Technetium 70m Sestamibi  Resting Radionuclide Dose: 11.0 mCi   Stress Radionuclide:  Technetium 30m Sestamibi  Stress Radionuclide Dose: 32.9 mCi           Stress Protocol Rest HR: 81 Stress HR: 96  Rest BP: 148/62 Stress BP: 146/51  Exercise Time (min): n/a METS: n/a   Predicted Max HR: 137 bpm % Max HR: 70.07 bpm Rate Pressure Product: 62952   Dose of Adenosine (mg):  n/a Dose of Lexiscan: 0.4 mg  Dose of Atropine (mg): n/a Dose of Dobutamine: n/a mcg/kg/min (at max HR)  Stress Test Technologist: Milana Na, EMT-P  Nuclear Technologist:  Domenic Polite, CNMT     Rest Procedure:  Myocardial perfusion imaging was performed at rest 45 minutes following the intravenous administration of Technetium 61m Sestamibi. Rest ECG: NSR - Normal EKG  Stress Procedure:  The patient received IV Lexiscan 0.4 mg over 15-seconds.  Technetium 16m Sestamibi  injected at 30-seconds.  There were no significant changes and fatigue with Lexiscan.  Quantitative spect images were obtained after a 45 minute delay. Stress ECG: No significant change from baseline ECG  QPS Raw Data Images:  Patient motion noted; appropriate software correction applied. Stress Images:  Normal homogeneous uptake in all areas of the myocardium. Rest Images:  Normal homogeneous uptake in all areas of the myocardium. Subtraction (SDS):  No evidence of ischemia. Transient Ischemic Dilatation (Normal <1.22):  0.93 Lung/Heart Ratio (Normal <0.45):  0.29  Quantitative Gated Spect Images QGS EDV:  61 ml QGS ESV:  15 ml  Impression Exercise Capacity:  Lexiscan with no exercise. BP Response:  Normal blood pressure response. Clinical Symptoms:  Fatigue ECG Impression:  No significant ST segment change suggestive of ischemia. Comparison with Prior Nuclear Study: No images to compare  Overall Impression:  Normal stress nuclear study.  LV Ejection Fraction: 75%.  LV Wall Motion:  Normal Wall Motion.  Willa Rough, MD

## 2012-06-02 ENCOUNTER — Encounter: Payer: Self-pay | Admitting: *Deleted

## 2012-06-06 ENCOUNTER — Telehealth: Payer: Self-pay | Admitting: Cardiovascular Disease

## 2012-06-06 NOTE — Telephone Encounter (Signed)
patient  Niece Claudia Rich MPOA [phone] returning nurse call

## 2012-06-06 NOTE — Telephone Encounter (Signed)
LMTCB ./CY 

## 2012-06-06 NOTE — Telephone Encounter (Signed)
PT'S POA  [place]  AWARE OF ECHO AND MYOVIEW RESULTS./CY

## 2012-06-25 ENCOUNTER — Observation Stay (HOSPITAL_COMMUNITY)
Admission: EM | Admit: 2012-06-25 | Discharge: 2012-07-01 | Disposition: A | Discharge status: Home / Self Care | Payer: Medicare HMO | Attending: Internal Medicine | Admitting: Internal Medicine

## 2012-06-25 ENCOUNTER — Encounter (HOSPITAL_COMMUNITY): Payer: Self-pay | Admitting: *Deleted

## 2012-06-25 ENCOUNTER — Emergency Department (HOSPITAL_COMMUNITY): Payer: Medicare HMO

## 2012-06-25 DIAGNOSIS — I252 Old myocardial infarction: Secondary | ICD-10-CM | POA: Insufficient documentation

## 2012-06-25 DIAGNOSIS — J939 Pneumothorax, unspecified: Secondary | ICD-10-CM

## 2012-06-25 DIAGNOSIS — Y849 Medical procedure, unspecified as the cause of abnormal reaction of the patient, or of later complication, without mention of misadventure at the time of the procedure: Secondary | ICD-10-CM | POA: Insufficient documentation

## 2012-06-25 DIAGNOSIS — S2239XA Fracture of one rib, unspecified side, initial encounter for closed fracture: Secondary | ICD-10-CM

## 2012-06-25 DIAGNOSIS — Z8673 Personal history of transient ischemic attack (TIA), and cerebral infarction without residual deficits: Secondary | ICD-10-CM | POA: Insufficient documentation

## 2012-06-25 DIAGNOSIS — K219 Gastro-esophageal reflux disease without esophagitis: Secondary | ICD-10-CM | POA: Insufficient documentation

## 2012-06-25 DIAGNOSIS — IMO0001 Reserved for inherently not codable concepts without codable children: Secondary | ICD-10-CM

## 2012-06-25 DIAGNOSIS — Y92009 Unspecified place in unspecified non-institutional (private) residence as the place of occurrence of the external cause: Secondary | ICD-10-CM | POA: Insufficient documentation

## 2012-06-25 DIAGNOSIS — I10 Essential (primary) hypertension: Secondary | ICD-10-CM

## 2012-06-25 DIAGNOSIS — Z992 Dependence on renal dialysis: Secondary | ICD-10-CM | POA: Insufficient documentation

## 2012-06-25 DIAGNOSIS — S270XXA Traumatic pneumothorax, initial encounter: Principal | ICD-10-CM

## 2012-06-25 DIAGNOSIS — I517 Cardiomegaly: Secondary | ICD-10-CM | POA: Insufficient documentation

## 2012-06-25 DIAGNOSIS — I120 Hypertensive chronic kidney disease with stage 5 chronic kidney disease or end stage renal disease: Secondary | ICD-10-CM | POA: Insufficient documentation

## 2012-06-25 DIAGNOSIS — S2249XA Multiple fractures of ribs, unspecified side, initial encounter for closed fracture: Secondary | ICD-10-CM

## 2012-06-25 DIAGNOSIS — N184 Chronic kidney disease, stage 4 (severe): Secondary | ICD-10-CM

## 2012-06-25 DIAGNOSIS — E119 Type 2 diabetes mellitus without complications: Secondary | ICD-10-CM | POA: Insufficient documentation

## 2012-06-25 DIAGNOSIS — K862 Cyst of pancreas: Secondary | ICD-10-CM | POA: Insufficient documentation

## 2012-06-25 DIAGNOSIS — W010XXA Fall on same level from slipping, tripping and stumbling without subsequent striking against object, initial encounter: Secondary | ICD-10-CM | POA: Insufficient documentation

## 2012-06-25 DIAGNOSIS — T82898A Other specified complication of vascular prosthetic devices, implants and grafts, initial encounter: Secondary | ICD-10-CM | POA: Insufficient documentation

## 2012-06-25 DIAGNOSIS — E876 Hypokalemia: Secondary | ICD-10-CM | POA: Diagnosis present

## 2012-06-25 DIAGNOSIS — D539 Nutritional anemia, unspecified: Secondary | ICD-10-CM | POA: Diagnosis present

## 2012-06-25 DIAGNOSIS — J4489 Other specified chronic obstructive pulmonary disease: Secondary | ICD-10-CM | POA: Insufficient documentation

## 2012-06-25 DIAGNOSIS — D649 Anemia, unspecified: Secondary | ICD-10-CM

## 2012-06-25 DIAGNOSIS — J449 Chronic obstructive pulmonary disease, unspecified: Secondary | ICD-10-CM | POA: Insufficient documentation

## 2012-06-25 DIAGNOSIS — W19XXXA Unspecified fall, initial encounter: Secondary | ICD-10-CM

## 2012-06-25 DIAGNOSIS — I2510 Atherosclerotic heart disease of native coronary artery without angina pectoris: Secondary | ICD-10-CM

## 2012-06-25 DIAGNOSIS — S2232XA Fracture of one rib, left side, initial encounter for closed fracture: Secondary | ICD-10-CM

## 2012-06-25 DIAGNOSIS — N186 End stage renal disease: Secondary | ICD-10-CM

## 2012-06-25 HISTORY — DX: Dependence on renal dialysis: Z99.2

## 2012-06-25 HISTORY — DX: Dependence on renal dialysis: N18.6

## 2012-06-25 LAB — CBC WITH DIFFERENTIAL/PLATELET
Basophils Absolute: 0 10*3/uL (ref 0.0–0.1)
Basophils Relative: 0 % (ref 0–1)
Eosinophils Absolute: 0 10*3/uL (ref 0.0–0.7)
MCH: 32.1 pg (ref 26.0–34.0)
MCHC: 31.7 g/dL (ref 30.0–36.0)
Monocytes Relative: 7 % (ref 3–12)
Neutro Abs: 9.3 10*3/uL — ABNORMAL HIGH (ref 1.7–7.7)
Neutrophils Relative %: 90 % — ABNORMAL HIGH (ref 43–77)
Platelets: 100 10*3/uL — ABNORMAL LOW (ref 150–400)
RDW: 13.8 % (ref 11.5–15.5)

## 2012-06-25 LAB — APTT: aPTT: 34 seconds (ref 24–37)

## 2012-06-25 LAB — PROTIME-INR: Prothrombin Time: 17.5 seconds — ABNORMAL HIGH (ref 11.6–15.2)

## 2012-06-25 MED ORDER — MORPHINE SULFATE 4 MG/ML IJ SOLN
4.0000 mg | Freq: Once | INTRAMUSCULAR | Status: AC
  Administered 2012-06-26: 4 mg via INTRAVENOUS
  Filled 2012-06-25: qty 1

## 2012-06-25 MED ORDER — SODIUM CHLORIDE 0.9 % IV BOLUS (SEPSIS)
1000.0000 mL | Freq: Once | INTRAVENOUS | Status: AC
  Administered 2012-06-25: 1000 mL via INTRAVENOUS

## 2012-06-25 MED ORDER — IOHEXOL 300 MG/ML  SOLN
100.0000 mL | Freq: Once | INTRAMUSCULAR | Status: AC | PRN
  Administered 2012-06-25: 100 mL via INTRAVENOUS

## 2012-06-25 MED ORDER — IOHEXOL 300 MG/ML  SOLN
20.0000 mL | INTRAMUSCULAR | Status: AC
  Administered 2012-06-25: 20 mL via ORAL

## 2012-06-25 MED ORDER — MORPHINE SULFATE 4 MG/ML IJ SOLN
4.0000 mg | Freq: Once | INTRAMUSCULAR | Status: AC
  Administered 2012-06-25: 4 mg via INTRAVENOUS
  Filled 2012-06-25: qty 1

## 2012-06-25 NOTE — H&P (Signed)
PCP:   Lillia Abed, MD   Chief Complaint:  fell  HPI: 78 yo female h/o esrd got up to go to bathroom tonight and fell and got weak and fell against the sink and hit left side of her chest.  Comes in with left chest pain with deep inspiration or cough.  Normal state of health prior to this.  Had dialysis today and says she did well.  Denies loc but pt is really not sure.  Denies any fever/n/v/d.  No focal neuro def right now.  Found to have mult rib fracture and small pnx.  Seen by trauma surgery already.  Also had markedly abnormal labs with v low k this has been repeated which is much different from first labs drawn???  Review of Systems:  O/w neg  Past Medical History: Past Medical History  Diagnosis Date  . Hypertension   . Diabetes mellitus   . Hyperlipidemia   . GERD (gastroesophageal reflux disease)   . Glaucoma   . Hammer toe   . Internal hemorrhoids   . Diverticulosis   . Colon polyps   . Peripheral vascular disease   . Anemia   . COPD (chronic obstructive pulmonary disease)   . Stroke 10/2010  . Myocardial infarction 10/2010  . Chronic kidney disease     Tues, Thurs, Sat @ [address]    Past Surgical History  Procedure Date  . Av fistula repair 02/16/2011    Revision of left brachiocephalic arteriovenous fistula with patch angioplasty of left brachiocephalic fistula  . Av fistula placement, brachiocephalic 09/24/2010    Left  . Av fistula placement   . Cataract extraction   . Abdominal hysterectomy   . Av fistula placement 08/14/2011    Procedure: ARTERIOVENOUS (AV) FISTULA CREATION;  Surgeon: Chuck Hint, MD;  Location: [facility] OR;  Service: Vascular;  Laterality: Right;  Right Arm Arteriovenous Fistula Creation/ contact pt's niece,[name] for any information to be relayed to pt: # [phone]  . Insertion of dialysis catheter 09/28/2011    Procedure: INSERTION OF DIALYSIS CATHETER;  Surgeon: Sherren Kerns, MD;  Location: [facility] OR;  Service:  Vascular;  Laterality: Left;  Insertion of Dialysis Catheter left IJ  . Eye surgery   . Av fistula placement 12/07/2011    Procedure: INSERTION OF ARTERIOVENOUS (AV) GORE-TEX GRAFT ARM;  Surgeon: Sherren Kerns, MD;  Location: [facility] OR;  Service: Vascular;  Laterality: Left;    Medications: Prior to Admission medications   Medication Sig Start Date End Date Taking? Authorizing Provider  albuterol (PROVENTIL HFA;VENTOLIN HFA) 108 (90 BASE) MCG/ACT inhaler Inhale 2 puffs into the lungs every 6 (six) hours as needed. Shortness of breath   Yes Historical Provider, MD  amLODipine (NORVASC) 10 MG tablet Take 1 tablet (10 mg total) by mouth daily. 05/10/12  Yes Dayarmys [place] de Criselda Peaches, MD  aspirin 81 MG chewable tablet Chew 81 mg by mouth daily.   Yes Historical Provider, MD  atorvastatin (LIPITOR) 80 MG tablet Take 1 tablet (80 mg total) by mouth daily. 05/10/12  Yes Dayarmys [place]da Peaches, MD  B Complex-C-Folic Acid (RENAL MULTIVITAMIN FORMULA PO) Take 1 tablet by mouth daily.    Yes Historical Provider, MD  calcium acetate (PHOSLO) 667 MG capsule Take 667 mg by mouth 2 (two) times daily.     Yes Historical Provider, MD  glipiZIDE (GLUCOTROL) 10 MG tablet Take 2 tablets (20 mg total) by mouth 2 (two) times daily before a meal. 05/10/12  Yes Dayarmys [place]da Peaches, MD  metoprolol tartrate (LOPRESSOR) 25 MG tablet Take 1 tablet (25 mg total) by mouth See admin instructions. Take 1 tablet twice daily on non-dialysis days. Take 1 tablet in the evening on Tuesday, Thursday and Saturday. 05/10/12  Yes Dayarmys [place] Criselda Peaches, MD  ondansetron (ZOFRAN) 4 MG tablet Take 4 mg by mouth every 8 (eight) hours as needed. For nausea.   Yes Historical Provider, MD  ranitidine (ZANTAC) 150 MG tablet Take 1 tablet (150 mg total) by mouth 2 (two) times daily. 05/10/12  Yes Dayarmys [place]da Peaches, MD  sitaGLIPtin (JANUVIA) 25 MG tablet Take 1 tablet (25 mg total) by mouth daily. 05/10/12  Yes Dayarmys [place] Cris[place]da Peaches, MD    Allergies:   Allergies  Allergen Reactions  . Aspirin     Upset stomach  . Codeine Sulfate Other (See Comments)    unknown  . Meperidine Hcl     REACTION: vomiting    Social History:  reports that she has never smoked. She has never used smokeless tobacco. She reports that she does not drink alcohol or use illicit drugs.   Family History: Family History  Problem Relation Age of Onset  . Anesthesia problems Neg Hx     Physical Exam: Filed Vitals:   06/25/12 2200 06/25/12 2215 06/25/12 2230 06/25/12 2245  BP: 163/78 153/74 139/76 157/75  Pulse: 83 82 79 78  Temp:      TempSrc:      Resp: 15     SpO2: 96% 97% 95% 95%   General appearance: alert, cooperative and no distress Lungs: clear to auscultation bilaterally left chest wall very sore and painful to palpation chest rise with breathing is normal Heart: regular rate and rhythm, S1, S2 normal, no murmur, click, rub or gallop Abdomen: soft, non-tender; bowel sounds normal; no masses,  no organomegaly Extremities: extremities normal, atraumatic, no cyanosis or edema Pulses: 2+ and symmetric Skin: Skin color, texture, turgor normal. No rashes or lesions Neurologic: Grossly normal    Labs on Admission:   [facility] 06/25/12 2140  NA 143  K <2.0*  CL 115*  CO2 19  GLUCOSE 177*  BUN 9  CREATININE 2.27*  CALCIUM 5.1*  MG --  PHOS --    Basename 06/25/12 2140  WBC 10.4  NEUTROABS 9.3*  HGB 8.5*  HCT 26.8*  MCV 101.1*  PLT 100*    Radiological Exams on Admission: Ct Chest W Contrast  06/25/2012  *RADIOLOGY REPORT*  Clinical Data:  The patient fell today and complains of left rib pain.  Left upper quadrant pain.  CT CHEST, ABDOMEN AND PELVIS WITH CONTRAST  Technique:  Multidetector CT imaging of the chest, abdomen and pelvis was performed following the standard protocol during bolus administration of intravenous contrast.  Contrast: OMNIPAQUE IOHEXOL 300 MG/ML  SOLN  Comparison:   None.  CT  CHEST  Findings:  Mild cardiac enlargement.  Coronary artery calcification.  Normal caliber thoracic aorta with calcification. No significant mediastinal lymphadenopathy.  The esophagus is decompressed.  Small esophageal hiatal hernia.  There is a small left pleural effusion with small left pneumothorax.  There is a fibrosis or atelectasis in the lung bases with bronchial wall thickening suggesting chronic bronchitis. Airways appear patent.  Normal alignment of the thoracic spine without vertebral compression deformity.  The sternum appears intact.  Comminuted fractures of the left posterior and lateral seventh and eighth ribs with fractures of multiple locations and  depression of fractures of the seventh rib posteriorly.  IMPRESSION: Comminuted and displaced fractures of the left seventh and eighth ribs with small left pleural effusion and small left pneumothorax. Consolidation or atelectasis in the lung bases with chronic bronchitic changes.  CT ABDOMEN AND PELVIS  Findings:  Normal alignment of the lumbar vertebrae without compression deformity.  Degenerative changes in the lumbar spine with degenerative disc disease.  The sacrum, pelvis, and hips appear intact.  The liver, spleen, gallbladder, adrenal glands, and retroperitoneal lymph nodes are unremarkable.  There is a circumscribed appearing cystic lesion in the tail of the pancreas measuring about 1.6 cm diameter.  This could represent a simple cyst or cystic neoplasm. 46-month follow-up study is recommended.  Small accessory spleen. The kidneys are atrophic without evidence of solid mass or hydronephrosis.  Calcification of the aorta without aneurysm.  The stomach, small bowel, and colon are not abnormally distended and there is no appreciable wall thickening.  No free air or free fluid in the abdomen.  No mesenteric or retroperitoneal fluid collections.  Pelvis:  The bladder wall is not thickened.  The uterus appears to be surgically absent.  No abnormal  adnexal masses.  No free or loculated pelvic fluid collections.  No evidence of diverticulitis. The appendix is not identified.  Abdominal wall musculature appears intact.  IMPRESSION: No acute post-traumatic changes demonstrated in the abdomen or pelvis.  No evidence of solid organ injury or bowel perforation. There is incidental note of a 1.6 cm diameter cystic lesion in the tail of the pancreas.  71-month follow-up is recommended.  Results were discussed by telephone at 2157 hours on 06/25/2012 with Dr. Ambrose Mantle.   Original Report Authenticated By: Burman Nieves, M.D.    Ct Abdomen Pelvis W Contrast  06/25/2012  *RADIOLOGY REPORT*  Clinical Data:  The patient fell today and complains of left rib pain.  Left upper quadrant pain.  CT CHEST, ABDOMEN AND PELVIS WITH CONTRAST  Technique:  Multidetector CT imaging of the chest, abdomen and pelvis was performed following the standard protocol during bolus administration of intravenous contrast.  Contrast: OMNIPAQUE IOHEXOL 300 MG/ML  SOLN  Comparison:   None.  CT CHEST  Findings:  Mild cardiac enlargement.  Coronary artery calcification.  Normal caliber thoracic aorta with calcification. No significant mediastinal lymphadenopathy.  The esophagus is decompressed.  Small esophageal hiatal hernia.  There is a small left pleural effusion with small left pneumothorax.  There is a fibrosis or atelectasis in the lung bases with bronchial wall thickening suggesting chronic bronchitis. Airways appear patent.  Normal alignment of the thoracic spine without vertebral compression deformity.  The sternum appears intact.  Comminuted fractures of the left posterior and lateral seventh and eighth ribs with fractures of multiple locations and depression of fractures of the seventh rib posteriorly.  IMPRESSION: Comminuted and displaced fractures of the left seventh and eighth ribs with small left pleural effusion and small left pneumothorax. Consolidation or atelectasis in the  lung bases with chronic bronchitic changes.  CT ABDOMEN AND PELVIS  Findings:  Normal alignment of the lumbar vertebrae without compression deformity.  Degenerative changes in the lumbar spine with degenerative disc disease.  The sacrum, pelvis, and hips appear intact.  The liver, spleen, gallbladder, adrenal glands, and retroperitoneal lymph nodes are unremarkable.  There is a circumscribed appearing cystic lesion in the tail of the pancreas measuring about 1.6 cm diameter.  This could represent a simple cyst or cystic neoplasm. 62-month follow-up study is recommended.  Small accessory spleen. The kidneys are atrophic without evidence of solid mass or hydronephrosis.  Calcification of the aorta without aneurysm.  The stomach, small bowel, and colon are not abnormally distended and there is no appreciable wall thickening.  No free air or free fluid in the abdomen.  No mesenteric or retroperitoneal fluid collections.  Pelvis:  The bladder wall is not thickened.  The uterus appears to be surgically absent.  No abnormal adnexal masses.  No free or loculated pelvic fluid collections.  No evidence of diverticulitis. The appendix is not identified.  Abdominal wall musculature appears intact.  IMPRESSION: No acute post-traumatic changes demonstrated in the abdomen or pelvis.  No evidence of solid organ injury or bowel perforation. There is incidental note of a 1.6 cm diameter cystic lesion in the tail of the pancreas.  14-month follow-up is recommended.  Results were discussed by telephone at 2157 hours on 06/25/2012 with Dr. Ambrose Mantle.   Original Report Authenticated By: Burman Nieves, M.D.    Dg Chest Portable 1 View  06/25/2012  *RADIOLOGY REPORT*  Clinical Data: Fall.  Left rib pain.  PORTABLE CHEST - 1 VIEW  Comparison: 09/28/2011  Findings: Multiple left-sided rib fractures.  This involves ribs six through eight.  There appear to be fractures both posteriorly and laterally of ribs seven and eight.  There is question  of a small left apical pneumothorax.  Cardiomegaly.  Bibasilar atelectasis and small left pleural effusion.  IMPRESSION: Multiple left-sided rib fractures as above.  Small associated left pleural effusion.  Suspect tiny left apical pneumothorax.  Cardiomegaly.   Original Report Authenticated By: Charlett Nose, M.D.     Assessment/Plan Present on Admission:  78 yo female with fall and mult left rib fractures with small pnx .Closed rib fracture .Pneumothorax, traumatic .End stage renal disease .CAD (coronary artery disease) .Hypokalemia .ANEMIA, NORMOCYTIC  Supportive care for pnx.  Oxygen.  Pain meds.  Trauma surg did not recommend chest tube at this time.  Monitor on tele.  Repeat labs show k 3.3 not less than 2.  Will repeat these in am.  Esrd.  If pt still here tues, needs dialysis.  Probably got orthostatic tonight which caused fall??  Ck orthostatics.  bp fine now.  No neuro deficitis.  Ck neuro cks overnight.     Sarrah Fiorenza A 06/25/2012, 11:49 PM

## 2012-06-25 NOTE — Consult Note (Signed)
Looks like this patient has 3 left rib fractures from a ground level fall with a small associated left pneumothorax.    With her multiple medical problems including renal failure it is appropriate for this patient to be admitted to a medical service.  We will follow her with a repeat CXR in the AM. No chest tube is needed at this time.  Marta Lamas. Gae Bon, MD, FACS [phone] Trauma Surgeon

## 2012-06-25 NOTE — ED Notes (Signed)
PT has had Fentanyl IV by ems

## 2012-06-25 NOTE — ED Provider Notes (Signed)
History     CSN: [id number]  Arrival date & time 06/25/12  0347   First MD Initiated Contact with Patient 06/25/12 1821      Chief Complaint  Patient presents with  . Fall    left side rib pain    (Consider location/radiation/quality/duration/timing/severity/associated sxs/prior treatment) HPI Comments: Patient was walking and stumbled. Her left flank on the table. Since then she has had left low chest pain and left upper quadrant pain and has had several episodes of vomiting.  Patient is a 78 y.o. female presenting with fall. The history is provided by the patient and the EMS personnel. No language interpreter was used.  Fall The accident occurred 1 to 2 hours ago. The fall occurred while walking. She fell from a height of 1 to 2 ft. She landed on a hard floor. There was no blood loss. Point of impact: L chest, abd. Pain location: L chest, abd. The pain is at a severity of 10/10. The pain is severe. She was ambulatory at the scene. There was no entrapment after the fall. There was no drug use involved in the accident. There was no alcohol use involved in the accident. Associated symptoms include abdominal pain (luq). Pertinent negatives include no fever, no nausea, no vomiting, no headaches and no loss of consciousness. The symptoms are aggravated by pressure on the injury. She has tried nothing for the symptoms. The treatment provided no relief.    Past Medical History  Diagnosis Date  . Hypertension   . Diabetes mellitus   . Hyperlipidemia   . GERD (gastroesophageal reflux disease)   . Glaucoma   . Hammer toe   . Internal hemorrhoids   . Diverticulosis   . Colon polyps   . Peripheral vascular disease   . Anemia   . COPD (chronic obstructive pulmonary disease)   . Stroke 10/2010  . Myocardial infarction 10/2010  . Chronic kidney disease     Tues, Thurs, Sat @ [address]     Past Surgical History  Procedure Date  . Av fistula repair 02/16/2011    Revision of left  brachiocephalic arteriovenous fistula with patch angioplasty of left brachiocephalic fistula  . Av fistula placement, brachiocephalic 09/24/2010    Left  . Av fistula placement   . Cataract extraction   . Abdominal hysterectomy   . Av fistula placement 08/14/2011    Procedure: ARTERIOVENOUS (AV) FISTULA CREATION;  Surgeon: Chuck Hint, MD;  Location: [facility] OR;  Service: Vascular;  Laterality: Right;  Right Arm Arteriovenous Fistula Creation/ contact pt's niece,Claudia Rich for any information to be relayed to pt: # [phone]  . Insertion of dialysis catheter 09/28/2011    Procedure: INSERTION OF DIALYSIS CATHETER;  Surgeon: Sherren Kerns, MD;  Location: [facility] OR;  Service: Vascular;  Laterality: Left;  Insertion of Dialysis Catheter left IJ  . Eye surgery   . Av fistula placement 12/07/2011    Procedure: INSERTION OF ARTERIOVENOUS (AV) GORE-TEX GRAFT ARM;  Surgeon: Sherren Kerns, MD;  Location: [facility] OR;  Service: Vascular;  Laterality: Left;    Family History  Problem Relation Age of Onset  . Anesthesia problems Neg Hx     History  Substance Use Topics  . Smoking status: Never Smoker   . Smokeless tobacco: Never Used  . Alcohol Use: No    OB History    Grav Para Term Preterm Abortions TAB SAB Ect Mult Living  Review of Systems  Constitutional: Negative for fever, chills, activity change and appetite change.  HENT: Negative for congestion, rhinorrhea, neck pain, neck stiffness and sinus pressure.   Eyes: Negative for discharge and visual disturbance.  Respiratory: Negative for cough, chest tightness, shortness of breath, wheezing and stridor.   Cardiovascular: Positive for chest pain (L lower lateral). Negative for leg swelling.  Gastrointestinal: Positive for abdominal pain (luq). Negative for nausea, vomiting, diarrhea and abdominal distention.  Genitourinary: Negative for decreased urine volume and difficulty urinating.  Musculoskeletal: Negative for  back pain and arthralgias.  Skin: Negative for color change and pallor.  Neurological: Negative for loss of consciousness, weakness, light-headedness and headaches.  Psychiatric/Behavioral: Negative for behavioral problems and agitation.  All other systems reviewed and are negative.    Allergies  Aspirin; Codeine sulfate; and Meperidine hcl  Home Medications   Current Outpatient Rx  Name Route Sig Dispense Refill  . AMLODIPINE BESYLATE 10 MG PO TABS Oral Take 1 tablet (10 mg total) by mouth daily. 30 tablet 6  . ASPIRIN 81 MG PO CHEW Oral Chew 81 mg by mouth daily.    . ATORVASTATIN CALCIUM 80 MG PO TABS Oral Take 1 tablet (80 mg total) by mouth daily. 30 tablet 6  . RENAL MULTIVITAMIN FORMULA PO Oral Take 10 mg by mouth daily.     Marland Kitchen CALCIUM ACETATE 667 MG PO CAPS Oral Take 667 mg by mouth 2 (two) times daily.      Marland Kitchen GLIPIZIDE 10 MG PO TABS Oral Take 2 tablets (20 mg total) by mouth 2 (two) times daily before a meal. 120 tablet 6  . METOPROLOL TARTRATE 25 MG PO TABS Oral Take 1 tablet (25 mg total) by mouth See admin instructions. Take 1 tablet twice daily on non-dialysis days. Take 1 tablet in the evening on Tuesday, Thursday and Saturday. 60 tablet 6  . ONDANSETRON HCL 4 MG PO TABS Oral Take 4 mg by mouth every 8 (eight) hours as needed. For nausea.    Marland Kitchen RANITIDINE HCL 150 MG PO TABS Oral Take 1 tablet (150 mg total) by mouth 2 (two) times daily. 60 tablet 6  . SITAGLIPTIN PHOSPHATE 25 MG PO TABS Oral Take 1 tablet (25 mg total) by mouth daily. 30 tablet 6  . VENTOLIN HFA 108 (90 BASE) MCG/ACT IN AERS Inhalation Inhale 2 puffs into the lungs every 6 (six) hours as needed. For shortness of breath.      BP 76/40  Pulse 64  Resp 16  SpO2 95%  Physical Exam  Nursing note and vitals reviewed. Constitutional: She is oriented to person, place, and time. She appears well-developed and well-nourished. She appears distressed (in pain).  HENT:  Head: Normocephalic and atraumatic.    Mouth/Throat: No oropharyngeal exudate.  Eyes: EOM are normal. Pupils are equal, round, and reactive to light. Right eye exhibits no discharge. Left eye exhibits no discharge.  Neck: Normal range of motion. Neck supple. No JVD present.  Cardiovascular: Normal rate, regular rhythm and normal heart sounds.   Pulmonary/Chest: Effort normal and breath sounds normal. No stridor. No respiratory distress. She exhibits tenderness (L lower lateral).  Abdominal: Soft. Bowel sounds are normal. She exhibits no distension. There is tenderness (LUQ). There is guarding (LUQ).  Musculoskeletal: Normal range of motion. She exhibits no edema and no tenderness.  Neurological: She is alert and oriented to person, place, and time. No cranial nerve deficit. She exhibits normal muscle tone.  Skin: Skin is warm and dry. No  rash noted. She is not diaphoretic.  Psychiatric: She has a normal mood and affect. Her behavior is normal. Judgment and thought content normal.    ED Course  Procedures (including critical care time)  Labs Reviewed  CBC WITH DIFFERENTIAL - Abnormal; Notable for the following:    RBC 2.65 (*)     Hemoglobin 8.5 (*)     HCT 26.8 (*)     MCV 101.1 (*)     Platelets 100 (*)  PLATELET COUNT CONFIRMED BY SMEAR   Neutrophils Relative 90 (*)     Neutro Abs 9.3 (*)     Lymphocytes Relative 3 (*)     Lymphs Abs 0.4 (*)     All other components within normal limits  BASIC METABOLIC PANEL - Abnormal; Notable for the following:    Potassium <2.0 (*)     Chloride 115 (*)     Glucose, Bld 177 (*)     Creatinine, Ser 2.27 (*)     Calcium 5.1 (*)     GFR calc non Af Amer 19 (*)     GFR calc Af Amer 22 (*)     All other components within normal limits  PROTIME-INR - Abnormal; Notable for the following:    Prothrombin Time 17.5 (*)     All other components within normal limits  APTT  BASIC METABOLIC PANEL  CBC WITH DIFFERENTIAL   Ct Chest W Contrast  06/25/2012  *RADIOLOGY REPORT*  Clinical  Data:  The patient fell today and complains of left rib pain.  Left upper quadrant pain.  CT CHEST, ABDOMEN AND PELVIS WITH CONTRAST  Technique:  Multidetector CT imaging of the chest, abdomen and pelvis was performed following the standard protocol during bolus administration of intravenous contrast.  Contrast: OMNIPAQUE IOHEXOL 300 MG/ML  SOLN  Comparison:   None.  CT CHEST  Findings:  Mild cardiac enlargement.  Coronary artery calcification.  Normal caliber thoracic aorta with calcification. No significant mediastinal lymphadenopathy.  The esophagus is decompressed.  Small esophageal hiatal hernia.  There is a small left pleural effusion with small left pneumothorax.  There is a fibrosis or atelectasis in the lung bases with bronchial wall thickening suggesting chronic bronchitis. Airways appear patent.  Normal alignment of the thoracic spine without vertebral compression deformity.  The sternum appears intact.  Comminuted fractures of the left posterior and lateral seventh and eighth ribs with fractures of multiple locations and depression of fractures of the seventh rib posteriorly.  IMPRESSION: Comminuted and displaced fractures of the left seventh and eighth ribs with small left pleural effusion and small left pneumothorax. Consolidation or atelectasis in the lung bases with chronic bronchitic changes.  CT ABDOMEN AND PELVIS  Findings:  Normal alignment of the lumbar vertebrae without compression deformity.  Degenerative changes in the lumbar spine with degenerative disc disease.  The sacrum, pelvis, and hips appear intact.  The liver, spleen, gallbladder, adrenal glands, and retroperitoneal lymph nodes are unremarkable.  There is a circumscribed appearing cystic lesion in the tail of the pancreas measuring about 1.6 cm diameter.  This could represent a simple cyst or cystic neoplasm. 55-month follow-up study is recommended.  Small accessory spleen. The kidneys are atrophic without evidence of solid mass  or hydronephrosis.  Calcification of the aorta without aneurysm.  The stomach, small bowel, and colon are not abnormally distended and there is no appreciable wall thickening.  No free air or free fluid in the abdomen.  No mesenteric or retroperitoneal fluid  collections.  Pelvis:  The bladder wall is not thickened.  The uterus appears to be surgically absent.  No abnormal adnexal masses.  No free or loculated pelvic fluid collections.  No evidence of diverticulitis. The appendix is not identified.  Abdominal wall musculature appears intact.  IMPRESSION: No acute post-traumatic changes demonstrated in the abdomen or pelvis.  No evidence of solid organ injury or bowel perforation. There is incidental note of a 1.6 cm diameter cystic lesion in the tail of the pancreas.  84-month follow-up is recommended.  Results were discussed by telephone at 2157 hours on 06/25/2012 with Dr. Ambrose Mantle.   Original Report Authenticated By: Burman Nieves, M.D.    Ct Abdomen Pelvis W Contrast  06/25/2012  *RADIOLOGY REPORT*  Clinical Data:  The patient fell today and complains of left rib pain.  Left upper quadrant pain.  CT CHEST, ABDOMEN AND PELVIS WITH CONTRAST  Technique:  Multidetector CT imaging of the chest, abdomen and pelvis was performed following the standard protocol during bolus administration of intravenous contrast.  Contrast: OMNIPAQUE IOHEXOL 300 MG/ML  SOLN  Comparison:   None.  CT CHEST  Findings:  Mild cardiac enlargement.  Coronary artery calcification.  Normal caliber thoracic aorta with calcification. No significant mediastinal lymphadenopathy.  The esophagus is decompressed.  Small esophageal hiatal hernia.  There is a small left pleural effusion with small left pneumothorax.  There is a fibrosis or atelectasis in the lung bases with bronchial wall thickening suggesting chronic bronchitis. Airways appear patent.  Normal alignment of the thoracic spine without vertebral compression deformity.  The sternum  appears intact.  Comminuted fractures of the left posterior and lateral seventh and eighth ribs with fractures of multiple locations and depression of fractures of the seventh rib posteriorly.  IMPRESSION: Comminuted and displaced fractures of the left seventh and eighth ribs with small left pleural effusion and small left pneumothorax. Consolidation or atelectasis in the lung bases with chronic bronchitic changes.  CT ABDOMEN AND PELVIS  Findings:  Normal alignment of the lumbar vertebrae without compression deformity.  Degenerative changes in the lumbar spine with degenerative disc disease.  The sacrum, pelvis, and hips appear intact.  The liver, spleen, gallbladder, adrenal glands, and retroperitoneal lymph nodes are unremarkable.  There is a circumscribed appearing cystic lesion in the tail of the pancreas measuring about 1.6 cm diameter.  This could represent a simple cyst or cystic neoplasm. 73-month follow-up study is recommended.  Small accessory spleen. The kidneys are atrophic without evidence of solid mass or hydronephrosis.  Calcification of the aorta without aneurysm.  The stomach, small bowel, and colon are not abnormally distended and there is no appreciable wall thickening.  No free air or free fluid in the abdomen.  No mesenteric or retroperitoneal fluid collections.  Pelvis:  The bladder wall is not thickened.  The uterus appears to be surgically absent.  No abnormal adnexal masses.  No free or loculated pelvic fluid collections.  No evidence of diverticulitis. The appendix is not identified.  Abdominal wall musculature appears intact.  IMPRESSION: No acute post-traumatic changes demonstrated in the abdomen or pelvis.  No evidence of solid organ injury or bowel perforation. There is incidental note of a 1.6 cm diameter cystic lesion in the tail of the pancreas.  77-month follow-up is recommended.  Results were discussed by telephone at 2157 hours on 06/25/2012 with Dr. Ambrose Mantle.   Original Report  Authenticated By: Burman Nieves, M.D.    Dg Chest Portable 1 View  06/25/2012  *  RADIOLOGY REPORT*  Clinical Data: Fall.  Left rib pain.  PORTABLE CHEST - 1 VIEW  Comparison: 09/28/2011  Findings: Multiple left-sided rib fractures.  This involves ribs six through eight.  There appear to be fractures both posteriorly and laterally of ribs seven and eight.  There is question of a small left apical pneumothorax.  Cardiomegaly.  Bibasilar atelectasis and small left pleural effusion.  IMPRESSION: Multiple left-sided rib fractures as above.  Small associated left pleural effusion.  Suspect tiny left apical pneumothorax.  Cardiomegaly.   Original Report Authenticated By: Charlett Nose, M.D.      1. Fall   2. Fracture of rib of left side   3. Pneumothorax, left       Date: 06/25/2012  Rate: 63  Rhythm: normal sinus rhythm  QRS Axis: normal  Intervals: normal  ST/T Wave abnormalities: normal  Conduction Disutrbances: none  Narrative Interpretation: nml  Old EKG Reviewed: No significant changes noted    MDM  To be admitted to medicine w/ tele. C/w rib fxs and small ptx on left. This does not need a chest tube. Pt 97% on RA. BMP likely lab error, will repeat. Stable  Admitted to medicine in stable condition       Warrick Parisian, MD 06/25/12 2328

## 2012-06-25 NOTE — ED Notes (Signed)
Phlebotomy notified labs need to be drawn.

## 2012-06-25 NOTE — ED Provider Notes (Signed)
Pt seen on arrival with resident Xray shows tiny apical PTX with rib fractures I personally spoke to Dr Lindie Spruce about these findings, and if no other acute traumatic injury can be admitted to medicine Pt currently stable  Joya Gaskins, MD 06/25/12 1942

## 2012-06-25 NOTE — ED Notes (Signed)
Spoke with patient and POA regarding patients status, explained we are still waiting on results of labs and CT scans.

## 2012-06-25 NOTE — ED Notes (Signed)
PT fell from stumbling and hit left side into table.  Pt has vomited several times.  Pt is HD patient and had dialysis today.  [id number].

## 2012-06-25 NOTE — ED Provider Notes (Signed)
I have personally seen and examined the patient.  I have discussed the plan of care with the resident.  I have reviewed the documentation on PMH/FH/Soc. History.  I have reviewed the documentation of the resident and agree.   Joya Gaskins, MD 06/25/12 2340

## 2012-06-26 ENCOUNTER — Observation Stay (HOSPITAL_COMMUNITY): Payer: Medicare HMO

## 2012-06-26 ENCOUNTER — Encounter (HOSPITAL_COMMUNITY): Payer: Self-pay | Admitting: *Deleted

## 2012-06-26 LAB — BASIC METABOLIC PANEL
BUN: 9 mg/dL (ref 6–23)
CO2: 26 mEq/L (ref 19–32)
Calcium: 8.5 mg/dL (ref 8.4–10.5)
Calcium: 8.6 mg/dL (ref 8.4–10.5)
Creatinine, Ser: 3.77 mg/dL — ABNORMAL HIGH (ref 0.50–1.10)
Creatinine, Ser: 4.6 mg/dL — ABNORMAL HIGH (ref 0.50–1.10)
GFR calc Af Amer: 22 mL/min — ABNORMAL LOW (ref >90)
GFR calc non Af Amer: 19 mL/min — ABNORMAL LOW (ref >90)
GFR calc non Af Amer: 10 mL/min — ABNORMAL LOW (ref >90)
GFR calc non Af Amer: 8 mL/min — ABNORMAL LOW (ref >90)
Glucose, Bld: 237 mg/dL — ABNORMAL HIGH (ref 70–99)
Potassium: <2 mEq/L — CL (ref 3.5–5.1)
Sodium: 143 mEq/L (ref 135–145)
Sodium: 134 mEq/L — ABNORMAL LOW (ref 135–145)

## 2012-06-26 LAB — CBC WITH DIFFERENTIAL/PLATELET
Basophils Absolute: 0 10*3/uL (ref 0.0–0.1)
Eosinophils Absolute: 0 10*3/uL (ref 0.0–0.7)
Eosinophils Relative: 0 % (ref 0–5)
MCH: 32 pg (ref 26.0–34.0)
MCV: 100 fL (ref 78.0–100.0)
Platelets: 137 10*3/uL — ABNORMAL LOW (ref 150–400)
RDW: 13.8 % (ref 11.5–15.5)
WBC: 13.5 10*3/uL — ABNORMAL HIGH (ref 4.0–10.5)

## 2012-06-26 LAB — CBC
MCH: 31.7 pg (ref 26.0–34.0)
MCV: 99.2 fL (ref 78.0–100.0)
Platelets: 146 10*3/uL — ABNORMAL LOW (ref 150–400)
RBC: 3.75 MIL/uL — ABNORMAL LOW (ref 3.87–5.11)
RDW: 13.7 % (ref 11.5–15.5)
WBC: 11.9 10*3/uL — ABNORMAL HIGH (ref 4.0–10.5)

## 2012-06-26 LAB — MRSA PCR SCREENING: MRSA by PCR: NEGATIVE

## 2012-06-26 LAB — GLUCOSE, CAPILLARY
Glucose-Capillary: 120 mg/dL — ABNORMAL HIGH (ref 70–99)
Glucose-Capillary: 148 mg/dL — ABNORMAL HIGH (ref 70–99)

## 2012-06-26 MED ORDER — ATORVASTATIN CALCIUM 80 MG PO TABS
80.0000 mg | ORAL_TABLET | Freq: Every day | ORAL | Status: DC
  Administered 2012-06-26 – 2012-07-01 (×6): 80 mg via ORAL
  Filled 2012-06-26 (×6): qty 1

## 2012-06-26 MED ORDER — CALCIUM ACETATE 667 MG PO CAPS
667.0000 mg | ORAL_CAPSULE | Freq: Two times a day (BID) | ORAL | Status: DC
  Administered 2012-06-26 – 2012-06-27 (×3): 667 mg via ORAL
  Filled 2012-06-26 (×4): qty 1

## 2012-06-26 MED ORDER — INSULIN ASPART 100 UNIT/ML ~~LOC~~ SOLN
0.0000 [IU] | Freq: Every day | SUBCUTANEOUS | Status: DC
  Administered 2012-06-26 – 2012-06-29 (×2): 2 [IU] via SUBCUTANEOUS

## 2012-06-26 MED ORDER — SODIUM CHLORIDE 0.9 % IJ SOLN
3.0000 mL | Freq: Two times a day (BID) | INTRAMUSCULAR | Status: DC
  Administered 2012-06-26 – 2012-07-01 (×6): 3 mL via INTRAVENOUS

## 2012-06-26 MED ORDER — SODIUM CHLORIDE 0.9 % IJ SOLN
3.0000 mL | Freq: Two times a day (BID) | INTRAMUSCULAR | Status: DC
  Administered 2012-06-26 – 2012-06-30 (×7): 3 mL via INTRAVENOUS

## 2012-06-26 MED ORDER — HYDROCODONE-ACETAMINOPHEN 5-325 MG PO TABS
1.0000 | ORAL_TABLET | ORAL | Status: DC | PRN
  Administered 2012-06-26 – 2012-07-01 (×10): 2 via ORAL
  Filled 2012-06-26 (×10): qty 2

## 2012-06-26 MED ORDER — INSULIN ASPART 100 UNIT/ML ~~LOC~~ SOLN
0.0000 [IU] | Freq: Three times a day (TID) | SUBCUTANEOUS | Status: DC
  Administered 2012-06-26 – 2012-06-27 (×2): 3 [IU] via SUBCUTANEOUS
  Administered 2012-06-27: 2 [IU] via SUBCUTANEOUS
  Administered 2012-06-27: 5 [IU] via SUBCUTANEOUS
  Administered 2012-06-29 (×2): 3 [IU] via SUBCUTANEOUS
  Administered 2012-06-30: 5 [IU] via SUBCUTANEOUS
  Administered 2012-06-30: 8 [IU] via SUBCUTANEOUS
  Administered 2012-07-01: 5 [IU] via SUBCUTANEOUS
  Administered 2012-07-01: 3 [IU] via SUBCUTANEOUS
  Administered 2012-07-01: 5 [IU] via SUBCUTANEOUS

## 2012-06-26 MED ORDER — GLIPIZIDE 10 MG PO TABS
20.0000 mg | ORAL_TABLET | Freq: Two times a day (BID) | ORAL | Status: DC
  Administered 2012-06-26: 20 mg via ORAL
  Filled 2012-06-26 (×3): qty 2

## 2012-06-26 MED ORDER — SODIUM CHLORIDE 0.9 % IJ SOLN: 3.0000 mL | INTRAMUSCULAR | Status: DC | PRN

## 2012-06-26 MED ORDER — MORPHINE SULFATE 2 MG/ML IJ SOLN
2.0000 mg | INTRAMUSCULAR | Status: DC | PRN
  Administered 2012-06-27 – 2012-06-30 (×6): 2 mg via INTRAVENOUS
  Filled 2012-06-26 (×5): qty 1

## 2012-06-26 MED ORDER — SODIUM CHLORIDE 0.9 % IV SOLN
250.0000 mL | INTRAVENOUS | Status: DC | PRN
  Administered 2012-06-28: 250 mL via INTRAVENOUS

## 2012-06-26 NOTE — Progress Notes (Signed)
Patient arrived to the floor via stretcher from the ED at approximately 0045.  Patient is A&O x 4.  She is placed on Telemetry per orders.  She is from home alone.  Her medical power of attorney is [name] - telephone number [phone].  She is complaining of severe pain to left rib cage.  Morphine 4 mg IV given immediately prior to arrival from floor.  She has a LUA AVF that is negative for thrill and bruit.  I was able to doppler the thrill.  This was assessed by two RNs.  Called [facility] and advised of the situation.  No new orders received.  Skin is dry and flaky.  Patient does have her purse and wallet at bedside.  I offered to have Security put purse and wallet in safe.  She refused since family is coming from [place] in the AM and she will give to them.  I advised that we are not responsible for valuables and she understands.  Will continue to monitor patient.  Keandrea Tapley, Justine Null

## 2012-06-26 NOTE — Progress Notes (Signed)
[facility] PROGRESS NOTE  Claudia Rich HQI:[id number] DOB: 23-Jul-1927 DOA: 06/25/2012 PCP: Lillia Abed, MD  Assessment/Plan: Traumatic pneumothorax -Trauma surgery following -No distress or hypoxemia at this time -Serial chest x-rays -No syncope -Check orthostatics Closed rib fracture -Continue opioids End-stage renal disease -Consult nephrology if patient stays until Tuesday Hypertension -Restart metoprolol and amlodipine -Patient has very poor insight on her medications. I have concerns regarding patient taking her medications properly Diabetes mellitus type 2 -Continue Januvia -NovoLog sliding scale -Continue aspirin   Family Communication:   [name] (niece) [phone] updated today Disposition Plan:   Niece wants to take patient to [facility] unstable      Procedures/Studies: Ct Chest W Contrast  06/25/2012  *RADIOLOGY REPORT*  Clinical Data:  The patient fell today and complains of left rib pain.  Left upper quadrant pain.  CT CHEST, ABDOMEN AND PELVIS WITH CONTRAST  Technique:  Multidetector CT imaging of the chest, abdomen and pelvis was performed following the standard protocol during bolus administration of intravenous contrast.  Contrast: OMNIPAQUE IOHEXOL 300 MG/ML  SOLN  Comparison:   None.  CT CHEST  Findings:  Mild cardiac enlargement.  Coronary artery calcification.  Normal caliber thoracic aorta with calcification. No significant mediastinal lymphadenopathy.  The esophagus is decompressed.  Small esophageal hiatal hernia.  There is a small left pleural effusion with small left pneumothorax.  There is a fibrosis or atelectasis in the lung bases with bronchial wall thickening suggesting chronic bronchitis. Airways appear patent.  Normal alignment of the thoracic spine without vertebral compression deformity.  The sternum appears intact.  Comminuted fractures of the left posterior and lateral seventh and eighth ribs with fractures of multiple  locations and depression of fractures of the seventh rib posteriorly.  IMPRESSION: Comminuted and displaced fractures of the left seventh and eighth ribs with small left pleural effusion and small left pneumothorax. Consolidation or atelectasis in the lung bases with chronic bronchitic changes.  CT ABDOMEN AND PELVIS  Findings:  Normal alignment of the lumbar vertebrae without compression deformity.  Degenerative changes in the lumbar spine with degenerative disc disease.  The sacrum, pelvis, and hips appear intact.  The liver, spleen, gallbladder, adrenal glands, and retroperitoneal lymph nodes are unremarkable.  There is a circumscribed appearing cystic lesion in the tail of the pancreas measuring about 1.6 cm diameter.  This could represent a simple cyst or cystic neoplasm. 3-month follow-up study is recommended.  Small accessory spleen. The kidneys are atrophic without evidence of solid mass or hydronephrosis.  Calcification of the aorta without aneurysm.  The stomach, small bowel, and colon are not abnormally distended and there is no appreciable wall thickening.  No free air or free fluid in the abdomen.  No mesenteric or retroperitoneal fluid collections.  Pelvis:  The bladder wall is not thickened.  The uterus appears to be surgically absent.  No abnormal adnexal masses.  No free or loculated pelvic fluid collections.  No evidence of diverticulitis. The appendix is not identified.  Abdominal wall musculature appears intact.  IMPRESSION: No acute post-traumatic changes demonstrated in the abdomen or pelvis.  No evidence of solid organ injury or bowel perforation. There is incidental note of a 1.6 cm diameter cystic lesion in the tail of the pancreas.  35-month follow-up is recommended.  Results were discussed by telephone at 2157 hours on 06/25/2012 with Dr. Ambrose Mantle.   Original Report Authenticated By: Burman Nieves, M.D.    Ct Abdomen Pelvis W Contrast  06/25/2012  *RADIOLOGY REPORT*  Clinical Data:  The  patient fell today and complains of left rib pain.  Left upper quadrant pain.  CT CHEST, ABDOMEN AND PELVIS WITH CONTRAST  Technique:  Multidetector CT imaging of the chest, abdomen and pelvis was performed following the standard protocol during bolus administration of intravenous contrast.  Contrast: OMNIPAQUE IOHEXOL 300 MG/ML  SOLN  Comparison:   None.  CT CHEST  Findings:  Mild cardiac enlargement.  Coronary artery calcification.  Normal caliber thoracic aorta with calcification. No significant mediastinal lymphadenopathy.  The esophagus is decompressed.  Small esophageal hiatal hernia.  There is a small left pleural effusion with small left pneumothorax.  There is a fibrosis or atelectasis in the lung bases with bronchial wall thickening suggesting chronic bronchitis. Airways appear patent.  Normal alignment of the thoracic spine without vertebral compression deformity.  The sternum appears intact.  Comminuted fractures of the left posterior and lateral seventh and eighth ribs with fractures of multiple locations and depression of fractures of the seventh rib posteriorly.  IMPRESSION: Comminuted and displaced fractures of the left seventh and eighth ribs with small left pleural effusion and small left pneumothorax. Consolidation or atelectasis in the lung bases with chronic bronchitic changes.  CT ABDOMEN AND PELVIS  Findings:  Normal alignment of the lumbar vertebrae without compression deformity.  Degenerative changes in the lumbar spine with degenerative disc disease.  The sacrum, pelvis, and hips appear intact.  The liver, spleen, gallbladder, adrenal glands, and retroperitoneal lymph nodes are unremarkable.  There is a circumscribed appearing cystic lesion in the tail of the pancreas measuring about 1.6 cm diameter.  This could represent a simple cyst or cystic neoplasm. 76-month follow-up study is recommended.  Small accessory spleen. The kidneys are atrophic without evidence of solid mass or  hydronephrosis.  Calcification of the aorta without aneurysm.  The stomach, small bowel, and colon are not abnormally distended and there is no appreciable wall thickening.  No free air or free fluid in the abdomen.  No mesenteric or retroperitoneal fluid collections.  Pelvis:  The bladder wall is not thickened.  The uterus appears to be surgically absent.  No abnormal adnexal masses.  No free or loculated pelvic fluid collections.  No evidence of diverticulitis. The appendix is not identified.  Abdominal wall musculature appears intact.  IMPRESSION: No acute post-traumatic changes demonstrated in the abdomen or pelvis.  No evidence of solid organ injury or bowel perforation. There is incidental note of a 1.6 cm diameter cystic lesion in the tail of the pancreas.  97-month follow-up is recommended.  Results were discussed by telephone at 2157 hours on 06/25/2012 with Dr. Ambrose Mantle.   Original Report Authenticated By: Burman Nieves, M.D.    Dg Chest Portable 1 View  06/25/2012  *RADIOLOGY REPORT*  Clinical Data: Fall.  Left rib pain.  PORTABLE CHEST - 1 VIEW  Comparison: 09/28/2011  Findings: Multiple left-sided rib fractures.  This involves ribs six through eight.  There appear to be fractures both posteriorly and laterally of ribs seven and eight.  There is question of a small left apical pneumothorax.  Cardiomegaly.  Bibasilar atelectasis and small left pleural effusion.  IMPRESSION: Multiple left-sided rib fractures as above.  Small associated left pleural effusion.  Suspect tiny left apical pneumothorax.  Cardiomegaly.   Original Report Authenticated By: Charlett Nose, M.D.          Subjective: Patient complains of left-sided chest pain. Denies any dizziness, shortness of breath, nausea, vomiting, abdominal pain, headache, visual changes,  fevers, chills  Objective: Filed Vitals:   06/26/12 0045 06/26/12 0407 06/26/12 0521 06/26/12 0843  BP: 183/82  169/67 164/67  Pulse: 79  77 80  Temp: 98.8 F  (37.1 C)  98.1 F (36.7 C) 97.9 F (36.6 C)  TempSrc: Oral  Oral Oral  Resp: 20  18 18   Height: 5\' 3"  (1.6 m) 5\' 3"  (1.6 m)    Weight: 69.3 kg (152 lb 12.5 oz)     SpO2: 94%  98% 98%    Intake/Output Summary (Last 24 hours) at 06/26/12 1002 Last data filed at 06/26/12 0726  Gross per 24 hour  Intake      0 ml  Output      0 ml  Net      0 ml   Weight change:  Exam:   General:  Pt is alert, follows commands appropriately  HEENT: No icterus, No thrush,  [place]/AT  Cardiovascular: RRR, S1/S2, no rubs, no gallops  Respiratory: CTA bilaterally, no wheezing, no crackles, no rhonchi  Abdomen: Soft/+BS, non tender, non distended, no guarding  Extremities: No edema, No lymphangitis, No petechiae, No rashes, no synovitis  Data Reviewed: Basic Metabolic Panel:  Lab 06/26/12 1610 06/25/12 2318 06/25/12 2140  NA 134* 133* 143  K 3.8 3.3* <2.0*  CL 95* 94* 115*  CO2 28 26 19   GLUCOSE 181* 237* 177*  BUN 17 13 9   CREATININE 4.60* 3.77* 2.27*  CALCIUM 8.6 8.5 5.1*  MG -- -- --  PHOS -- -- --   Liver Function Tests: No results found for this basename: AST:5,ALT:5,ALKPHOS:5,BILITOT:5,PROT:5,ALBUMIN:5 in the last 168 hours No results found for this basename: LIPASE:5,AMYLASE:5 in the last 168 hours No results found for this basename: AMMONIA:5 in the last 168 hours CBC:  Lab 06/26/12 0513 06/25/12 2318 06/25/12 2140  WBC 11.9* 13.5* 10.4  NEUTROABS -- 12.2* 9.3*  HGB 11.9* 12.2 8.5*  HCT 37.2 38.1 26.8*  MCV 99.2 100.0 101.1*  PLT 146* 137* 100*   Cardiac Enzymes: No results found for this basename: CKTOTAL:5,CKMB:5,CKMBINDEX:5,TROPONINI:5 in the last 168 hours BNP: No components found with this basename: POCBNP:5 CBG: No results found for this basename: GLUCAP:5 in the last 168 hours  Recent Results (from the past 240 hour(s))  MRSA PCR SCREENING     Status: Normal   Collection Time   06/26/12  2:30 AM      Component Value Range Status Comment   MRSA by PCR  NEGATIVE  NEGATIVE Final      Scheduled Meds:   . atorvastatin  80 mg Oral q1800  . calcium acetate  667 mg Oral BID  . glipiZIDE  20 mg Oral BID AC  . [EXPIRED] iohexol  20 mL Oral Q1 Hr x 2  . [COMPLETED]  morphine injection  4 mg Intravenous Once  . [COMPLETED]  morphine injection  4 mg Intravenous Once  . [COMPLETED] sodium chloride  1,000 mL Intravenous Once  . sodium chloride  3 mL Intravenous Q12H  . sodium chloride  3 mL Intravenous Q12H   Continuous Infusions:    Jarvis Sawa, DO  [facility] Pager [phone]  If 7PM-7AM, please contact night-coverage [email or web address] Password [email or web address] 06/26/2012, 10:02 AM   LOS: 1 day

## 2012-06-26 NOTE — Progress Notes (Signed)
Physical Therapy Evaluation Patient Details Name: Claudia Rich MRN: [id number] DOB: [birth year] Today's Date: 06/26/2012 Time: 0865-7846 PT Time Calculation (min): 29 min  PT Assessment / Plan / Recommendation Clinical Impression  78 yo female admitted post fall resulting in L rib fxs and L pneumothorax; Pt quite painful, and unable to provide specific of fall during eval; Presents with decr functional mobility, with exquisite pain limiting all movement; Will benefit form PT to maximize independence and safety with mobility and to facilitate dc planning; of note, pt lives alone, and at current functional status, will be unable to dc home alone; must consider SNF if pt does not progress well; Hopeful for pain to be better controlled and pt will progress to be able to better participate with PT    PT Assessment  Patient needs continued PT services    Follow Up Recommendations  Home health PT;Post acute inpatient    Does the patient have the potential to tolerate intense rehabilitation   No, Recommend SNF (though a screen isn't a bad idea)  Barriers to Discharge Decreased caregiver support lives alone    Equipment Recommendations  Tub/shower seat (Will need to verify her home equipment)    Recommendations for Other Services OT consult   Frequency Min 3X/week    Precautions / Restrictions Precautions Precautions: Fall   Pertinent Vitals/Pain Pain not rated, but pt crying throughout session Quick, shallow breaths; pt encouraged to breathe slowly and as deeply as possible;  Attempted using a pillow to splint ribs fox, but too painful      Mobility  Bed Mobility Bed Mobility: Sit to Supine Sit to Supine: 3: Mod assist Details for Bed Mobility Assistance: Cues for scooting hips for optimal positioning in bed; Essentially turned to lay down in upright sitting position, and then raised HOB to pt seconday to expusisite pain with moving; physical assist to help LEs into  bed Transfers Transfers: Sit to Stand;Stand to Sit Sit to Stand: 3: Mod assist;From chair/3-in-1 Stand to Sit: 4: Min assist;To bed;With upper extremity assist Details for Transfer Assistance: Cues for technique; Pt able to use Right UE for steadying; support given at bil elbows Ambulation/Gait Ambulation/Gait Assistance: 4: Min assist Ambulation Distance (Feet): 2 Feet (pivot steps back to bed) Assistive device: 1 person hand held assist (support given at elbows in shelf-arm type technique) Ambulation/Gait Assistance Details: very painful; pt crying    Shoulder Instructions     Exercises     PT Diagnosis: Difficulty walking;Generalized weakness;Acute pain  PT Problem List: Decreased strength;Decreased range of motion;Decreased activity tolerance;Decreased balance;Decreased mobility;Decreased knowledge of use of DME;Pain;Decreased knowledge of precautions PT Treatment Interventions: DME instruction;Gait training;Stair training;Functional mobility training;Therapeutic activities;Therapeutic exercise;Patient/family education   PT Goals Acute Rehab PT Goals PT Goal Formulation: With patient Time For Goal Achievement: 07/10/12 Potential to Achieve Goals: Good Pt will go Supine/Side to Sit: with modified independence PT Goal: Supine/Side to Sit - Progress: Goal set today Pt will go Sit to Supine/Side: with modified independence PT Goal: Sit to Supine/Side - Progress: Goal set today Pt will go Sit to Stand: with modified independence PT Goal: Sit to Stand - Progress: Goal set today Pt will go Stand to Sit: with modified independence PT Goal: Stand to Sit - Progress: Goal set today Pt will Transfer Bed to Chair/Chair to Bed: with modified independence PT Transfer Goal: Bed to Chair/Chair to Bed - Progress: Goal set today Pt will Ambulate: >150 feet;with modified independence;with rolling walker PT Goal: Ambulate - Progress: Goal  set today Pt will Go Up / Down Stairs: 1-2 stairs;with  modified independence;with rolling walker PT Goal: Up/Down Stairs - Progress: Goal set today Pt will Perform Home Exercise Program: Independently (Deep breathing with pillow splinting rib fxs) PT Goal: Perform Home Exercise Program - Progress: Goal set today  Visit Information  Last PT Received On: 06/26/12 Assistance Needed: +1    Subjective Data  Subjective: "we need more help" re: getting from [facility] back to bed with just her and this therapist Patient Stated Goal: Unable to state; teary-eyed secondary to pain   Prior Functioning  Home Living Lives With: Alone Available Help at Discharge: Personal care attendant (3x/wee, helps with IADLs, and occ bath) Type of Home: House Home Access: Stairs to enter [employer] of Steps: 2 Entrance Stairs-Rails:  (to be determined) Home Layout: One level Bathroom Shower/Tub: Nurse, adult Accessibility: Yes Home Adaptive Equipment: Walker - rolling;Bedside commode/3-in-1 Prior Function Level of Independence: Independent with assistive device(s) Comments: All of the info re: home situation, available assist, and PLOF need to be verified; Pt was quite internally distracted by pain, abl eto get a few words out, then wincing; difficulty ansering any questions Communication Communication: Other (comment) (difficulty talking secondary to pain)    Cognition  Overall Cognitive Status: Appears within functional limits for tasks assessed/performed Arousal/Alertness: Awake/alert Orientation Level: Appears intact for tasks assessed Behavior During Session: Anxious Cognition - Other Comments: Anxiety secondary to pain    Extremity/Trunk Assessment Right Upper Extremity Assessment RUE ROM/Strength/Tone: [facility] for tasks assessed Left Upper Extremity Assessment LUE ROM/Strength/Tone: Deficits LUE ROM/Strength/Tone Deficits: Hesitant to move LUE secondary to pain from rib fxs Right Lower Extremity Assessment RLE ROM/Strength/Tone:  Deficits RLE ROM/Strength/Tone Deficits: Generally weak, requiring assist for sit to stand Left Lower Extremity Assessment LLE ROM/Strength/Tone: Deficits LLE ROM/Strength/Tone Deficits: Generally weak, requiring assist for sit to stand   Balance    End of Session PT - End of Session Activity Tolerance: Patient limited by pain Patient left: in bed;with call [place]/phone within reach Nurse Communication: Mobility status  GP Functional Assessment Tool Used: Clinical judgement Functional Limitation: Mobility: Walking and moving around Mobility: Walking and Moving Around Current Status ([id number]): At least 40 percent but less than 60 percent impaired, limited or restricted Mobility: Walking and Moving Around Goal Status [phone]): At least 1 percent but less than 20 percent impaired, limited or restricted   [facility] [place], [place] [phone]  06/26/2012, 5:37 PM

## 2012-06-27 ENCOUNTER — Encounter (HOSPITAL_COMMUNITY): Payer: Self-pay | Admitting: Radiology

## 2012-06-27 ENCOUNTER — Observation Stay (HOSPITAL_COMMUNITY): Payer: Medicare HMO

## 2012-06-27 LAB — GLUCOSE, CAPILLARY
Glucose-Capillary: 141 mg/dL — ABNORMAL HIGH (ref 70–99)
Glucose-Capillary: 155 mg/dL — ABNORMAL HIGH (ref 70–99)
Glucose-Capillary: 143 mg/dL — ABNORMAL HIGH (ref 70–99)

## 2012-06-27 LAB — BASIC METABOLIC PANEL
Chloride: 94 mEq/L — ABNORMAL LOW (ref 96–112)
GFR calc Af Amer: 6 mL/min — ABNORMAL LOW (ref >90)
GFR calc non Af Amer: 5 mL/min — ABNORMAL LOW (ref >90)
Potassium: 3.8 mEq/L (ref 3.5–5.1)
Sodium: 134 mEq/L — ABNORMAL LOW (ref 135–145)

## 2012-06-27 LAB — CBC
MCHC: 32.5 g/dL (ref 30.0–36.0)
RDW: 14 % (ref 11.5–15.5)
WBC: 10 10*3/uL (ref 4.0–10.5)

## 2012-06-27 LAB — HEMOGLOBIN A1C
Hgb A1c MFr Bld: 7 % — ABNORMAL HIGH (ref <5.7)
Mean Plasma Glucose: 154 mg/dL — ABNORMAL HIGH (ref <117)

## 2012-06-27 LAB — LIPID PANEL: LDL Cholesterol: 43 mg/dL (ref 0–99)

## 2012-06-27 MED ORDER — METOPROLOL TARTRATE 25 MG PO TABS
25.0000 mg | ORAL_TABLET | Freq: Two times a day (BID) | ORAL | Status: DC
  Administered 2012-06-27 – 2012-06-30 (×6): 25 mg via ORAL
  Filled 2012-06-27 (×8): qty 1

## 2012-06-27 MED ORDER — CALCIUM ACETATE 667 MG PO CAPS
667.0000 mg | ORAL_CAPSULE | Freq: Two times a day (BID) | ORAL | Status: DC
  Administered 2012-06-27 – 2012-07-01 (×6): 667 mg via ORAL
  Filled 2012-06-27 (×10): qty 1

## 2012-06-27 MED ORDER — AMLODIPINE BESYLATE 10 MG PO TABS
10.0000 mg | ORAL_TABLET | Freq: Every day | ORAL | Status: DC
  Administered 2012-06-27 – 2012-06-29 (×2): 10 mg via ORAL
  Filled 2012-06-27 (×3): qty 1

## 2012-06-27 MED ORDER — MOXIFLOXACIN HCL 400 MG PO TABS
400.0000 mg | ORAL_TABLET | Freq: Every day | ORAL | Status: DC
  Administered 2012-06-27 – 2012-07-01 (×5): 400 mg via ORAL
  Filled 2012-06-27 (×5): qty 1

## 2012-06-27 MED ORDER — HEPARIN SODIUM (PORCINE) 1000 UNIT/ML DIALYSIS
20.0000 [IU]/kg | INTRAMUSCULAR | Status: DC | PRN
  Filled 2012-06-27: qty 2

## 2012-06-27 NOTE — Progress Notes (Signed)
Rehab Admissions Coordinator Note:  Patient was screened by Meryl Dare for appropriateness for an Inpatient Acute Rehab Consult.  Patient's insurance, [employer] would not approve CIR stay for this dx. At this time, we are recommending Skilled Nursing Facility.  Meryl Dare 06/27/2012, 10:12 AM  I can be reached at [phone].

## 2012-06-27 NOTE — H&P (Signed)
Claudia Rich is an 78 y.o. female.   Chief Complaint: pt fell at home after weak spell 11/2 Larey Seat against bathroom sink Work up reveals 3 fx ribs and sliver of PTX on left Pt dialyzed 11/2 with good flow but no thrill in LUA dialysis graft today Request for thrombolysis with possible angioplasty/stent in am Possible dialysis catheter placement if needed HPI: ESRD; HTN; DM; HLD; GERD; glaucoma; divertic; COPD; CVA; CAD/MI  Past Medical History  Diagnosis Date  . Hypertension   . Diabetes mellitus   . Hyperlipidemia   . GERD (gastroesophageal reflux disease)   . Glaucoma   . Hammer toe   . Internal hemorrhoids   . Diverticulosis   . Colon polyps   . Peripheral vascular disease   . Anemia   . COPD (chronic obstructive pulmonary disease)   . Stroke 10/2010  . Myocardial infarction 10/2010  . Chronic kidney disease     Tues, Thurs, Sat @ [address]     Past Surgical History  Procedure Date  . Av fistula repair 02/16/2011    Revision of left brachiocephalic arteriovenous fistula with patch angioplasty of left brachiocephalic fistula  . Av fistula placement, brachiocephalic 09/24/2010    Left  . Av fistula placement   . Cataract extraction   . Abdominal hysterectomy   . Av fistula placement 08/14/2011    Procedure: ARTERIOVENOUS (AV) FISTULA CREATION;  Surgeon: Chuck Hint, MD;  Location: [facility] OR;  Service: Vascular;  Laterality: Right;  Right Arm Arteriovenous Fistula Creation/ contact pt's niece,[name] for any information to be relayed to pt: # [phone]  . Insertion of dialysis catheter 09/28/2011    Procedure: INSERTION OF DIALYSIS CATHETER;  Surgeon: Sherren Kerns, MD;  Location: [facility] OR;  Service: Vascular;  Laterality: Left;  Insertion of Dialysis Catheter left IJ  . Eye surgery   . Av fistula placement 12/07/2011    Procedure: INSERTION OF ARTERIOVENOUS (AV) GORE-TEX GRAFT ARM;  Surgeon: Sherren Kerns, MD;  Location: [facility] OR;  Service: Vascular;   Laterality: Left;    Family History  Problem Relation Age of Onset  . Anesthesia problems Neg Hx    Social History:  reports that she has never smoked. She has never used smokeless tobacco. She reports that she does not drink alcohol or use illicit drugs.  Allergies:  Allergies  Allergen Reactions  . Aspirin     Upset stomach  . Codeine Sulfate Other (See Comments)    unknown  . Meperidine Hcl     REACTION: vomiting    Medications Prior to Admission  Medication Sig Dispense Refill  . albuterol (PROVENTIL HFA;VENTOLIN HFA) 108 (90 BASE) MCG/ACT inhaler Inhale 2 puffs into the lungs every 6 (six) hours as needed. Shortness of breath      . amLODipine (NORVASC) 10 MG tablet Take 1 tablet (10 mg total) by mouth daily.  30 tablet  6  . aspirin 81 MG chewable tablet Chew 81 mg by mouth daily.      Marland Kitchen atorvastatin (LIPITOR) 80 MG tablet Take 1 tablet (80 mg total) by mouth daily.  30 tablet  6  . B Complex-C-Folic Acid (RENAL MULTIVITAMIN FORMULA PO) Take 1 tablet by mouth daily.       . calcium acetate (PHOSLO) 667 MG capsule Take 667 mg by mouth 2 (two) times daily.        Marland Kitchen glipiZIDE (GLUCOTROL) 10 MG tablet Take 2 tablets (20 mg total) by mouth 2 (two) times daily before  a meal.  120 tablet  6  . metoprolol tartrate (LOPRESSOR) 25 MG tablet Take 1 tablet (25 mg total) by mouth See admin instructions. Take 1 tablet twice daily on non-dialysis days. Take 1 tablet in the evening on Tuesday, Thursday and Saturday.  60 tablet  6  . ondansetron (ZOFRAN) 4 MG tablet Take 4 mg by mouth every 8 (eight) hours as needed. For nausea.      . ranitidine (ZANTAC) 150 MG tablet Take 1 tablet (150 mg total) by mouth 2 (two) times daily.  60 tablet  6  . sitaGLIPtin (JANUVIA) 25 MG tablet Take 1 tablet (25 mg total) by mouth daily.  30 tablet  6    Results for orders placed during the hospital encounter of 06/25/12 (from the past 48 hour(s))  CBC WITH DIFFERENTIAL     Status: Abnormal   Collection  Time   06/25/12  9:40 PM      Component Value Range Comment   WBC 10.4  4.0 - 10.5 K/uL    RBC 2.65 (*) 3.87 - 5.11 MIL/uL    Hemoglobin 8.5 (*) 12.0 - 15.0 g/dL    HCT 45.4 (*) 09.8 - 46.0 %    MCV 101.1 (*) 78.0 - 100.0 fL    MCH 32.1  26.0 - 34.0 pg    MCHC 31.7  30.0 - 36.0 g/dL    RDW 11.9  14.7 - 82.9 %    Platelets 100 (*) 150 - 400 K/uL PLATELET COUNT CONFIRMED BY SMEAR   Neutrophils Relative 90 (*) 43 - 77 %    Neutro Abs 9.3 (*) 1.7 - 7.7 K/uL    Lymphocytes Relative 3 (*) 12 - 46 %    Lymphs Abs 0.4 (*) 0.7 - 4.0 K/uL    Monocytes Relative 7  3 - 12 %    Monocytes Absolute 0.7  0.1 - 1.0 K/uL    Eosinophils Relative 0  0 - 5 %    Eosinophils Absolute 0.0  0.0 - 0.7 K/uL    Basophils Relative 0  0 - 1 %    Basophils Absolute 0.0  0.0 - 0.1 K/uL   BASIC METABOLIC PANEL     Status: Abnormal   Collection Time   06/25/12  9:40 PM      Component Value Range Comment   Sodium 143  135 - 145 mEq/L    Potassium <2.0 (*) 3.5 - 5.1 mEq/L    Chloride 115 (*) 96 - 112 mEq/L    CO2 19  19 - 32 mEq/L    Glucose, Bld 177 (*) 70 - 99 mg/dL    BUN 9  6 - 23 mg/dL    Creatinine, Ser 5.62 (*) 0.50 - 1.10 mg/dL    Calcium 5.1 (*) 8.4 - 10.5 mg/dL    GFR calc non Af Amer 19 (*) >90 mL/min    GFR calc Af Amer 22 (*) >90 mL/min   PROTIME-INR     Status: Abnormal   Collection Time   06/25/12  9:40 PM      Component Value Range Comment   Prothrombin Time 17.5 (*) 11.6 - 15.2 seconds    INR 1.48  0.00 - 1.49   APTT     Status: Normal   Collection Time   06/25/12  9:40 PM      Component Value Range Comment   aPTT 34  24 - 37 seconds   BASIC METABOLIC PANEL     Status: Abnormal  Collection Time   06/25/12 11:18 PM      Component Value Range Comment   Sodium 133 (*) 135 - 145 mEq/L DELTA CHECK NOTED   Potassium 3.3 (*) 3.5 - 5.1 mEq/L    Chloride 94 (*) 96 - 112 mEq/L    CO2 26  19 - 32 mEq/L    Glucose, Bld 237 (*) 70 - 99 mg/dL    BUN 13  6 - 23 mg/dL    Creatinine, Ser 1.61 (*)  0.50 - 1.10 mg/dL    Calcium 8.5  8.4 - 09.6 mg/dL    GFR calc non Af Amer 10 (*) >90 mL/min    GFR calc Af Amer 12 (*) >90 mL/min   CBC WITH DIFFERENTIAL     Status: Abnormal   Collection Time   06/25/12 11:18 PM      Component Value Range Comment   WBC 13.5 (*) 4.0 - 10.5 K/uL    RBC 3.81 (*) 3.87 - 5.11 MIL/uL    Hemoglobin 12.2  12.0 - 15.0 g/dL    HCT 04.5  40.9 - 81.1 %    MCV 100.0  78.0 - 100.0 fL    MCH 32.0  26.0 - 34.0 pg    MCHC 32.0  30.0 - 36.0 g/dL    RDW 91.4  78.2 - 95.6 %    Platelets 137 (*) 150 - 400 K/uL    Neutrophils Relative 90 (*) 43 - 77 %    Neutro Abs 12.2 (*) 1.7 - 7.7 K/uL    Lymphocytes Relative 5 (*) 12 - 46 %    Lymphs Abs 0.7  0.7 - 4.0 K/uL    Monocytes Relative 5  3 - 12 %    Monocytes Absolute 0.7  0.1 - 1.0 K/uL    Eosinophils Relative 0  0 - 5 %    Eosinophils Absolute 0.0  0.0 - 0.7 K/uL    Basophils Relative 0  0 - 1 %    Basophils Absolute 0.0  0.0 - 0.1 K/uL   MRSA PCR SCREENING     Status: Normal   Collection Time   06/26/12  2:30 AM      Component Value Range Comment   MRSA by PCR NEGATIVE  NEGATIVE   BASIC METABOLIC PANEL     Status: Abnormal   Collection Time   06/26/12  5:13 AM      Component Value Range Comment   Sodium 134 (*) 135 - 145 mEq/L    Potassium 3.8  3.5 - 5.1 mEq/L    Chloride 95 (*) 96 - 112 mEq/L    CO2 28  19 - 32 mEq/L    Glucose, Bld 181 (*) 70 - 99 mg/dL    BUN 17  6 - 23 mg/dL    Creatinine, Ser 2.13 (*) 0.50 - 1.10 mg/dL    Calcium 8.6  8.4 - 08.6 mg/dL    GFR calc non Af Amer 8 (*) >90 mL/min    GFR calc Af Amer 9 (*) >90 mL/min   CBC     Status: Abnormal   Collection Time   06/26/12  5:13 AM      Component Value Range Comment   WBC 11.9 (*) 4.0 - 10.5 K/uL    RBC 3.75 (*) 3.87 - 5.11 MIL/uL    Hemoglobin 11.9 (*) 12.0 - 15.0 g/dL    HCT 57.8  46.9 - 62.9 %    MCV 99.2  78.0 - 100.0 fL  MCH 31.7  26.0 - 34.0 pg    MCHC 32.0  30.0 - 36.0 g/dL    RDW 40.9  81.1 - 91.4 %    Platelets 146 (*) 150  - 400 K/uL   GLUCOSE, CAPILLARY     Status: Abnormal   Collection Time   06/26/12 12:20 PM      Component Value Range Comment   Glucose-Capillary 120 (*) 70 - 99 mg/dL   GLUCOSE, CAPILLARY     Status: Abnormal   Collection Time   06/26/12  4:59 PM      Component Value Range Comment   Glucose-Capillary 148 (*) 70 - 99 mg/dL   GLUCOSE, CAPILLARY     Status: Abnormal   Collection Time   06/26/12  9:10 PM      Component Value Range Comment   Glucose-Capillary 205 (*) 70 - 99 mg/dL   CBC     Status: Abnormal   Collection Time   06/27/12  5:30 AM      Component Value Range Comment   WBC 10.0  4.0 - 10.5 K/uL    RBC 3.91  3.87 - 5.11 MIL/uL    Hemoglobin 12.4  12.0 - 15.0 g/dL    HCT 78.2  95.6 - 21.3 %    MCV 97.7  78.0 - 100.0 fL    MCH 31.7  26.0 - 34.0 pg    MCHC 32.5  30.0 - 36.0 g/dL    RDW 08.6  57.8 - 46.9 %    Platelets 149 (*) 150 - 400 K/uL   BASIC METABOLIC PANEL     Status: Abnormal   Collection Time   06/27/12  5:30 AM      Component Value Range Comment   Sodium 134 (*) 135 - 145 mEq/L    Potassium 3.8  3.5 - 5.1 mEq/L    Chloride 94 (*) 96 - 112 mEq/L    CO2 27  19 - 32 mEq/L    Glucose, Bld 148 (*) 70 - 99 mg/dL    BUN 26 (*) 6 - 23 mg/dL    Creatinine, Ser 6.29 (*) 0.50 - 1.10 mg/dL    Calcium 9.2  8.4 - 52.8 mg/dL    GFR calc non Af Amer 5 (*) >90 mL/min    GFR calc Af Amer 6 (*) >90 mL/min   HEMOGLOBIN A1C     Status: Abnormal   Collection Time   06/27/12  5:30 AM      Component Value Range Comment   Hemoglobin A1C 7.0 (*) <5.7 %    Mean Plasma Glucose 154 (*) <117 mg/dL   LIPID PANEL     Status: Normal   Collection Time   06/27/12  5:30 AM      Component Value Range Comment   Cholesterol 133  0 - 200 mg/dL    Triglycerides 413  <244 mg/dL    HDL 67  >01 mg/dL    Total CHOL/HDL Ratio 2.0      VLDL 23  0 - 40 mg/dL    LDL Cholesterol 43  0 - 99 mg/dL   GLUCOSE, CAPILLARY     Status: Abnormal   Collection Time   06/27/12  7:49 AM      Component Value  Range Comment   Glucose-Capillary 141 (*) 70 - 99 mg/dL   GLUCOSE, CAPILLARY     Status: Abnormal   Collection Time   06/27/12  9:16 AM      Component Value Range  Comment   Glucose-Capillary 155 (*) 70 - 99 mg/dL   GLUCOSE, CAPILLARY     Status: Abnormal   Collection Time   06/27/12 11:32 AM      Component Value Range Comment   Glucose-Capillary 143 (*) 70 - 99 mg/dL    Comment 1 Documented in Chart      Comment 2 Notify RN      Dg Chest 2 View  06/26/2012  *RADIOLOGY REPORT*  Clinical Data: Left-sided rib fractures.  Pneumothorax.  CHEST - 2 VIEW  Comparison: Chest x-ray 06/25/2012.  Findings: There is a tiny persistent left apical pneumothorax (less than 5% of the volume of the left hemithorax).  Extensive bilateral lower lobe interstitial and airspace disease, concerning for sequelae of aspiration.  Trace bilateral pleural effusions. Pulmonary venous congestion without frank pulmonary edema. Cardiomegaly is unchanged. The patient is rotated to the right on today's exam, resulting in distortion of the mediastinal contours and reduced diagnostic sensitivity and specificity for mediastinal pathology.  Atherosclerosis of the thoracic aorta.  Multiple left- sided rib fractures again noted.  Vascular stent in the left upper extremity soft tissues.  IMPRESSION: 1.  Multiple left-sided rib fractures with a tiny (decreasing) left apical pneumothorax. 2.  Bibasilar interstitial and airspace disease concerning for sequela of aspiration. 3.  Cardiomegaly is unchanged. 4.  Atherosclerosis.   Original Report Authenticated By: Trudie Reed, M.D.    Ct Chest W Contrast  06/25/2012  *RADIOLOGY REPORT*  Clinical Data:  The patient fell today and complains of left rib pain.  Left upper quadrant pain.  CT CHEST, ABDOMEN AND PELVIS WITH CONTRAST  Technique:  Multidetector CT imaging of the chest, abdomen and pelvis was performed following the standard protocol during bolus administration of intravenous contrast.   Contrast: OMNIPAQUE IOHEXOL 300 MG/ML  SOLN  Comparison:   None.  CT CHEST  Findings:  Mild cardiac enlargement.  Coronary artery calcification.  Normal caliber thoracic aorta with calcification. No significant mediastinal lymphadenopathy.  The esophagus is decompressed.  Small esophageal hiatal hernia.  There is a small left pleural effusion with small left pneumothorax.  There is a fibrosis or atelectasis in the lung bases with bronchial wall thickening suggesting chronic bronchitis. Airways appear patent.  Normal alignment of the thoracic spine without vertebral compression deformity.  The sternum appears intact.  Comminuted fractures of the left posterior and lateral seventh and eighth ribs with fractures of multiple locations and depression of fractures of the seventh rib posteriorly.  IMPRESSION: Comminuted and displaced fractures of the left seventh and eighth ribs with small left pleural effusion and small left pneumothorax. Consolidation or atelectasis in the lung bases with chronic bronchitic changes.  CT ABDOMEN AND PELVIS  Findings:  Normal alignment of the lumbar vertebrae without compression deformity.  Degenerative changes in the lumbar spine with degenerative disc disease.  The sacrum, pelvis, and hips appear intact.  The liver, spleen, gallbladder, adrenal glands, and retroperitoneal lymph nodes are unremarkable.  There is a circumscribed appearing cystic lesion in the tail of the pancreas measuring about 1.6 cm diameter.  This could represent a simple cyst or cystic neoplasm. 24-month follow-up study is recommended.  Small accessory spleen. The kidneys are atrophic without evidence of solid mass or hydronephrosis.  Calcification of the aorta without aneurysm.  The stomach, small bowel, and colon are not abnormally distended and there is no appreciable wall thickening.  No free air or free fluid in the abdomen.  No mesenteric or retroperitoneal fluid collections.  Pelvis:  The bladder wall is  not thickened.  The uterus appears to be surgically absent.  No abnormal adnexal masses.  No free or loculated pelvic fluid collections.  No evidence of diverticulitis. The appendix is not identified.  Abdominal wall musculature appears intact.  IMPRESSION: No acute post-traumatic changes demonstrated in the abdomen or pelvis.  No evidence of solid organ injury or bowel perforation. There is incidental note of a 1.6 cm diameter cystic lesion in the tail of the pancreas.  52-month follow-up is recommended.  Results were discussed by telephone at 2157 hours on 06/25/2012 with Dr. Ambrose Mantle.   Original Report Authenticated By: Burman Nieves, M.D.    Ct Abdomen Pelvis W Contrast  06/25/2012  *RADIOLOGY REPORT*  Clinical Data:  The patient fell today and complains of left rib pain.  Left upper quadrant pain.  CT CHEST, ABDOMEN AND PELVIS WITH CONTRAST  Technique:  Multidetector CT imaging of the chest, abdomen and pelvis was performed following the standard protocol during bolus administration of intravenous contrast.  Contrast: OMNIPAQUE IOHEXOL 300 MG/ML  SOLN  Comparison:   None.  CT CHEST  Findings:  Mild cardiac enlargement.  Coronary artery calcification.  Normal caliber thoracic aorta with calcification. No significant mediastinal lymphadenopathy.  The esophagus is decompressed.  Small esophageal hiatal hernia.  There is a small left pleural effusion with small left pneumothorax.  There is a fibrosis or atelectasis in the lung bases with bronchial wall thickening suggesting chronic bronchitis. Airways appear patent.  Normal alignment of the thoracic spine without vertebral compression deformity.  The sternum appears intact.  Comminuted fractures of the left posterior and lateral seventh and eighth ribs with fractures of multiple locations and depression of fractures of the seventh rib posteriorly.  IMPRESSION: Comminuted and displaced fractures of the left seventh and eighth ribs with small left pleural  effusion and small left pneumothorax. Consolidation or atelectasis in the lung bases with chronic bronchitic changes.  CT ABDOMEN AND PELVIS  Findings:  Normal alignment of the lumbar vertebrae without compression deformity.  Degenerative changes in the lumbar spine with degenerative disc disease.  The sacrum, pelvis, and hips appear intact.  The liver, spleen, gallbladder, adrenal glands, and retroperitoneal lymph nodes are unremarkable.  There is a circumscribed appearing cystic lesion in the tail of the pancreas measuring about 1.6 cm diameter.  This could represent a simple cyst or cystic neoplasm. 3-month follow-up study is recommended.  Small accessory spleen. The kidneys are atrophic without evidence of solid mass or hydronephrosis.  Calcification of the aorta without aneurysm.  The stomach, small bowel, and colon are not abnormally distended and there is no appreciable wall thickening.  No free air or free fluid in the abdomen.  No mesenteric or retroperitoneal fluid collections.  Pelvis:  The bladder wall is not thickened.  The uterus appears to be surgically absent.  No abnormal adnexal masses.  No free or loculated pelvic fluid collections.  No evidence of diverticulitis. The appendix is not identified.  Abdominal wall musculature appears intact.  IMPRESSION: No acute post-traumatic changes demonstrated in the abdomen or pelvis.  No evidence of solid organ injury or bowel perforation. There is incidental note of a 1.6 cm diameter cystic lesion in the tail of the pancreas.  83-month follow-up is recommended.  Results were discussed by telephone at 2157 hours on 06/25/2012 with Dr. Ambrose Mantle.   Original Report Authenticated By: Burman Nieves, M.D.    Dg Chest Portable 1 View  06/25/2012  *RADIOLOGY  REPORT*  Clinical Data: Fall.  Left rib pain.  PORTABLE CHEST - 1 VIEW  Comparison: 09/28/2011  Findings: Multiple left-sided rib fractures.  This involves ribs six through eight.  There appear to be fractures  both posteriorly and laterally of ribs seven and eight.  There is question of a small left apical pneumothorax.  Cardiomegaly.  Bibasilar atelectasis and small left pleural effusion.  IMPRESSION: Multiple left-sided rib fractures as above.  Small associated left pleural effusion.  Suspect tiny left apical pneumothorax.  Cardiomegaly.   Original Report Authenticated By: Charlett Nose, M.D.     Review of Systems  Constitutional: Negative for fever.  Respiratory: Positive for shortness of breath.        Left rib fxs from recent fall  Cardiovascular: Positive for chest pain.       Left chest pain after rib fxs post fall  Gastrointestinal: Negative for nausea, vomiting and abdominal pain.  Neurological: Positive for weakness.    Blood pressure 176/84, pulse 66, temperature 97.7 F (36.5 C), temperature source Oral, resp. rate 18, height 5\' 3"  (1.6 m), weight 156 lb 1.4 oz (70.8 kg), SpO2 92.00%. Physical Exam  Constitutional: She is oriented to person, place, and time.  Cardiovascular: Normal rate, regular rhythm and normal heart sounds.   No murmur heard. Respiratory: Effort normal and breath sounds normal. She has no wheezes.  GI: Soft. Bowel sounds are normal. There is tenderness.  Musculoskeletal: Normal range of motion.       Uses cane LUE upper arm graft; sl pulse distally  Neurological: She is alert and oriented to person, place, and time.  Psychiatric: She has a normal mood and affect. Her behavior is normal. Judgment and thought content normal.     Assessment/Plan Left upper arm dialysis graft clotted Scheduled for thrombolysis with poss pta/stent in am Pt aware of procedure benefits and risks and agreeable to proceed Consent signed and in chart  Semaje Kinker A 06/27/2012, 1:56 PM

## 2012-06-27 NOTE — H&P (Signed)
Agree 

## 2012-06-27 NOTE — Progress Notes (Signed)
Physical Therapy Treatment Patient Details Name: Claudia Rich MRN: [id number] DOB: [birth year] Today's Date: 06/27/2012 Time: 4098-1191 PT Time Calculation (min): 34 min  PT Assessment / Plan / Recommendation Comments on Treatment Session  Admitted post fall resulting in painful rib fxs and pneumothorax; Improved mobility over yesterday's eval  with premedication for pain, but still quite limited mobility and decr activity tol with incr BP and decr O2 sats    Follow Up Recommendations  Post acute inpatient     Does the patient have the potential to tolerate intense rehabilitation  No, Recommend SNF  Barriers to Discharge        Equipment Recommendations  Tub/shower seat    Recommendations for Other Services OT consult  Frequency Min 3X/week   Plan Discharge plan remains appropriate    Precautions / Restrictions Precautions Precautions: Fall Precaution Comments: Also very painful L rib fxs Restrictions Weight Bearing Restrictions: No Other Position/Activity Restrictions: Encourage deep breathing; BP becomes quite elevated with incr pain with activity]   Pertinent Vitals/Pain 10+/10 with mobility; though seems improved with premedication for pain as pt wasn't crying during session  BP incr to 216/92 post transfer to chair; O2 sats 89% lowest observed, but did incr to 94% with seated rest    Mobility  Bed Mobility Bed Mobility: Supine to Sit Supine to Sit: 3: Mod assist;With rails Details for Bed Mobility Assistance: Cues for technique, and to push through less painful right side/RUE to sit up; Got up on Right side of bed in hopes of minimizing pain Transfers Transfers: Sit to Stand;Stand to Sit;Stand Pivot Transfers Sit to Stand: 3: Mod assist;From bed;From chair/3-in-1;With upper extremity assist Stand to Sit: 4: Min assist;To chair/3-in-1;3: Mod assist Stand Pivot Transfers: 4: Min assist Details for Transfer Assistance: Cues for technique; light mod assist for sit to  stand, but did require some anti-gravity assistance; Stood to [employer] Ambulation/Gait Ambulation/Gait Assistance: 4: Min Environmental consultant (Feet): 2 Feet (pivot steps bed to recliner) Assistive device: Rolling walker Ambulation/Gait Assistance Details: Assist mostly for RW management and truning for optimal positioning prior to sitting down into chair Gait Pattern: Decreased step length - right;Decreased step length - left    Exercises Other Exercises Other Exercises: Practiced deep breathing with pillow under LUE to splint   PT Diagnosis:    PT Problem List:   PT Treatment Interventions:     PT Goals Acute Rehab PT Goals Time For Goal Achievement: 07/10/12 Potential to Achieve Goals: Good Pt will go Supine/Side to Sit: with modified independence PT Goal: Supine/Side to Sit - Progress: Progressing toward goal Pt will go Sit to Stand: with modified independence PT Goal: Sit to Stand - Progress: Progressing toward goal Pt will go Stand to Sit: with modified independence PT Goal: Stand to Sit - Progress: Progressing toward goal Pt will Transfer Bed to Chair/Chair to Bed: with modified independence PT Transfer Goal: Bed to Chair/Chair to Bed - Progress: Progressing toward goal Pt will Ambulate: >150 feet;with modified independence;with rolling walker PT Goal: Ambulate - Progress: Progressing toward goal (very slow progress, still quie limited by pain) Pt will Perform Home Exercise Program: Independently PT Goal: Perform Home Exercise Program - Progress: Progressing toward goal  Visit Information  Last PT Received On: 06/27/12 Assistance Needed: +1    Subjective Data  Subjective: Agreeable to OOB; pt reports she would like to go to her sister's home in [place] Patient Stated Goal: Would like to stay with her sister   Cognition  Overall  Cognitive Status: Appears within functional limits for tasks assessed/performed Arousal/Alertness: Awake/alert Orientation Level: Appears  intact for tasks assessed Behavior During Session: Anxious Cognition - Other Comments: Anxiety secondary to pain    Balance     End of Session PT - End of Session Activity Tolerance: Patient limited by pain (Improved acitivity tol with premedication for pain) Patient left: in chair;with call [place]/phone within reach Nurse Communication: Mobility status (and elevated BP with activity ; RN notifying MD)   GP Functional Assessment Tool Used: Clinical judgement Functional Limitation: Mobility: Walking and moving around Mobility: Walking and Moving Around Current Status ([id number]): At least 20 percent but less than 40 percent impaired, limited or restricted Mobility: Walking and Moving Around Goal Status [phone]): At least 1 percent but less than 20 percent impaired, limited or restricted   Van Clines [facility] [place], [place] [phone]  06/27/2012, 11:40 AM

## 2012-06-27 NOTE — Consult Note (Signed)
[facility] Renal Consultation Note  Indication for Consultation:  Management of ESRD/hemodialysis; anemia, hypertension/volume and secondary hyperparathyroidism  HPI: Claudia Rich is a 78 y.o. female with ESRD on dialysis at the [facility] who returned home after treatment on 11/2 with some nausea and abdominal pain and on her way to the bathroom fell against a table, resulting in left-side chest pain with deep inspirations or cough.  She denies any loss of consciousness, but is uncertain if she felt dizzy or lightheaded.  She arrived at the ED per EMS and was found to have multiple left rib fractures and small left pneumothorax.  During her dialysis earlier in the afternoon she achieved her ultrafiltration goal of 1.7 L, and her blood pressure remained stable with post-dialysis systolic pressures in the 130s sitting and standing.  Currently she has significant left side pain with movement, but on examination her AVG at her left upper arm had no bruit or thrill.  Interventional Radiology evaluated her and agreed to declot her access tomorrow morning, since she had already eaten lunch.  Dialysis Orders: Center: [facility] on TTS. EDW 68.5 kg  HD Bath 2K/2.25Ca  Time 3 hrs 45 mins  Heparin 9000 U. Access AVG @ LUA  BFR 400 DFR A1.5    Zemplar 4 mcg IV/HD  Epogen 2600 Units IV/HD  Venofer  100 mg on Thurs.   Past Medical History  Diagnosis Date  . Hypertension   . Diabetes mellitus   . Hyperlipidemia   . GERD (gastroesophageal reflux disease)   . Glaucoma   . Hammer toe   . Internal hemorrhoids   . Diverticulosis   . Colon polyps   . Peripheral vascular disease   . Anemia   . COPD (chronic obstructive pulmonary disease)   . Stroke 10/2010  . Myocardial infarction 10/2010  . Chronic kidney disease     Tues, Thurs, Sat @ [address]    Past Surgical History  Procedure Date  . Av fistula repair 02/16/2011    Revision of left brachiocephalic arteriovenous  fistula with patch angioplasty of left brachiocephalic fistula  . Av fistula placement, brachiocephalic 09/24/2010    Left  . Av fistula placement   . Cataract extraction   . Abdominal hysterectomy   . Av fistula placement 08/14/2011    Procedure: ARTERIOVENOUS (AV) FISTULA CREATION;  Surgeon: Chuck Hint, MD;  Location: [facility] OR;  Service: Vascular;  Laterality: Right;  Right Arm Arteriovenous Fistula Creation/ contact pt's niece,[name] for any information to be relayed to pt: # [phone]  . Insertion of dialysis catheter 09/28/2011    Procedure: INSERTION OF DIALYSIS CATHETER;  Surgeon: Sherren Kerns, MD;  Location: [facility] OR;  Service: Vascular;  Laterality: Left;  Insertion of Dialysis Catheter left IJ  . Eye surgery   . Av fistula placement 12/07/2011    Procedure: INSERTION OF ARTERIOVENOUS (AV) GORE-TEX GRAFT ARM;  Surgeon: Sherren Kerns, MD;  Location: [facility] OR;  Service: Vascular;  Laterality: Left;   Family History  Problem Relation Age of Onset  . Anesthesia problems Neg Hx    Social History She denies any history of alcohol, tobacco, or illicit drugs.  She was married years ago, but has lived alone since her husband died.  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  Allergies  Allergen Reactions  . Aspirin     Upset stomach  . Codeine Sulfate Other (See Comments)    unknown  .  Meperidine Hcl     REACTION: vomiting   Prior to Admission medications   Medication Sig Start Date End Date Taking? Authorizing Provider  albuterol (PROVENTIL HFA;VENTOLIN HFA) 108 (90 BASE) MCG/ACT inhaler Inhale 2 puffs into the lungs every 6 (six) hours as needed. Shortness of breath   Yes Historical Provider, MD  amLODipine (NORVASC) 10 MG tablet Take 1 tablet (10 mg total) by mouth daily. 05/10/12  Yes Dayarmys Pil[place] de Criselda Peaches, MD  aspirin 81 MG chewable tablet Chew 81 mg by mouth daily.   Yes Historical Provider, MD  atorvastatin (LIPITOR) 80 MG  tablet Take 1 tablet (80 mg total) by mouth daily. 05/10/12  Yes Dayarmys [place] de Criselda Peaches, MD  B Complex-C-Folic Acid (RENAL MULTIVITAMIN FORMULA PO) Take 1 tablet by mouth daily.    Yes Historical Provider, MD  calcium acetate (PHOSLO) 667 MG capsule Take 667 mg by mouth 2 (two) times daily.     Yes Historical Provider, MD  glipiZIDE (GLUCOTROL) 10 MG tablet Take 2 tablets (20 mg total) by mouth 2 (two) times daily before a meal. 05/10/12  Yes Dayarmys [place] de Criselda Peaches, MD  metoprolol tartrate (LOPRESSOR) 25 MG tablet Take 1 tablet (25 mg total) by mouth See admin instructions. Take 1 tablet twice daily on non-dialysis days. Take 1 tablet in the evening on Tuesday, Thursday and Saturday. 05/10/12  Yes Dayarmys [place] de Criselda Peaches, MD  ondansetron (ZOFRAN) 4 MG tablet Take 4 mg by mouth every 8 (eight) hours as needed. For nausea.   Yes Historical Provider, MD  ranitidine (ZANTAC) 150 MG tablet Take 1 tablet (150 mg total) by mouth 2 (two) times daily. 05/10/12  Yes Dayarmys Pil[place] de Criselda Peaches, MD  sitaGLIPtin (JANUVIA) 25 MG tablet Take 1 tablet (25 mg total) by mouth daily. 05/10/12  Yes Dayarmys Pil[place] de Criselda Peaches, MD   Labs:  Results for orders placed during the hospital encounter of 06/25/12 (from the past 48 hour(s))  CBC WITH DIFFERENTIAL     Status: Abnormal   Collection Time   06/25/12  9:40 PM      Component Value Range Comment   WBC 10.4  4.0 - 10.5 K/uL    RBC 2.65 (*) 3.87 - 5.11 MIL/uL    Hemoglobin 8.5 (*) 12.0 - 15.0 g/dL    HCT 16.1 (*) 09.6 - 46.0 %    MCV 101.1 (*) 78.0 - 100.0 fL    MCH 32.1  26.0 - 34.0 pg    MCHC 31.7  30.0 - 36.0 g/dL    RDW 04.5  40.9 - 81.1 %    Platelets 100 (*) 150 - 400 K/uL PLATELET COUNT CONFIRMED BY SMEAR   Neutrophils Relative 90 (*) 43 - 77 %    Neutro Abs 9.3 (*) 1.7 - 7.7 K/uL    Lymphocytes Relative 3 (*) 12 - 46 %    Lymphs Abs 0.4 (*) 0.7 - 4.0 K/uL    Monocytes Relative 7  3 - 12 %    Monocytes Absolute 0.7  0.1 - 1.0 K/uL    Eosinophils  Relative 0  0 - 5 %    Eosinophils Absolute 0.0  0.0 - 0.7 K/uL    Basophils Relative 0  0 - 1 %    Basophils Absolute 0.0  0.0 - 0.1 K/uL   BASIC METABOLIC PANEL     Status: Abnormal   Collection Time   06/25/12  9:40 PM      Component Value  Range Comment   Sodium 143  135 - 145 mEq/L    Potassium <2.0 (*) 3.5 - 5.1 mEq/L    Chloride 115 (*) 96 - 112 mEq/L    CO2 19  19 - 32 mEq/L    Glucose, Bld 177 (*) 70 - 99 mg/dL    BUN 9  6 - 23 mg/dL    Creatinine, Ser 1.61 (*) 0.50 - 1.10 mg/dL    Calcium 5.1 (*) 8.4 - 10.5 mg/dL    GFR calc non Af Amer 19 (*) >90 mL/min    GFR calc Af Amer 22 (*) >90 mL/min   PROTIME-INR     Status: Abnormal   Collection Time   06/25/12  9:40 PM      Component Value Range Comment   Prothrombin Time 17.5 (*) 11.6 - 15.2 seconds    INR 1.48  0.00 - 1.49   APTT     Status: Normal   Collection Time   06/25/12  9:40 PM      Component Value Range Comment   aPTT 34  24 - 37 seconds   BASIC METABOLIC PANEL     Status: Abnormal   Collection Time   06/25/12 11:18 PM      Component Value Range Comment   Sodium 133 (*) 135 - 145 mEq/L DELTA CHECK NOTED   Potassium 3.3 (*) 3.5 - 5.1 mEq/L    Chloride 94 (*) 96 - 112 mEq/L    CO2 26  19 - 32 mEq/L    Glucose, Bld 237 (*) 70 - 99 mg/dL    BUN 13  6 - 23 mg/dL    Creatinine, Ser 0.96 (*) 0.50 - 1.10 mg/dL    Calcium 8.5  8.4 - 04.5 mg/dL    GFR calc non Af Amer 10 (*) >90 mL/min    GFR calc Af Amer 12 (*) >90 mL/min   CBC WITH DIFFERENTIAL     Status: Abnormal   Collection Time   06/25/12 11:18 PM      Component Value Range Comment   WBC 13.5 (*) 4.0 - 10.5 K/uL    RBC 3.81 (*) 3.87 - 5.11 MIL/uL    Hemoglobin 12.2  12.0 - 15.0 g/dL    HCT 40.9  81.1 - 91.4 %    MCV 100.0  78.0 - 100.0 fL    MCH 32.0  26.0 - 34.0 pg    MCHC 32.0  30.0 - 36.0 g/dL    RDW 78.2  95.6 - 21.3 %    Platelets 137 (*) 150 - 400 K/uL    Neutrophils Relative 90 (*) 43 - 77 %    Neutro Abs 12.2 (*) 1.7 - 7.7 K/uL    Lymphocytes  Relative 5 (*) 12 - 46 %    Lymphs Abs 0.7  0.7 - 4.0 K/uL    Monocytes Relative 5  3 - 12 %    Monocytes Absolute 0.7  0.1 - 1.0 K/uL    Eosinophils Relative 0  0 - 5 %    Eosinophils Absolute 0.0  0.0 - 0.7 K/uL    Basophils Relative 0  0 - 1 %    Basophils Absolute 0.0  0.0 - 0.1 K/uL   MRSA PCR SCREENING     Status: Normal   Collection Time   06/26/12  2:30 AM      Component Value Range Comment   MRSA by PCR NEGATIVE  NEGATIVE   BASIC METABOLIC PANEL  Status: Abnormal   Collection Time   06/26/12  5:13 AM      Component Value Range Comment   Sodium 134 (*) 135 - 145 mEq/L    Potassium 3.8  3.5 - 5.1 mEq/L    Chloride 95 (*) 96 - 112 mEq/L    CO2 28  19 - 32 mEq/L    Glucose, Bld 181 (*) 70 - 99 mg/dL    BUN 17  6 - 23 mg/dL    Creatinine, Ser 2.13 (*) 0.50 - 1.10 mg/dL    Calcium 8.6  8.4 - 08.6 mg/dL    GFR calc non Af Amer 8 (*) >90 mL/min    GFR calc Af Amer 9 (*) >90 mL/min   CBC     Status: Abnormal   Collection Time   06/26/12  5:13 AM      Component Value Range Comment   WBC 11.9 (*) 4.0 - 10.5 K/uL    RBC 3.75 (*) 3.87 - 5.11 MIL/uL    Hemoglobin 11.9 (*) 12.0 - 15.0 g/dL    HCT 57.8  46.9 - 62.9 %    MCV 99.2  78.0 - 100.0 fL    MCH 31.7  26.0 - 34.0 pg    MCHC 32.0  30.0 - 36.0 g/dL    RDW 52.8  41.3 - 24.4 %    Platelets 146 (*) 150 - 400 K/uL   GLUCOSE, CAPILLARY     Status: Abnormal   Collection Time   06/26/12 12:20 PM      Component Value Range Comment   Glucose-Capillary 120 (*) 70 - 99 mg/dL   GLUCOSE, CAPILLARY     Status: Abnormal   Collection Time   06/26/12  4:59 PM      Component Value Range Comment   Glucose-Capillary 148 (*) 70 - 99 mg/dL   GLUCOSE, CAPILLARY     Status: Abnormal   Collection Time   06/26/12  9:10 PM      Component Value Range Comment   Glucose-Capillary 205 (*) 70 - 99 mg/dL   CBC     Status: Abnormal   Collection Time   06/27/12  5:30 AM      Component Value Range Comment   WBC 10.0  4.0 - 10.5 K/uL    RBC 3.91   3.87 - 5.11 MIL/uL    Hemoglobin 12.4  12.0 - 15.0 g/dL    HCT 01.0  27.2 - 53.6 %    MCV 97.7  78.0 - 100.0 fL    MCH 31.7  26.0 - 34.0 pg    MCHC 32.5  30.0 - 36.0 g/dL    RDW 64.4  03.4 - 74.2 %    Platelets 149 (*) 150 - 400 K/uL   BASIC METABOLIC PANEL     Status: Abnormal   Collection Time   06/27/12  5:30 AM      Component Value Range Comment   Sodium 134 (*) 135 - 145 mEq/L    Potassium 3.8  3.5 - 5.1 mEq/L    Chloride 94 (*) 96 - 112 mEq/L    CO2 27  19 - 32 mEq/L    Glucose, Bld 148 (*) 70 - 99 mg/dL    BUN 26 (*) 6 - 23 mg/dL    Creatinine, Ser 5.95 (*) 0.50 - 1.10 mg/dL    Calcium 9.2  8.4 - 63.8 mg/dL    GFR calc non Af Amer 5 (*) >90 mL/min    GFR  calc Af Amer 6 (*) >90 mL/min   HEMOGLOBIN A1C     Status: Abnormal   Collection Time   06/27/12  5:30 AM      Component Value Range Comment   Hemoglobin A1C 7.0 (*) <5.7 %    Mean Plasma Glucose 154 (*) <117 mg/dL   LIPID PANEL     Status: Normal   Collection Time   06/27/12  5:30 AM      Component Value Range Comment   Cholesterol 133  0 - 200 mg/dL    Triglycerides 161  <096 mg/dL    HDL 67  >04 mg/dL    Total CHOL/HDL Ratio 2.0      VLDL 23  0 - 40 mg/dL    LDL Cholesterol 43  0 - 99 mg/dL   GLUCOSE, CAPILLARY     Status: Abnormal   Collection Time   06/27/12  7:49 AM      Component Value Range Comment   Glucose-Capillary 141 (*) 70 - 99 mg/dL   GLUCOSE, CAPILLARY     Status: Abnormal   Collection Time   06/27/12  9:16 AM      Component Value Range Comment   Glucose-Capillary 155 (*) 70 - 99 mg/dL   GLUCOSE, CAPILLARY     Status: Abnormal   Collection Time   06/27/12 11:32 AM      Component Value Range Comment   Glucose-Capillary 143 (*) 70 - 99 mg/dL    Comment 1 Documented in Chart      Comment 2 Notify RN      Constitutional: negative for chills, fatigue, fevers, sweats and weight loss Ears, nose, mouth, throat, and face: negative for hearing loss, hoarseness, nasal congestion and sore  throat Respiratory: positive for left-side chest pain with deep inspirations; negative for cough, dyspnea on exertion, hemoptysis, sputum and wheezing Cardiovascular: negative for dyspnea, exertional chest pressure/discomfort, lower extremity edema, orthopnea and palpitations Gastrointestinal: negative for abdominal pain, change in bowel habits, nausea and vomiting, but reports nausea sometimes after dialysis. Genitourinary:negative, non-oliguric Musculoskeletal:negative for arthralgias, back pain, muscle weakness and neck pain Neurological: negative for dizziness, gait problems, headaches and speech problems  Physical Exam: Filed Vitals:   06/27/12 1026  BP: 176/84  Pulse:   Temp:   Resp:      General appearance: alert, cooperative and no distress Head: Normocephalic, without obvious abnormality, atraumatic Neck: no adenopathy, no carotid bruit, no JVD and supple, symmetrical, trachea midline Resp: Poor effort, secondary to chest pain, but clear Chest wall: chest wall tenderness at left axilla Cardio: regular rate and rhythm, S1, S2 normal, no murmur, click, rub or gallop GI: soft, non-tender; bowel sounds normal; no masses,  no organomegaly Extremities: extremities normal, atraumatic, no cyanosis or edema Neurologic: Grossly normal Dialysis Access: AVG @ LUA without bruit or thrill   Assessment/Plan: 1. Multiple rib fractures/pneumothorax - secondary to fall on 11/2; CT showed comminuted and displaced fractures of the left 7th and 8th ribs with small (5%) L pneumothorax; currently stable on pain meds. 2. Clotted access - AVG @ LUA without bruit or thrill; evaluated by IR.  NPO after midnight for declot tomorrow AM. 3. ESRD -  HD on TTS @ [facility]; K 3.8 today. HD tomorrow after declot. 4. Hypertension/volume  - BP 167/79 on Norvasc 10 mg qhs and Metoprolol 25 mg bid; reached EDW of 68.5 kg with post-HD SBPs in 130s on 11/2, wt 70.8 yesterday.  5. Anemia  - Hgb 12.4 on outpatient Epogen  2600 U and weekly Fe.  Continue IV Fe on Thurs. 6. Metabolic bone disease - Ca 9.2, last P 3.4 and iPTH 233 on 10/17; on Zemplar 4 mcg, Phoslo 2 with meals and 1 with snacks. 7. Nutrition - Last Alb 3.8. 8. DM Type 2 - on Januvia and Glucotrol. 9. Hx CVA - TIA with AMS in 10/2010.  LYLES,CHARLES 06/27/2012, 1:10 PM   Attending Nephrologist: Delano Metz, MD  Patient seen and examined and agree with assessment and plan as above.  Vinson Moselle  MD [facility] [phone] pgr    [phone] cell 06/27/2012, 4:51 PM

## 2012-06-27 NOTE — Progress Notes (Signed)
[facility] PROGRESS NOTE  TATIONA STECH RUE:[id number] DOB: 1926/12/21 DOA: 06/25/2012 PCP: Lillia Abed, MD  Assessment/Plan: Traumatic pneumothorax  -PTX decreasing in size -daily CXR, supplemental oxygen -Trauma surgery following  -No distress or hypoxemia at this time  -Serial chest x-rays  -No syncope  Pulmonary infiltrate -Increasing basal opacities -Impaired moxifloxacin Closed rib fracture  -Continue opioids  End-stage renal disease  -Consult nephrology if patient stays until Tuesday  Hypertension  -Restart metoprolol and amlodipine  -Patient has very poor insight on her medications. I have concerns regarding patient taking her medications properly  Diabetes mellitus type 2  -Hold Januvia and glipizide for now due to procedure -NovoLog sliding scale  -Continue aspirin Clotted AV graft -Plains negative for IR to declot tomorrow    Family Communication: [name] (niece) [phone] updated today  Disposition Plan: Niece wants to take patient to [facility] unstable        Procedures/Studies: Dg Chest 2 View  06/26/2012  *RADIOLOGY REPORT*  Clinical Data: Left-sided rib fractures.  Pneumothorax.  CHEST - 2 VIEW  Comparison: Chest x-ray 06/25/2012.  Findings: There is a tiny persistent left apical pneumothorax (less than 5% of the volume of the left hemithorax).  Extensive bilateral lower lobe interstitial and airspace disease, concerning for sequelae of aspiration.  Trace bilateral pleural effusions. Pulmonary venous congestion without frank pulmonary edema. Cardiomegaly is unchanged. The patient is rotated to the right on today's exam, resulting in distortion of the mediastinal contours and reduced diagnostic sensitivity and specificity for mediastinal pathology.  Atherosclerosis of the thoracic aorta.  Multiple left- sided rib fractures again noted.  Vascular stent in the left upper extremity soft tissues.  IMPRESSION: 1.  Multiple left-sided rib fractures  with a tiny (decreasing) left apical pneumothorax. 2.  Bibasilar interstitial and airspace disease concerning for sequela of aspiration. 3.  Cardiomegaly is unchanged. 4.  Atherosclerosis.   Original Report Authenticated By: Trudie Reed, M.D.    Ct Chest W Contrast  06/25/2012  *RADIOLOGY REPORT*  Clinical Data:  The patient fell today and complains of left rib pain.  Left upper quadrant pain.  CT CHEST, ABDOMEN AND PELVIS WITH CONTRAST  Technique:  Multidetector CT imaging of the chest, abdomen and pelvis was performed following the standard protocol during bolus administration of intravenous contrast.  Contrast: OMNIPAQUE IOHEXOL 300 MG/ML  SOLN  Comparison:   None.  CT CHEST  Findings:  Mild cardiac enlargement.  Coronary artery calcification.  Normal caliber thoracic aorta with calcification. No significant mediastinal lymphadenopathy.  The esophagus is decompressed.  Small esophageal hiatal hernia.  There is a small left pleural effusion with small left pneumothorax.  There is a fibrosis or atelectasis in the lung bases with bronchial wall thickening suggesting chronic bronchitis. Airways appear patent.  Normal alignment of the thoracic spine without vertebral compression deformity.  The sternum appears intact.  Comminuted fractures of the left posterior and lateral seventh and eighth ribs with fractures of multiple locations and depression of fractures of the seventh rib posteriorly.  IMPRESSION: Comminuted and displaced fractures of the left seventh and eighth ribs with small left pleural effusion and small left pneumothorax. Consolidation or atelectasis in the lung bases with chronic bronchitic changes.  CT ABDOMEN AND PELVIS  Findings:  Normal alignment of the lumbar vertebrae without compression deformity.  Degenerative changes in the lumbar spine with degenerative disc disease.  The sacrum, pelvis, and hips appear intact.  The liver, spleen, gallbladder, adrenal glands, and retroperitoneal  lymph nodes are unremarkable.  There is a circumscribed appearing cystic lesion in the tail of the pancreas measuring about 1.6 cm diameter.  This could represent a simple cyst or cystic neoplasm. 26-month follow-up study is recommended.  Small accessory spleen. The kidneys are atrophic without evidence of solid mass or hydronephrosis.  Calcification of the aorta without aneurysm.  The stomach, small bowel, and colon are not abnormally distended and there is no appreciable wall thickening.  No free air or free fluid in the abdomen.  No mesenteric or retroperitoneal fluid collections.  Pelvis:  The bladder wall is not thickened.  The uterus appears to be surgically absent.  No abnormal adnexal masses.  No free or loculated pelvic fluid collections.  No evidence of diverticulitis. The appendix is not identified.  Abdominal wall musculature appears intact.  IMPRESSION: No acute post-traumatic changes demonstrated in the abdomen or pelvis.  No evidence of solid organ injury or bowel perforation. There is incidental note of a 1.6 cm diameter cystic lesion in the tail of the pancreas.  25-month follow-up is recommended.  Results were discussed by telephone at 2157 hours on 06/25/2012 with Dr. Ambrose Mantle.   Original Report Authenticated By: Burman Nieves, M.D.    Ct Abdomen Pelvis W Contrast  06/25/2012  *RADIOLOGY REPORT*  Clinical Data:  The patient fell today and complains of left rib pain.  Left upper quadrant pain.  CT CHEST, ABDOMEN AND PELVIS WITH CONTRAST  Technique:  Multidetector CT imaging of the chest, abdomen and pelvis was performed following the standard protocol during bolus administration of intravenous contrast.  Contrast: OMNIPAQUE IOHEXOL 300 MG/ML  SOLN  Comparison:   None.  CT CHEST  Findings:  Mild cardiac enlargement.  Coronary artery calcification.  Normal caliber thoracic aorta with calcification. No significant mediastinal lymphadenopathy.  The esophagus is decompressed.  Small esophageal  hiatal hernia.  There is a small left pleural effusion with small left pneumothorax.  There is a fibrosis or atelectasis in the lung bases with bronchial wall thickening suggesting chronic bronchitis. Airways appear patent.  Normal alignment of the thoracic spine without vertebral compression deformity.  The sternum appears intact.  Comminuted fractures of the left posterior and lateral seventh and eighth ribs with fractures of multiple locations and depression of fractures of the seventh rib posteriorly.  IMPRESSION: Comminuted and displaced fractures of the left seventh and eighth ribs with small left pleural effusion and small left pneumothorax. Consolidation or atelectasis in the lung bases with chronic bronchitic changes.  CT ABDOMEN AND PELVIS  Findings:  Normal alignment of the lumbar vertebrae without compression deformity.  Degenerative changes in the lumbar spine with degenerative disc disease.  The sacrum, pelvis, and hips appear intact.  The liver, spleen, gallbladder, adrenal glands, and retroperitoneal lymph nodes are unremarkable.  There is a circumscribed appearing cystic lesion in the tail of the pancreas measuring about 1.6 cm diameter.  This could represent a simple cyst or cystic neoplasm. 27-month follow-up study is recommended.  Small accessory spleen. The kidneys are atrophic without evidence of solid mass or hydronephrosis.  Calcification of the aorta without aneurysm.  The stomach, small bowel, and colon are not abnormally distended and there is no appreciable wall thickening.  No free air or free fluid in the abdomen.  No mesenteric or retroperitoneal fluid collections.  Pelvis:  The bladder wall is not thickened.  The uterus appears to be surgically absent.  No abnormal adnexal masses.  No free or loculated pelvic fluid collections.  No evidence of diverticulitis.  The appendix is not identified.  Abdominal wall musculature appears intact.  IMPRESSION: No acute post-traumatic changes  demonstrated in the abdomen or pelvis.  No evidence of solid organ injury or bowel perforation. There is incidental note of a 1.6 cm diameter cystic lesion in the tail of the pancreas.  37-month follow-up is recommended.  Results were discussed by telephone at 2157 hours on 06/25/2012 with Dr. Ambrose Mantle.   Original Report Authenticated By: Burman Nieves, M.D.    Dg Chest Portable 1 View  06/25/2012  *RADIOLOGY REPORT*  Clinical Data: Fall.  Left rib pain.  PORTABLE CHEST - 1 VIEW  Comparison: 09/28/2011  Findings: Multiple left-sided rib fractures.  This involves ribs six through eight.  There appear to be fractures both posteriorly and laterally of ribs seven and eight.  There is question of a small left apical pneumothorax.  Cardiomegaly.  Bibasilar atelectasis and small left pleural effusion.  IMPRESSION: Multiple left-sided rib fractures as above.  Small associated left pleural effusion.  Suspect tiny left apical pneumothorax.  Cardiomegaly.   Original Report Authenticated By: Charlett Nose, M.D.          Subjective: Patient continues to complain of left-sided rib pain. She denies any shortness of breath. No fevers or chills. No chest pain or shortness of breath. No nausea, vomiting, abdominal pain, diarrhea.  Objective: Filed Vitals:   06/27/12 0449 06/27/12 0936 06/27/12 1026 06/27/12 1403  BP: 172/67 195/84 176/84 167/79  Pulse: 71 66  64  Temp: 97.2 F (36.2 C) 97.7 F (36.5 C)  98.3 F (36.8 C)  TempSrc: Oral Oral  Oral  Resp: 18   18  Height:      Weight:      SpO2: 94% 92%  90%    Intake/Output Summary (Last 24 hours) at 06/27/12 1441 Last data filed at 06/27/12 0939  Gross per 24 hour  Intake    443 ml  Output      0 ml  Net    443 ml   Weight change: 1.5 kg (3 lb 4.9 oz) Exam:   General:  Pt is alert, follows commands appropriately, not in acute distress  HEENT: No icterus, No thrush,  [place]/AT  Cardiovascular: RRR, S1/S2, no rubs, no gallops  Respiratory: CTA  bilaterally, no wheezing, no crackles, no rhonchi  Abdomen: Soft/+BS, non tender, non distended, no guarding  Extremities: No edema, No lymphangitis, No petechiae, No rashes, no synovitis  Data Reviewed: Basic Metabolic Panel:  Lab 06/27/12 8469 06/26/12 0513 06/25/12 2318 06/25/12 2140  NA 134* 134* 133* 143  K 3.8 3.8 3.3* <2.0*  CL 94* 95* 94* 115*  CO2 27 28 26 19   GLUCOSE 148* 181* 237* 177*  BUN 26* 17 13 9   CREATININE 6.48* 4.60* 3.77* 2.27*  CALCIUM 9.2 8.6 8.5 5.1*  MG -- -- -- --  PHOS -- -- -- --   Liver Function Tests: No results found for this basename: AST:5,ALT:5,ALKPHOS:5,BILITOT:5,PROT:5,ALBUMIN:5 in the last 168 hours No results found for this basename: LIPASE:5,AMYLASE:5 in the last 168 hours No results found for this basename: AMMONIA:5 in the last 168 hours CBC:  Lab 06/27/12 0530 06/26/12 0513 06/25/12 2318 06/25/12 2140  WBC 10.0 11.9* 13.5* 10.4  NEUTROABS -- -- 12.2* 9.3*  HGB 12.4 11.9* 12.2 8.5*  HCT 38.2 37.2 38.1 26.8*  MCV 97.7 99.2 100.0 101.1*  PLT 149* 146* 137* 100*   Cardiac Enzymes: No results found for this basename: CKTOTAL:5,CKMB:5,CKMBINDEX:5,TROPONINI:5 in the last 168 hours BNP: No  components found with this basename: POCBNP:5 CBG:  Lab 06/27/12 1132 06/27/12 0916 06/27/12 0749 06/26/12 2110 06/26/12 1659  GLUCAP 143* 155* 141* 205* 148*    Recent Results (from the past 240 hour(s))  MRSA PCR SCREENING     Status: Normal   Collection Time   06/26/12  2:30 AM      Component Value Range Status Comment   MRSA by PCR NEGATIVE  NEGATIVE Final      Scheduled Meds:   . amLODipine  10 mg Oral Daily  . atorvastatin  80 mg Oral q1800  . calcium acetate  667 mg Oral BID  . insulin aspart  0-15 Units Subcutaneous TID WC  . insulin aspart  0-5 Units Subcutaneous QHS  . metoprolol tartrate  25 mg Oral BID  . sodium chloride  3 mL Intravenous Q12H  . sodium chloride  3 mL Intravenous Q12H   Continuous Infusions:    Claudia Rich,  Claudia Suhr, DO  [facility] Pager [phone]  If 7PM-7AM, please contact night-coverage [email or web address] Password [email or web address] 06/27/2012, 2:41 PM   LOS: 2 days

## 2012-06-28 ENCOUNTER — Encounter (HOSPITAL_COMMUNITY): Payer: Self-pay | Admitting: Anesthesiology

## 2012-06-28 ENCOUNTER — Observation Stay (HOSPITAL_COMMUNITY): Payer: Medicare HMO

## 2012-06-28 ENCOUNTER — Encounter (HOSPITAL_COMMUNITY)
Admission: EM | Disposition: A | Discharge status: Home / Self Care | Payer: Self-pay | Source: Home / Self Care | Attending: Emergency Medicine

## 2012-06-28 ENCOUNTER — Observation Stay (HOSPITAL_COMMUNITY): Payer: Medicare HMO | Admitting: Anesthesiology

## 2012-06-28 DIAGNOSIS — T82898A Other specified complication of vascular prosthetic devices, implants and grafts, initial encounter: Secondary | ICD-10-CM

## 2012-06-28 DIAGNOSIS — N186 End stage renal disease: Secondary | ICD-10-CM

## 2012-06-28 HISTORY — PX: THROMBECTOMY AND REVISION OF ARTERIOVENTOUS (AV) GORETEX  GRAFT: SHX6120

## 2012-06-28 LAB — GLUCOSE, CAPILLARY
Glucose-Capillary: 110 mg/dL — ABNORMAL HIGH (ref 70–99)
Glucose-Capillary: 105 mg/dL — ABNORMAL HIGH (ref 70–99)
Glucose-Capillary: 89 mg/dL (ref 70–99)
Glucose-Capillary: 140 mg/dL — ABNORMAL HIGH (ref 70–99)

## 2012-06-28 LAB — CBC
HCT: 36 % (ref 36.0–46.0)
Hemoglobin: 12 g/dL (ref 12.0–15.0)
Hemoglobin: 10.8 g/dL — ABNORMAL LOW (ref 12.0–15.0)
MCH: 32.4 pg (ref 26.0–34.0)
MCHC: 33.3 g/dL (ref 30.0–36.0)
MCHC: 32.2 g/dL (ref 30.0–36.0)
Platelets: 136 10*3/uL — ABNORMAL LOW (ref 150–400)
RDW: 14 % (ref 11.5–15.5)
RDW: 13.9 % (ref 11.5–15.5)

## 2012-06-28 LAB — RENAL FUNCTION PANEL
Albumin: 2.8 g/dL — ABNORMAL LOW (ref 3.5–5.2)
BUN: 36 mg/dL — ABNORMAL HIGH (ref 6–23)
Calcium: 9.1 mg/dL (ref 8.4–10.5)
GFR calc Af Amer: 4 mL/min — ABNORMAL LOW (ref >90)
GFR calc non Af Amer: 3 mL/min — ABNORMAL LOW (ref >90)
Glucose, Bld: 141 mg/dL — ABNORMAL HIGH (ref 70–99)
Phosphorus: 3.6 mg/dL (ref 2.3–4.6)
Phosphorus: 5 mg/dL — ABNORMAL HIGH (ref 2.3–4.6)
Potassium: 3.9 mEq/L (ref 3.5–5.1)
Sodium: 132 mEq/L — ABNORMAL LOW (ref 135–145)

## 2012-06-28 SURGERY — THROMBECTOMY AND REVISION OF ARTERIOVENTOUS (AV) GORETEX  GRAFT
Anesthesia: Monitor Anesthesia Care | Site: Arm Upper | Laterality: Left | Wound class: Clean

## 2012-06-28 MED ORDER — LIDOCAINE-EPINEPHRINE (PF) 1 %-1:200000 IJ SOLN
INTRAMUSCULAR | Status: DC | PRN
  Administered 2012-06-28: 20 mL

## 2012-06-28 MED ORDER — PROPOFOL INFUSION 10 MG/ML OPTIME
INTRAVENOUS | Status: DC | PRN
  Administered 2012-06-28: 25 ug/kg/min via INTRAVENOUS

## 2012-06-28 MED ORDER — SODIUM CHLORIDE 0.9 % IR SOLN
Status: DC | PRN
  Administered 2012-06-28: 15:00:00

## 2012-06-28 MED ORDER — HEPARIN SODIUM (PORCINE) 1000 UNIT/ML IJ SOLN
INTRAMUSCULAR | Status: DC | PRN
  Administered 2012-06-28: 6000 [IU] via INTRAVENOUS

## 2012-06-28 MED ORDER — ASPIRIN 81 MG PO CHEW
81.0000 mg | CHEWABLE_TABLET | Freq: Every day | ORAL | Status: DC
  Administered 2012-06-29 – 2012-07-01 (×4): 81 mg via ORAL
  Filled 2012-06-28 (×4): qty 1

## 2012-06-28 MED ORDER — DEXTROSE 5 % IV SOLN
1.5000 g | INTRAVENOUS | Status: AC
  Administered 2012-06-28: 1.5 g via INTRAVENOUS
  Filled 2012-06-28: qty 1.5

## 2012-06-28 MED ORDER — LIDOCAINE HCL (CARDIAC) 20 MG/ML IV SOLN
INTRAVENOUS | Status: DC | PRN
  Administered 2012-06-28: 40 mg via INTRAVENOUS

## 2012-06-28 MED ORDER — ONDANSETRON HCL 4 MG/2ML IJ SOLN
INTRAMUSCULAR | Status: DC | PRN
  Administered 2012-06-28: 4 mg via INTRAVENOUS

## 2012-06-28 MED ORDER — THROMBIN 20000 UNITS EX SOLR
CUTANEOUS | Status: AC
  Filled 2012-06-28: qty 20000

## 2012-06-28 MED ORDER — FENTANYL CITRATE 0.05 MG/ML IJ SOLN
INTRAMUSCULAR | Status: DC | PRN
  Administered 2012-06-28: 50 ug via INTRAVENOUS
  Administered 2012-06-28: 25 ug via INTRAVENOUS

## 2012-06-28 MED ORDER — LIDOCAINE-EPINEPHRINE (PF) 1 %-1:200000 IJ SOLN
INTRAMUSCULAR | Status: AC
  Filled 2012-06-28: qty 10

## 2012-06-28 MED ORDER — 0.9 % SODIUM CHLORIDE (POUR BTL) OPTIME
TOPICAL | Status: DC | PRN
  Administered 2012-06-28: 1000 mL

## 2012-06-28 MED ORDER — PHENYLEPHRINE HCL 10 MG/ML IJ SOLN
INTRAMUSCULAR | Status: DC | PRN
  Administered 2012-06-28 (×4): 80 ug via INTRAVENOUS

## 2012-06-28 SURGICAL SUPPLY — 50 items
ADH SKN CLS APL DERMABOND .7 (GAUZE/BANDAGES/DRESSINGS) ×2
BANDAGE ESMARK 6X9 LF (GAUZE/BANDAGES/DRESSINGS) IMPLANT
BNDG CMPR 9X6 STRL LF SNTH (GAUZE/BANDAGES/DRESSINGS) ×1
BNDG ESMARK 6X9 LF (GAUZE/BANDAGES/DRESSINGS) ×2
CANISTER SUCTION 2500CC (MISCELLANEOUS) ×2 IMPLANT
CATH EMB 4FR 80CM (CATHETERS) ×3 IMPLANT
CLIP TI MEDIUM 6 (CLIP) ×2 IMPLANT
CLIP TI WIDE RED SMALL 6 (CLIP) ×2 IMPLANT
CLOTH BEACON ORANGE TIMEOUT ST (SAFETY) ×2 IMPLANT
COVER SURGICAL LIGHT HANDLE (MISCELLANEOUS) ×2 IMPLANT
CUFF TOURNIQUET SINGLE 18IN (TOURNIQUET CUFF) ×1 IMPLANT
DERMABOND ADVANCED (GAUZE/BANDAGES/DRESSINGS) ×2
DERMABOND ADVANCED .7 DNX12 (GAUZE/BANDAGES/DRESSINGS) ×1 IMPLANT
DRAPE X-RAY CASS 24X20 (DRAPES) IMPLANT
ELECT REM PT RETURN 9FT ADLT (ELECTROSURGICAL) ×2
ELECTRODE REM PT RTRN 9FT ADLT (ELECTROSURGICAL) ×1 IMPLANT
GAUZE SPONGE 4X4 16PLY XRAY LF (GAUZE/BANDAGES/DRESSINGS) IMPLANT
GEL ULTRASOUND 20GR AQUASONIC (MISCELLANEOUS) IMPLANT
GLOVE BIO SURGEON STRL SZ7.5 (GLOVE) ×2 IMPLANT
GLOVE BIOGEL PI IND STRL 6.5 (GLOVE) IMPLANT
GLOVE BIOGEL PI IND STRL 7.5 (GLOVE) IMPLANT
GLOVE BIOGEL PI IND STRL 8 (GLOVE) ×1 IMPLANT
GLOVE BIOGEL PI INDICATOR 6.5 (GLOVE) ×1
GLOVE BIOGEL PI INDICATOR 7.5 (GLOVE) ×3
GLOVE BIOGEL PI INDICATOR 8 (GLOVE) ×1
GLOVE ECLIPSE 6.5 STRL STRAW (GLOVE) ×1 IMPLANT
GLOVE SS BIOGEL STRL SZ 7 (GLOVE) IMPLANT
GLOVE SUPERSENSE BIOGEL SZ 7 (GLOVE) ×1
GLOVE SURG SS PI 7.5 STRL IVOR (GLOVE) ×1 IMPLANT
GOWN STRL NON-REIN LRG LVL3 (GOWN DISPOSABLE) ×4 IMPLANT
GRAFT GORETEX STRT 7X10 (Vascular Products) ×1 IMPLANT
KIT BASIN OR (CUSTOM PROCEDURE TRAY) ×2 IMPLANT
KIT ROOM TURNOVER OR (KITS) ×2 IMPLANT
NS IRRIG 1000ML POUR BTL (IV SOLUTION) ×2 IMPLANT
PACK CV ACCESS (CUSTOM PROCEDURE TRAY) ×2 IMPLANT
PAD ARMBOARD 7.5X6 YLW CONV (MISCELLANEOUS) ×4 IMPLANT
PADDING CAST COTTON 6X4 STRL (CAST SUPPLIES) ×1 IMPLANT
SET COLLECT BLD 21X3/4 12 (NEEDLE) IMPLANT
SPONGE SURGIFOAM ABS GEL 100 (HEMOSTASIS) IMPLANT
STOPCOCK 4 WAY LG BORE MALE ST (IV SETS) IMPLANT
SUT PROLENE 6 0 BV (SUTURE) ×4 IMPLANT
SUT VIC AB 3-0 SH 27 (SUTURE) ×4
SUT VIC AB 3-0 SH 27X BRD (SUTURE) ×1 IMPLANT
SUT VICRYL 4-0 PS2 18IN ABS (SUTURE) ×3 IMPLANT
SYR 3ML LL SCALE MARK (SYRINGE) ×1 IMPLANT
TOWEL OR 17X24 6PK STRL BLUE (TOWEL DISPOSABLE) ×2 IMPLANT
TOWEL OR 17X26 10 PK STRL BLUE (TOWEL DISPOSABLE) ×2 IMPLANT
TUBING EXTENTION W/L.L. (IV SETS) IMPLANT
UNDERPAD 30X30 INCONTINENT (UNDERPADS AND DIAPERS) ×2 IMPLANT
WATER STERILE IRR 1000ML POUR (IV SOLUTION) ×2 IMPLANT

## 2012-06-28 NOTE — Transfer of Care (Signed)
Immediate Anesthesia Transfer of Care Note  Patient: Claudia Rich  Procedure(s) Performed: Procedure(s) (LRB) with comments: THROMBECTOMY AND REVISION OF ARTERIOVENTOUS (AV) GORETEX  GRAFT (Left)  Patient Location: PACU  Anesthesia Type:MAC  Level of Consciousness: awake, alert  and patient cooperative  Airway & Oxygen Therapy: Patient Spontanous Breathing  Post-op Assessment: Report given to PACU RN and Post -op Vital signs reviewed and stable  Post vital signs: Reviewed and stable  Complications: No apparent anesthesia complications

## 2012-06-28 NOTE — Progress Notes (Signed)
Subjective:  No complaints if still, but significant left-side chest pain with deep inspirations or movement; NPO for declot of AVG today.  Objective: Vital signs in last 24 hours: Temp:  [97.7 F (36.5 C)-98.4 F (36.9 C)] 98 F (36.7 C) (11/05 0636) Pulse Rate:  [64-70] 70  (11/05 0636) Resp:  [16-18] 18  (11/05 0636) BP: (156-195)/(72-84) 166/73 mmHg (11/05 0636) SpO2:  [90 %-96 %] 96 % (11/05 0636) Weight:  [70.8 kg (156 lb 1.4 oz)] 70.8 kg (156 lb 1.4 oz) (11/04 2049) Weight change: 0 kg (0 lb)  Intake/Output from previous day: 11/04 0701 - 11/05 0700 In: 440 [P.O.:420; I.V.:20] Out: 0    EXAM: General appearance:  Alert, in no apparent distress Resp:  Poor effort, secondary to pain, but clear Cardio:  RRR without murmur or rub GI:  + BS, soft and nontender Extremities:  No edema Access:  AVG @ LUA without bruit or thrill  Lab Results:  Basename 06/27/12 0530 06/26/12 0513  WBC 10.0 11.9*  HGB 12.4 11.9*  HCT 38.2 37.2  PLT 149* 146*   BMET:  Basename 06/27/12 0530 06/26/12 0513  NA 134* 134*  K 3.8 3.8  CL 94* 95*  CO2 27 28  GLUCOSE 148* 181*  BUN 26* 17  CREATININE 6.48* 4.60*  CALCIUM 9.2 8.6  ALBUMIN -- --   No results found for this basename: PTH:2 in the last 72 hours Iron Studies: No results found for this basename: IRON,TIBC,TRANSFERRIN,FERRITIN in the last 72 hours  Dialysis Orders: Center: [facility] on TTS.  EDW 68.5 kg HD Bath 2K/2.25Ca Time 3 hrs 45 mins Heparin 9000 U. Access AVG @ LUA BFR 400 DFR A1.5  Zemplar 4 mcg IV/HD Epogen 2600 Units IV/HD Venofer 100 mg on Thurs.   Assessment/Plan:  1. Multiple rib fractures/pneumothorax - secondary to fall on 11/2; CT showed comminuted and displaced fractures of the left 7th and 8th ribs with small (5%) L pneumothorax; currently stable on pain meds. 2. Clotted access - AVG @ LUA without bruit or thrill; VVS evaluation pending. NPO now for pending declot. 3. ESRD - HD on TTS @ [facility]; K 3.8 yesterday.  HD today after declot. 4. Hypertension/volume - BP 166/73 on Norvasc 10 mg qhs and Metoprolol 25 mg bid; reached EDW of 68.5 kg with post-HD SBPs in 130s on 11/2, wt 70.8 yesterday.  5. Anemia - Hgb 12.4 on outpatient Epogen 2600 U and weekly Fe. Continue IV Fe on Thurs. 6. Metabolic bone disease - Ca 9.2, last P 3.4 and iPTH 233 on 10/17; on Zemplar 4 mcg, Phoslo 2 with meals and 1 with snacks. 7. Nutrition - Last Alb 3.8. 8. DM Type 2 - on Januvia and Glucotrol. 9. Hx CVA - TIA with AMS in 10/2010.   LOS: 3 days   LYLES,CHARLES 06/28/2012,7:20 AM  Patient seen and examined and agree with assessment and plan as above.  Vinson Moselle  MD [employer] [phone] pgr    [phone] cell 06/28/2012, 2:08 PM

## 2012-06-28 NOTE — Op Note (Signed)
NAME: Claudia Rich    MRN: [id number] DOB: February 18, 1927    DATE OF OPERATION: 06/28/2012  PREOP DIAGNOSIS: clotted left upper arm AV graft  POSTOP DIAGNOSIS: same  PROCEDURE: thrombectomy and revision of left upper arm AV graft  SURGEON: Di Kindle. Edilia Bo, MD, FACS  ASSIST: Lianne Cure PA  ANESTHESIA: local with sedation   EBL: minimal  INDICATIONS: Claudia Rich is a 78 y.o. female who had a left upper arm graft placed in April. I believe in July she had angioplasty and placement of a covered stent at the venous anastomosis. She fell at home and broke her ribs and was admitted. She was found to have a clotted left upper arm AV graft. She presents for thrombectomy of her graft.  FINDINGS: the stent in the axillary vein extended very high and I had to go very high in the axilla in order to get beyond the stents. No further revisions will be possible given how high the dissection was.  TECHNIQUE: The patient was taken to the operating room and sedated by anesthesia. The left upper extremity was prepped and draped in the usual sterile fashion. After the skin was anesthetized with 1% lidocaine, an incision was made in the axilla and the venous limb of the graft was dissected free. Are identified which extended very high up and the axillary vein. I dissected very high in the axilla to get beyond the stents were the vein appeared to be a reasonable in size. The graft was then divided and graft thrombectomy achieved using a #4 Fogarty catheter. Despite passing the catheter multiple times I was unable to retrieve the arterial plug. I therefore elected to explore the arterial end of the graft. After the skin was anesthetized, an incision was made over the proximal graft. He arterial limb of the graft was dissected free. Because of dense scar tissue I elected to use a tourniquet. Tourniquet was placed on the arm and the patient had been heparinized. The tourniquet was inflated to 250 mm of mercury.  Under tourniquet control, the arterial limb of the graft was divided and the arterial plug was correctly retrieve. The arterial anastomosis was widely patent. The graft was then sewn back into in with continuous 6-0 Prolene suture.  Next at the venous end a 7 mm PTFE graft was brought to the field and sewn end-to-end to the axillary vein which had been divided after it was clamped proximally. This new segment of graft was then sewn end-to-end to the old graft after it was cut to the appropriate length. Stents were removed. At the completion there was a good thrill in the graft in a radial and ulnar signal with the Doppler at the wrist. I did not reverse the heparin. The wounds were closed with deep layer of 3-0 Vicryl and the skin closed with 4-0 Vicryl. Dermabond was applied. Patient tolerated the procedure well and was transferred to the recovery room in stable condition. All needle and sponge counts were correct.  [name], MD, FACS [facility]  DATE OF DICTATION:   06/28/2012

## 2012-06-28 NOTE — Anesthesia Preprocedure Evaluation (Signed)
Anesthesia Evaluation  Patient identified by MRN, date of birth, ID band Patient awake    Reviewed: Allergy & Precautions, H&P , NPO status , Patient's Chart, lab work & pertinent test results, reviewed documented beta blocker date and time   Airway Mallampati: II TM Distance: >3 FB Neck ROM: full    Dental   Pulmonary COPD COPD inhaler,  breath sounds clear to auscultation        Cardiovascular hypertension, + CAD and + Past MI Rhythm:regular     Neuro/Psych CVA negative psych ROS   GI/Hepatic Neg liver ROS, GERD-  Medicated and Controlled,  Endo/Other  diabetes, Oral Hypoglycemic Agents  Renal/GU ESRF and DialysisRenal disease  negative genitourinary   Musculoskeletal   Abdominal   Peds  Hematology negative hematology ROS (+)   Anesthesia Other Findings See surgeon's H&P   Reproductive/Obstetrics negative OB ROS                           Anesthesia Physical Anesthesia Plan  ASA: III  Anesthesia Plan: MAC   Post-op Pain Management:    Induction: Intravenous  Airway Management Planned: Simple Face Mask  Additional Equipment:   Intra-op Plan:   Post-operative Plan:   Informed Consent: I have reviewed the patients History and Physical, chart, labs and discussed the procedure including the risks, benefits and alternatives for the proposed anesthesia with the patient or authorized representative who has indicated his/her understanding and acceptance.   Dental Advisory Given  Plan Discussed with: CRNA and Surgeon  Anesthesia Plan Comments:         Anesthesia Quick Evaluation

## 2012-06-28 NOTE — Consult Note (Signed)
[facility] [place] CONSULT NOTE 06/28/2012 DOB: [id number] MRN : [id number]  CC: Clotted AVGG Referring Physician: Renal SX  History of Present Illness: Claudia Rich is a 78 y.o. female who has ESRD and had a left upper arm AVGG-4-7 mm PTFE end to end to axillary vein on 12/07/11 by Dr. Darrick Penna. Pt last HD treatment on Saturday. Pt fell on 06/25/12 and fractured ribs on her left side. It was found that her AVGG was now clotted with no thrill or Bruit. We were asked to see for declot and revision if necessary. Pt dialyses T, TH, sat   Past Medical History  Diagnosis Date  . Hypertension   . Diabetes mellitus   . Hyperlipidemia   . GERD (gastroesophageal reflux disease)   . Glaucoma   . Hammer toe   . Internal hemorrhoids   . Diverticulosis   . Colon polyps   . Peripheral vascular disease   . Anemia   . COPD (chronic obstructive pulmonary disease)   . Stroke 10/2010  . Myocardial infarction 10/2010  . Chronic kidney disease     Tues, Thurs, Sat @ [address]     Past Surgical History  Procedure Date  . Av fistula repair 02/16/2011    Revision of left brachiocephalic arteriovenous fistula with patch angioplasty of left brachiocephalic fistula  . Av fistula placement, brachiocephalic 09/24/2010    Left  . Av fistula placement   . Cataract extraction   . Abdominal hysterectomy   . Av fistula placement 08/14/2011    Procedure: ARTERIOVENOUS (AV) FISTULA CREATION;  Surgeon: Chuck Hint, MD;  Location: [facility] OR;  Service: Vascular;  Laterality: Right;  Right Arm Arteriovenous Fistula Creation/ contact pt's niece,[name] for any information to be relayed to pt: # [phone]  . Insertion of dialysis catheter 09/28/2011    Procedure: INSERTION OF DIALYSIS CATHETER;  Surgeon: Sherren Kerns, MD;  Location: [facility] OR;  Service: Vascular;  Laterality: Left;  Insertion of Dialysis Catheter left IJ  . Eye surgery   . Av fistula placement 12/07/2011    Procedure:  INSERTION OF ARTERIOVENOUS (AV) GORE-TEX GRAFT ARM;  Surgeon: Sherren Kerns, MD;  Location: [facility] OR;  Service: Vascular;  Laterality: Left;     ROS: [x]  Positive  [ ]  Denies    General: [ ]  Weight loss, [ ]  Fever, [ ]  chills Neurologic: [ x] Dizziness, [ ]  Blackouts, [ ]  Seizure [ ]  Stroke, [ ]  "Mini stroke", [ ]  Slurred speech, [ ]  Temporary blindness; [ ]  weakness in arms or legs, [ ]  Hoarseness Cardiac: [ ]  Chest pain/pressure, [ ]  Shortness of breath at rest [ ]  Shortness of breath with exertion, [ ]  Atrial fibrillation or irregular heartbeat Vascular: [ ]  Pain in legs with walking, [ ]  Pain in legs at rest, [ ]  Pain in legs at night,  [ ]  Non-healing ulcer, [ ]  Blood clot in vein/DVT,   Pulmonary: [ ]  Home oxygen, [ ]  Productive cough, [ ]  Coughing up blood, [ ]  Asthma,  [ ]  Wheezing Musculoskeletal:  [ ]  Arthritis, [ ]  Low back pain, [ ]  Joint pain Hematologic: [ ]  Easy Bruising, [ ]  Anemia; [ ]  Hepatitis Gastrointestinal: [ ]  Blood in stool, [ ]  Gastroesophageal Reflux/heartburn, [ ]  Trouble swallowing Urinary: [ x] chronic Kidney disease, [x ] on HD - [x ] MWF or [ ]  TTHS, [ ]  Burning with urination, [ ]  Difficulty urinating Skin: [ ]  Rashes, [ ]  Wounds Psychological: [ ]   Anxiety, [ ]  Depression  Social History History  Substance Use Topics  . Smoking status: Never Smoker   . Smokeless tobacco: Never Used  . Alcohol Use: No    Family History Family History  Problem Relation Age of Onset  . Anesthesia problems Neg Hx     Allergies  Allergen Reactions  . Aspirin     Upset stomach  . Codeine Sulfate Other (See Comments)    unknown  . Meperidine Hcl     REACTION: vomiting    Current Facility-Administered Medications  Medication Dose Route Frequency Provider Last Rate Last Dose  . 0.9 %  sodium chloride infusion  250 mL Intravenous PRN Tarry Kos, MD      . amLODipine (NORVASC) tablet 10 mg  10 mg Oral Daily Catarina Hartshorn, MD   10 mg at 06/27/12 1205  . atorvastatin  (LIPITOR) tablet 80 mg  80 mg Oral q1800 Tarry Kos, MD   80 mg at 06/27/12 1720  . calcium acetate (PHOSLO) capsule 667 mg  667 mg Oral BID WC Catarina Hartshorn, MD   667 mg at 06/27/12 1720  . HYDROcodone-acetaminophen (NORCO/VICODIN) 5-325 MG per tablet 1-2 tablet  1-2 tablet Oral Q4H PRN Tarry Kos, MD   2 tablet at 06/27/12 2002  . insulin aspart (novoLOG) injection 0-15 Units  0-15 Units Subcutaneous TID WC Catarina Hartshorn, MD   5 Units at 06/27/12 1721  . insulin aspart (novoLOG) injection 0-5 Units  0-5 Units Subcutaneous QHS Catarina Hartshorn, MD   2 Units at 06/26/12 2128  . metoprolol tartrate (LOPRESSOR) tablet 25 mg  25 mg Oral BID Catarina Hartshorn, MD   25 mg at 06/27/12 2123  . morphine 2 MG/ML injection 2 mg  2 mg Intravenous Q4H PRN Tarry Kos, MD   2 mg at 06/28/12 0853  . moxifloxacin (AVELOX) tablet 400 mg  400 mg Oral Q2000 Catarina Hartshorn, MD   400 mg at 06/27/12 2122  . sodium chloride 0.9 % injection 3 mL  3 mL Intravenous Q12H Tarry Kos, MD   3 mL at 06/27/12 2123  . sodium chloride 0.9 % injection 3 mL  3 mL Intravenous Q12H Tarry Kos, MD   3 mL at 06/26/12 1719  . sodium chloride 0.9 % injection 3 mL  3 mL Intravenous PRN Tarry Kos, MD      . [DISCONTINUED] calcium acetate (PHOSLO) capsule 667 mg  667 mg Oral BID Tarry Kos, MD   667 mg at 06/27/12 0923  . [DISCONTINUED] heparin injection 1,400 Units  20 Units/kg Dialysis PRN Gerome Apley, PA         Imaging: Dg Chest 2 View  06/28/2012  *RADIOLOGY REPORT*  Clinical Data: Evaluate for pneumothorax  CHEST - 2 VIEW  Comparison: 06/27/2012  Findings: Heart size is mildly enlarged.  Small pleural effusions appear increased from previous exam.  Persistent atelectasis is noted within both lung bases.  No pneumothorax.  Minimally displaced left rib fractures appears stable.  IMPRESSION:  1.  Increase in effusions. 2.  No pneumothorax noted.,   Original Report Authenticated By: Signa Kell, M.D.    Dg Chest 2 View  06/27/2012  *RADIOLOGY  REPORT*  Clinical Data: Check rib fractures, pneumothorax  CHEST - 2 VIEW  Comparison: 07/23/2012  Findings: The cardiac shadow is stable.  Multiple left-sided rib fractures are again seen.  A left arm stent is noted.  No significant pneumothorax is seen. Mild persistent left basilar changes are seen.  A small left-sided pleural  effusion is noted new from prior exam.  IMPRESSION: No significant pneumothorax is identified.  A new small left effusion is noted. Stable bibasilar changes.  The rib fractures appear stable.   Original Report Authenticated By: Alcide Clever, M.D.    Dg Chest 2 View  06/26/2012  *RADIOLOGY REPORT*  Clinical Data: Left-sided rib fractures.  Pneumothorax.  CHEST - 2 VIEW  Comparison: Chest x-ray 06/25/2012.  Findings: There is a tiny persistent left apical pneumothorax (less than 5% of the volume of the left hemithorax).  Extensive bilateral lower lobe interstitial and airspace disease, concerning for sequelae of aspiration.  Trace bilateral pleural effusions. Pulmonary venous congestion without frank pulmonary edema. Cardiomegaly is unchanged. The patient is rotated to the right on today's exam, resulting in distortion of the mediastinal contours and reduced diagnostic sensitivity and specificity for mediastinal pathology.  Atherosclerosis of the thoracic aorta.  Multiple left- sided rib fractures again noted.  Vascular stent in the left upper extremity soft tissues.  IMPRESSION: 1.  Multiple left-sided rib fractures with a tiny (decreasing) left apical pneumothorax. 2.  Bibasilar interstitial and airspace disease concerning for sequela of aspiration. 3.  Cardiomegaly is unchanged. 4.  Atherosclerosis.   Original Report Authenticated By: Trudie Reed, M.D.     Significant Diagnostic Studies: CBC Lab Results  Component Value Date   WBC 8.0 06/28/2012   HGB 12.0 06/28/2012   HCT 36.0 06/28/2012   MCV 97.3 06/28/2012   PLT 126* 06/28/2012    BMET    Component Value Date/Time   NA  130* 06/28/2012 0830   K 4.1 06/28/2012 0830   CL 91* 06/28/2012 0830   CO2 24 06/28/2012 0830   GLUCOSE 141* 06/28/2012 0830   BUN 36* 06/28/2012 0830   CREATININE 8.37* 06/28/2012 0830   CREATININE 7.40* 05/23/2012 0949   CALCIUM 9.1 06/28/2012 0830   CALCIUM 8.1* 11/10/2010 1329   GFRNONAA 4* 06/28/2012 0830   GFRAA 4* 06/28/2012 0830    COAG Lab Results  Component Value Date   INR 1.48 06/25/2012   INR 0.98 12/07/2011   INR 1.20 11/04/2010   No results found for this basename: PTT     Physical Examination BP Readings from Last 3 Encounters:  06/28/12 200/86  06/28/12 200/86  05/30/12 148/62   Temp Readings from Last 3 Encounters:  06/28/12 98 F (36.7 C) Oral  06/28/12 98 F (36.7 C) Oral  05/06/12 99 F (37.2 C) Oral   SpO2 Readings from Last 3 Encounters:  06/28/12 94%  06/28/12 94%  03/16/12 100%   Pulse Readings from Last 3 Encounters:  06/28/12 65  06/28/12 65  05/23/12 83    General:  WDWN in NAD Gait: Normal HENT: WNL Eyes: Pupils equal Pulmonary: normal non-labored breathing , without Rales, rhonchi,  wheezing Cardiac: RRR, without  Murmurs, rubs or gallops; No carotid bruits Abdomen: soft, NT, no masses Skin: no rashes, ulcers noted Vascular Exam/Pulses: 2+ radial pulse on left Left Hand is warm No thrill or Bruit in left upper arm AVGG  Extremities without ischemic changes, no Gangrene , no cellulitis; no open wounds;  Musculoskeletal: no muscle wasting or atrophy  Neurologic: A&O X 3; Appropriate Affect ;  SENSATION: normal; MOTOR FUNCTION: Pt has good and equal strength in all extremities - 5/5 Speech is fluent/normal Some difficulty with mobility of left arm due to fx ribs   ASSESSMENT/PLAN: thrombosed LUE AVGG in this pt admitted after fall who has fractured ribs on left Plan thrombectomy and revision today

## 2012-06-28 NOTE — Preoperative (Signed)
Beta Blockers   Reason not to administer Beta Blockers:Not Applicable, received 11/4 at 2123

## 2012-06-28 NOTE — Progress Notes (Signed)
Occupational Therapy Evaluation Patient Details Name: Claudia Rich MRN: [id number] DOB: 06/20/1927 Today's Date: 06/28/2012 Time: 4098-1191 OT Time Calculation (min): 25 min  OT Assessment / Plan / Recommendation Clinical Impression  This 78 year old female was admitted for pneumothorax.  She also has rib fxs on L.  At baseline, she lives alone and has PCA for housework 5x week.  She has been independent with BADLs.  Pt now requires mod A for mobility and max A for LB ADLs.  She will benefit from skilled OT to increase independence with adls with min A level goals in acute.    OT Assessment  Patient needs continued OT Services    Follow Up Recommendations  Skilled nursing facility;Supervision/Assistance - 24 hour    Barriers to Discharge      Equipment Recommendations   (defer tub dme to hhot when home)    Recommendations for Other Services    Frequency  Min 2X/week    Precautions / Restrictions Precautions Precautions: Fall Precaution Comments: Also very painful L rib fxs Restrictions Weight Bearing Restrictions: No   Pertinent Vitals/Pain Pain in ribs, especially left side; repositioned.  Pt was premedicated.  After therapy bp 200/81.  Sats 93-94%     ADL  Grooming: Performed;Wash/dry hands;Set up Where Assessed - Grooming: Supported sitting Upper Body Bathing: Simulated;Minimal assistance Where Assessed - Upper Body Bathing: Supported sitting Lower Body Bathing: Simulated;Maximal assistance Where Assessed - Lower Body Bathing: Supported sit to stand Upper Body Dressing: Simulated;Moderate assistance Where Assessed - Upper Body Dressing: Supported sitting Lower Body Dressing: Simulated;Maximal assistance Where Assessed - Lower Body Dressing: Supported sit to Pharmacist, hospital: Performed;Moderate assistance Toilet Transfer Method: Sit to stand Toilet Transfer Equipment: Bedside commode (ambulated about 10 feet) Toileting - Clothing Manipulation and Hygiene:  Performed;Moderate assistance (performed front only) Where Assessed - Toileting Clothing Manipulation and Hygiene: Lean right and/or left Transfers/Ambulation Related to ADLs: cotx with PT.  Pt ambulated 10 feet to commode.  Tends to posteriorly lean especially sit to stand and stand to sit:  painful ribs ADL Comments: Educated on use of reacher:  pt has been able to bend forward for adls in the past.      OT Diagnosis: Generalized weakness  OT Problem List: Decreased strength;Decreased activity tolerance;Impaired balance (sitting and/or standing);Decreased safety awareness;Decreased knowledge of use of DME or AE;Decreased knowledge of precautions;Cardiopulmonary status limiting activity;Impaired UE functional use;Pain OT Treatment Interventions: Self-care/ADL training;Therapeutic exercise;Energy conservation;DME and/or AE instruction;Therapeutic activities;Patient/family education;Balance training   OT Goals Acute Rehab OT Goals OT Goal Formulation: With patient Time For Goal Achievement: 07/12/12 Potential to Achieve Goals: Good ADL Goals Pt Will Perform Grooming: with min assist;Standing at sink (min guard) ADL Goal: Grooming - Progress: Goal set today Pt Will Perform Upper Body Bathing: with set-up;with supervision;Sitting, chair;Supported;with adaptive equipment ADL Goal: Upper Body Bathing - Progress: Goal set today Pt Will Perform Lower Body Bathing: with min assist;Sit to stand from chair;with adaptive equipment ADL Goal: Lower Body Bathing - Progress: Goal set today Pt Will Perform Upper Body Dressing: with min assist;Sitting, chair;Supported ADL Goal: Location manager Dressing - Progress: Goal set today Pt Will Perform Lower Body Dressing: with min assist;Sit to stand from chair;with adaptive equipment ADL Goal: Lower Body Dressing - Progress: Goal set today Pt Will Transfer to Toilet: with min assist;Ambulation;3-in-1;with cueing (comment type and amount) (min cues) ADL Goal: Toilet  Transfer - Progress: Goal set today Pt Will Perform Toileting - Clothing Manipulation: Sitting on 3-in-1 or toilet;with min  assist;Standing (min guard) ADL Goal: Toileting - Clothing Manipulation - Progress: Goal set today Pt Will Perform Toileting - Hygiene: with min assist;Sit to stand from 3-in-1/toilet (min guard) ADL Goal: Toileting - Hygiene - Progress: Goal set today Miscellaneous OT Goals Miscellaneous OT Goal #1: Pt will follow lue help (arom for shoulder) and incorporate lue into activities without cues within painfree range OT Goal: Miscellaneous Goal #1 - Progress: Goal set today Miscellaneous OT Goal #2: Pt will use pillow to brace ribs during coughing without cues OT Goal: Miscellaneous Goal #2 - Progress: Goal set today  Visit Information  Last OT Received On: 06/28/12 Assistance Needed: +1    Subjective Data  Subjective: No, I don't want to brush my teeth now Patient Stated Goal: go to sister's (va) to recover   Prior Functioning     Home Living Lives With: Alone Type of Home: House Home Access: Stairs to enter [employer] of Steps: 1 Home Layout: One level Bathroom Shower/Tub: Tub/shower unit (stood) Firefighter: Standard Home Adaptive Equipment: Walker - rolling;Bedside commode/3-in-1 Additional Comments: sister has same set up with no ste Prior Function Level of Independence: Independent with assistive device(s) Comments: pca does housework Communication Communication: No difficulties Dominant Hand: Right         Vision/Perception     Cognition  Overall Cognitive Status: Appears within functional limits for tasks assessed/performed Arousal/Alertness: Awake/alert Orientation Level: Appears intact for tasks assessed Behavior During Session: [facility] for tasks performed    Extremity/Trunk Assessment Right Upper Extremity Assessment RUE ROM/Strength/Tone: Within functional levels Left Upper Extremity Assessment LUE ROM/Strength/Tone:  Deficits;Due to pain LUE ROM/Strength/Tone Deficits: aarom to 90 degrees; arom about 30 degrees in shoulder flexion     Mobility Bed Mobility Supine to Sit: 3: Mod assist;With rails Details for Bed Mobility Assistance: Cues for technique, and to push through less painful right side/RUE to sit up; Got up on Right side of bed in hopes of minimizing pain Transfers Sit to Stand: 3: Mod assist;From bed;From chair/3-in-1;With upper extremity assist Stand to Sit: 3: Mod assist;To chair/3-in-1 (lost balance posteriorly.  Cues for safety)     Shoulder Instructions     Exercise     Balance     End of Session OT - End of Session Activity Tolerance: Patient limited by pain;Patient limited by fatigue Patient left: in chair;with call [place]/phone within reach  GO Functional Assessment Tool Used: clinical observation/judgment Functional Limitation: Self care Self Care Current Status (W0981): At least 80 percent but less than 100 percent impaired, limited or restricted Self Care Goal Status (X9147): At least 20 percent but less than 40 percent impaired, limited or restricted   [facility] 06/28/2012, 10:25 AM Marica Otter, OTR/L [phone] 06/28/2012

## 2012-06-28 NOTE — Anesthesia Procedure Notes (Signed)
Procedure Name: MAC Date/Time: 06/28/2012 2:40 PM Performed by: Sheppard Evens Pre-anesthesia Checklist: Patient identified, Emergency Drugs available, Suction available, Patient being monitored and Timeout performed Patient Re-evaluated:Patient Re-evaluated prior to inductionOxygen Delivery Method: Simple face mask

## 2012-06-28 NOTE — Progress Notes (Signed)
[facility] PROGRESS NOTE  Claudia Rich ZOX:[id number] DOB: 11-12-1926 DOA: 06/25/2012 PCP: Lillia Abed, MD  Assessment/Plan:  Traumatic pneumothorax  -secondary to fall -PTX resolved per followup chest x-rays -supplemental oxygen prn -No distress or hypoxemia at this time  -No syncope  Pulmonary infiltrate  -Increasing basal opacities  -empiric moxifloxacin--plan 5 days--today is day #2 Closed rib fracture  -Continue opioids prn End-stage renal disease  -Left AV graft is clotted -Dr. Darrick Penna consulted for thrombectomy today -Hemodialysis afterwards if no complications Hypertension  -Restart metoprolol and amlodipine  -Patient did not receive her morning medications due to anticipated surgery -Patient has very poor insight on her medications. I have concerns regarding patient taking her medications properly  Diabetes mellitus type 2  -Hold Januvia and glipizide for now due to procedure  -NovoLog sliding scale  -Continue aspirin  Clotted AV graft  -Dr. Darrick Penna for thrombectomy Disposition -Discussed with the POA, [name] -Plan is discharge home with 24-hour supervision with family -Patient will then be transported to [facility] to stay with power of attorney in 3-5 days after discharge  Family Communication: [name] (niece) [phone] updated today  Disposition Plan: Niece wants to take patient to [facility] unstable     Family Communication:   Pt at beside Disposition Plan:   Home when medically stable    Procedures/Studies: Dg Chest 2 View  06/28/2012  *RADIOLOGY REPORT*  Clinical Data: Evaluate for pneumothorax  CHEST - 2 VIEW  Comparison: 06/27/2012  Findings: Heart size is mildly enlarged.  Small pleural effusions appear increased from previous exam.  Persistent atelectasis is noted within both lung bases.  No pneumothorax.  Minimally displaced left rib fractures appears stable.  IMPRESSION:  1.  Increase in effusions. 2.  No pneumothorax noted.,    Original Report Authenticated By: Signa Kell, M.D.    Dg Chest 2 View  06/27/2012  *RADIOLOGY REPORT*  Clinical Data: Check rib fractures, pneumothorax  CHEST - 2 VIEW  Comparison: 07/23/2012  Findings: The cardiac shadow is stable.  Multiple left-sided rib fractures are again seen.  A left arm stent is noted.  No significant pneumothorax is seen. Mild persistent left basilar changes are seen.  A small left-sided pleural effusion is noted new from prior exam.  IMPRESSION: No significant pneumothorax is identified.  A new small left effusion is noted. Stable bibasilar changes.  The rib fractures appear stable.   Original Report Authenticated By: Alcide Clever, M.D.    Dg Chest 2 View  06/26/2012  *RADIOLOGY REPORT*  Clinical Data: Left-sided rib fractures.  Pneumothorax.  CHEST - 2 VIEW  Comparison: Chest x-ray 06/25/2012.  Findings: There is a tiny persistent left apical pneumothorax (less than 5% of the volume of the left hemithorax).  Extensive bilateral lower lobe interstitial and airspace disease, concerning for sequelae of aspiration.  Trace bilateral pleural effusions. Pulmonary venous congestion without frank pulmonary edema. Cardiomegaly is unchanged. The patient is rotated to the right on today's exam, resulting in distortion of the mediastinal contours and reduced diagnostic sensitivity and specificity for mediastinal pathology.  Atherosclerosis of the thoracic aorta.  Multiple left- sided rib fractures again noted.  Vascular stent in the left upper extremity soft tissues.  IMPRESSION: 1.  Multiple left-sided rib fractures with a tiny (decreasing) left apical pneumothorax. 2.  Bibasilar interstitial and airspace disease concerning for sequela of aspiration. 3.  Cardiomegaly is unchanged. 4.  Atherosclerosis.   Original Report Authenticated By: Trudie Reed, M.D.    Ct Chest W Contrast  06/25/2012  *  RADIOLOGY REPORT*  Clinical Data:  The patient fell today and complains of left rib pain.  Left  upper quadrant pain.  CT CHEST, ABDOMEN AND PELVIS WITH CONTRAST  Technique:  Multidetector CT imaging of the chest, abdomen and pelvis was performed following the standard protocol during bolus administration of intravenous contrast.  Contrast: OMNIPAQUE IOHEXOL 300 MG/ML  SOLN  Comparison:   None.  CT CHEST  Findings:  Mild cardiac enlargement.  Coronary artery calcification.  Normal caliber thoracic aorta with calcification. No significant mediastinal lymphadenopathy.  The esophagus is decompressed.  Small esophageal hiatal hernia.  There is a small left pleural effusion with small left pneumothorax.  There is a fibrosis or atelectasis in the lung bases with bronchial wall thickening suggesting chronic bronchitis. Airways appear patent.  Normal alignment of the thoracic spine without vertebral compression deformity.  The sternum appears intact.  Comminuted fractures of the left posterior and lateral seventh and eighth ribs with fractures of multiple locations and depression of fractures of the seventh rib posteriorly.  IMPRESSION: Comminuted and displaced fractures of the left seventh and eighth ribs with small left pleural effusion and small left pneumothorax. Consolidation or atelectasis in the lung bases with chronic bronchitic changes.  CT ABDOMEN AND PELVIS  Findings:  Normal alignment of the lumbar vertebrae without compression deformity.  Degenerative changes in the lumbar spine with degenerative disc disease.  The sacrum, pelvis, and hips appear intact.  The liver, spleen, gallbladder, adrenal glands, and retroperitoneal lymph nodes are unremarkable.  There is a circumscribed appearing cystic lesion in the tail of the pancreas measuring about 1.6 cm diameter.  This could represent a simple cyst or cystic neoplasm. 35-month follow-up study is recommended.  Small accessory spleen. The kidneys are atrophic without evidence of solid mass or hydronephrosis.  Calcification of the aorta without aneurysm.   The stomach, small bowel, and colon are not abnormally distended and there is no appreciable wall thickening.  No free air or free fluid in the abdomen.  No mesenteric or retroperitoneal fluid collections.  Pelvis:  The bladder wall is not thickened.  The uterus appears to be surgically absent.  No abnormal adnexal masses.  No free or loculated pelvic fluid collections.  No evidence of diverticulitis. The appendix is not identified.  Abdominal wall musculature appears intact.  IMPRESSION: No acute post-traumatic changes demonstrated in the abdomen or pelvis.  No evidence of solid organ injury or bowel perforation. There is incidental note of a 1.6 cm diameter cystic lesion in the tail of the pancreas.  45-month follow-up is recommended.  Results were discussed by telephone at 2157 hours on 06/25/2012 with Dr. Ambrose Mantle.   Original Report Authenticated By: Burman Nieves, M.D.    Ct Abdomen Pelvis W Contrast  06/25/2012  *RADIOLOGY REPORT*  Clinical Data:  The patient fell today and complains of left rib pain.  Left upper quadrant pain.  CT CHEST, ABDOMEN AND PELVIS WITH CONTRAST  Technique:  Multidetector CT imaging of the chest, abdomen and pelvis was performed following the standard protocol during bolus administration of intravenous contrast.  Contrast: OMNIPAQUE IOHEXOL 300 MG/ML  SOLN  Comparison:   None.  CT CHEST  Findings:  Mild cardiac enlargement.  Coronary artery calcification.  Normal caliber thoracic aorta with calcification. No significant mediastinal lymphadenopathy.  The esophagus is decompressed.  Small esophageal hiatal hernia.  There is a small left pleural effusion with small left pneumothorax.  There is a fibrosis or atelectasis in the lung bases  with bronchial wall thickening suggesting chronic bronchitis. Airways appear patent.  Normal alignment of the thoracic spine without vertebral compression deformity.  The sternum appears intact.  Comminuted fractures of the left posterior and  lateral seventh and eighth ribs with fractures of multiple locations and depression of fractures of the seventh rib posteriorly.  IMPRESSION: Comminuted and displaced fractures of the left seventh and eighth ribs with small left pleural effusion and small left pneumothorax. Consolidation or atelectasis in the lung bases with chronic bronchitic changes.  CT ABDOMEN AND PELVIS  Findings:  Normal alignment of the lumbar vertebrae without compression deformity.  Degenerative changes in the lumbar spine with degenerative disc disease.  The sacrum, pelvis, and hips appear intact.  The liver, spleen, gallbladder, adrenal glands, and retroperitoneal lymph nodes are unremarkable.  There is a circumscribed appearing cystic lesion in the tail of the pancreas measuring about 1.6 cm diameter.  This could represent a simple cyst or cystic neoplasm. 53-month follow-up study is recommended.  Small accessory spleen. The kidneys are atrophic without evidence of solid mass or hydronephrosis.  Calcification of the aorta without aneurysm.  The stomach, small bowel, and colon are not abnormally distended and there is no appreciable wall thickening.  No free air or free fluid in the abdomen.  No mesenteric or retroperitoneal fluid collections.  Pelvis:  The bladder wall is not thickened.  The uterus appears to be surgically absent.  No abnormal adnexal masses.  No free or loculated pelvic fluid collections.  No evidence of diverticulitis. The appendix is not identified.  Abdominal wall musculature appears intact.  IMPRESSION: No acute post-traumatic changes demonstrated in the abdomen or pelvis.  No evidence of solid organ injury or bowel perforation. There is incidental note of a 1.6 cm diameter cystic lesion in the tail of the pancreas.  31-month follow-up is recommended.  Results were discussed by telephone at 2157 hours on 06/25/2012 with Dr. Ambrose Mantle.   Original Report Authenticated By: Burman Nieves, M.D.    Dg Chest Portable 1  View  06/25/2012  *RADIOLOGY REPORT*  Clinical Data: Fall.  Left rib pain.  PORTABLE CHEST - 1 VIEW  Comparison: 09/28/2011  Findings: Multiple left-sided rib fractures.  This involves ribs six through eight.  There appear to be fractures both posteriorly and laterally of ribs seven and eight.  There is question of a small left apical pneumothorax.  Cardiomegaly.  Bibasilar atelectasis and small left pleural effusion.  IMPRESSION: Multiple left-sided rib fractures as above.  Small associated left pleural effusion.  Suspect tiny left apical pneumothorax.  Cardiomegaly.   Original Report Authenticated By: Charlett Nose, M.D.          Subjective: Patient denies any fevers, chills, shortness breath, nausea, vomiting, diarrhea, dizziness. She still has left sided rib pain with movement. Controlled with pain medicines  Objective: Filed Vitals:   06/28/12 0948 06/28/12 0955 06/28/12 0958 06/28/12 1039  BP: 137/74 181/81 200/86 173/71  Pulse: 64 65 65   Temp: 98 F (36.7 C)     TempSrc: Oral     Resp: 18     Height:      Weight:      SpO2: 96% 91% 94%     Intake/Output Summary (Last 24 hours) at 06/28/12 1322 Last data filed at 06/28/12 1041  Gross per 24 hour  Intake    483 ml  Output      0 ml  Net    483 ml   Weight change: 0 kg (  0 lb) Exam:   General:  Pt is alert, follows commands appropriately, not in acute distress  HEENT: No icterus, No thrush, No neck mass, [place]/AT  Cardiovascular: RRR, S1/S2, no rubs, no gallops  Respiratory: Bibasilar crackles. Good air movement. No wheezes or rhonchi.  Abdomen: Soft/+BS, non tender, non distended, no guarding  Extremities: No edema, No lymphangitis, No petechiae, No rashes, no synovitis  Data Reviewed: Basic Metabolic Panel:  Lab 06/28/12 1610 06/27/12 0530 06/26/12 0513 06/25/12 2318 06/25/12 2140  NA 130* 134* 134* 133* 143  K 4.1 3.8 3.8 3.3* <2.0*  CL 91* 94* 95* 94* 115*  CO2 24 27 28 26 19   GLUCOSE 141* 148* 181* 237*  177*  BUN 36* 26* 17 13 9   CREATININE 8.37* 6.48* 4.60* 3.77* 2.27*  CALCIUM 9.1 9.2 8.6 8.5 5.1*  MG -- -- -- -- --  PHOS 3.6 -- -- -- --   Liver Function Tests:  Lab 06/28/12 0830  AST --  ALT --  ALKPHOS --  BILITOT --  PROT --  ALBUMIN 3.0*   No results found for this basename: LIPASE:5,AMYLASE:5 in the last 168 hours No results found for this basename: AMMONIA:5 in the last 168 hours CBC:  Lab 06/28/12 0830 06/27/12 0530 06/26/12 0513 06/25/12 2318 06/25/12 2140  WBC 8.0 10.0 11.9* 13.5* 10.4  NEUTROABS -- -- -- 12.2* 9.3*  HGB 12.0 12.4 11.9* 12.2 8.5*  HCT 36.0 38.2 37.2 38.1 26.8*  MCV 97.3 97.7 99.2 100.0 101.1*  PLT 126* 149* 146* 137* 100*   Cardiac Enzymes: No results found for this basename: CKTOTAL:5,CKMB:5,CKMBINDEX:5,TROPONINI:5 in the last 168 hours BNP: No components found with this basename: POCBNP:5 CBG:  Lab 06/28/12 1209 06/28/12 0745 06/27/12 2228 06/27/12 1628 06/27/12 1132  GLUCAP 110* 139* 121* 220* 143*    Recent Results (from the past 240 hour(s))  MRSA PCR SCREENING     Status: Normal   Collection Time   06/26/12  2:30 AM      Component Value Range Status Comment   MRSA by PCR NEGATIVE  NEGATIVE Final      Scheduled Meds:   . amLODipine  10 mg Oral Daily  . atorvastatin  80 mg Oral q1800  . calcium acetate  667 mg Oral BID WC  . cefUROXime (ZINACEF)  IV  1.5 g Intravenous On Call to OR  . insulin aspart  0-15 Units Subcutaneous TID WC  . insulin aspart  0-5 Units Subcutaneous QHS  . metoprolol tartrate  25 mg Oral BID  . moxifloxacin  400 mg Oral Q2000  . sodium chloride  3 mL Intravenous Q12H  . sodium chloride  3 mL Intravenous Q12H  . [DISCONTINUED] calcium acetate  667 mg Oral BID   Continuous Infusions:    Britnie Colville, DO  [facility] Pager [phone]  If 7PM-7AM, please contact night-coverage [email or web address] Password [email or web address] 06/28/2012, 1:22 PM   LOS: 3 days

## 2012-06-28 NOTE — Consult Note (Signed)
Agree with above. Plan thrombectomy of AVG left arm today. Di Kindle. Edilia Bo, MD, FACS Beeper [phone] 06/28/2012

## 2012-06-28 NOTE — Progress Notes (Signed)
Physical Therapy Treatment Patient Details Name: Claudia Rich MRN: [id number] DOB: 01/24/1927 Today's Date: 06/28/2012 Time: 0932-1000 PT Time Calculation (min): 28 min  PT Assessment / Plan / Recommendation Comments on Treatment Session  Continuing improvements in mobility, with increased amb distance; Still requiring considerable assist for mobility; Continue to recommend SNF for rehab to maximize independence and safety with mobility/ADLs prior to possibly going to [place] with sister; Would it be possible for her sister to come here to provide assist in the home at [place]?    Follow Up Recommendations  Post acute inpatient     Does the patient have the potential to tolerate intense rehabilitation  No, Recommend SNF  Barriers to Discharge        Equipment Recommendations  Tub/shower seat    Recommendations for Other Services    Frequency Min 3X/week   Plan Discharge plan remains appropriate    Precautions / Restrictions Precautions Precautions: Fall Precaution Comments: Also very painful L rib fxs Restrictions Weight Bearing Restrictions: No Other Position/Activity Restrictions: Encourage deep breathing; BP becomes quite elevated with incr pain with activity]   Pertinent Vitals/Pain Continued grimace with any motion, and incr BP  Pre OOB BP181/81, O2 sats 91% Post OOB BP 200/86 O2 sats 94% Rn notified   Mobility  Bed Mobility Bed Mobility: Supine to Sit Supine to Sit: 3: Mod assist;With rails Details for Bed Mobility Assistance: Cues for technique, and to push through less painful right side/RUE to sit up; Got up on Right side of bed in hopes of minimizing pain Transfers Transfers: Sit to Stand;Stand to Sit Sit to Stand: 3: Mod assist;From bed;From chair/3-in-1;With upper extremity assist Stand to Sit: 3: Mod assist;To chair/3-in-1 Details for Transfer Assistance: Continues to require anti-gravity assistance for transfers Ambulation/Gait Ambulation/Gait Assistance: 4:  Min assist Ambulation Distance (Feet): 10 Feet (5x2, bed to bsc to recliner) Assistive device: 2 person hand held assist Ambulation/Gait Assistance Details: Pt opted to go without RW for amb; support provided bilaterally at handheld assist and elbow support Gait Pattern: Decreased step length - right;Decreased step length - left Gait velocity: slowed, likely by pain    Exercises Other Exercises Other Exercises: continuing practice of deep breathing with pillow splinting of ribs; Noted induced shallow cough, which was quite painful   PT Diagnosis:    PT Problem List:   PT Treatment Interventions:     PT Goals Acute Rehab PT Goals Time For Goal Achievement: 07/10/12 Potential to Achieve Goals: Good Pt will go Supine/Side to Sit: with modified independence PT Goal: Supine/Side to Sit - Progress: Progressing toward goal Pt will go Sit to Supine/Side: with modified independence Pt will go Sit to Stand: with modified independence PT Goal: Sit to Stand - Progress: Progressing toward goal Pt will go Stand to Sit: with modified independence PT Goal: Stand to Sit - Progress: Progressing toward goal Pt will Ambulate: >150 feet;with modified independence;with rolling walker PT Goal: Ambulate - Progress: Progressing toward goal Pt will Perform Home Exercise Program: Independently PT Goal: Perform Home Exercise Program - Progress: Progressing toward goal  Visit Information  Last PT Received On: 06/28/12 Assistance Needed: +1    Subjective Data  Subjective: Nervous about getting OOB bacause of pain, but agreeable Patient Stated Goal: Would like to stay with her sister   Cognition  Overall Cognitive Status: Appears within functional limits for tasks assessed/performed Arousal/Alertness: Awake/alert Orientation Level: Appears intact for tasks assessed Behavior During Session: [facility] for tasks performed Cognition - Other Comments: Anxiety  secondary to pain    Balance     End of Session PT -  End of Session Activity Tolerance: Patient tolerated treatment well;Patient limited by pain Patient left: in chair;with call Tomas/phone within reach Nurse Communication: Mobility status   GP Functional Assessment Tool Used: Clinical judgement Functional Limitation: Mobility: Walking and moving around Mobility: Walking and Moving Around Current Status ([id number]): At least 20 percent but less than 40 percent impaired, limited or restricted Mobility: Walking and Moving Around Goal Status [phone]): At least 1 percent but less than 20 percent impaired, limited or restricted   Van Clines [facility] [place], [place] [phone]  06/28/2012, 11:10 AM

## 2012-06-29 ENCOUNTER — Encounter (HOSPITAL_COMMUNITY): Payer: Self-pay | Admitting: Vascular Surgery

## 2012-06-29 ENCOUNTER — Observation Stay (HOSPITAL_COMMUNITY): Payer: Medicare HMO

## 2012-06-29 DIAGNOSIS — J9383 Other pneumothorax: Secondary | ICD-10-CM

## 2012-06-29 LAB — BASIC METABOLIC PANEL
Chloride: 99 mEq/L (ref 96–112)
Creatinine, Ser: 4.83 mg/dL — ABNORMAL HIGH (ref 0.50–1.10)
GFR calc Af Amer: 9 mL/min — ABNORMAL LOW (ref >90)

## 2012-06-29 LAB — GLUCOSE, CAPILLARY: Glucose-Capillary: 240 mg/dL — ABNORMAL HIGH (ref 70–99)

## 2012-06-29 MED ORDER — DARBEPOETIN ALFA-POLYSORBATE 60 MCG/0.3ML IJ SOLN
60.0000 ug | INTRAMUSCULAR | Status: DC
  Administered 2012-06-30: 60 ug via INTRAVENOUS
  Filled 2012-06-29: qty 0.3

## 2012-06-29 MED ORDER — ALBUMIN HUMAN 25 % IV SOLN
INTRAVENOUS | Status: AC
  Administered 2012-06-29: 25 g via INTRAVENOUS
  Filled 2012-06-29: qty 100

## 2012-06-29 MED ORDER — AMLODIPINE BESYLATE 5 MG PO TABS
5.0000 mg | ORAL_TABLET | Freq: Every day | ORAL | Status: DC
  Administered 2012-06-30: 5 mg via ORAL
  Filled 2012-06-29: qty 1

## 2012-06-29 MED ORDER — ACETAMINOPHEN 500 MG PO TABS
500.0000 mg | ORAL_TABLET | Freq: Three times a day (TID) | ORAL | Status: DC
  Administered 2012-06-29 – 2012-07-01 (×6): 500 mg via ORAL
  Filled 2012-06-29 (×8): qty 1

## 2012-06-29 MED ORDER — ALBUMIN HUMAN 25 % IV SOLN
25.0000 g | Freq: Once | INTRAVENOUS | Status: AC
  Administered 2012-06-29: 25 g via INTRAVENOUS
  Filled 2012-06-29: qty 100

## 2012-06-29 MED ORDER — SODIUM CHLORIDE 0.9 % IV SOLN
62.5000 mg | INTRAVENOUS | Status: DC
  Administered 2012-06-30: 62.5 mg via INTRAVENOUS
  Filled 2012-06-29 (×2): qty 5

## 2012-06-29 NOTE — Progress Notes (Signed)
[facility] PROGRESS NOTE  Claudia Rich ZOX:[id number] DOB: 09-30-1926 DOA: 06/25/2012 PCP: Lillia Abed, MD  Assessment/Plan: Traumatic pneumothorax  -secondary to fall  -PTX resolved per followup chest x-rays  -supplemental oxygen prn  -No distress or hypoxemia at this time  -No syncope  Will add incentive spirometry. Pain management. Pulmonary infiltrate  -Increasing basal opacities  -empiric moxifloxacin--plan 5 days--today is day #3  Closed rib fracture  -Continue opioids prn. Will start on scheduled Tylenol. End-stage renal disease  -Left AV graft is clotted  -Patient is status post thrombectomy and revision of left AV graft  -Hemodialysis afterwards if no complications. Per renal. Hypertension  -Continue metoprolol and amlodipine monitor blood pressure closely if borderline will need to decrease dose of Norvasc to 5 mg daily. Diabetes mellitus type 2  -Hold Januvia and glipizide for now due to procedure  -NovoLog sliding scale  -Continue aspirin  Clotted AV graft  -S/P thrombectomy and revision of left upper extremity AV graft per vascular surgery. Follow. Disposition  -Dr. Arbutus Leas Discussed with the POA, [name]  -Plan is discharge home with 24-hour supervision with family  -Patient will then be transported to [facility] to stay with power of attorney, per Dr. Arbutus Leas after discharge. Per nephrology notes dialysis has been arranged for patient to [place].       Code Status: Full Family Communication: Updated patient. No family at bedside. Disposition Plan: Home with family with 24-hour supervision when medically stable.   Consultants:  Vascular surgery: Dr. Edilia Bo 06/28/2012  Nephrology: Dr. Arlean Hopping 06/27/2012  Interventional radiology Dr. Fredia Sorrow 06/27/2012  Procedures:  Thrombectomy and revision of left upper arm AV graft 06/28/2012 per Dr. Edilia Bo  Antibiotics:  None  HPI/Subjective: Patient complaining of left-sided rib pain which he states  might be slightly better since admission.  Objective: Filed Vitals:   06/29/12 0329 06/29/12 0428 06/29/12 0918 06/29/12 1321  BP: 131/51 113/54 108/40 121/93  Pulse: 101 95 84 73  Temp: 98.1 F (36.7 C) 97.8 F (36.6 C) 97.9 F (36.6 C) 98.3 F (36.8 C)  TempSrc: Oral Oral    Resp: 24 20 18 17   Height:      Weight:      SpO2: 100% 100% 95% 100%    Intake/Output Summary (Last 24 hours) at 06/29/12 1421 Last data filed at 06/29/12 1321  Gross per 24 hour  Intake 1120.49 ml  Output   1391 ml  Net -270.51 ml   Filed Weights   06/27/12 2049 06/28/12 2136 06/29/12 0208  Weight: 70.8 kg (156 lb 1.4 oz) 70.7 kg (155 lb 13.8 oz) 68.7 kg (151 lb 7.3 oz)    Exam:   General:  NAD  Cardiovascular: RRR. No lower extremity edema.  Respiratory: CTAB anterior lung fields  Abdomen: Soft, nontender, nondistended, positive bowel sounds.  Data Reviewed: Basic Metabolic Panel:  Lab 06/29/12 8119 06/28/12 1949 06/28/12 0830 06/27/12 0530 06/26/12 0513  NA 138 132* 130* 134* 134*  K 3.9 3.9 4.1 3.8 3.8  CL 99 94* 91* 94* 95*  CO2 28 25 24 27 28   GLUCOSE 115* 255* 141* 148* 181*  BUN 14 42* 36* 26* 17  CREATININE 4.83* 9.48* 8.37* 6.48* 4.60*  CALCIUM 8.3* 8.4 9.1 9.2 8.6  MG -- -- -- -- --  PHOS -- 5.0* 3.6 -- --   Liver Function Tests:  Lab 06/28/12 1949 06/28/12 0830  AST -- --  ALT -- --  ALKPHOS -- --  BILITOT -- --  PROT -- --  ALBUMIN 2.8* 3.0*   No results found for this basename: LIPASE:5,AMYLASE:5 in the last 168 hours No results found for this basename: AMMONIA:5 in the last 168 hours CBC:  Lab 06/28/12 1949 06/28/12 0830 06/27/12 0530 06/26/12 0513 06/25/12 2318 06/25/12 2140  WBC 8.6 8.0 10.0 11.9* 13.5* --  NEUTROABS -- -- -- -- 12.2* 9.3*  HGB 10.8* 12.0 12.4 11.9* 12.2 --  HCT 33.5* 36.0 38.2 37.2 38.1 --  MCV 98.0 97.3 97.7 99.2 100.0 --  PLT 136* 126* 149* 146* 137* --   Cardiac Enzymes: No results found for this basename:  CKTOTAL:5,CKMB:5,CKMBINDEX:5,TROPONINI:5 in the last 168 hours BNP (last 3 results) No results found for this basename: PROBNP:3 in the last 8760 hours CBG:  Lab 06/29/12 1126 06/29/12 0728 06/29/12 0326 06/28/12 2129 06/28/12 1818  GLUCAP 153* 107* 111* 247* 140*    Recent Results (from the past 240 hour(s))  MRSA PCR SCREENING     Status: Normal   Collection Time   06/26/12  2:30 AM      Component Value Range Status Comment   MRSA by PCR NEGATIVE  NEGATIVE Final      Studies: Dg Chest 2 View  06/28/2012  *RADIOLOGY REPORT*  Clinical Data: Evaluate for pneumothorax  CHEST - 2 VIEW  Comparison: 06/27/2012  Findings: Heart size is mildly enlarged.  Small pleural effusions appear increased from previous exam.  Persistent atelectasis is noted within both lung bases.  No pneumothorax.  Minimally displaced left rib fractures appears stable.  IMPRESSION:  1.  Increase in effusions. 2.  No pneumothorax noted.,   Original Report Authenticated By: Signa Kell, M.D.    Dg Chest 2 View  06/27/2012  *RADIOLOGY REPORT*  Clinical Data: Check rib fractures, pneumothorax  CHEST - 2 VIEW  Comparison: 07/23/2012  Findings: The cardiac shadow is stable.  Multiple left-sided rib fractures are again seen.  A left arm stent is noted.  No significant pneumothorax is seen. Mild persistent left basilar changes are seen.  A small left-sided pleural effusion is noted new from prior exam.  IMPRESSION: No significant pneumothorax is identified.  A new small left effusion is noted. Stable bibasilar changes.  The rib fractures appear stable.   Original Report Authenticated By: Alcide Clever, M.D.     Scheduled Meds:   . [COMPLETED] albumin human  25 g Intravenous Once  . amLODipine  10 mg Oral Daily  . aspirin  81 mg Oral Daily  . atorvastatin  80 mg Oral q1800  . calcium acetate  667 mg Oral BID WC  . [COMPLETED] cefUROXime (ZINACEF)  IV  1.5 g Intravenous On Call to OR  . darbepoetin (ARANESP) injection -  DIALYSIS  60 mcg Intravenous Q Thu-HD  . ferric gluconate (FERRLECIT/NULECIT) IV  62.5 mg Intravenous Q Thu-HD  . insulin aspart  0-15 Units Subcutaneous TID WC  . insulin aspart  0-5 Units Subcutaneous QHS  . metoprolol tartrate  25 mg Oral BID  . moxifloxacin  400 mg Oral Q2000  . sodium chloride  3 mL Intravenous Q12H  . sodium chloride  3 mL Intravenous Q12H   Continuous Infusions:   Principal Problem:  *Pneumothorax, traumatic Active Problems:  ANEMIA, NORMOCYTIC  End stage renal disease  CAD (coronary artery disease)  Fall at home  Closed rib fracture  Hypokalemia    Time spent: > 35 mins    [facility]  [facility] Pager [phone]. If 8PM-8AM, please contact night-coverage at [email or web address], password [email or web address] 06/29/2012, 2:21 PM  LOS: 4 days

## 2012-06-29 NOTE — Progress Notes (Signed)
VASCULAR PROGRESS NOTE  SUBJECTIVE: No complaints this morning.  PHYSICAL EXAM: Filed Vitals:   06/29/12 0200 06/29/12 0208 06/29/12 0329 06/29/12 0428  BP: 125/60 122/57 131/51 113/54  Pulse: 92 105 101 95  Temp:  97.5 F (36.4 C) 98.1 F (36.7 C) 97.8 F (36.6 C)  TempSrc:  Oral Oral Oral  Resp: 17 23 24 20   Height:      Weight:  151 lb 7.3 oz (68.7 kg)    SpO2:  97% 100% 100%   Good bruit and thrill in left upper arm AV graft.  Palpable left radial pulse.  CBG (last 3)   Basename 06/29/12 0326 06/28/12 2129 06/28/12 1818  GLUCAP 111* 247* 140*   ASSESSMENT/PLAN: 1. 1 Day Post-Op s/p: Thrombectomy and revision of left upper arm AV graft. 2. The graft was used last night for dialysis. 3. VVS will be available as needed.  [name], MD, FACS Beeper: [phone] 06/29/2012

## 2012-06-29 NOTE — Progress Notes (Signed)
Subjective:   Currently comfortable, left-side chest pain with movement or deep inspirations.  Objective: Vital signs in last 24 hours: Temp:  [97.3 F (36.3 C)-98.1 F (36.7 C)] 97.8 F (36.6 C) (11/06 0428) Pulse Rate:  [35-105] 95  (11/06 0428) Resp:  [10-29] 20  (11/06 0428) BP: (66-200)/(41-86) 113/54 mmHg (11/06 0428) SpO2:  [83 %-100 %] 100 % (11/06 0428) Weight:  [68.7 kg (151 lb 7.3 oz)-70.7 kg (155 lb 13.8 oz)] 68.7 kg (151 lb 7.3 oz) (11/06 0208) Weight change: -0.1 kg (-3.5 oz)  Intake/Output from previous day: 11/05 0701 - 11/06 0700 In: 1003.5 [P.O.:560; I.V.:443.5] Out: 1391 [Blood:100]   EXAM: General appearance:  Alert, in no apparent distress Resp:  Poor effort, secondary to pain, but clear Cardio:  RRR without murmur or rub GI:  + BS, soft and nontender Extremities:  No edema Access:  AVG @ LUA with + bruit  Lab Results:  Basename 06/28/12 1949 06/28/12 0830  WBC 8.6 8.0  HGB 10.8* 12.0  HCT 33.5* 36.0  PLT 136* 126*   BMET:  Basename 06/29/12 0540 06/28/12 1949 06/28/12 0830  NA 138 132* --  K 3.9 3.9 --  CL 99 94* --  CO2 28 25 --  GLUCOSE 115* 255* --  BUN 14 42* --  CREATININE 4.83* 9.48* --  CALCIUM 8.3* 8.4 --  ALBUMIN -- 2.8* 3.0*   No results found for this basename: PTH:2 in the last 72 hours Iron Studies: No results found for this basename: IRON,TIBC,TRANSFERRIN,FERRITIN in the last 72 hours  Dialysis Orders: Center: [facility] on TTS.  EDW 68.5 kg HD Bath 2K/2.25Ca Time 3 hrs 45 mins Heparin 9000 U. Access AVG @ LUA BFR 400 DFR A1.5  Zemplar 4 mcg IV/HD Epogen 2600 Units IV/HD Venofer 100 mg on Thurs.   Assessment/Plan: 1. Multiple rib fractures/small PTX- significant pain with movement and palpation. 2. Clotted access - s/p thrombectomy and revision of AVG @ LUA yesterday per Dr. Edilia Bo of VVS, successfully used for HD last night. 3. ESRD - HD on TTS @ [facility]; K 3.9 today s/p HD last night.  4. Hypertension/volume - BP 113/54 on  Norvasc 10 mg qhs and Metoprolol 25 mg bid; wt 68.7 kg s/p net UF 1.3 L yesterday, EDW of 68.5 kg.  5. Anemia - Hgb 10.8 on outpatient Epogen 2600 U and weekly Fe. Continue IV Fe on Thurs, Aranesp tomorrow s/p surgery. 6. Metabolic bone disease - Ca 8.3, P 5, last iPTH 233 on 10/17; on Zemplar 4 mcg, Phoslo 2 with meals and 1 with snacks. 7. Nutrition - Alb 2.8. 8. DM Type 2 - on Januvia and Glucotrol. 9. Hx CVA - TIA with [facility] in 10/2010. 10. Disposition- talked with niece and POA in [place]. They will have family with pt 24/7 for the next two months. Cherly Hensen will be living with her here in [place] for 1-2 weeks, then she is going to [place] to stay w niece. Dialysis arrangements have already been made in [place] (this was planned prior to fall). She is to be staying there til January. HD tomorrow here, consider d/c on Friday and she can get outpt HD on sat.   LOS: 4 days   LYLES,CHARLES 06/29/2012,7:07 AM  Patient seen and examined and agree with assessment and plan as above with additions as indicated.  Vinson Moselle  MD [employer] [phone] pgr    [phone] cell 06/29/2012, 3:28 PM

## 2012-06-29 NOTE — Progress Notes (Signed)
[facility]  Patient Status: New  [facility] is providing the following services: RN and PT  If patient discharges after hours, please call [phone].   Wynelle Bourgeois 06/29/2012, 7:33 PM

## 2012-06-29 NOTE — Anesthesia Postprocedure Evaluation (Signed)
  Anesthesia Post-op Note  Patient: Claudia Rich  Procedure(s) Performed: Procedure(s) (LRB) with comments: THROMBECTOMY AND REVISION OF ARTERIOVENTOUS (AV) GORETEX  GRAFT (Left)  Patient stable on discharge from PACU

## 2012-06-29 NOTE — Progress Notes (Signed)
   CARE MANAGEMENT NOTE 06/29/2012  Patient:  Claudia Rich, Claudia Rich   Account Number:  [id number]  Date Initiated:  06/29/2012  Documentation initiated by:  Johny Shock  Subjective/Objective Assessment:   Plan for d/c to home with 24 hr care and later move to [facility] with neice who is also HCPOA.     Action/Plan:   Spoke with niece who is HCPOA, will ask [facility] for [facility] services. Pt has walker and 3:1 commode, no DME needs.   Anticipated DC Date:  06/29/2012   Anticipated DC Plan:  HOME W HOME HEALTH SERVICES         Choice offered to / List presented to:          [facility] arranged  HH-1 RN  HH-2 PT  HH-3 OT      [facility] agency  [facility]   Status of service:  Completed, signed off Medicare Important Message given?   (If response is "NO", the following Medicare IM given date fields will be blank) Date Medicare IM given:   Date Additional Medicare IM given:    Discharge Disposition:  HOME W HOME HEALTH SERVICES  Per UR Regulation:    If discussed at Long Length of Stay Meetings, dates discussed:    Comments:  06/29/2012 Spoke with pt niece who would also like HH arranged in [place], however at this time she is unsure of when the trip to [place] will take place and pt needs HH now in [place]. [facility] notified of this concern and will assist pt and family with arranging HH in [place] if pt qualifies and still has needs when she is ready to travel to [place]. [facility] will provide services in [place] per pt choice as she has used that agency previously. Johny Shock RN MPH Case Management [phone]

## 2012-06-30 ENCOUNTER — Observation Stay (HOSPITAL_COMMUNITY): Payer: Medicare HMO

## 2012-06-30 DIAGNOSIS — D649 Anemia, unspecified: Secondary | ICD-10-CM

## 2012-06-30 LAB — GLUCOSE, CAPILLARY
Glucose-Capillary: 164 mg/dL — ABNORMAL HIGH (ref 70–99)
Glucose-Capillary: 218 mg/dL — ABNORMAL HIGH (ref 70–99)
Glucose-Capillary: 170 mg/dL — ABNORMAL HIGH (ref 70–99)

## 2012-06-30 LAB — RENAL FUNCTION PANEL
BUN: 30 mg/dL — ABNORMAL HIGH (ref 6–23)
Calcium: 8.6 mg/dL (ref 8.4–10.5)
Glucose, Bld: 188 mg/dL — ABNORMAL HIGH (ref 70–99)
Phosphorus: 3.1 mg/dL (ref 2.3–4.6)
Sodium: 136 mEq/L (ref 135–145)

## 2012-06-30 LAB — CBC
HCT: 29.1 % — ABNORMAL LOW (ref 36.0–46.0)
Hemoglobin: 9.4 g/dL — ABNORMAL LOW (ref 12.0–15.0)
MCH: 31.6 pg (ref 26.0–34.0)
MCHC: 32.3 g/dL (ref 30.0–36.0)

## 2012-06-30 MED ORDER — MORPHINE SULFATE 2 MG/ML IJ SOLN
INTRAMUSCULAR | Status: AC
  Administered 2012-06-30: 2 mg via INTRAVENOUS
  Filled 2012-06-30: qty 1

## 2012-06-30 MED ORDER — DARBEPOETIN ALFA-POLYSORBATE 60 MCG/0.3ML IJ SOLN
INTRAMUSCULAR | Status: AC
  Administered 2012-06-30: 60 ug via INTRAVENOUS
  Filled 2012-06-30: qty 0.3

## 2012-06-30 MED ORDER — METOPROLOL TARTRATE 12.5 MG HALF TABLET
12.5000 mg | ORAL_TABLET | Freq: Two times a day (BID) | ORAL | Status: DC
Start: 2012-06-30 — End: 2012-07-01
  Administered 2012-06-30 – 2012-07-01 (×2): 12.5 mg via ORAL
  Filled 2012-06-30 (×3): qty 1

## 2012-06-30 MED ORDER — PENTAFLUOROPROP-TETRAFLUOROETH EX AERO
INHALATION_SPRAY | CUTANEOUS | Status: AC
  Administered 2012-06-30: 1
  Filled 2012-06-30: qty 103.5

## 2012-06-30 MED ORDER — AMLODIPINE BESYLATE 2.5 MG PO TABS
2.5000 mg | ORAL_TABLET | Freq: Every day | ORAL | Status: DC
  Administered 2012-07-01: 2.5 mg via ORAL
  Filled 2012-06-30: qty 1

## 2012-06-30 NOTE — Progress Notes (Signed)
Pt off unit

## 2012-06-30 NOTE — Progress Notes (Signed)
Subjective:  Seen on HD, currently comfortable, left-side chest pain with movement or deep inspirations.  Objective: Vital signs in last 24 hours: Temp:  [97.9 F (36.6 C)-98.4 F (36.9 C)] 98.1 F (36.7 C) (11/07 0650) Pulse Rate:  [70-86] 72  (11/07 0730) Resp:  [12-19] 12  (11/07 0730) BP: (98-131)/(40-93) 116/49 mmHg (11/07 0730) SpO2:  [90 %-100 %] 90 % (11/07 0650) Weight:  [69.5 kg (153 lb 3.5 oz)-72.8 kg (160 lb 7.9 oz)] 69.5 kg (153 lb 3.5 oz) (11/07 0655) Weight change: 2.1 kg (4 lb 10.1 oz)  Intake/Output from previous day: 11/06 0701 - 11/07 0700 In: 660 [P.O.:660] Out: -    EXAM: General appearance:  Alert, in no apparent distress Resp:  Poor effort, secondary to pain, but clear Cardio:  RRR without murmur or rub GI: + BS, soft and nontender Extremities:  No edema Access:  AVG @ LUA with BFR 400 cc/min  Lab Results:  [facility] 06/30/12 0705 06/28/12 1949  WBC 8.3 8.6  HGB 9.4* 10.8*  HCT 29.1* 33.5*  PLT 127* 136*   BMET:  Basename 06/29/12 0540 06/28/12 1949 06/28/12 0830  NA 138 132* --  K 3.9 3.9 --  CL 99 94* --  CO2 28 25 --  GLUCOSE 115* 255* --  BUN 14 42* --  CREATININE 4.83* 9.48* --  CALCIUM 8.3* 8.4 --  ALBUMIN -- 2.8* 3.0*   No results found for this basename: PTH:2 in the last 72 hours Iron Studies: No results found for this basename: IRON,TIBC,TRANSFERRIN,FERRITIN in the last 72 hours  Dialysis Orders: Center: [facility] on TTS.  EDW 68.5 kg HD Bath 2K/2.25Ca Time 3 hrs 45 mins Heparin 9000 U. Access AVG @ LUA BFR 400 DFR A1.5  Zemplar 4 mcg IV/HD Epogen 2600 Units IV/HD Venofer 100 mg on Thurs.   Assessment/Plan: 1. Multiple rib fractures/small PTX- significant pain with movement and palpation. 2. Clotted access - s/p thrombectomy and revision of AVG @ LUA on 11/5 per Dr. Edilia Bo of [facility], successfully using for HD. 3. ESRD - HD on TTS @ [facility]; K 3.9 today.  4. Hypertension/volume - BP 116/49 on Norvasc 10 mg qhs and Metoprolol 25 mg bid;  wt 69.5 kg, EDW of 68.5 kg. UF goal of 1 L today. 5. Anemia - Hgb down to 9.4 today, on outpatient Epogen 2600 U and weekly Fe. Continue IV Fe on Thurs, Aranesp tomorrow s/p surgery. 6. Metabolic bone disease - Ca 8.3, P 5, last iPTH 233 on 10/17; on Zemplar 4 mcg, Phoslo 2 with meals and 1 with snacks. 7. Nutrition - Alb 2.8. 8. DM Type 2 - on Januvia and Glucotrol. 9. Hx CVA - TIA with AMS in 10/2010. 10. Disposition- we talked with niece and POA in [place]. They will have family with pt 24/7 for the next two months. First, upon discharge, cousins will be living with her here in [place] for 1-2 weeks, then she is going to [place] to stay w niece until January or so. Dialysis arrangements have already been made in [place] (this was planned prior to fall, etc.). HD today.    LOS: 5 days   LYLES,CHARLES 06/30/2012,7:55 AM  Patient seen and examined and agree with assessment and plan as above.  Vinson Moselle  MD [facility] [phone] pgr    [phone] cell 06/30/2012, 11:33 AM

## 2012-06-30 NOTE — Progress Notes (Signed)
Occupational Therapy Treatment Patient Details Name: Claudia Rich MRN: [id number] DOB: 25-Jan-1927 Today's Date: 06/30/2012 Time: 4098-1191 OT Time Calculation (min): 30 min  OT Assessment / Plan / Recommendation Comments on Treatment Session Pt limited by pain. Pt will need SNF for rehab. Will continue to follow to max independence with ADL and functional mobility. Requires encouragement for mobility .    Follow Up Recommendations  SNF    Barriers to Discharge       Equipment Recommendations  Tub/shower seat    Recommendations for Other Services    Frequency Min 2X/week   Plan Discharge plan remains appropriate    Precautions / Restrictions Precautions Precautions: Fall Precaution Comments: Also very painful L rib fxs   Pertinent Vitals/Pain Pain in ribs. Did not rate    ADL  ADL Comments: Focus of session on bed mobility and fucntional transfers in preparation for ADL.    OT Diagnosis:    OT Problem List:   OT Treatment Interventions:     OT Goals Acute Rehab OT Goals OT Goal Formulation: With patient Time For Goal Achievement: 07/12/12 Potential to Achieve Goals: Good ADL Goals Pt Will Perform Grooming: with min assist;Standing at sink ADL Goal: Grooming - Progress: Progressing toward goals Pt Will Perform Upper Body Bathing: with set-up;with supervision;Sitting, chair;Supported;with adaptive equipment ADL Goal: Upper Body Bathing - Progress: Progressing toward goals Pt Will Perform Lower Body Bathing: with min assist;Sit to stand from chair;with adaptive equipment ADL Goal: Lower Body Bathing - Progress: Progressing toward goals Pt Will Perform Upper Body Dressing: with min assist;Sitting, chair;Supported ADL Goal: Location manager Dressing - Progress: Progressing toward goals Pt Will Perform Lower Body Dressing: with min assist;Sit to stand from chair;with adaptive equipment ADL Goal: Lower Body Dressing - Progress: Progressing toward goals Pt Will Transfer to Toilet:  with min assist;Ambulation;3-in-1;with cueing (comment type and amount) ADL Goal: Toilet Transfer - Progress: Progressing toward goals Pt Will Perform Toileting - Clothing Manipulation: Sitting on 3-in-1 or toilet;with min assist;Standing ADL Goal: Toileting - Clothing Manipulation - Progress: Progressing toward goals Pt Will Perform Toileting - Hygiene: with min assist;Sit to stand from 3-in-1/toilet ADL Goal: Toileting - Hygiene - Progress: Progressing toward goals Miscellaneous OT Goals Miscellaneous OT Goal #1: Pt will follow lue help (arom for shoulder) and incorporate lue into activities without cues within painfree range OT Goal: Miscellaneous Goal #1 - Progress: Progressing toward goals Miscellaneous OT Goal #2: Pt will use pillow to brace ribs during coughing without cues OT Goal: Miscellaneous Goal #2 - Progress: Progressing toward goals  Visit Information  Last OT Received On: 06/30/12 Assistance Needed: +1    Subjective Data      Prior Functioning       Cognition  Overall Cognitive Status: No family/caregiver present to determine baseline cognitive functioning Area of Impairment: Problem solving Arousal/Alertness: Awake/alert Behavior During Session: WFL for tasks performed Problem Solving: mod A  functinal basic Cognition - Other Comments: Anxiety secondary to pain    Mobility  Shoulder Instructions Bed Mobility Bed Mobility: Rolling Right;Right Sidelying to Sit;Sitting - Scoot to [place] of Bed Rolling Right: 2: Max assist Right Sidelying to Sit: 2: Max assist Supine to Sit: 2: Max assist;With rails Sitting - Scoot to [place] of Bed: 4: Min assist Sit to Supine: 2: Max assist;HOB elevated;With rail Details for Bed Mobility Assistance: Pt initially declined to m Transfers Transfers: Sit to Stand;Stand to Sit Sit to Stand: 3: Mod assist;With upper extremity assist;From bed Stand to Sit: 3: Mod  assist;With upper extremity assist Details for Transfer Assistance:  limited by pain       Exercises      Balance     End of Session OT - End of Session Activity Tolerance: Patient limited by pain Patient left: in bed;with call Decarlo/phone within reach Nurse Communication: Mobility status  GO Functional Assessment Tool Used: clinical observation/judgment Functional Limitation: Self care Self Care Current Status (W1191): At least 80 percent but less than 100 percent impaired, limited or restricted Self Care Goal Status (Y7829): At least 20 percent but less than 40 percent impaired, limited or restricted   Kentrail Shew,HILLARY 06/30/2012, 5:13 PM [facility], OTR/L  [phone] 06/30/2012

## 2012-06-30 NOTE — Procedures (Signed)
I was present at this dialysis session. I have reviewed the session itself and made appropriate changes.   Vinson Moselle, MD [employer] 06/30/2012, 11:33 AM

## 2012-06-30 NOTE — Progress Notes (Signed)
[facility] PROGRESS NOTE  Claudia Rich ZOX:[id number] DOB: 05-Nov-1926 DOA: 06/25/2012 PCP: Lillia Abed, MD  Assessment/Plan: Traumatic pneumothorax  -secondary to fall  -PTX resolved per followup chest x-rays  -supplemental oxygen prn  -No distress or hypoxemia at this time  -No syncope  Continue incentive spirometry. Pain management. Pulmonary infiltrate  -Increasing basal opacities  -empiric moxifloxacin--plan 5 days--today is day #4/5  Closed rib fracture  -Continue opioids prn. Continue scheduled Tylenol. End-stage renal disease  -Left AV graft was clotted  -Patient is status post thrombectomy and revision of left AV graft  - Per renal. Hypertension  - BP borderline. Decrease metoprolol to 12.5mg  BID and amlodipine to 2.5mg  daily. Diabetes mellitus type 2  -Hold Januvia and glipizide for now due to procedures.  -NovoLog sliding scale  -Continue aspirin  Clotted AV graft  -S/P thrombectomy and revision of left upper extremity AV graft per vascular surgery. Follow. Disposition  -Dr. Arbutus Leas Discussed with the POA, [name]  -Plan is discharge home with 24-hour supervision with family  -Patient will then be transported to [facility] to stay with power of attorney, per Dr. Arbutus Leas after discharge. Per nephrology notes dialysis has been arranged for patient to [place].       Code Status: Full Family Communication: Updated patient. No family at bedside. Disposition Plan: Home with family with 24-hour supervision when medically stable.   Consultants:  Vascular surgery: Dr. Edilia Bo 06/28/2012  Nephrology: Dr. Arlean Hopping 06/27/2012  Interventional radiology Dr. Fredia Sorrow 06/27/2012  Procedures:  Thrombectomy and revision of left upper arm AV graft 06/28/2012 per Dr. Edilia Bo  Antibiotics:  None  HPI/Subjective: Patient complaining of left-sided rib pain which she states might be slightly better since admission.  Objective: Filed Vitals:   06/30/12 1041 06/30/12  1052 06/30/12 1115 06/30/12 1321  BP: 106/59 123/54 108/71 121/31  Pulse: 92 85 87 90  Temp:  98.3 F (36.8 C) 98.5 F (36.9 C) 97.9 F (36.6 C)  TempSrc:  Oral    Resp: 15 20 18 17   Height:      Weight:  68.3 kg (150 lb 9.2 oz)    SpO2:  98% 92% 94%    Intake/Output Summary (Last 24 hours) at 06/30/12 1535 Last data filed at 06/30/12 1300  Gross per 24 hour  Intake    330 ml  Output    996 ml  Net   -666 ml   Filed Weights   06/30/12 0650 06/30/12 0655 06/30/12 1052  Weight: 69.5 kg (153 lb 3.5 oz) 69.5 kg (153 lb 3.5 oz) 68.3 kg (150 lb 9.2 oz)    Exam:   General:  NAD  Cardiovascular: RRR. No lower extremity edema.  Respiratory: CTAB anterior lung fields  Abdomen: Soft, nontender, nondistended, positive bowel sounds.  Data Reviewed: Basic Metabolic Panel:  Lab 06/30/12 8119 06/29/12 0540 06/28/12 1949 06/28/12 0830 06/27/12 0530  NA 136 138 132* 130* 134*  K 3.8 3.9 3.9 4.1 3.8  CL 95* 99 94* 91* 94*  CO2 27 28 25 24 27   GLUCOSE 188* 115* 255* 141* 148*  BUN 30* 14 42* 36* 26*  CREATININE 7.92* 4.83* 9.48* 8.37* 6.48*  CALCIUM 8.6 8.3* 8.4 9.1 9.2  MG -- -- -- -- --  PHOS 3.1 -- 5.0* 3.6 --   Liver Function Tests:  Lab 06/30/12 0705 06/28/12 1949 06/28/12 0830  AST -- -- --  ALT -- -- --  ALKPHOS -- -- --  BILITOT -- -- --  PROT -- -- --  ALBUMIN 3.0* 2.8* 3.0*   No results found for this basename: LIPASE:5,AMYLASE:5 in the last 168 hours No results found for this basename: AMMONIA:5 in the last 168 hours CBC:  Lab 06/30/12 0705 06/28/12 1949 06/28/12 0830 06/27/12 0530 06/26/12 0513 06/25/12 2318 06/25/12 2140  WBC 8.3 8.6 8.0 10.0 11.9* -- --  NEUTROABS -- -- -- -- -- 12.2* 9.3*  HGB 9.4* 10.8* 12.0 12.4 11.9* -- --  HCT 29.1* 33.5* 36.0 38.2 37.2 -- --  MCV 98.0 98.0 97.3 97.7 99.2 -- --  PLT 127* 136* 126* 149* 146* -- --   Cardiac Enzymes: No results found for this basename: CKTOTAL:5,CKMB:5,CKMBINDEX:5,TROPONINI:5 in the last 168  hours BNP (last 3 results) No results found for this basename: PROBNP:3 in the last 8760 hours CBG:  Lab 06/30/12 1327 06/30/12 1115 06/29/12 2144 06/29/12 1634 06/29/12 1126  GLUCAP 288* 164* 240* 196* 153*    Recent Results (from the past 240 hour(s))  MRSA PCR SCREENING     Status: Normal   Collection Time   06/26/12  2:30 AM      Component Value Range Status Comment   MRSA by PCR NEGATIVE  NEGATIVE Final      Studies: Dg Chest Port 1 View  06/29/2012  *RADIOLOGY REPORT*  Clinical Data: Follow-up pneumothorax  PORTABLE CHEST - 1 VIEW  Comparison: Chest radiograph 11/05 1013, CT 06/25/2012  Findings: Left posterior rib fractures again demonstrated.  Stable left lower lobe effusion infiltrate.  No evidence of pneumothorax. Right lung is clear peri  IMPRESSION: The lung.  Stable exam chest.  To left lower lobe atelectasis and effusion.  03/08 multiple left rib fractures without evidence of pneumothorax.   Original Report Authenticated By: Genevive Bi, M.D.     Scheduled Meds:    . acetaminophen  500 mg Oral Q8H  . amLODipine  5 mg Oral Daily  . aspirin  81 mg Oral Daily  . atorvastatin  80 mg Oral q1800  . calcium acetate  667 mg Oral BID WC  . darbepoetin (ARANESP) injection - DIALYSIS  60 mcg Intravenous Q Thu-HD  . ferric gluconate (FERRLECIT/NULECIT) IV  62.5 mg Intravenous Q Thu-HD  . insulin aspart  0-15 Units Subcutaneous TID WC  . insulin aspart  0-5 Units Subcutaneous QHS  . metoprolol tartrate  25 mg Oral BID  . moxifloxacin  400 mg Oral Q2000  . [COMPLETED] pentafluoroprop-tetrafluoroeth      . sodium chloride  3 mL Intravenous Q12H  . sodium chloride  3 mL Intravenous Q12H  . [DISCONTINUED] amLODipine  10 mg Oral Daily   Continuous Infusions:   Principal Problem:  *Pneumothorax, traumatic Active Problems:  ANEMIA, NORMOCYTIC  End stage renal disease  CAD (coronary artery disease)  Fall at home  Closed rib fracture  Hypokalemia    Time spent: > 35  mins    [facility]  [facility] Pager [phone]. If 8PM-8AM, please contact night-coverage at [email or web address], password [email or web address] 06/30/2012, 3:35 PM  LOS: 5 days

## 2012-07-01 DIAGNOSIS — IMO0001 Reserved for inherently not codable concepts without codable children: Secondary | ICD-10-CM

## 2012-07-01 DIAGNOSIS — S270XXA Traumatic pneumothorax, initial encounter: Principal | ICD-10-CM

## 2012-07-01 LAB — CBC
Hemoglobin: 9.6 g/dL — ABNORMAL LOW (ref 12.0–15.0)
RBC: 3 MIL/uL — ABNORMAL LOW (ref 3.87–5.11)

## 2012-07-01 LAB — BASIC METABOLIC PANEL
GFR calc Af Amer: 7 mL/min — ABNORMAL LOW (ref >90)
GFR calc non Af Amer: 6 mL/min — ABNORMAL LOW (ref >90)
Potassium: 3.7 mEq/L (ref 3.5–5.1)
Sodium: 136 mEq/L (ref 135–145)

## 2012-07-01 MED ORDER — DOCUSATE SODIUM 100 MG PO CAPS
100.0000 mg | ORAL_CAPSULE | Freq: Two times a day (BID) | ORAL | Status: DC
  Administered 2012-07-01: 100 mg via ORAL
  Filled 2012-07-01: qty 1

## 2012-07-01 MED ORDER — ACETAMINOPHEN 500 MG PO TABS: 500.0000 mg | ORAL_TABLET | Freq: Three times a day (TID) | ORAL | Status: DC

## 2012-07-01 MED ORDER — METOPROLOL TARTRATE 12.5 MG HALF TABLET: 12.5000 mg | ORAL_TABLET | Freq: Two times a day (BID) | ORAL | Status: DC

## 2012-07-01 MED ORDER — POLYETHYLENE GLYCOL 3350 17 G PO PACK
17.0000 g | PACK | Freq: Once | ORAL | Status: AC
  Administered 2012-07-01: 17 g via ORAL
  Filled 2012-07-01: qty 1

## 2012-07-01 MED ORDER — AMLODIPINE BESYLATE 2.5 MG PO TABS: 2.5000 mg | ORAL_TABLET | Freq: Every day | ORAL | Status: DC

## 2012-07-01 MED ORDER — HYDROCODONE-ACETAMINOPHEN 5-325 MG PO TABS: 1.0000 | ORAL_TABLET | ORAL | Status: DC | PRN

## 2012-07-01 MED ORDER — DSS 100 MG PO CAPS: 100.0000 mg | ORAL_CAPSULE | Freq: Two times a day (BID) | ORAL | Status: DC

## 2012-07-01 NOTE — Progress Notes (Signed)
Physical Therapy Treatment Patient Details Name: Claudia Rich MRN: [id number] DOB: [birth year] Today's Date: 07/01/2012 Time: 4098-1191 PT Time Calculation (min): 24 min  PT Assessment / Plan / Recommendation Comments on Treatment Session  Pt. continues to make gradual gains in her mobility but will benefit from HHPT and 24 hour assist from her niece upon Dc.    Follow Up Recommendations  Supervision/Assistance - 24 hour;SNF     Does the patient have the potential to tolerate intense rehabilitation  No, Recommend SNF (pt. is declining and she plans to DC home with 24 hr. assist)  Barriers to Discharge        Equipment Recommendations  None recommended by PT;Other (comment) (apparently has RW and 3n1)    Recommendations for Other Services    Frequency Min 3X/week   Plan Discharge plan remains appropriate    Precautions / Restrictions Precautions Precautions: Fall Precaution Comments: L rib fx. pain Restrictions Weight Bearing Restrictions: No   Pertinent Vitals/Pain RN made aware of left rib pain, pt. Was repositioned and activity increased    Mobility  Bed Mobility Bed Mobility: Rolling Right;Right Sidelying to Sit;Sitting - Scoot to Edge of Bed Rolling Right: 4: Min assist Right Sidelying to Sit: 3: Mod assist Sitting - Scoot to Edge of Bed: 4: Min assist Sit to Supine: HOB elevated;With rail;3: Mod assist Details for Bed Mobility Assistance: Pt. needed technique and safety cues to reduce pain level with mobility.  Needed assist for LEs back into bed. Transfers Transfers: Sit to Stand;Stand to Sit Sit to Stand: 3: Mod assist;With upper extremity assist;From bed Stand to Sit: 3: Mod assist;With upper extremity assist;To bed Details for Transfer Assistance: Cues for technique and safety Ambulation/Gait Ambulation/Gait Assistance: 4: Min assist Ambulation Distance (Feet): 20 Feet Assistive device: Rolling walker Ambulation/Gait Assistance Details: Pt. had very slight  posterior tendancy intitially for which she needed anterior facilitation.  Needed some assist managing RW initially. Gait Pattern: Decreased step length - right;Decreased step length - left Gait velocity: decreased    Exercises Other Exercises Other Exercises: encouraged deep breathing and use of IS   PT Diagnosis:    PT Problem List:   PT Treatment Interventions:     PT Goals Acute Rehab PT Goals PT Goal: Supine/Side to Sit - Progress: Progressing toward goal PT Goal: Sit to Supine/Side - Progress: Progressing toward goal PT Goal: Sit to Stand - Progress: Progressing toward goal PT Goal: Stand to Sit - Progress: Progressing toward goal PT Goal: Ambulate - Progress: Progressing toward goal  Visit Information  Last PT Received On: 07/01/12 Assistance Needed: +1    Subjective Data  Subjective: Nervous about getting OOB bacause of pain, but agreeable.  Reports her niece is coming today to stay with her 24/7.   Cognition  Overall Cognitive Status: History of cognitive impairments - at baseline Area of Impairment: Problem solving Arousal/Alertness: Awake/alert Orientation Level: Appears intact for tasks assessed Behavior During Session: [facility] for tasks performed Cognition - Other Comments: Anxiety secondary to pain    Balance     End of Session PT - End of Session Equipment Utilized During Treatment: Gait belt Activity Tolerance: Patient limited by pain Patient left: with call [place]/phone within reach;in bed Nurse Communication: Mobility status   GP     Ferman Hamming 07/01/2012, 2:12 PM Weldon Picking PT Acute Rehab Services [phone] [place] [phone]

## 2012-07-01 NOTE — Discharge Summary (Signed)
Physician Discharge Summary  Claudia Rich ZOX:[id number] DOB: [birth year] DOA: 06/25/2012  PCP: Lillia Abed, MD  Admit date: 06/25/2012 Discharge date: 07/01/2012  Time spent: 60 minutes  Recommendations for Outpatient Follow-up:  1. Patient is to follow up with PCP one week post discharge. 2. Patient is to follow up in hemodialysis as scheduled on Saturday, 07/02/2012  Discharge Diagnoses:  Principal Problem:  *Pneumothorax, traumatic Active Problems:  ANEMIA, NORMOCYTIC  End stage renal disease  CAD (coronary artery disease)  Fall at home  Closed rib fracture  Hypokalemia   Discharge Condition: stable and improved  Diet recommendation: renal diet  Filed Weights   06/30/12 0655 06/30/12 1052 06/30/12 2034  Weight: 69.5 kg (153 lb 3.5 oz) 68.3 kg (150 lb 9.2 oz) 69.5 kg (153 lb 3.5 oz)    History of present illness:  78 yo female h/o esrd got up to go to bathroom tonight and fell and got weak and fell against the sink and hit left side of her chest. Comes in with left chest pain with deep inspiration or cough. Normal state of health prior to this. Had dialysis today and says she did well. Denies loc but pt is really not sure. Denies any fever/n/v/d. No focal neuro def right now. Found to have mult rib fracture and small pnx. Seen by trauma surgery already. Also had markedly abnormal labs with v low k this has been repeated which is much different from first labs drawn???   Hospital Course:  Traumatic pneumothorax  Patient was admitted with a fall and left chest pain. Chest x-ray which was done did show 3 rib fractures on the left as well as a pneumothorax patient was monitored she was placed on supportive care pain management and incentive spirometry with daily x-rays. Pneumothorax decreased in size and result in the hospitalization. Patient was seen by trauma surgeryfelt no chest tube was needed at that time and conservative treatment was needed. Patient was monitored she  improved clinically however did have left rib pain. Patient was placed on scheduled Tylenol, incentive spirometry and pain management. Patient was discharged home in stable and improved condition. Pulmonary infiltrate  During the hospitalization chest x-ray which was done did show increasing basilar opacities. Patient was placed empirically on moxifloxacin and received 5 days of treatment during the hospitalization. On day of discharge patient was afebrile, white count was normal patient had a normal pulse with normal respirations. Patient was asymptomatic. Patient to be discharged home in stable condition off of antibiotics.  End-stage renal disease/clotted AV graft During the hospitalization patient was seen by nephrology 4 dialysis as patient was end-stage renal disease. Patient was noted to have clotted access in the AV graft in the left upper extremity without bruit or thrill. Patient was evaluated by interventional radiology for declotting.a vascular surgical consultation was obtained and patient was seen in consultation by Dr. Durwin Nora on 06/28/2012 for declotting and revision if necessary. Patient subsequently underwent thrombectomy and revision of the left upper extremity AV graft. Patient subsequently underwent hemodialysis with the revised left upper extremity AV graft without any complications. Patient was followed by nephrology and was deemed stable for discharge. Patient will be discharged home with family and is to follow up at hemodialysis. Patient be discharged in stable condition.  Hypertension  During hospitalization patient was noted to have borderline blood pressure. Patient's metoprolol dose was decreased to 12.5 mg daily. Patient's Norvasc was also decreased to 2.5 mg daily. Patient's blood pressure stabilized and should be discharged  in stable and improved condition.  The rest of patient's chronic medical issues remained stable throughout the  hospitalization.   Consultations: Vascular surgery: Dr. Edilia Bo 06/28/2012  Nephrology: Dr. Arlean Hopping 06/27/2012  Interventional radiology Dr. Fredia Sorrow 06/27/2012   Discharge Exam: Filed Vitals:   06/30/12 2034 07/01/12 0454 07/01/12 1000 07/01/12 1400  BP: 101/53 119/65 112/50 118/50  Pulse: 78 80 96 86  Temp: 99 F (37.2 C) 99.1 F (37.3 C) 98 F (36.7 C) 98.6 F (37 C)  TempSrc: Oral Oral Oral Oral  Resp: 16 16 18 18   Height:      Weight: 69.5 kg (153 lb 3.5 oz)     SpO2: 97% 90% 92% 98%    General: NAD Cardiovascular: RRR Respiratory: CTAB  Discharge Instructions  Discharge Orders    Future Orders Please Complete By Expires   Diet Carb Modified      Comments:   Renal diet   Increase activity slowly      Discharge instructions      Comments:   Follow up with Lillia Abed, MD in 1 week Follow up at dialysis center as scheduled on Saturday.       Medication List     As of 07/01/2012  5:43 PM    TAKE these medications         acetaminophen 500 MG tablet   Commonly known as: TYLENOL   Take 1 tablet (500 mg total) by mouth every 8 (eight) hours. Take for 5 days then use as needed.      albuterol 108 (90 BASE) MCG/ACT inhaler   Commonly known as: PROVENTIL HFA;VENTOLIN HFA   Inhale 2 puffs into the lungs every 6 (six) hours as needed. Shortness of breath      amLODipine 2.5 MG tablet   Commonly known as: NORVASC   Take 1 tablet (2.5 mg total) by mouth daily.      aspirin 81 MG chewable tablet   Chew 81 mg by mouth daily.      atorvastatin 80 MG tablet   Commonly known as: LIPITOR   Take 1 tablet (80 mg total) by mouth daily.      calcium acetate 667 MG capsule   Commonly known as: PHOSLO   Take 667 mg by mouth 2 (two) times daily.      DSS 100 MG Caps   Take 100 mg by mouth 2 (two) times daily.      glipiZIDE 10 MG tablet   Commonly known as: GLUCOTROL   Take 2 tablets (20 mg total) by mouth 2 (two) times daily before a meal.       HYDROcodone-acetaminophen 5-325 MG per tablet   Commonly known as: NORCO/VICODIN   Take 1-2 tablets by mouth every 4 (four) hours as needed for pain.      metoprolol tartrate 12.5 mg Tabs   Commonly known as: LOPRESSOR   Take 0.5 tablets (12.5 mg total) by mouth 2 (two) times daily.      ondansetron 4 MG tablet   Commonly known as: ZOFRAN   Take 4 mg by mouth every 8 (eight) hours as needed. For nausea.      ranitidine 150 MG tablet   Commonly known as: ZANTAC   Take 1 tablet (150 mg total) by mouth 2 (two) times daily.      RENAL MULTIVITAMIN FORMULA PO   Take 1 tablet by mouth daily.      sitaGLIPtin 25 MG tablet   Commonly known as: JANUVIA  Take 1 tablet (25 mg total) by mouth daily.           Follow-up Information    Follow up with Lillia Abed, MD. Schedule an appointment as soon as possible for a visit in 1 week.   Contact information:   [address] [place] [address] [phone]       Please follow up. (f/u at HD center as scheduled.)           The results of significant diagnostics from this hospitalization (including imaging, microbiology, ancillary and laboratory) are listed below for reference.    Significant Diagnostic Studies: Dg Chest 2 View  06/28/2012  *RADIOLOGY REPORT*  Clinical Data: Evaluate for pneumothorax  CHEST - 2 VIEW  Comparison: 06/27/2012  Findings: Heart size is mildly enlarged.  Small pleural effusions appear increased from previous exam.  Persistent atelectasis is noted within both lung bases.  No pneumothorax.  Minimally displaced left rib fractures appears stable.  IMPRESSION:  1.  Increase in effusions. 2.  No pneumothorax noted.,   Original Report Authenticated By: Signa Kell, M.D.    Dg Chest 2 View  06/27/2012  *RADIOLOGY REPORT*  Clinical Data: Check rib fractures, pneumothorax  CHEST - 2 VIEW  Comparison: 07/23/2012  Findings: The cardiac shadow is stable.  Multiple left-sided rib fractures are again seen.  A  left arm stent is noted.  No significant pneumothorax is seen. Mild persistent left basilar changes are seen.  A small left-sided pleural effusion is noted new from prior exam.  IMPRESSION: No significant pneumothorax is identified.  A new small left effusion is noted. Stable bibasilar changes.  The rib fractures appear stable.   Original Report Authenticated By: Alcide Clever, M.D.    Dg Chest 2 View  06/26/2012  *RADIOLOGY REPORT*  Clinical Data: Left-sided rib fractures.  Pneumothorax.  CHEST - 2 VIEW  Comparison: Chest x-ray 06/25/2012.  Findings: There is a tiny persistent left apical pneumothorax (less than 5% of the volume of the left hemithorax).  Extensive bilateral lower lobe interstitial and airspace disease, concerning for sequelae of aspiration.  Trace bilateral pleural effusions. Pulmonary venous congestion without frank pulmonary edema. Cardiomegaly is unchanged. The patient is rotated to the right on today's exam, resulting in distortion of the mediastinal contours and reduced diagnostic sensitivity and specificity for mediastinal pathology.  Atherosclerosis of the thoracic aorta.  Multiple left- sided rib fractures again noted.  Vascular stent in the left upper extremity soft tissues.  IMPRESSION: 1.  Multiple left-sided rib fractures with a tiny (decreasing) left apical pneumothorax. 2.  Bibasilar interstitial and airspace disease concerning for sequela of aspiration. 3.  Cardiomegaly is unchanged. 4.  Atherosclerosis.   Original Report Authenticated By: Trudie Reed, M.D.    Ct Chest W Contrast  06/25/2012  *RADIOLOGY REPORT*  Clinical Data:  The patient fell today and complains of left rib pain.  Left upper quadrant pain.  CT CHEST, ABDOMEN AND PELVIS WITH CONTRAST  Technique:  Multidetector CT imaging of the chest, abdomen and pelvis was performed following the standard protocol during bolus administration of intravenous contrast.  Contrast: OMNIPAQUE IOHEXOL 300 MG/ML  SOLN   Comparison:   None.  CT CHEST  Findings:  Mild cardiac enlargement.  Coronary artery calcification.  Normal caliber thoracic aorta with calcification. No significant mediastinal lymphadenopathy.  The esophagus is decompressed.  Small esophageal hiatal hernia.  There is a small left pleural effusion with small left pneumothorax.  There is a fibrosis or atelectasis  in the lung bases with bronchial wall thickening suggesting chronic bronchitis. Airways appear patent.  Normal alignment of the thoracic spine without vertebral compression deformity.  The sternum appears intact.  Comminuted fractures of the left posterior and lateral seventh and eighth ribs with fractures of multiple locations and depression of fractures of the seventh rib posteriorly.  IMPRESSION: Comminuted and displaced fractures of the left seventh and eighth ribs with small left pleural effusion and small left pneumothorax. Consolidation or atelectasis in the lung bases with chronic bronchitic changes.  CT ABDOMEN AND PELVIS  Findings:  Normal alignment of the lumbar vertebrae without compression deformity.  Degenerative changes in the lumbar spine with degenerative disc disease.  The sacrum, pelvis, and hips appear intact.  The liver, spleen, gallbladder, adrenal glands, and retroperitoneal lymph nodes are unremarkable.  There is a circumscribed appearing cystic lesion in the tail of the pancreas measuring about 1.6 cm diameter.  This could represent a simple cyst or cystic neoplasm. 56-month follow-up study is recommended.  Small accessory spleen. The kidneys are atrophic without evidence of solid mass or hydronephrosis.  Calcification of the aorta without aneurysm.  The stomach, small bowel, and colon are not abnormally distended and there is no appreciable wall thickening.  No free air or free fluid in the abdomen.  No mesenteric or retroperitoneal fluid collections.  Pelvis:  The bladder wall is not thickened.  The uterus appears to be surgically  absent.  No abnormal adnexal masses.  No free or loculated pelvic fluid collections.  No evidence of diverticulitis. The appendix is not identified.  Abdominal wall musculature appears intact.  IMPRESSION: No acute post-traumatic changes demonstrated in the abdomen or pelvis.  No evidence of solid organ injury or bowel perforation. There is incidental note of a 1.6 cm diameter cystic lesion in the tail of the pancreas.  23-month follow-up is recommended.  Results were discussed by telephone at 2157 hours on 06/25/2012 with Dr. Ambrose Mantle.   Original Report Authenticated By: Burman Nieves, M.D.    Ct Abdomen Pelvis W Contrast  06/25/2012  *RADIOLOGY REPORT*  Clinical Data:  The patient fell today and complains of left rib pain.  Left upper quadrant pain.  CT CHEST, ABDOMEN AND PELVIS WITH CONTRAST  Technique:  Multidetector CT imaging of the chest, abdomen and pelvis was performed following the standard protocol during bolus administration of intravenous contrast.  Contrast: OMNIPAQUE IOHEXOL 300 MG/ML  SOLN  Comparison:   None.  CT CHEST  Findings:  Mild cardiac enlargement.  Coronary artery calcification.  Normal caliber thoracic aorta with calcification. No significant mediastinal lymphadenopathy.  The esophagus is decompressed.  Small esophageal hiatal hernia.  There is a small left pleural effusion with small left pneumothorax.  There is a fibrosis or atelectasis in the lung bases with bronchial wall thickening suggesting chronic bronchitis. Airways appear patent.  Normal alignment of the thoracic spine without vertebral compression deformity.  The sternum appears intact.  Comminuted fractures of the left posterior and lateral seventh and eighth ribs with fractures of multiple locations and depression of fractures of the seventh rib posteriorly.  IMPRESSION: Comminuted and displaced fractures of the left seventh and eighth ribs with small left pleural effusion and small left pneumothorax. Consolidation or  atelectasis in the lung bases with chronic bronchitic changes.  CT ABDOMEN AND PELVIS  Findings:  Normal alignment of the lumbar vertebrae without compression deformity.  Degenerative changes in the lumbar spine with degenerative disc disease.  The sacrum, pelvis, and hips  appear intact.  The liver, spleen, gallbladder, adrenal glands, and retroperitoneal lymph nodes are unremarkable.  There is a circumscribed appearing cystic lesion in the tail of the pancreas measuring about 1.6 cm diameter.  This could represent a simple cyst or cystic neoplasm. 34-month follow-up study is recommended.  Small accessory spleen. The kidneys are atrophic without evidence of solid mass or hydronephrosis.  Calcification of the aorta without aneurysm.  The stomach, small bowel, and colon are not abnormally distended and there is no appreciable wall thickening.  No free air or free fluid in the abdomen.  No mesenteric or retroperitoneal fluid collections.  Pelvis:  The bladder wall is not thickened.  The uterus appears to be surgically absent.  No abnormal adnexal masses.  No free or loculated pelvic fluid collections.  No evidence of diverticulitis. The appendix is not identified.  Abdominal wall musculature appears intact.  IMPRESSION: No acute post-traumatic changes demonstrated in the abdomen or pelvis.  No evidence of solid organ injury or bowel perforation. There is incidental note of a 1.6 cm diameter cystic lesion in the tail of the pancreas.  49-month follow-up is recommended.  Results were discussed by telephone at 2157 hours on 06/25/2012 with Dr. Ambrose Mantle.   Original Report Authenticated By: Burman Nieves, M.D.    Dg Chest Port 1 View  06/29/2012  *RADIOLOGY REPORT*  Clinical Data: Follow-up pneumothorax  PORTABLE CHEST - 1 VIEW  Comparison: Chest radiograph 11/05 1013, CT 06/25/2012  Findings: Left posterior rib fractures again demonstrated.  Stable left lower lobe effusion infiltrate.  No evidence of pneumothorax. Right  lung is clear peri  IMPRESSION: The lung.  Stable exam chest.  To left lower lobe atelectasis and effusion.  03/08 multiple left rib fractures without evidence of pneumothorax.   Original Report Authenticated By: Genevive Bi, M.D.    Dg Chest Portable 1 View  06/25/2012  *RADIOLOGY REPORT*  Clinical Data: Fall.  Left rib pain.  PORTABLE CHEST - 1 VIEW  Comparison: 09/28/2011  Findings: Multiple left-sided rib fractures.  This involves ribs six through eight.  There appear to be fractures both posteriorly and laterally of ribs seven and eight.  There is question of a small left apical pneumothorax.  Cardiomegaly.  Bibasilar atelectasis and small left pleural effusion.  IMPRESSION: Multiple left-sided rib fractures as above.  Small associated left pleural effusion.  Suspect tiny left apical pneumothorax.  Cardiomegaly.   Original Report Authenticated By: Charlett Nose, M.D.     Microbiology: Recent Results (from the past 240 hour(s))  MRSA PCR SCREENING     Status: Normal   Collection Time   06/26/12  2:30 AM      Component Value Range Status Comment   MRSA by PCR NEGATIVE  NEGATIVE Final      Labs: Basic Metabolic Panel:  Lab 07/01/12 1610 06/30/12 0705 06/29/12 0540 06/28/12 1949 06/28/12 0830  NA 136 136 138 132* 130*  K 3.7 3.8 3.9 3.9 4.1  CL 97 95* 99 94* 91*  CO2 30 27 28 25 24   GLUCOSE 158* 188* 115* 255* 141*  BUN 16 30* 14 42* 36*  CREATININE 5.66* 7.92* 4.83* 9.48* 8.37*  CALCIUM 8.6 8.6 8.3* 8.4 9.1  MG -- -- -- -- --  PHOS -- 3.1 -- 5.0* 3.6   Liver Function Tests:  Lab 06/30/12 0705 06/28/12 1949 06/28/12 0830  AST -- -- --  ALT -- -- --  ALKPHOS -- -- --  BILITOT -- -- --  PROT -- -- --  ALBUMIN 3.0* 2.8* 3.0*   No results found for this basename: LIPASE:5,AMYLASE:5 in the last 168 hours No results found for this basename: AMMONIA:5 in the last 168 hours CBC:  Lab 07/01/12 0550 06/30/12 0705 06/28/12 1949 06/28/12 0830 06/27/12 0530 06/25/12 2318 06/25/12  2140  WBC 8.3 8.3 8.6 8.0 10.0 -- --  NEUTROABS -- -- -- -- -- 12.2* 9.3*  HGB 9.6* 9.4* 10.8* 12.0 12.4 -- --  HCT 29.8* 29.1* 33.5* 36.0 38.2 -- --  MCV 99.3 98.0 98.0 97.3 97.7 -- --  PLT 134* 127* 136* 126* 149* -- --   Cardiac Enzymes: No results found for this basename: CKTOTAL:5,CKMB:5,CKMBINDEX:5,TROPONINI:5 in the last 168 hours BNP: BNP (last 3 results) No results found for this basename: PROBNP:3 in the last 8760 hours CBG:  Lab 07/01/12 1704 07/01/12 1125 07/01/12 0806 06/30/12 2038 06/30/12 1631  GLUCAP 170* 218* 246* 170* 218*       Signed:  THOMPSON,DANIEL  [facility] 07/01/2012, 5:43 PM

## 2012-07-01 NOTE — Progress Notes (Signed)
[facility] notified of the patient's d/c from the hospital - orders for home health reviewed and confirmed with the dr orders

## 2012-07-01 NOTE — Progress Notes (Signed)
Subjective:   A little oozing of blood from axillary wound on L. Getting up more, sitting on side of bed today. Taking mostly vicodin for pain, occ MSO4. No BM since last Sat   Objective: Vital signs in last 24 hours: Temp:  [97.9 F (36.6 C)-99.1 F (37.3 C)] 99.1 F (37.3 C) (11/08 0454) Pulse Rate:  [73-92] 80  (11/08 0454) Resp:  [12-20] 16  (11/08 0454) BP: (94-123)/(31-71) 119/65 mmHg (11/08 0454) SpO2:  [90 %-98 %] 90 % (11/08 0454) Weight:  [68.3 kg (150 lb 9.2 oz)-69.5 kg (153 lb 3.5 oz)] 69.5 kg (153 lb 3.5 oz) (11/07 2034) Weight change: -4.5 kg (-9 lb 14.7 oz)  Intake/Output from previous day: 11/07 0701 - 11/08 0700 In: 135 [P.O.:30; IV Piggyback:105] Out: 996    EXAM: General appearance:  Alert, in no apparent distress Resp:  Poor effort, secondary to pain, but clear Cardio:  RRR without murmur or rub GI: + BS, soft and nontender Extremities:  No edema Access:  AVG @ LUA with BFR 400 cc/min, mild blood ooze from L axillary wound  Lab Results:  [facility] 07/01/12 0550 06/30/12 0705  WBC 8.3 8.3  HGB 9.6* 9.4*  HCT 29.8* 29.1*  PLT 134* 127*   BMET:   Basename 07/01/12 0550 06/30/12 0705 06/28/12 1949  NA 136 136 --  K 3.7 3.8 --  CL 97 95* --  CO2 30 27 --  GLUCOSE 158* 188* --  BUN 16 30* --  CREATININE 5.66* 7.92* --  CALCIUM 8.6 8.6 --  ALBUMIN -- 3.0* 2.8*   No results found for this basename: PTH:2 in the last 72 hours Iron Studies: No results found for this basename: IRON,TIBC,TRANSFERRIN,FERRITIN in the last 72 hours  Dialysis Orders: Center: [facility] on TTS.  EDW 68.5 kg HD Bath 2K/2.25Ca Time 3 hrs 45 mins Heparin 9000 U. Access AVG @ LUA BFR 400 DFR A1.5  Zemplar 4 mcg IV/HD Epogen 2600 Units IV/HD Venofer 100 mg on Thurs.   Assessment/Recs: 1. Multiple rib fractures/small PTX- significant pain with movement and palpation. PTX resolved. Stop IV narcotics, start colace and give Miralax now.  2. Clotted access - s/p thrombectomy and revision  of AVG @ [facility] on 11/5; oozing some, will ask surg to look at it.  3. ESRD - HD on TTS @ [facility]; K 3.9 today.  4. Hypertension/volume - norvasc/metoprolol. 1 kg over EDW. Stable.  5. Anemia - on outpatient Epogen 2600 U and weekly iron IV. 6. Metabolic bone disease - Ca 8.3, P 5, last iPTH 233 on 10/17; on Zemplar 4 mcg, Phoslo 2 with meals and 1 with snacks. 7. Nutrition - Alb 2.8. 8. DM Type 2 - on Januvia and Glucotrol. 9. Hx CVA - TIA with [facility] in 10/2010. 10. Disposition- OK for d/c today from renal standpoint

## 2012-07-02 ENCOUNTER — Encounter (HOSPITAL_COMMUNITY): Payer: Self-pay | Admitting: *Deleted

## 2012-07-02 ENCOUNTER — Other Ambulatory Visit: Payer: Self-pay | Admitting: Family Medicine

## 2012-07-02 ENCOUNTER — Emergency Department (HOSPITAL_COMMUNITY): Payer: Medicare HMO

## 2012-07-02 ENCOUNTER — Inpatient Hospital Stay (HOSPITAL_COMMUNITY)
Admission: EM | Admit: 2012-07-02 | Discharge: 2012-07-05 | DRG: 193 | Disposition: A | Discharge status: Home Health | Payer: Medicare HMO | Attending: Internal Medicine | Admitting: Internal Medicine

## 2012-07-02 ENCOUNTER — Inpatient Hospital Stay (HOSPITAL_COMMUNITY): Payer: Medicare HMO

## 2012-07-02 DIAGNOSIS — S2249XA Multiple fractures of ribs, unspecified side, initial encounter for closed fracture: Secondary | ICD-10-CM | POA: Diagnosis present

## 2012-07-02 DIAGNOSIS — Z8673 Personal history of transient ischemic attack (TIA), and cerebral infarction without residual deficits: Secondary | ICD-10-CM

## 2012-07-02 DIAGNOSIS — J189 Pneumonia, unspecified organism: Principal | ICD-10-CM | POA: Diagnosis present

## 2012-07-02 DIAGNOSIS — Z992 Dependence on renal dialysis: Secondary | ICD-10-CM

## 2012-07-02 DIAGNOSIS — E785 Hyperlipidemia, unspecified: Secondary | ICD-10-CM | POA: Diagnosis present

## 2012-07-02 DIAGNOSIS — Z8719 Personal history of other diseases of the digestive system: Secondary | ICD-10-CM

## 2012-07-02 DIAGNOSIS — Z860100 Personal history of colon polyps, unspecified: Secondary | ICD-10-CM

## 2012-07-02 DIAGNOSIS — K219 Gastro-esophageal reflux disease without esophagitis: Secondary | ICD-10-CM | POA: Diagnosis present

## 2012-07-02 DIAGNOSIS — D638 Anemia in other chronic diseases classified elsewhere: Secondary | ICD-10-CM | POA: Diagnosis present

## 2012-07-02 DIAGNOSIS — Y95 Nosocomial condition: Secondary | ICD-10-CM | POA: Diagnosis present

## 2012-07-02 DIAGNOSIS — E1165 Type 2 diabetes mellitus with hyperglycemia: Secondary | ICD-10-CM | POA: Diagnosis present

## 2012-07-02 DIAGNOSIS — N186 End stage renal disease: Secondary | ICD-10-CM | POA: Diagnosis present

## 2012-07-02 DIAGNOSIS — Z79899 Other long term (current) drug therapy: Secondary | ICD-10-CM

## 2012-07-02 DIAGNOSIS — IMO0001 Reserved for inherently not codable concepts without codable children: Secondary | ICD-10-CM | POA: Diagnosis present

## 2012-07-02 DIAGNOSIS — Y841 Kidney dialysis as the cause of abnormal reaction of the patient, or of later complication, without mention of misadventure at the time of the procedure: Secondary | ICD-10-CM | POA: Diagnosis present

## 2012-07-02 DIAGNOSIS — H409 Unspecified glaucoma: Secondary | ICD-10-CM | POA: Diagnosis present

## 2012-07-02 DIAGNOSIS — J90 Pleural effusion, not elsewhere classified: Secondary | ICD-10-CM | POA: Diagnosis present

## 2012-07-02 DIAGNOSIS — F0390 Unspecified dementia without behavioral disturbance: Secondary | ICD-10-CM | POA: Diagnosis present

## 2012-07-02 DIAGNOSIS — I739 Peripheral vascular disease, unspecified: Secondary | ICD-10-CM | POA: Diagnosis present

## 2012-07-02 DIAGNOSIS — N184 Chronic kidney disease, stage 4 (severe): Secondary | ICD-10-CM

## 2012-07-02 DIAGNOSIS — Y92009 Unspecified place in unspecified non-institutional (private) residence as the place of occurrence of the external cause: Secondary | ICD-10-CM

## 2012-07-02 DIAGNOSIS — Z888 Allergy status to other drugs, medicaments and biological substances status: Secondary | ICD-10-CM

## 2012-07-02 DIAGNOSIS — Z90710 Acquired absence of both cervix and uterus: Secondary | ICD-10-CM

## 2012-07-02 DIAGNOSIS — I252 Old myocardial infarction: Secondary | ICD-10-CM

## 2012-07-02 DIAGNOSIS — Z7982 Long term (current) use of aspirin: Secondary | ICD-10-CM

## 2012-07-02 DIAGNOSIS — R0789 Other chest pain: Secondary | ICD-10-CM

## 2012-07-02 DIAGNOSIS — I120 Hypertensive chronic kidney disease with stage 5 chronic kidney disease or end stage renal disease: Secondary | ICD-10-CM | POA: Diagnosis present

## 2012-07-02 DIAGNOSIS — T82898A Other specified complication of vascular prosthetic devices, implants and grafts, initial encounter: Secondary | ICD-10-CM | POA: Diagnosis present

## 2012-07-02 DIAGNOSIS — R609 Edema, unspecified: Secondary | ICD-10-CM

## 2012-07-02 DIAGNOSIS — N2581 Secondary hyperparathyroidism of renal origin: Secondary | ICD-10-CM | POA: Diagnosis present

## 2012-07-02 DIAGNOSIS — N189 Chronic kidney disease, unspecified: Secondary | ICD-10-CM

## 2012-07-02 DIAGNOSIS — Z886 Allergy status to analgesic agent status: Secondary | ICD-10-CM

## 2012-07-02 DIAGNOSIS — R600 Localized edema: Secondary | ICD-10-CM

## 2012-07-02 DIAGNOSIS — S40029A Contusion of unspecified upper arm, initial encounter: Secondary | ICD-10-CM | POA: Diagnosis present

## 2012-07-02 DIAGNOSIS — Z885 Allergy status to narcotic agent status: Secondary | ICD-10-CM

## 2012-07-02 DIAGNOSIS — W19XXXA Unspecified fall, initial encounter: Secondary | ICD-10-CM | POA: Diagnosis present

## 2012-07-02 DIAGNOSIS — S270XXA Traumatic pneumothorax, initial encounter: Secondary | ICD-10-CM | POA: Diagnosis present

## 2012-07-02 LAB — CBC
HCT: 30.8 % — ABNORMAL LOW (ref 36.0–46.0)
Hemoglobin: 10.1 g/dL — ABNORMAL LOW (ref 12.0–15.0)
MCH: 32.2 pg (ref 26.0–34.0)
MCH: 32.4 pg (ref 26.0–34.0)
MCHC: 32.8 g/dL (ref 30.0–36.0)
MCHC: 33.7 g/dL (ref 30.0–36.0)
MCV: 96.2 fL (ref 78.0–100.0)
Platelets: 182 10*3/uL (ref 150–400)
RDW: 13.9 % (ref 11.5–15.5)

## 2012-07-02 LAB — GLUCOSE, CAPILLARY: Glucose-Capillary: 159 mg/dL — ABNORMAL HIGH (ref 70–99)

## 2012-07-02 LAB — BASIC METABOLIC PANEL
BUN: 25 mg/dL — ABNORMAL HIGH (ref 6–23)
Calcium: 9 mg/dL (ref 8.4–10.5)
Creatinine, Ser: 7.47 mg/dL — ABNORMAL HIGH (ref 0.50–1.10)
GFR calc non Af Amer: 4 mL/min — ABNORMAL LOW (ref >90)
Glucose, Bld: 188 mg/dL — ABNORMAL HIGH (ref 70–99)

## 2012-07-02 MED ORDER — PARICALCITOL 5 MCG/ML IV SOLN
4.0000 ug | INTRAVENOUS | Status: DC
  Administered 2012-07-05: 4 ug via INTRAVENOUS
  Filled 2012-07-02: qty 0.8

## 2012-07-02 MED ORDER — INSULIN ASPART 100 UNIT/ML ~~LOC~~ SOLN
0.0000 [IU] | Freq: Every day | SUBCUTANEOUS | Status: DC
  Administered 2012-07-03: 2 [IU] via SUBCUTANEOUS

## 2012-07-02 MED ORDER — ACETAMINOPHEN 500 MG PO TABS
500.0000 mg | ORAL_TABLET | Freq: Three times a day (TID) | ORAL | Status: DC
  Administered 2012-07-02 – 2012-07-05 (×11): 500 mg via ORAL
  Filled 2012-07-02 (×14): qty 1

## 2012-07-02 MED ORDER — DEXTROSE 5 % IV SOLN
2.0000 g | INTRAVENOUS | Status: DC
  Filled 2012-07-02: qty 2

## 2012-07-02 MED ORDER — VANCOMYCIN HCL 1000 MG IV SOLR
1500.0000 mg | Freq: Once | INTRAVENOUS | Status: AC
  Administered 2012-07-02: 1500 mg via INTRAVENOUS
  Filled 2012-07-02 (×2): qty 1500

## 2012-07-02 MED ORDER — HYDROCODONE-ACETAMINOPHEN 5-325 MG PO TABS
ORAL_TABLET | ORAL | Status: AC
  Filled 2012-07-02: qty 1

## 2012-07-02 MED ORDER — DEXTROSE 5 % IV SOLN
2.0000 g | Freq: Once | INTRAVENOUS | Status: AC
  Administered 2012-07-02: 2 g via INTRAVENOUS
  Filled 2012-07-02: qty 2

## 2012-07-02 MED ORDER — GUAIFENESIN 100 MG/5ML PO SOLN
5.0000 mL | ORAL | Status: DC | PRN
  Administered 2012-07-03 – 2012-07-04 (×3): 100 mg via ORAL
  Filled 2012-07-02 (×3): qty 5

## 2012-07-02 MED ORDER — LEVOFLOXACIN IN D5W 500 MG/100ML IV SOLN
500.0000 mg | Freq: Once | INTRAVENOUS | Status: AC
  Administered 2012-07-04: 500 mg via INTRAVENOUS
  Filled 2012-07-02: qty 100

## 2012-07-02 MED ORDER — SODIUM CHLORIDE 0.9 % IV SOLN: 250.0000 mL | INTRAVENOUS | Status: DC | PRN

## 2012-07-02 MED ORDER — SODIUM CHLORIDE 0.9 % IJ SOLN
3.0000 mL | Freq: Two times a day (BID) | INTRAMUSCULAR | Status: DC
  Administered 2012-07-02 – 2012-07-05 (×7): 3 mL via INTRAVENOUS

## 2012-07-02 MED ORDER — AMLODIPINE BESYLATE 2.5 MG PO TABS
2.5000 mg | ORAL_TABLET | Freq: Every day | ORAL | Status: DC
  Filled 2012-07-02: qty 1

## 2012-07-02 MED ORDER — ASPIRIN 81 MG PO CHEW
81.0000 mg | CHEWABLE_TABLET | Freq: Every day | ORAL | Status: DC
  Administered 2012-07-02 – 2012-07-05 (×4): 81 mg via ORAL
  Filled 2012-07-02 (×3): qty 1

## 2012-07-02 MED ORDER — ATORVASTATIN CALCIUM 80 MG PO TABS
80.0000 mg | ORAL_TABLET | Freq: Every day | ORAL | Status: DC
  Administered 2012-07-02 – 2012-07-05 (×4): 80 mg via ORAL
  Filled 2012-07-02 (×4): qty 1

## 2012-07-02 MED ORDER — SODIUM CHLORIDE 0.9 % IJ SOLN: 3.0000 mL | INTRAMUSCULAR | Status: DC | PRN

## 2012-07-02 MED ORDER — FAMOTIDINE 20 MG PO TABS
20.0000 mg | ORAL_TABLET | Freq: Every day | ORAL | Status: DC
  Administered 2012-07-02 – 2012-07-05 (×4): 20 mg via ORAL
  Filled 2012-07-02 (×4): qty 1

## 2012-07-02 MED ORDER — DEXTROSE 5 % IV SOLN: 1.0000 g | Freq: Three times a day (TID) | INTRAVENOUS | Status: DC

## 2012-07-02 MED ORDER — INSULIN ASPART 100 UNIT/ML ~~LOC~~ SOLN
0.0000 [IU] | Freq: Three times a day (TID) | SUBCUTANEOUS | Status: DC
  Administered 2012-07-02 (×2): 3 [IU] via SUBCUTANEOUS
  Administered 2012-07-02 – 2012-07-03 (×3): 2 [IU] via SUBCUTANEOUS
  Administered 2012-07-03: 3 [IU] via SUBCUTANEOUS
  Administered 2012-07-04 – 2012-07-05 (×4): 2 [IU] via SUBCUTANEOUS

## 2012-07-02 MED ORDER — METOPROLOL TARTRATE 12.5 MG HALF TABLET
12.5000 mg | ORAL_TABLET | Freq: Two times a day (BID) | ORAL | Status: DC
  Administered 2012-07-02 – 2012-07-05 (×6): 12.5 mg via ORAL
  Filled 2012-07-02 (×8): qty 1

## 2012-07-02 MED ORDER — SODIUM CHLORIDE 0.9 % IV SOLN
125.0000 mg | INTRAVENOUS | Status: DC
  Administered 2012-07-05: 125 mg via INTRAVENOUS
  Filled 2012-07-02 (×2): qty 10

## 2012-07-02 MED ORDER — VANCOMYCIN HCL 1000 MG IV SOLR: 750.0000 mg | INTRAVENOUS | Status: DC

## 2012-07-02 MED ORDER — VANCOMYCIN HCL 1000 MG IV SOLR
1500.0000 mg | Freq: Once | INTRAVENOUS | Status: DC
  Filled 2012-07-02: qty 1500

## 2012-07-02 MED ORDER — ALBUTEROL SULFATE HFA 108 (90 BASE) MCG/ACT IN AERS: 2.0000 | INHALATION_SPRAY | Freq: Four times a day (QID) | RESPIRATORY_TRACT | Status: DC | PRN

## 2012-07-02 MED ORDER — LEVOFLOXACIN IN D5W 750 MG/150ML IV SOLN
750.0000 mg | Freq: Once | INTRAVENOUS | Status: AC
  Administered 2012-07-02: 750 mg via INTRAVENOUS
  Filled 2012-07-02: qty 150

## 2012-07-02 MED ORDER — CALCIUM ACETATE 667 MG PO CAPS
667.0000 mg | ORAL_CAPSULE | Freq: Three times a day (TID) | ORAL | Status: DC
  Administered 2012-07-02 – 2012-07-05 (×8): 667 mg via ORAL
  Filled 2012-07-02 (×11): qty 1

## 2012-07-02 MED ORDER — DARBEPOETIN ALFA-POLYSORBATE 60 MCG/0.3ML IJ SOLN
60.0000 ug | INTRAMUSCULAR | Status: DC
  Administered 2012-07-05: 60 ug via INTRAVENOUS
  Filled 2012-07-02: qty 0.3

## 2012-07-02 MED ORDER — LEVOFLOXACIN IN D5W 750 MG/150ML IV SOLN: 750.0000 mg | INTRAVENOUS | Status: DC

## 2012-07-02 MED ORDER — HYDROCODONE-ACETAMINOPHEN 5-325 MG PO TABS
1.0000 | ORAL_TABLET | ORAL | Status: DC | PRN
  Administered 2012-07-02 – 2012-07-04 (×3): 1 via ORAL
  Administered 2012-07-04: 2 via ORAL
  Administered 2012-07-05: 1 via ORAL
  Filled 2012-07-02: qty 2
  Filled 2012-07-02 (×2): qty 1

## 2012-07-02 MED ORDER — LEVOFLOXACIN IN D5W 750 MG/150ML IV SOLN: 750.0000 mg | Freq: Once | INTRAVENOUS | Status: DC

## 2012-07-02 MED ORDER — DOCUSATE SODIUM 100 MG PO CAPS
100.0000 mg | ORAL_CAPSULE | Freq: Two times a day (BID) | ORAL | Status: DC
  Administered 2012-07-02 – 2012-07-04 (×5): 100 mg via ORAL
  Filled 2012-07-02 (×6): qty 1

## 2012-07-02 MED ORDER — VANCOMYCIN HCL 1000 MG IV SOLR
750.0000 mg | INTRAVENOUS | Status: DC
  Filled 2012-07-02: qty 750

## 2012-07-02 NOTE — Progress Notes (Signed)
Subjective:  Alert, cos constipation, no pain in arm now Objective Vital signs in last 24 hours: Filed Vitals:   07/02/12 0013 07/02/12 0527 07/02/12 0610 07/02/12 0930  BP: 118/66 95/64  124/38  Pulse: 89 84  83  Temp: 98.4 F (36.9 C) 98.4 F (36.9 C)  97.6 F (36.4 C)  TempSrc: Oral Oral  Oral  Resp: 18 18  18   Height:   5\' 3"  (1.6 m)   Weight:  69.8 kg (153 lb 14.1 oz)    SpO2: 99% 100%  95%   Weight change:   Intake/Output Summary (Last 24 hours) at 07/02/12 1302 Last data filed at 07/02/12 0900  Gross per 24 hour  Intake    240 ml  Output      0 ml  Net    240 ml   Labs: Basic Metabolic Panel:  Lab 07/02/12 1191 07/01/12 0550 06/30/12 0705 06/28/12 1949 06/28/12 0830  NA 133* 136 136 -- --  K 3.9 3.7 3.8 -- --  CL 91* 97 95* -- --  CO2 28 30 27  -- --  GLUCOSE 188* 158* 188* -- --  BUN 25* 16 30* -- --  CREATININE 7.47* 5.66* 7.92* -- --  CALCIUM 9.0 8.6 8.6 -- --  ALB -- -- -- -- --  PHOS -- -- 3.1 5.0* 3.6   Liver Function Tests:  Lab 06/30/12 0705 06/28/12 1949 06/28/12 0830  AST -- -- --  ALT -- -- --  ALKPHOS -- -- --  BILITOT -- -- --  PROT -- -- --  ALBUMIN 3.0* 2.8* 3.0*   No results found for this basename: LIPASE:3,AMYLASE:3 in the last 168 hours No results found for this basename: AMMONIA:3 in the last 168 hours CBC:  Lab 07/02/12 0022 07/01/12 0550 06/30/12 0705 06/28/12 1949 06/28/12 0830 06/25/12 2318 06/25/12 2140  WBC 11.6* 8.3 8.3 -- -- -- --  NEUTROABS -- -- -- -- -- 12.2* 9.3*  HGB 10.1* 9.6* 9.4* -- -- -- --  HCT 30.8* 29.8* 29.1* -- -- -- --  MCV 98.1 99.3 98.0 98.0 97.3 -- --  PLT 153 134* 127* -- -- -- --   Cardiac Enzymes: No results found for this basename: CKTOTAL:5,CKMB:5,CKMBINDEX:5,TROPONINI:5 in the last 168 hours CBG:  Lab 07/02/12 0737 07/01/12 1704 07/01/12 1125 07/01/12 0806 06/30/12 2038  GLUCAP 159* 170* 218* 246* 170*    Iron Studies: No results found for this basename: IRON,TIBC,TRANSFERRIN,FERRITIN in  the last 72 hours Studies/Results: Dg Chest 2 View  07/02/2012  *RADIOLOGY REPORT*  Clinical Data: History of recent rib fractures and pneumothorax  CHEST - 2 VIEW  Comparison: 06/29/2012; 06/28/2012; 06/25/2012; 09/28/2011 chest CT - 06/25/2012  Findings:  Grossly unchanged cardiac silhouette and mediastinal contours with obscuration of the left heart border secondary to a minimally increased left-sided pleural effusion and left basilar heterogeneous / consolidative opacities.  Minimal right basilar opacities.  No definite right-sided pleural effusion.  No definite pneumothorax.  Redemonstrated multiple mildly displaced posterior and lateral left- sided rib fractures.  IMPRESSION: Interval increase in size of left sided pleural effusion and left basilar heterogeneous / consolidative opacities, possibly atelectasis though underlying infection is not excluded. A follow- up chest radiograph in 4 to 6 weeks after treatment is recommended to ensure resolution.   Original Report Authenticated By: Tacey Ruiz, MD    Medications:      . acetaminophen  500 mg Oral Q8H  . amLODipine  2.5 mg Oral Daily  . aspirin  81  mg Oral Daily  . atorvastatin  80 mg Oral Daily  . ceFEPime (MAXIPIME) IV  2 g Intravenous Once  . ceFEPime (MAXIPIME) IV  2 g Intravenous Q T,Th,Sat-1800  . docusate sodium  100 mg Oral BID  . famotidine  20 mg Oral Daily  . insulin aspart  0-5 Units Subcutaneous QHS  . insulin aspart  0-9 Units Subcutaneous TID WC  . levofloxacin (LEVAQUIN) IV  500 mg Intravenous Once  . [COMPLETED] levofloxacin (LEVAQUIN) IV  750 mg Intravenous Once  . metoprolol tartrate  12.5 mg Oral BID  . sodium chloride  3 mL Intravenous Q12H  . vancomycin  1,500 mg Intravenous Once  . vancomycin  750 mg Intravenous Q T,Th,Sa-HD  . [DISCONTINUED] ceFEPime (MAXIPIME) IV  1 g Intravenous Q8H  . [DISCONTINUED] levofloxacin (LEVAQUIN) IV  750 mg Intravenous Once  . [DISCONTINUED] levofloxacin (LEVAQUIN) IV  750 mg  Intravenous Q24H  . [DISCONTINUED] vancomycin  1,500 mg Intravenous Once  . [DISCONTINUED] vancomycin  750 mg Intravenous Q T,Th,Sa-HD   I  have reviewed scheduled and prn medications.  Physical Exam: General:Alert, NAD Elderly AAF, appropriate Heart: RRR, no rub or murmur Lungs: Slightly decreased at bases/ otherwise CTA Abdomen: soft, nontender Extremities: Dialysis Access: no pedal edema, positive bruit L U A AVGG   Dialysis Orders: Center: [facility] on TTS.  EDW 68.5 kg HD Bath 2K/2.25Ca Time 3 hrs 45 mins Heparin 9000 U. Access AVG @ LUA BFR 400 DFR A1.5  Zemplar 4 mcg IV/HD Epogen 2600 Units IV/HD Venofer 100 mg on Thurs  Assessment/Recs  1. Multiple rib fractures/small PTX- readmitted with LLL PNA= ON Cefepime and Vancomycin/ Pain improved and. PTX resolved. Stopped narcotics, start colace and give Miralax  And prn sorbital 2. Clotted access L U A  AVGG- s/p thrombectomy and revision  on 11/5;  With Hematoma= Dr. Edilia Bo eval okay to use in hd, minimal to no heparin for a week of so if possible 3. ESRD - HD on TTS @ [facility]; K 3.9 today.  4. Hypertension/volume - norvasc/metoprolol. 1.3 kg over EDW. Stable. To low bp , dc 2.5mg  Norvasc 5. Anemia -HGB=10.1 on outpatient Epogen 2600 U and weekly iron IV. 6. Metabolic bone disease - Ca 9.0  last iPTH 233 on 10/17; on Zemplar 4 mcg,continue Phoslo 2 ac 7. Nutrition - high protein renal diet/ carb mod 8. DM Type 2 - on Januvia and Glucotrol. 9. Hx CVA - TIA with AMS in 10/2010.  Lenny Pastel, PA-C [facility] Beeper [phone] 07/02/2012,1:02 PM   LOS: 0 days   Patient seen and examined and agree with assessment and plan as above.  Vinson Moselle  MD [employer] [phone] pgr    [phone] cell 07/02/2012, 2:14 PM

## 2012-07-02 NOTE — Progress Notes (Signed)
VASCULAR PROGRESS NOTE  SUBJECTIVE: Asked to evaluate the patient because of swelling in left arm and bleeding from the incision. This is an 78 year old woman who had a left upper arm graft placed in April. In July she underwent angioplasty and stenting of the venous anastomosis. A long covered stent was placed across the venous anastomosis extending high into the axilla. She fell at home recently and broke several ribs. She was admitted and found to have a clotted left upper arm AV graft. I performed a thrombectomy and revision of her graft on 06/28/2012. She is apparently readmitted with pneumonia.I was asked to evaluate the left arm graft.  PHYSICAL EXAM: Filed Vitals:   07/02/12 0013 07/02/12 0527 07/02/12 0610  BP: 118/66 95/64   Pulse: 89 84   Temp: 98.4 F (36.9 C) 98.4 F (36.9 C)   TempSrc: Oral Oral   Resp: 18 18   Height:   5\' 3"  (1.6 m)  Weight:  153 lb 14.1 oz (69.8 kg)   SpO2: 99% 100%    The left upper arm graft has an excellent bruit and thrill. There is minimal arm swelling except in the axilla she does have some evidence of hematoma. There is some oozing from the incision but this appears to be old blood and I do not think that there is evidence of active bleeding. LABS: Lab Results  Component Value Date   WBC 11.6* 07/02/2012   HGB 10.1* 07/02/2012   HCT 30.8* 07/02/2012   MCV 98.1 07/02/2012   PLT 153 07/02/2012   Lab Results  Component Value Date   CREATININE 7.47* 07/02/2012   Lab Results  Component Value Date   INR 1.05 07/02/2012   CBG (last 3)   Basename 07/01/12 1704 07/01/12 1125 07/01/12 0806  GLUCAP 170* 218* 246*   ASSESSMENT/PLAN:  This patient has some hematoma in the left axillary incision. However, the skin is not compromised and I do not think that there is evidence of active bleeding. I have placed a dressing on the axillary wound and we will keep and I on this. The graft is functioning and should be usable for hemodialysis. I would however  minimize her heparin or not use heparin as possible. If she develops increased drainage from the axillary wound or worsening swelling then she would require exploration for evacuation of hematoma. However currently I do not think that this is necessary. I will follow the wound.  [name], MD, FACS Beeper: [phone] 07/02/2012

## 2012-07-02 NOTE — ED Notes (Signed)
Admitting doctor at the bedside 

## 2012-07-02 NOTE — ED Provider Notes (Signed)
History     CSN: [id number]  Arrival date & time 07/02/12  0000   First MD Initiated Contact with Patient 07/02/12 0023      Chief Complaint  Patient presents with  . graft site bleeding     (Consider location/radiation/quality/duration/timing/severity/associated sxs/prior treatment) HPI Comments: Claudia Rich presents from home for evaluation of bleeding of her left upper arm.  She reports she fell a few days ago and was evaluated in the emergency department.  She had some fractured ribs and was admitted to the hospital.  She developed a clot in her dialysis graft and had a procedure performed to  Remove the clot.  Today she states she has continued left-sided chest pain that is worsened by deep inspiration.  She states she feels as if she cannot get a good breath.  She has been using the incentive spirometer provided to her when she was discharged without much improvement in the symptoms.  She also developed swelling of the inner aspect of her left upper arm and axilla.  Yesterday it started oozing maroon colored blood.  She states that oozing and associated pain has continued.  She denies any fever, numbness in her left arm, or chills.  She states she has had no further trauma.  The history is provided by the patient. No language interpreter was used.    Past Medical History  Diagnosis Date  . Hypertension   . Diabetes mellitus   . Hyperlipidemia   . GERD (gastroesophageal reflux disease)   . Glaucoma   . Hammer toe   . Internal hemorrhoids   . Diverticulosis   . Colon polyps   . Peripheral vascular disease   . Anemia   . COPD (chronic obstructive pulmonary disease)   . Stroke 10/2010    Has some memory impairment per DTR  . Myocardial infarction 10/2010  . ESRD on hemodialysis     TTS at [facility]. Started HD     Past Surgical History  Procedure Date  . Av fistula repair 02/16/2011    Revision of left brachiocephalic arteriovenous fistula with patch angioplasty of left  brachiocephalic fistula  . Av fistula placement, brachiocephalic 09/24/2010    Left  . Av fistula placement   . Cataract extraction   . Abdominal hysterectomy   . Av fistula placement 08/14/2011    Procedure: ARTERIOVENOUS (AV) FISTULA CREATION;  Surgeon: Chuck Hint, MD;  Location: [facility] OR;  Service: Vascular;  Laterality: Right;  Right Arm Arteriovenous Fistula Creation/ contact pt's niece,[name] for any information to be relayed to pt: # [phone]  . Insertion of dialysis catheter 09/28/2011    Procedure: INSERTION OF DIALYSIS CATHETER;  Surgeon: Sherren Kerns, MD;  Location: [facility] OR;  Service: Vascular;  Laterality: Left;  Insertion of Dialysis Catheter left IJ  . Eye surgery   . Av fistula placement 12/07/2011    Procedure: INSERTION OF ARTERIOVENOUS (AV) GORE-TEX GRAFT ARM;  Surgeon: Sherren Kerns, MD;  Location: [facility] OR;  Service: Vascular;  Laterality: Left;  . Thrombectomy and revision of arterioventous (av) goretex  graft 06/28/2012    Procedure: THROMBECTOMY AND REVISION OF ARTERIOVENTOUS (AV) GORETEX  GRAFT;  Surgeon: Chuck Hint, MD;  Location: [facility] OR;  Service: Vascular;  Laterality: Left;    Family History  Problem Relation Age of Onset  . Anesthesia problems Neg Hx     History  Substance Use Topics  . Smoking status: Never Smoker   . Smokeless tobacco: Never Used  .  Alcohol Use: No    OB History    Grav Para Term Preterm Abortions TAB SAB Ect Mult Living                  Review of Systems  Constitutional: Positive for activity change and fatigue. Negative for fever, chills, diaphoresis and appetite change.  HENT: Negative for nosebleeds, congestion, sore throat, facial swelling, rhinorrhea and neck pain.   Eyes: Negative.   Respiratory: Positive for cough, chest tightness and shortness of breath.   Cardiovascular: Positive for chest pain (chest wall). Negative for palpitations and leg swelling.  Gastrointestinal: Negative for nausea,  vomiting, constipation and abdominal distention.  Genitourinary: Negative.   Musculoskeletal: Positive for arthralgias (left upper arm). Negative for myalgias, back pain and joint swelling.  Skin: Positive for wound. Negative for color change, pallor and rash.  Neurological: Negative for dizziness, tremors, syncope, weakness, light-headedness and headaches.  Hematological: Negative for adenopathy. Bruises/bleeds easily.  Psychiatric/Behavioral: Negative for confusion and dysphoric mood. The patient is not nervous/anxious.     Allergies  Aspirin; Codeine sulfate; and Meperidine hcl  Home Medications   Current Outpatient Rx  Name  Route  Sig  Dispense  Refill  . ACETAMINOPHEN 500 MG PO TABS   Oral   Take 1 tablet (500 mg total) by mouth every 8 (eight) hours. Take for 5 days then use as needed.   30 tablet   0   . ALBUTEROL SULFATE HFA 108 (90 BASE) MCG/ACT IN AERS   Inhalation   Inhale 2 puffs into the lungs every 6 (six) hours as needed. Shortness of breath         . AMLODIPINE BESYLATE 2.5 MG PO TABS   Oral   Take 1 tablet (2.5 mg total) by mouth daily.   31 tablet   0   . ASPIRIN 81 MG PO CHEW   Oral   Chew 81 mg by mouth daily.         . ATORVASTATIN CALCIUM 80 MG PO TABS   Oral   Take 1 tablet (80 mg total) by mouth daily.   30 tablet   6   . RENAL MULTIVITAMIN FORMULA PO   Oral   Take 1 tablet by mouth daily.          Marland Kitchen CALCIUM ACETATE 667 MG PO CAPS   Oral   Take 667 mg by mouth 2 (two) times daily.           . DSS 100 MG PO CAPS   Oral   Take 100 mg by mouth 2 (two) times daily.   10 capsule      . GLIPIZIDE 10 MG PO TABS   Oral   Take 2 tablets (20 mg total) by mouth 2 (two) times daily before a meal.   120 tablet   6   . HYDROCODONE-ACETAMINOPHEN 5-325 MG PO TABS   Oral   Take 1-2 tablets by mouth every 4 (four) hours as needed for pain.   20 tablet   0   . METOPROLOL TARTRATE 12.5 MG HALF TABLET   Oral   Take 0.5 tablets (12.5 mg  total) by mouth 2 (two) times daily.   62 tablet   0   . ONDANSETRON HCL 4 MG PO TABS   Oral   Take 4 mg by mouth every 8 (eight) hours as needed. For nausea.         Marland Kitchen RANITIDINE HCL 150 MG PO TABS   Oral  Take 1 tablet (150 mg total) by mouth 2 (two) times daily.   60 tablet   6   . SITAGLIPTIN PHOSPHATE 25 MG PO TABS   Oral   Take 1 tablet (25 mg total) by mouth daily.   30 tablet   6     BP 118/66  Pulse 89  Temp 98.4 F (36.9 C) (Oral)  Resp 18  SpO2 99%  Physical Exam  Nursing note and vitals reviewed. Constitutional: She is oriented to person, place, and time. She appears well-developed and well-nourished. She is not intubated.  HENT:  Head: Normocephalic and atraumatic.  Right Ear: External ear normal.  Left Ear: External ear normal.  Nose: Nose normal.  Mouth/Throat: Oropharynx is clear and moist. No oropharyngeal exudate.  Eyes: Conjunctivae normal are normal. Pupils are equal, round, and reactive to light. Right eye exhibits no discharge. Left eye exhibits no discharge. No scleral icterus.  Neck: Normal range of motion. Neck supple. No JVD present. No tracheal deviation present.  Cardiovascular: Normal rate, regular rhythm, intact distal pulses and normal pulses.  PMI is not displaced.  Exam reveals no gallop, no distant heart sounds, no friction rub and no decreased pulses.   Murmur heard. Pulmonary/Chest: Effort normal. No accessory muscle usage or stridor. No apnea, not tachypneic and not bradypneic. She is not intubated. No respiratory distress. She has decreased breath sounds in the left upper field and the left middle field. She has no wheezes. She has no rhonchi. She has no rales. She exhibits tenderness and bony tenderness. She exhibits no crepitus and no retraction.         Note splinting with each inhalation  Abdominal: Soft. Bowel sounds are normal. She exhibits no distension and no mass. There is no tenderness. There is no rebound and no  guarding.  Musculoskeletal: Normal range of motion. She exhibits tenderness (left upper arm). She exhibits no edema.       Arms: Lymphadenopathy:    She has no cervical adenopathy.  Neurological: She is alert and oriented to person, place, and time.  Skin: Skin is warm and dry. She is not diaphoretic.  Psychiatric: She has a normal mood and affect. Her behavior is normal.    ED Course  Procedures (including critical care time)  Labs Reviewed - No data to display No results found.   No diagnosis found.    MDM  Pt presents for evaluation of bleeding from the site of her recently revised left arm HD graft.  She was admitted to the hospital within the last week for evaluation, pain mgmnt, and treatment of left-sided rib fractures and a small pneumothorax.  She reports continued pain and discomfort.  0300.  Pt stable, NAD.  She has an increase in the size of the left-sided effusion and opacities concerning for atelectasis vs pneumonia.  She has splinting on exam.  This is likely atelectasis however given her age and chronic conditions, will treat for pneumonia.  She also appears to have an upper arm hematoma.  The appropriate study to better assess the patency of the graft is performed by the vascular group.  Secondary to the degree of her discomfort and the worsening pulmonary status, plan admit for further mgmnt.  Discussed with the on-call hospitalist.        Tobin Chad, MD 07/02/12 [phone]

## 2012-07-02 NOTE — ED Notes (Signed)
Iv team called to start iv 

## 2012-07-02 NOTE — ED Notes (Signed)
The pt arrived by gems from home.  Dialysis pt that was dialyzed earlier today.  Incision lt upper arm oozing dark blood for 2 days.  She also fell 3 days ago and has some cracked ribs

## 2012-07-02 NOTE — ED Notes (Signed)
The pt continues to cough .  Her pain is a direct result of the cough.

## 2012-07-02 NOTE — ED Notes (Signed)
The pt has a cough and she has very much pain whenever she coughs.  Her lungs are very congested

## 2012-07-02 NOTE — ED Notes (Signed)
The pts levaquin has almost infused

## 2012-07-02 NOTE — Progress Notes (Signed)
Pt admitted from home. Pt with Contact Isolation on door. Last MRSA swab on 11/03-negative. Pt.with left arm AVG  incision (not wound).Minimal drainage (old blood) per MD note. Consulted with Seabron Spates in Infection Prevention. Contact Isolation d/c'd.

## 2012-07-02 NOTE — H&P (Signed)
PCP:    Lillia Abed, MD  Neph[facility]  Chief Complaint:   Arm bleeding  HPI: Claudia Rich is a 78 y.o. female   has a past medical history of Hypertension; Diabetes mellitus; Hyperlipidemia; GERD (gastroesophageal reflux disease); Glaucoma; Hammer toe; Internal hemorrhoids; Diverticulosis; Colon polyps; Peripheral vascular disease; Anemia; COPD (chronic obstructive pulmonary disease); Stroke (10/2010); Myocardial infarction (10/2010); and ESRD on hemodialysis.   Presented with  She was just discharged [facility] yesterday afternoon after an admission for the fall. During that admit she had clotted off and she had to have thrombectomy. She returned to ER because her arm swelled up and the site began to bleed.  She gets dialyses Tuesday, Thursday and Saturday and is due for this AM. Have spoken to HD unit but will also notify Renal MD in AM.  She has been having chest pain only with cough. She states her cough have been getting worse. No fever or chills. Family states overall she have not done well since her discharge and has trouble with pain and taking good breaths.   Review of Systems:     Pertinent positives include: chest pain, productive cough,  Constitutional:  No weight loss, night sweats, Fevers, chills, fatigue, weight loss  HEENT:  No headaches, Difficulty swallowing,Tooth/dental problems,Sore throat,  No sneezing, itching, ear ache, nasal congestion, post nasal drip,  Cardio-vascular:  No Orthopnea, PND, anasarca, dizziness, palpitations.no Bilateral lower extremity swelling  GI:  No heartburn, indigestion, abdominal pain, nausea, vomiting, diarrhea, change in bowel habits, loss of appetite, melena, blood in stool, hematemesis Resp:  no shortness of breath at rest. No dyspnea on exertion, No excess mucus, no  No non-productive cough, No coughing up of blood.No change in color of mucus.No wheezing. Skin:  no rash or lesions. No jaundice GU:  no dysuria,  change in color of urine, no urgency or frequency. No straining to urinate.  No flank pain.  Musculoskeletal:  No joint pain or no joint swelling. No decreased range of motion. No back pain.  Psych:  No change in mood or affect. No depression or anxiety. No memory loss.  Neuro: no localizing neurological complaints, no tingling, no weakness, no double vision, no gait abnormality, no slurred speech, no confusion  Otherwise ROS are negative except for above, 10 systems were reviewed  Past Medical History: Past Medical History  Diagnosis Date  . Hypertension   . Diabetes mellitus   . Hyperlipidemia   . GERD (gastroesophageal reflux disease)   . Glaucoma   . Hammer toe   . Internal hemorrhoids   . Diverticulosis   . Colon polyps   . Peripheral vascular disease   . Anemia   . COPD (chronic obstructive pulmonary disease)   . Stroke 10/2010    Has some memory impairment per DTR  . Myocardial infarction 10/2010  . ESRD on hemodialysis     TTS at [facility]. Started HD    Past Surgical History  Procedure Date  . Av fistula repair 02/16/2011    Revision of left brachiocephalic arteriovenous fistula with patch angioplasty of left brachiocephalic fistula  . Av fistula placement, brachiocephalic 09/24/2010    Left  . Av fistula placement   . Cataract extraction   . Abdominal hysterectomy   . Av fistula placement 08/14/2011    Procedure: ARTERIOVENOUS (AV) FISTULA CREATION;  Surgeon: Chuck Hint, MD;  Location: [facility] OR;  Service: Vascular;  Laterality: Right;  Right Arm Arteriovenous Fistula Creation/ contact pt's niece,[name]  for any information to be relayed to pt: # [phone]  . Insertion of dialysis catheter 09/28/2011    Procedure: INSERTION OF DIALYSIS CATHETER;  Surgeon: Sherren Kerns, MD;  Location: [facility] OR;  Service: Vascular;  Laterality: Left;  Insertion of Dialysis Catheter left IJ  . Eye surgery   . Av fistula placement 12/07/2011    Procedure: INSERTION OF  ARTERIOVENOUS (AV) GORE-TEX GRAFT ARM;  Surgeon: Sherren Kerns, MD;  Location: [facility] OR;  Service: Vascular;  Laterality: Left;  . Thrombectomy and revision of arterioventous (av) goretex  graft 06/28/2012    Procedure: THROMBECTOMY AND REVISION OF ARTERIOVENTOUS (AV) GORETEX  GRAFT;  Surgeon: Chuck Hint, MD;  Location: [facility] OR;  Service: Vascular;  Laterality: Left;     Medications: Prior to Admission medications   Medication Sig Start Date End Date Taking? Authorizing Provider  acetaminophen (TYLENOL) 500 MG tablet Take 1 tablet (500 mg total) by mouth every 8 (eight) hours. Take for 5 days then use as needed. 07/01/12  Yes Rodolph Bong, MD  albuterol (PROVENTIL HFA;VENTOLIN HFA) 108 (90 BASE) MCG/ACT inhaler Inhale 2 puffs into the lungs every 6 (six) hours as needed. Shortness of breath   Yes Historical Provider, MD  amLODipine (NORVASC) 2.5 MG tablet Take 1 tablet (2.5 mg total) by mouth daily. 07/01/12  Yes Rodolph Bong, MD  aspirin 81 MG chewable tablet Chew 81 mg by mouth daily.   Yes Historical Provider, MD  atorvastatin (LIPITOR) 80 MG tablet Take 1 tablet (80 mg total) by mouth daily. 05/10/12  Yes Dayarmys Piloto de Criselda Peaches, MD  B Complex-C-Folic Acid (RENAL MULTIVITAMIN FORMULA PO) Take 1 tablet by mouth daily.    Yes Historical Provider, MD  calcium acetate (PHOSLO) 667 MG capsule Take 667 mg by mouth 2 (two) times daily.     Yes Historical Provider, MD  docusate sodium 100 MG CAPS Take 100 mg by mouth 2 (two) times daily. 07/01/12  Yes Rodolph Bong, MD  glipiZIDE (GLUCOTROL) 10 MG tablet Take 2 tablets (20 mg total) by mouth 2 (two) times daily before a meal. 05/10/12  Yes Dayarmys Pil[place] de Criselda Peaches, MD  HYDROcodone-acetaminophen (NORCO/VICODIN) 5-325 MG per tablet Take 1-2 tablets by mouth every 4 (four) hours as needed for pain. 07/01/12  Yes Rodolph Bong, MD  metoprolol tartrate (LOPRESSOR) 12.5 mg TABS Take 0.5 tablets (12.5 mg total) by mouth 2 (two) times  daily. 07/01/12  Yes Rodolph Bong, MD  ondansetron (ZOFRAN) 4 MG tablet Take 4 mg by mouth every 8 (eight) hours as needed. For nausea.   Yes Historical Provider, MD  ranitidine (ZANTAC) 150 MG tablet Take 1 tablet (150 mg total) by mouth 2 (two) times daily. 05/10/12  Yes Dayarmys [place] de Criselda Peaches, MD  sitaGLIPtin (JANUVIA) 25 MG tablet Take 1 tablet (25 mg total) by mouth daily. 05/10/12  Yes Dayarmys Pil[place] de Criselda Peaches, MD    Allergies:   Allergies  Allergen Reactions  . Aspirin     Upset stomach  . Codeine Sulfate Other (See Comments)    unknown  . Meperidine Hcl     REACTION: vomiting    Social History:  Ambulatory   walker cane Lives at   Home alone   reports that she has never smoked. She has never used smokeless tobacco. She reports that she does not drink alcohol or use illicit drugs.   Family History: family history includes Hypertension in her sister.  There is  no history of Anesthesia problems.    Physical Exam: Patient Vitals for the past 24 hrs:  BP Temp Temp src Pulse Resp SpO2  07/02/12 0013 118/66 mmHg 98.4 F (36.9 C) Oral 89  18  99 %    1. General:  in No Acute distress 2. Psychological: Alert and  Oriented 3. Head/ENT:   Moist   Mucous Membranes                          Head Non traumatic, neck supple                           Poor Dentition 4. SKIN: decreased Skin turgor,  Skin clean Dry and intact no rash 5. Heart: Regular rate and rhythm no Murmur, Rub or gallop 6. Lungs:  no wheezes ocassional crackles diminished air movement on the left  7. Abdomen: Soft, non-tender, Non distended 8. Lower extremities: no clubbing, cyanosis, or edema, Left arm with large hematoma at the site of the graft 9. Neurologically Grossly intact, moving all 4 extremities equally 10. MSK: Normal range of motion  body mass index is unknown because there is no height or weight on file.   Labs on Admission:   [facility] 07/02/12 0022 07/01/12 0550 06/30/12 0705  NA  133* 136 --  K 3.9 3.7 --  CL 91* 97 --  CO2 28 30 --  GLUCOSE 188* 158* --  BUN 25* 16 --  CREATININE 7.47* 5.66* --  CALCIUM 9.0 8.6 --  MG -- -- --  PHOS -- -- 3.1    Basename 06/30/12 0705  AST --  ALT --  ALKPHOS --  BILITOT --  PROT --  ALBUMIN 3.0*   No results found for this basename: LIPASE:2,AMYLASE:2 in the last 72 hours  Basename 07/02/12 0022 07/01/12 0550  WBC 11.6* 8.3  NEUTROABS -- --  HGB 10.1* 9.6*  HCT 30.8* 29.8*  MCV 98.1 99.3  PLT 153 134*   No results found for this basename: CKTOTAL:3,CKMB:3,CKMBINDEX:3,TROPONINI:3 in the last 72 hours No results found for this basename: TSH,T4TOTAL,FREET3,T3FREE,THYROIDAB in the last 72 hours No results found for this basename: VITAMINB12:2,FOLATE:2,FERRITIN:2,TIBC:2,IRON:2,RETICCTPCT:2 in the last 72 hours Lab Results  Component Value Date   HGBA1C 7.0* 06/27/2012    The CrCl is unknown because both a height and weight (above a minimum accepted value) are required for this calculation. ABG    Component Value Date/Time   TCO2 33 10/09/2011 1018     No results found for this basename: DDIMER     Other results:   Cultures:    Component Value Date/Time   SDES URINE, CATHETERIZED 01/07/2011 2057   SPECREQUEST NONE 01/07/2011 2057   CULT NO GROWTH 01/07/2011 2057   REPTSTATUS 01/08/2011 FINAL 01/07/2011 2057       Radiological Exams on Admission: Dg Chest 2 View  07/02/2012  *RADIOLOGY REPORT*  Clinical Data: History of recent rib fractures and pneumothorax  CHEST - 2 VIEW  Comparison: 06/29/2012; 06/28/2012; 06/25/2012; 09/28/2011 chest CT - 06/25/2012  Findings:  Grossly unchanged cardiac silhouette and mediastinal contours with obscuration of the left heart border secondary to a minimally increased left-sided pleural effusion and left basilar heterogeneous / consolidative opacities.  Minimal right basilar opacities.  No definite right-sided pleural effusion.  No definite pneumothorax.  Redemonstrated  multiple mildly displaced posterior and lateral left- sided rib fractures.  IMPRESSION: Interval increase in size of left  sided pleural effusion and left basilar heterogeneous / consolidative opacities, possibly atelectasis though underlying infection is not excluded. A follow- up chest radiograph in 4 to 6 weeks after treatment is recommended to ensure resolution.   Original Report Authenticated By: Tacey Ruiz, MD     Chart has been reviewed  Assessment/Plan  78 yo F who was just discharged recently after an admission for pneumothorax after a fall and 3 rib fracture. Of note patient had a graft thrombosis and had a thrombectomy done on the fifth. She had had some swelling since her discharge in the left arm as well as worsening bleeding at the site. Due to severe swelling unable to palpate a bruit. Chest x-ray worrisome for worsening infiltrate which would be consistent with hospital acquired pneumonia.  Present on Admission:  . Hospital-acquired pneumonia - patient already finished a course of Levaquin and while she was in the hospital we'll add cefepime and vancomycin.  . End stage renal disease - in a.m. will contact renal to let him know the patient is here  . Pneumothorax, traumatic - per chest x-ray the pneumothorax has improved  . DIABETES MELLITUS, TYPE II, UNCONTROLLED - sliding-scale insulin  . Arm edema - the evaluate access with ultrasound in a.m. IN AM please call vascular surgery to see patient. Dr. [name] have seen her during admssion for the graft revision.     Prophylaxis: SCD Protonix  CODE STATUS: FULL CODE for now but patient is unsure  Other plan as per orders.  I have spent a total of 55 min on this admission  Leonila Speranza 07/02/2012, 3:58 AM

## 2012-07-02 NOTE — ED Notes (Signed)
Dressing to the lt axilla incision replaced.  Minimal bright red blood on the bandage

## 2012-07-02 NOTE — ED Notes (Signed)
According to the family the pt was last dialyzed Thursday.  She was an in patient until 18 today when she was discharged from this hospital.  She was bleeding while in the hospital but she does not think that the doctor checked it.  There was no bandage until ems placed it.  On Tuesday she had a clot removed from her graft.  Pt alert no pain in the site

## 2012-07-02 NOTE — ED Notes (Signed)
Report called to [phone] 

## 2012-07-02 NOTE — Progress Notes (Signed)
ANTIBIOTIC CONSULT NOTE - INITIAL  Pharmacy Consult for Vancocin/Levaquin/Maxipime Indication: rule out pneumonia  Allergies  Allergen Reactions  . Aspirin     Upset stomach (pt takes low dose at home)  . Codeine Sulfate Other (See Comments)    unknown  . Meperidine Hcl     REACTION: vomiting    Patient Measurements: Height: 5\' 3"  (160 cm) Weight: 153 lb 14.1 oz (69.8 kg) IBW/kg (Calculated) : 52.4   Vital Signs: Temp: 98.4 F (36.9 C) (11/09 0527) Temp src: Oral (11/09 0527) BP: 95/64 mmHg (11/09 0527) Pulse Rate: 84  (11/09 0527)  Labs:  Basename 07/02/12 0022 07/01/12 0550 06/30/12 0705  WBC 11.6* 8.3 8.3  HGB 10.1* 9.6* 9.4*  PLT 153 134* 127*  LABCREA -- -- --  CREATININE 7.47* 5.66* 7.92*   Estimated Creatinine Clearance: 5.4 ml/min (by C-G formula based on Cr of 7.47).  Microbiology: Recent Results (from the past 720 hour(s))  MRSA PCR SCREENING     Status: Normal   Collection Time   06/26/12  2:30 AM      Component Value Range Status Comment   MRSA by PCR NEGATIVE  NEGATIVE Final     Medical History: Past Medical History  Diagnosis Date  . Hypertension   . Diabetes mellitus   . Hyperlipidemia   . GERD (gastroesophageal reflux disease)   . Glaucoma   . Hammer toe   . Internal hemorrhoids   . Diverticulosis   . Colon polyps   . Peripheral vascular disease   . Anemia   . COPD (chronic obstructive pulmonary disease)   . Stroke 10/2010    Has some memory impairment per DTR  . Myocardial infarction 10/2010  . ESRD on hemodialysis     TTS at [facility]. Started HD     Medications:  Prescriptions prior to admission  Medication Sig Dispense Refill  . acetaminophen (TYLENOL) 500 MG tablet Take 1 tablet (500 mg total) by mouth every 8 (eight) hours. Take for 5 days then use as needed.  30 tablet  0  . albuterol (PROVENTIL HFA;VENTOLIN HFA) 108 (90 BASE) MCG/ACT inhaler Inhale 2 puffs into the lungs every 6 (six) hours as needed. Shortness of breath       . amLODipine (NORVASC) 2.5 MG tablet Take 1 tablet (2.5 mg total) by mouth daily.  31 tablet  0  . aspirin 81 MG chewable tablet Chew 81 mg by mouth daily.      Marland Kitchen atorvastatin (LIPITOR) 80 MG tablet Take 1 tablet (80 mg total) by mouth daily.  30 tablet  6  . B Complex-C-Folic Acid (RENAL MULTIVITAMIN FORMULA PO) Take 1 tablet by mouth daily.       . calcium acetate (PHOSLO) 667 MG capsule Take 667 mg by mouth 2 (two) times daily.        Marland Kitchen docusate sodium 100 MG CAPS Take 100 mg by mouth 2 (two) times daily.  10 capsule    . glipiZIDE (GLUCOTROL) 10 MG tablet Take 2 tablets (20 mg total) by mouth 2 (two) times daily before a meal.  120 tablet  6  . HYDROcodone-acetaminophen (NORCO/VICODIN) 5-325 MG per tablet Take 1-2 tablets by mouth every 4 (four) hours as needed for pain.  20 tablet  0  . metoprolol tartrate (LOPRESSOR) 12.5 mg TABS Take 0.5 tablets (12.5 mg total) by mouth 2 (two) times daily.  62 tablet  0  . ondansetron (ZOFRAN) 4 MG tablet Take 4 mg by mouth every 8 (  eight) hours as needed. For nausea.      . ranitidine (ZANTAC) 150 MG tablet Take 1 tablet (150 mg total) by mouth 2 (two) times daily.  60 tablet  6  . sitaGLIPtin (JANUVIA) 25 MG tablet Take 1 tablet (25 mg total) by mouth daily.  30 tablet  6   Scheduled:    . acetaminophen  500 mg Oral Q8H  . amLODipine  2.5 mg Oral Daily  . aspirin  81 mg Oral Daily  . atorvastatin  80 mg Oral Daily  . ceFEPime (MAXIPIME) IV  2 g Intravenous Once  . ceFEPime (MAXIPIME) IV  2 g Intravenous Q T,Th,Sat-1800  . docusate sodium  100 mg Oral BID  . famotidine  20 mg Oral Daily  . insulin aspart  0-5 Units Subcutaneous QHS  . insulin aspart  0-9 Units Subcutaneous TID WC  . levofloxacin (LEVAQUIN) IV  500 mg Intravenous Once  . [COMPLETED] levofloxacin (LEVAQUIN) IV  750 mg Intravenous Once  . metoprolol tartrate  12.5 mg Oral BID  . sodium chloride  3 mL Intravenous Q12H  . vancomycin  1,500 mg Intravenous Once  . vancomycin  750  mg Intravenous Q T,Th,Sa-HD  . [DISCONTINUED] ceFEPime (MAXIPIME) IV  1 g Intravenous Q8H  . [DISCONTINUED] levofloxacin (LEVAQUIN) IV  750 mg Intravenous Once  . [DISCONTINUED] levofloxacin (LEVAQUIN) IV  750 mg Intravenous Q24H    Assessment: 78yo female was recently tx'd for PNA, discharged from hospital yesterday, CXR concerning for worsening infiltrate, to begin another round of ABX.  Goal of Therapy:  Pre-HD vanc level 15-25  Plan:  Will give vancomycin 1500mg  IV x1 then begin 750mg  IV after each HF; will also change Levaquin to 500mg  IV in 48hr to complete three days of coverage and change cefepime to 2g IV on HD days; monitor CBC, Cx, levels prn.  Colleen Can PharmD BCPS 07/02/2012,6:24 AM

## 2012-07-02 NOTE — Progress Notes (Signed)
[facility]             Progress Note   Subjective: Doing ok, wound seen and dressed by Dr.Dickson, no further bleeding  Objective: Vital signs in last 24 hours: Temp:  [97.6 F (36.4 C)-98.6 F (37 C)] 97.6 F (36.4 C) (11/09 0930) Pulse Rate:  [77-89] 83  (11/09 0930) Resp:  [18] 18  (11/09 0930) BP: (95-124)/(38-66) 124/38 mmHg (11/09 0930) SpO2:  [95 %-100 %] 95 % (11/09 0930) Weight:  [69.8 kg (153 lb 14.1 oz)] 69.8 kg (153 lb 14.1 oz) (11/09 0527) Weight change:     Intake/Output from previous day:   Total I/O In: 240 [P.O.:240] Out: -    Physical Exam: General: Alert, awake, oriented x to self and partly to place,  in no acute distress. HEENT: No bruits, no goiter. Heart: Regular rate and rhythm, without murmurs, rubs, gallops. Lungs: ronchi at L base Abdomen: Soft, nontender, nondistended, positive bowel sounds. Extremities: No clubbing cyanosis or edema with positive pedal pulses. Neuro: Grossly intact, nonfocal.    Lab Results: Basic Metabolic Panel:  Basename 07/02/12 0022 07/01/12 0550 06/30/12 0705  NA 133* 136 --  K 3.9 3.7 --  CL 91* 97 --  CO2 28 30 --  GLUCOSE 188* 158* --  BUN 25* 16 --  CREATININE 7.47* 5.66* --  CALCIUM 9.0 8.6 --  MG -- -- --  PHOS -- -- 3.1   Liver Function Tests:  Basename 06/30/12 0705  AST --  ALT --  ALKPHOS --  BILITOT --  PROT --  ALBUMIN 3.0*   No results found for this basename: LIPASE:2,AMYLASE:2 in the last 72 hours No results found for this basename: AMMONIA:2 in the last 72 hours CBC:  Basename 07/02/12 0022 07/01/12 0550  WBC 11.6* 8.3  NEUTROABS -- --  HGB 10.1* 9.6*  HCT 30.8* 29.8*  MCV 98.1 99.3  PLT 153 134*   Cardiac Enzymes: No results found for this basename: CKTOTAL:3,CKMB:3,CKMBINDEX:3,TROPONINI:3 in the last 72 hours BNP: No results found for this basename: PROBNP:3 in the last 72 hours D-Dimer: No results found for this basename: DDIMER:2 in the last 72  hours CBG:  Basename 07/02/12 0737 07/01/12 1704 07/01/12 1125 07/01/12 0806 06/30/12 2038 06/30/12 1631  GLUCAP 159* 170* 218* 246* 170* 218*   Hemoglobin A1C: No results found for this basename: HGBA1C in the last 72 hours Fasting Lipid Panel: No results found for this basename: CHOL,HDL,LDLCALC,TRIG,CHOLHDL,LDLDIRECT in the last 72 hours Thyroid Function Tests: No results found for this basename: TSH,T4TOTAL,FREET4,T3FREE,THYROIDAB in the last 72 hours Anemia Panel: No results found for this basename: VITAMINB12,FOLATE,FERRITIN,TIBC,IRON,RETICCTPCT in the last 72 hours Coagulation:  Basename 07/02/12 0022  LABPROT 13.6  INR 1.05   Urine Drug Screen: Drugs of Abuse     Component Value Date/Time   LABOPIA NONE DETECTED 11/04/2010 1514   COCAINSCRNUR NONE DETECTED 11/04/2010 1514   LABBENZ NONE DETECTED 11/04/2010 1514   AMPHETMU NONE DETECTED 11/04/2010 1514   THCU NONE DETECTED 11/04/2010 1514   LABBARB  Value: NONE DETECTED        DRUG SCREEN FOR MEDICAL PURPOSES ONLY.  IF CONFIRMATION IS NEEDED FOR ANY PURPOSE, NOTIFY LAB WITHIN 5 DAYS.        LOWEST DETECTABLE LIMITS FOR URINE DRUG SCREEN Drug Class       Cutoff (ng/mL) Amphetamine      1000 Barbiturate      200 Benzodiazepine   200 Tricyclics       300 Opiates  300 Cocaine          300 THC              50 11/04/2010 1514    Alcohol Level: No results found for this basename: ETH:2 in the last 72 hours Urinalysis: No results found for this basename: COLORURINE:2,APPERANCEUR:2,LABSPEC:2,PHURINE:2,GLUCOSEU:2,HGBUR:2,BILIRUBINUR:2,KETONESUR:2,PROTEINUR:2,UROBILINOGEN:2,NITRITE:2,LEUKOCYTESUR:2 in the last 72 hours  Recent Results (from the past 240 hour(s))  MRSA PCR SCREENING     Status: Normal   Collection Time   06/26/12  2:30 AM      Component Value Range Status Comment   MRSA by PCR NEGATIVE  NEGATIVE Final     Studies/Results: Dg Chest 2 View  07/02/2012  *RADIOLOGY REPORT*  Clinical Data: History of recent rib  fractures and pneumothorax  CHEST - 2 VIEW  Comparison: 06/29/2012; 06/28/2012; 06/25/2012; 09/28/2011 chest CT - 06/25/2012  Findings:  Grossly unchanged cardiac silhouette and mediastinal contours with obscuration of the left heart border secondary to a minimally increased left-sided pleural effusion and left basilar heterogeneous / consolidative opacities.  Minimal right basilar opacities.  No definite right-sided pleural effusion.  No definite pneumothorax.  Redemonstrated multiple mildly displaced posterior and lateral left- sided rib fractures.  IMPRESSION: Interval increase in size of left sided pleural effusion and left basilar heterogeneous / consolidative opacities, possibly atelectasis though underlying infection is not excluded. A follow- up chest radiograph in 4 to 6 weeks after treatment is recommended to ensure resolution.   Original Report Authenticated By: Tacey Ruiz, MD     Medications: Scheduled Meds:   . acetaminophen  500 mg Oral Q8H  . amLODipine  2.5 mg Oral Daily  . aspirin  81 mg Oral Daily  . atorvastatin  80 mg Oral Daily  . ceFEPime (MAXIPIME) IV  2 g Intravenous Once  . ceFEPime (MAXIPIME) IV  2 g Intravenous Q T,Th,Sat-1800  . docusate sodium  100 mg Oral BID  . famotidine  20 mg Oral Daily  . insulin aspart  0-5 Units Subcutaneous QHS  . insulin aspart  0-9 Units Subcutaneous TID WC  . levofloxacin (LEVAQUIN) IV  500 mg Intravenous Once  . [COMPLETED] levofloxacin (LEVAQUIN) IV  750 mg Intravenous Once  . metoprolol tartrate  12.5 mg Oral BID  . sodium chloride  3 mL Intravenous Q12H  . vancomycin  1,500 mg Intravenous Once  . vancomycin  750 mg Intravenous Q T,Th,Sa-HD  . [DISCONTINUED] ceFEPime (MAXIPIME) IV  1 g Intravenous Q8H  . [DISCONTINUED] levofloxacin (LEVAQUIN) IV  750 mg Intravenous Once  . [DISCONTINUED] levofloxacin (LEVAQUIN) IV  750 mg Intravenous Q24H   Continuous Infusions:  PRN Meds:.sodium chloride, albuterol, HYDROcodone-acetaminophen,  sodium chloride  Assessment/Plan:  Present on Admission:  . Hospital-acquired pneumonia - patient already finished a course of Levaquin and while she was in the hospital, continue cefepime and vancomycin.  . End stage renal disease - renal notified for HD today, avoid heparin at HD   . Pneumothorax, traumatic - per chest x-ray the pneumothorax has improved  . DIABETES MELLITUS, TYPE II, UNCONTROLLED - sliding-scale insulin , stable . L arm AVG with hematoma: s/p dressing by Dr.Dickson, graft can be used for HD per [facility], if worsens will need exploration and evacuation.  Dementia: d/W PoA Orlie Dakin [name] she's working on getting her to [facility] eventually  DVT proph: SCDs FULL CODE: Dispo: home with [facility] services    Time spent coordinating care:   LOS: 0 days   [facility] Pager: [phone] 07/02/2012, 10:15  AM

## 2012-07-03 DIAGNOSIS — N186 End stage renal disease: Secondary | ICD-10-CM

## 2012-07-03 DIAGNOSIS — S2249XA Multiple fractures of ribs, unspecified side, initial encounter for closed fracture: Secondary | ICD-10-CM

## 2012-07-03 DIAGNOSIS — F0390 Unspecified dementia without behavioral disturbance: Secondary | ICD-10-CM

## 2012-07-03 LAB — COMPREHENSIVE METABOLIC PANEL
ALT: 16 U/L (ref 0–35)
AST: 19 U/L (ref 0–37)
Alkaline Phosphatase: 115 U/L (ref 39–117)
CO2: 28 mEq/L (ref 19–32)
Calcium: 9 mg/dL (ref 8.4–10.5)
Potassium: 4.1 mEq/L (ref 3.5–5.1)
Sodium: 134 mEq/L — ABNORMAL LOW (ref 135–145)
Total Protein: 7.3 g/dL (ref 6.0–8.3)

## 2012-07-03 LAB — CBC
HCT: 29 % — ABNORMAL LOW (ref 36.0–46.0)
Hemoglobin: 9.4 g/dL — ABNORMAL LOW (ref 12.0–15.0)
MCH: 31.9 pg (ref 26.0–34.0)
MCHC: 32.4 g/dL (ref 30.0–36.0)
MCV: 98.3 fL (ref 78.0–100.0)
RDW: 14 % (ref 11.5–15.5)

## 2012-07-03 LAB — GLUCOSE, CAPILLARY
Glucose-Capillary: 242 mg/dL — ABNORMAL HIGH (ref 70–99)
Glucose-Capillary: 202 mg/dL — ABNORMAL HIGH (ref 70–99)

## 2012-07-03 MED ORDER — RENA-VITE PO TABS
1.0000 | ORAL_TABLET | Freq: Every day | ORAL | Status: DC
  Administered 2012-07-03 – 2012-07-05 (×3): 1 via ORAL
  Filled 2012-07-03 (×3): qty 1

## 2012-07-03 NOTE — Progress Notes (Signed)
[facility] Progress Note  Subjective: no c/o.  Objective Filed Vitals:   07/02/12 2253 07/02/12 2325 07/03/12 0552 07/03/12 0943  BP: 115/48 138/50 119/42 114/42  Pulse: 103 68 87 87  Temp: 98.1 F (36.7 C) 98.1 F (36.7 C) 97.8 F (36.6 C) 97.6 F (36.4 C)  TempSrc: Oral Oral Oral Oral  Resp: 24 22 20 20   Height:  5\' 3"  (1.6 m)    Weight: 66.7 kg (147 lb 0.8 oz) 66.7 kg (147 lb 0.8 oz)    SpO2: 92% 92% 100% 98%   Physical Exam General: NAD Heart:  RRR Lungs: decreased Left base  Abdomen: soft Extremities: no LE edema Dialysis Access: left upper AVGG patent; no bleeding  Dialysis Orders: Center: [facility] on TTS.  EDW 68.5 kg HD Bath 2K/2.25Ca Time 3 hrs 45 mins Heparin 9000 U. Access AVG @ LUA BFR 400 DFR A1.5  Zemplar 4 mcg IV/HD Epogen 2600 Units IV/HD Venofer 100 mg on Thurs  Assessment/Plan: 1. Multiple rib fractures/small PTX- readmitted with LLL PNA= ON Cefepime and Vancomycin/ Pain improved and PTX resolved. Stopped narcotics, start colace and give Miralax And prn sorbital 2. ESRD - TTS s/p thrombectomy and revision 11/05; ok to use wioth min to no heparin x 1 weeks 3. Anemia - Hgb 9.4; Aranesp 60 and weekly IV Fe 4. Secondary hyperparathyroidism - continue zemplar and phoslo 5. HTN/volume - below EDW BP ok; CXR 11/09 rec f/u l;eft pleural effusion 4-6 weeks after d/c; .UF 2.7 Saturday.  Looks like CXR was done before HD. Continue to challenge volume. 6. Nutrition - high protein renal diet 7. Type 2 DM - per primary Sheffield Slider, PA-C  [facility] Beeper [phone]  07/03/2012,11:14 AM  LOS: 1 day   Patient seen and examined and agree with assessment and plan as above.  Claudia Moselle  MD [facility] [phone] pgr    [phone] cell 07/03/2012, 1:04 PM   Additional Objective Labs: Basic Metabolic Panel:  Lab 07/03/12 9528 07/02/12 0022 07/01/12 0550 06/30/12 0705 06/28/12 1949 06/28/12 0830  NA 134* 133* 136  -- -- --  K 4.1 3.9 3.7 -- -- --  CL 93* 91* 97 -- -- --  CO2 28 28 30  -- -- --  GLUCOSE 165* 188* 158* -- -- --  BUN 20 25* 16 -- -- --  CREATININE 6.02* 7.47* 5.66* -- -- --  CALCIUM 9.0 9.0 8.6 -- -- --  ALB -- -- -- -- -- --  PHOS -- -- -- 3.1 5.0* 3.6   Liver Function Tests:  Lab 07/03/12 0550 06/30/12 0705 06/28/12 1949  AST 19 -- --  ALT 16 -- --  ALKPHOS 115 -- --  BILITOT 0.6 -- --  PROT 7.3 -- --  ALBUMIN 3.1* 3.0* 2.8*   CBC:  Lab 07/03/12 0550 07/02/12 2030 07/02/12 0022 07/01/12 0550 06/30/12 0705  WBC 9.5 10.0 11.6* -- --  NEUTROABS -- -- -- -- --  HGB 9.4* 9.3* 10.1* -- --  HCT 29.0* 27.6* 30.8* -- --  MCV 98.3 96.2 98.1 99.3 98.0  PLT 195 182 153 -- --  CBG:  Lab 07/02/12 2323 07/02/12 1650 07/02/12 1217 07/02/12 0737 07/01/12 1704  GLUCAP 119* 202* 242* 159* 170*  Studies/Results: Dg Chest 2 View  07/02/2012  *RADIOLOGY REPORT*  Clinical Data: History of recent rib fractures and pneumothorax  CHEST - 2 VIEW  Comparison: 06/29/2012; 06/28/2012; 06/25/2012; 09/28/2011 chest CT - 06/25/2012  Findings:  Grossly unchanged cardiac silhouette  and mediastinal contours with obscuration of the left heart border secondary to a minimally increased left-sided pleural effusion and left basilar heterogeneous / consolidative opacities.  Minimal right basilar opacities.  No definite right-sided pleural effusion.  No definite pneumothorax.  Redemonstrated multiple mildly displaced posterior and lateral left- sided rib fractures.  IMPRESSION: Interval increase in size of left sided pleural effusion and left basilar heterogeneous / consolidative opacities, possibly atelectasis though underlying infection is not excluded. A follow- up chest radiograph in 4 to 6 weeks after treatment is recommended to ensure resolution.   Original Report Authenticated By: Tacey Ruiz, MD    Medications:      . acetaminophen  500 mg Oral Q8H  . aspirin  81 mg Oral Daily  . atorvastatin  80 mg  Oral Daily  . calcium acetate  667 mg Oral TID WC  . [COMPLETED] ceFEPime (MAXIPIME) IV  2 g Intravenous Once  . ceFEPime (MAXIPIME) IV  2 g Intravenous Q T,Th,Sat-1800  . darbepoetin (ARANESP) injection - DIALYSIS  60 mcg Intravenous Q Tue-HD  . docusate sodium  100 mg Oral BID  . famotidine  20 mg Oral Daily  . ferric gluconate (FERRLECIT/NULECIT) IV  125 mg Intravenous Q Tue-HD  . [EXPIRED] HYDROcodone-acetaminophen      . insulin aspart  0-5 Units Subcutaneous QHS  . insulin aspart  0-9 Units Subcutaneous TID WC  . levofloxacin (LEVAQUIN) IV  500 mg Intravenous Once  . metoprolol tartrate  12.5 mg Oral BID  . paricalcitol  4 mcg Intravenous Q T,Th,Sa-HD  . sodium chloride  3 mL Intravenous Q12H  . [COMPLETED] vancomycin  1,500 mg Intravenous Once  . vancomycin  750 mg Intravenous Q T,Th,Sa-HD  . [DISCONTINUED] amLODipine  2.5 mg Oral Daily

## 2012-07-03 NOTE — Progress Notes (Signed)
VASCULAR PROGRESS NOTE  SUBJECTIVE: No complaints.  PHYSICAL EXAM: Filed Vitals:   07/02/12 2253 07/02/12 2325 07/03/12 0552 07/03/12 0943  BP: 115/48 138/50 119/42 114/42  Pulse: 103 68 87 87  Temp: 98.1 F (36.7 C) 98.1 F (36.7 C) 97.8 F (36.6 C) 97.6 F (36.4 C)  TempSrc: Oral Oral Oral Oral  Resp: 24 22 20 20   Height:  5\' 3"  (1.6 m)    Weight: 147 lb 0.8 oz (66.7 kg) 147 lb 0.8 oz (66.7 kg)    SpO2: 92% 92% 100% 98%   Good thrill in left upper arm AVG. Some bloody drainage from axillary incision (old blood)  LABS: Lab Results  Component Value Date   WBC 9.5 07/03/2012   HGB 9.4* 07/03/2012   HCT 29.0* 07/03/2012   MCV 98.3 07/03/2012   PLT 195 07/03/2012   CBG (last 3)   Basename 07/02/12 2323 07/02/12 1650 07/02/12 1217  GLUCAP 119* 202* 242*    ASSESSMENT/PLAN: 1. Continue dressing changes to left axilla 2. I can follow this as an outpt.  Cari Caraway Beeper: [phone] 07/03/2012

## 2012-07-03 NOTE — Progress Notes (Signed)
[facility]             Progress Note   Subjective: Denies any complaints, breathing ok, no further bleeding L arm feels ok  Objective: Vital signs in last 24 hours: Temp:  [97.6 F (36.4 C)-98.6 F (37 C)] 97.8 F (36.6 C) (11/10 0552) Pulse Rate:  [68-118] 87  (11/10 0552) Resp:  [11-24] 20  (11/10 0552) BP: (83-171)/(27-94) 119/42 mmHg (11/10 0552) SpO2:  [92 %-100 %] 100 % (11/10 0552) Weight:  [66.7 kg (147 lb 0.8 oz)-68.7 kg (151 lb 7.3 oz)] 66.7 kg (147 lb 0.8 oz) (11/09 2325) Weight change: -1.1 kg (-2 lb 6.8 oz) Last BM Date:  (PTA)  Intake/Output from previous day: 11/09 0701 - 11/10 0700 In: 480 [P.O.:480] Out: 1238      Physical Exam: General: Alert, awake, oriented x to self and  place,  in no acute distress. HEENT: No bruits, no goiter. Heart: Regular rate and rhythm, without murmurs, rubs, gallops. Lungs: ronchi at L base Abdomen: Soft, nontender, nondistended, positive bowel sounds. Extremities: No clubbing cyanosis or edema with positive pedal pulses. Neuro: Grossly intact, nonfocal. L arm with AVG with some old blood on dressing    Lab Results: Basic Metabolic Panel:  Basename 07/03/12 0550 07/02/12 0022  NA 134* 133*  K 4.1 3.9  CL 93* 91*  CO2 28 28  GLUCOSE 165* 188*  BUN 20 25*  CREATININE 6.02* 7.47*  CALCIUM 9.0 9.0  MG -- --  PHOS -- --   Liver Function Tests:  Basename 07/03/12 0550  AST 19  ALT 16  ALKPHOS 115  BILITOT 0.6  PROT 7.3  ALBUMIN 3.1*   No results found for this basename: LIPASE:2,AMYLASE:2 in the last 72 hours No results found for this basename: AMMONIA:2 in the last 72 hours CBC:  Basename 07/03/12 0550 07/02/12 2030  WBC 9.5 10.0  NEUTROABS -- --  HGB 9.4* 9.3*  HCT 29.0* 27.6*  MCV 98.3 96.2  PLT 195 182   Cardiac Enzymes: No results found for this basename: CKTOTAL:3,CKMB:3,CKMBINDEX:3,TROPONINI:3 in the last 72 hours BNP: No results found for this basename: PROBNP:3 in the last  72 hours D-Dimer: No results found for this basename: DDIMER:2 in the last 72 hours CBG:  Basename 07/02/12 2323 07/02/12 1650 07/02/12 1217 07/02/12 0737 07/01/12 1704 07/01/12 1125  GLUCAP 119* 202* 242* 159* 170* 218*   Hemoglobin A1C: No results found for this basename: HGBA1C in the last 72 hours Fasting Lipid Panel: No results found for this basename: CHOL,HDL,LDLCALC,TRIG,CHOLHDL,LDLDIRECT in the last 72 hours Thyroid Function Tests: No results found for this basename: TSH,T4TOTAL,FREET4,T3FREE,THYROIDAB in the last 72 hours Anemia Panel: No results found for this basename: VITAMINB12,FOLATE,FERRITIN,TIBC,IRON,RETICCTPCT in the last 72 hours Coagulation:  Basename 07/02/12 0022  LABPROT 13.6  INR 1.05   Urine Drug Screen: Drugs of Abuse     Component Value Date/Time   LABOPIA NONE DETECTED 11/04/2010 1514   COCAINSCRNUR NONE DETECTED 11/04/2010 1514   LABBENZ NONE DETECTED 11/04/2010 1514   AMPHETMU NONE DETECTED 11/04/2010 1514   THCU NONE DETECTED 11/04/2010 1514   LABBARB  Value: NONE DETECTED        DRUG SCREEN FOR MEDICAL PURPOSES ONLY.  IF CONFIRMATION IS NEEDED FOR ANY PURPOSE, NOTIFY LAB WITHIN 5 DAYS.        LOWEST DETECTABLE LIMITS FOR URINE DRUG SCREEN Drug Class       Cutoff (ng/mL) Amphetamine      1000 Barbiturate  200 Benzodiazepine   200 Tricyclics       300 Opiates          300 Cocaine          300 THC              50 11/04/2010 1514    Alcohol Level: No results found for this basename: ETH:2 in the last 72 hours Urinalysis: No results found for this basename: COLORURINE:2,APPERANCEUR:2,LABSPEC:2,PHURINE:2,GLUCOSEU:2,HGBUR:2,BILIRUBINUR:2,KETONESUR:2,PROTEINUR:2,UROBILINOGEN:2,NITRITE:2,LEUKOCYTESUR:2 in the last 72 hours  Recent Results (from the past 240 hour(s))  MRSA PCR SCREENING     Status: Normal   Collection Time   06/26/12  2:30 AM      Component Value Range Status Comment   MRSA by PCR NEGATIVE  NEGATIVE Final     Studies/Results: Dg Chest  2 View  07/02/2012  *RADIOLOGY REPORT*  Clinical Data: History of recent rib fractures and pneumothorax  CHEST - 2 VIEW  Comparison: 06/29/2012; 06/28/2012; 06/25/2012; 09/28/2011 chest CT - 06/25/2012  Findings:  Grossly unchanged cardiac silhouette and mediastinal contours with obscuration of the left heart border secondary to a minimally increased left-sided pleural effusion and left basilar heterogeneous / consolidative opacities.  Minimal right basilar opacities.  No definite right-sided pleural effusion.  No definite pneumothorax.  Redemonstrated multiple mildly displaced posterior and lateral left- sided rib fractures.  IMPRESSION: Interval increase in size of left sided pleural effusion and left basilar heterogeneous / consolidative opacities, possibly atelectasis though underlying infection is not excluded. A follow- up chest radiograph in 4 to 6 weeks after treatment is recommended to ensure resolution.   Original Report Authenticated By: Tacey Ruiz, MD     Medications: Scheduled Meds:    . acetaminophen  500 mg Oral Q8H  . aspirin  81 mg Oral Daily  . atorvastatin  80 mg Oral Daily  . calcium acetate  667 mg Oral TID WC  . [COMPLETED] ceFEPime (MAXIPIME) IV  2 g Intravenous Once  . ceFEPime (MAXIPIME) IV  2 g Intravenous Q T,Th,Sat-1800  . darbepoetin (ARANESP) injection - DIALYSIS  60 mcg Intravenous Q Tue-HD  . docusate sodium  100 mg Oral BID  . famotidine  20 mg Oral Daily  . ferric gluconate (FERRLECIT/NULECIT) IV  125 mg Intravenous Q Tue-HD  . [EXPIRED] HYDROcodone-acetaminophen      . insulin aspart  0-5 Units Subcutaneous QHS  . insulin aspart  0-9 Units Subcutaneous TID WC  . levofloxacin (LEVAQUIN) IV  500 mg Intravenous Once  . metoprolol tartrate  12.5 mg Oral BID  . paricalcitol  4 mcg Intravenous Q T,Th,Sa-HD  . sodium chloride  3 mL Intravenous Q12H  . [COMPLETED] vancomycin  1,500 mg Intravenous Once  . vancomycin  750 mg Intravenous Q T,Th,Sa-HD  .  [DISCONTINUED] amLODipine  2.5 mg Oral Daily  . [DISCONTINUED] vancomycin  1,500 mg Intravenous Once  . [DISCONTINUED] vancomycin  750 mg Intravenous Q T,Th,Sa-HD   Continuous Infusions:  PRN Meds:.sodium chloride, albuterol, guaiFENesin, HYDROcodone-acetaminophen, sodium chloride  Assessment/Plan:  Present on Admission:  . Hospital-acquired pneumonia - patient already finished a course of Levaquin and while she was in the hospital, continue cefepime and vancomycin., transition to Po 11/1 . End stage renal disease - on HD TTS per renal, avoid heparin at HD   . Pneumothorax, traumatic - per chest x-ray the pneumothorax has improved  . DIABETES MELLITUS, TYPE II, UNCONTROLLED - sliding-scale insulin , stable . L arm AVG with hematoma: s/p dressing by Dr.Dickson, graft can be used for  HD per [facility], was able to tolerate HD, no further bleeding.  Dementia: d/W PoA Orlie Dakin [name] she's working on getting her to [facility] eventually  PT/OT ambulate DC TEle DVT proph: SCDs FULL CODE: Dispo: home with [facility] services    Time spent coordinating care:   LOS: 1 day   [facility] Pager: [phone] 07/03/2012, 9:05 AM

## 2012-07-04 LAB — GLUCOSE, CAPILLARY
Glucose-Capillary: 170 mg/dL — ABNORMAL HIGH (ref 70–99)
Glucose-Capillary: 171 mg/dL — ABNORMAL HIGH (ref 70–99)

## 2012-07-04 MED ORDER — POLYETHYLENE GLYCOL 3350 17 G PO PACK
17.0000 g | PACK | Freq: Every day | ORAL | Status: DC | PRN
  Administered 2012-07-04: 17 g via ORAL
  Filled 2012-07-04: qty 1

## 2012-07-04 MED ORDER — SENNOSIDES-DOCUSATE SODIUM 8.6-50 MG PO TABS
2.0000 | ORAL_TABLET | Freq: Every day | ORAL | Status: DC
  Administered 2012-07-04: 2 via ORAL
  Filled 2012-07-04 (×2): qty 2

## 2012-07-04 MED ORDER — LEVOFLOXACIN 500 MG PO TABS: 500.0000 mg | ORAL_TABLET | ORAL | Status: DC

## 2012-07-04 NOTE — Progress Notes (Signed)
Subjective:  No current complaints, still has L chest pain with cough or deep breaths (site of rib fx).  Objective: Vital signs in last 24 hours: Temp:  [97.6 F (36.4 C)-98.1 F (36.7 C)] 98.1 F (36.7 C) (11/11 0439) Pulse Rate:  [84-99] 85  (11/11 0439) Resp:  [20] 20  (11/11 0439) BP: (112-149)/(40-48) 135/40 mmHg (11/11 0439) SpO2:  [98 %-100 %] 99 % (11/11 0439) Weight:  [66.7 kg (147 lb 0.8 oz)] 66.7 kg (147 lb 0.8 oz) (11/10 2102) Weight change: -2 kg (-4 lb 6.6 oz)  Intake/Output from previous day: 11/10 0701 - 11/11 0700 In: 600 [P.O.:600] Out: -    EXAM: General appearance:  Alert, in no apparent distress Resp:  Poor effort, secondary to chest pain; decreased at bases              Crackles L base Cardio:  RRR without murmur or rub GI:  + BS, soft and nontender Extremities:  No edema Access:  AVG @ LUA with + bruit  Lab Results:  Basename 07/03/12 0550 07/02/12 2030  WBC 9.5 10.0  HGB 9.4* 9.3*  HCT 29.0* 27.6*  PLT 195 182   BMET:  Basename 07/03/12 0550 07/02/12 0022  NA 134* 133*  K 4.1 3.9  CL 93* 91*  CO2 28 28  GLUCOSE 165* 188*  BUN 20 25*  CREATININE 6.02* 7.47*  CALCIUM 9.0 9.0  ALBUMIN 3.1* --   No results found for this basename: PTH:2 in the last 72 hours Iron Studies: No results found for this basename: IRON,TIBC,TRANSFERRIN,FERRITIN in the last 72 hours  Dialysis Orders: Center: [facility] on TTS.  EDW 68.5 kg HD Bath 2K/2.25Ca Time 3 hrs 45 mins Heparin 9000 U. Access AVG @ LUA BFR 400 DFR A1.5  Zemplar 4 mcg IV/HD Epogen 2600 Units IV/HD Venofer 100 mg on Thurs  Assessment/Plan: 1. Multiple rib fractures/small PTX - pain with movement and deep inspirations, off narcotics, on Colace and PRN Miralax. 2. LLL Pneumonia - per chest x-ray 11/8, on Vancomycin and Maxepine IV. OK with me to switch to po 3. Clotted access - s/p thrombectomy and revision of AVG @ LUA on 11/5 per Dr. Edilia Bo of [facility]. No further oozing 4. ESRD - HD on TTS @ [facility]; K  4.1 yesterday.  5. Hypertension/volume - BP 135/40 on Norvasc 10 mg qhs and Metoprolol 25 mg bid; wt 66.7 kg yesterday with EDW of 68.5 kg.Try for 66 kg tomorrow 6. Anemia - Hgb 9.4, on outpatient Epogen 2600 U and weekly Fe. Continue IV Fe on Thurs, Aranesp 60 mcg on Tues. Check Fe studies in HD tomorrow 7. Metabolic bone disease - Ca 9 (9.7 corrected), P 3.1, last iPTH 233 on 10/17; on Zemplar 4 mcg, Phoslo 2 with meals and 1 with snacks.  8. Nutrition - Alb 3.1, high protein renal diet.  9. DM Type 2 - on Januvia and Glucotrol.  10. Hx CVA - TIA with AMS in 10/2010    LOS: 2 days   LYLES,CHARLES 07/04/2012,9:04 AM I spoke with and examined Claudia Rich. She remembers she gets HD at [facility]. She feels better and is responding to therapy.  She should be switched to po antibiotics, in my opinion.

## 2012-07-04 NOTE — Evaluation (Signed)
Physical Therapy Evaluation Patient Details Name: Claudia Rich MRN: [id number] DOB: 1927/04/13 Today's Date: 07/04/2012 Time: 4098-1191 PT Time Calculation (min): 12 min  PT Assessment / Plan / Recommendation Clinical Impression  Pt readmitted with PNA and hematoma of lt AVG graft.  Pt had previously been adm for fall with lt rib fxs and pneumothorax.  Pt with improving mobility.  Should be able to return home with family.    PT Assessment  Patient needs continued PT services    Follow Up Recommendations  Home health PT;Supervision - Intermittent    Does the patient have the potential to tolerate intense rehabilitation      Barriers to Discharge        Equipment Recommendations  None recommended by PT    Recommendations for Other Services     Frequency Min 3X/week    Precautions / Restrictions Precautions Precautions: Fall   Pertinent Vitals/Pain Lt arm and rib soreness.      Mobility  Transfers Sit to Stand: 5: Supervision;With upper extremity assist;With armrests;From chair/3-in-1 Stand to Sit: 5: Supervision;With upper extremity assist;With armrests;To chair/3-in-1 Details for Transfer Assistance: Incr time Ambulation/Gait Ambulation/Gait Assistance: 4: Min assist;5: Supervision Ambulation Distance (Feet): 130 Feet Assistive device: Rolling walker;None Ambulation/Gait Assistance Details: Min A without assistive device and supervision while using rolling walker. Gait Pattern: Step-through pattern;Decreased step length - right;Decreased step length - left Gait velocity: decr    Shoulder Instructions     Exercises     PT Diagnosis: Difficulty walking;Generalized weakness  PT Problem List: Decreased strength;Decreased activity tolerance;Decreased knowledge of use of DME;Decreased mobility;Decreased balance PT Treatment Interventions: DME instruction;Gait training;Functional mobility training;Patient/family education;Therapeutic activities;Balance  training;Therapeutic exercise   PT Goals Acute Rehab PT Goals PT Goal Formulation: With patient Time For Goal Achievement: 07/11/12 Potential to Achieve Goals: Good Pt will go Supine/Side to Sit: with modified independence PT Goal: Supine/Side to Sit - Progress: Goal set today Pt will go Sit to Supine/Side: with modified independence PT Goal: Sit to Supine/Side - Progress: Goal set today Pt will go Sit to Stand: with modified independence PT Goal: Sit to Stand - Progress: Goal set today Pt will go Stand to Sit: with modified independence PT Goal: Stand to Sit - Progress: Goal set today Pt will Ambulate: 51 - 150 feet;with modified independence;with least restrictive assistive device PT Goal: Ambulate - Progress: Goal set today  Visit Information  Last PT Received On: 07/04/12 Assistance Needed: +1    Subjective Data  Subjective: Pt reports she is slowly feeling better. Patient Stated Goal: Return home.   Prior Functioning  Home Living Lives With: Family Available Help at Discharge: Personal care attendant Type of Home: House Home Access: Stairs to enter [employer] of Steps: 1 Home Layout: One level Bathroom Shower/Tub: Engineer, manufacturing systems: Standard Bathroom Accessibility: Yes Home Adaptive Equipment: Walker - rolling;Bedside commode/3-in-1 Prior Function Level of Independence: Independent with assistive device(s) Comments: Prior to last hospitalization. Communication Communication: No difficulties Dominant Hand: Right    Cognition  Overall Cognitive Status: History of cognitive impairments - at baseline Arousal/Alertness: Awake/alert Orientation Level: Appears intact for tasks assessed Behavior During Session: [facility] for tasks performed    Extremity/Trunk Assessment Right Lower Extremity Assessment RLE ROM/Strength/Tone: Deficits RLE ROM/Strength/Tone Deficits: grossly 4/5 Left Lower Extremity Assessment LLE ROM/Strength/Tone: Deficits LLE  ROM/Strength/Tone Deficits: grossly 4/5   Balance Static Standing Balance Static Standing - Balance Support: No upper extremity supported Static Standing - Level of Assistance: 5: Stand by assistance  End  of Session PT - End of Session Activity Tolerance: Patient tolerated treatment well Patient left: in chair;with call Sinha/phone within reach  GP     [facility] 07/04/2012, 12:25 PM  [facility] PT [phone]

## 2012-07-04 NOTE — Evaluation (Signed)
Occupational Therapy Evaluation Patient Details Name: Claudia Rich MRN: [id number] DOB: 07/21/1927 Today's Date: 07/04/2012 Time: 4098-1191 OT Time Calculation (min): 24 min  OT Assessment / Plan / Recommendation Clinical Impression  78 yo female admitted with PNA and recent d/c from hospital with pneuothorax. Pt demonstrates cognitive deficits and high fall risk. OT to follow acutely.    OT Assessment  Patient needs continued OT Services    Follow Up Recommendations  SNF    Barriers to Discharge      Equipment Recommendations  None recommended by OT    Recommendations for Other Services    Frequency  Min 2X/week    Precautions / Restrictions Precautions Precautions: Fall Restrictions Weight Bearing Restrictions: No   Pertinent Vitals/Pain Required rest break and reports dizziness with ambulation     ADL  Grooming: Performed;Teeth care Where Assessed - Grooming: Supported standing (swaying standing) Toilet Transfer: Performed;Minimal Dentist Method: Sit to Barista: Raised toilet seat with arms (or 3-in-1 over toilet) Toileting - Clothing Manipulation and Hygiene: Performed;Minimal assistance Where Assessed - Toileting Clothing Manipulation and Hygiene: Sit to stand from 3-in-1 or toilet Equipment Used: Gait belt (hand held (A)) Transfers/Ambulation Related to ADLs: pt ambulating to sink with restbreak for ~5 minutes. Pt static standing at sink with unsteady standing balance and then ambulated to restroom ADL Comments: pt placed tooth paste in mouth then proceeded to brush teeth with index finger with tooth brush present. Pt repeating brushing with finger then rinsing with cup. pt rinsed mouth x2 then brushed with tooth brush. pt demonstrates cognition deficits.     OT Diagnosis: Generalized weakness  OT Problem List: Decreased strength;Decreased activity tolerance;Impaired balance (sitting and/or standing);Decreased safety  awareness;Decreased knowledge of use of DME or AE;Decreased knowledge of precautions;Cardiopulmonary status limiting activity;Impaired UE functional use;Pain;Decreased cognition OT Treatment Interventions: Self-care/ADL training;Therapeutic exercise;DME and/or AE instruction;Therapeutic activities;Patient/family education;Balance training   OT Goals Acute Rehab OT Goals OT Goal Formulation: With patient Time For Goal Achievement: 07/18/12 Potential to Achieve Goals: Good ADL Goals Pt Will Perform Grooming: with set-up;Standing at sink ADL Goal: Grooming - Progress: Goal set today Pt Will Perform Upper Body Bathing: with set-up;Sit to stand from chair ADL Goal: Upper Body Bathing - Progress: Goal set today Pt Will Perform Lower Body Bathing: with min assist;Sit to stand from chair ADL Goal: Lower Body Bathing - Progress: Goal set today Pt Will Perform Upper Body Dressing: with set-up;Sit to stand from chair ADL Goal: Upper Body Dressing - Progress: Goal set today Pt Will Perform Lower Body Dressing: with min assist;Sit to stand from chair ADL Goal: Lower Body Dressing - Progress: Goal set today Pt Will Transfer to Toilet: with set-up;with DME ADL Goal: Toilet Transfer - Progress: Goal set today  Visit Information  Last OT Received On: 07/04/12 Assistance Needed: +1    Subjective Data  Subjective: "she comes 4-5 and stay until......"- pt unable to recall hours for aide , poor recall Patient Stated Goal: to return home   Prior Functioning     Home Living Lives With: Family Available Help at Discharge: Personal care attendant Type of Home: House Home Access: Stairs to enter [employer] of Steps: 1 Home Layout: One level Bathroom Shower/Tub: Engineer, manufacturing systems: Standard Bathroom Accessibility: Yes Home Adaptive Equipment: Walker - rolling;Bedside commode/3-in-1 Prior Function Level of Independence: Independent with assistive device(s) Comments: pt was  poor historian of PTA and unable to verify information Communication Communication: No difficulties Dominant Hand: Right  Vision/Perception     Cognition  Overall Cognitive Status: History of cognitive impairments - at baseline Area of Impairment: Safety/judgement;Awareness of errors;Awareness of deficits Arousal/Alertness: Awake/alert Orientation Level: Appears intact for tasks assessed Behavior During Session: [facility] for tasks performed Safety/Judgement: Decreased awareness of safety precautions;Decreased safety judgement for tasks assessed Awareness of Errors: Assistance required to identify errors made Cognition - Other Comments: pt unaware of error with oral care    Extremity/Trunk Assessment Right Upper Extremity Assessment RUE ROM/Strength/Tone: Within functional levels Left Upper Extremity Assessment LUE ROM/Strength/Tone: Within functional levels Right Lower Extremity Assessment RLE ROM/Strength/Tone: Deficits RLE ROM/Strength/Tone Deficits: grossly 4/5 Left Lower Extremity Assessment LLE ROM/Strength/Tone: Deficits LLE ROM/Strength/Tone Deficits: grossly 4/5     Mobility Bed Mobility Bed Mobility: Supine to Sit;Sitting - Scoot to Edge of Bed Supine to Sit: 4: Min guard;With rails;HOB elevated Sitting - Scoot to Edge of Bed: 4: Min assist;With rail Details for Bed Mobility Assistance: Pt required extended time but progressing to eob  Transfers Sit to Stand: 4: Min guard;With upper extremity assist;From chair/3-in-1 Stand to Sit: 4: Min guard;With upper extremity assist;To chair/3-in-1 Details for Transfer Assistance: Incr time     Shoulder Instructions     Exercise     Balance Static Standing Balance Static Standing - Balance Support: No upper extremity supported Static Standing - Level of Assistance: 5: Stand by assistance   End of Session OT - End of Session Activity Tolerance: Patient tolerated treatment well Patient left: in chair;with call  Ovitt/phone within reach;with family/visitor present Nurse Communication: Mobility status;Precautions  GO     Lucile Shutters 07/04/2012, 2:47 PM

## 2012-07-04 NOTE — Progress Notes (Signed)
   CARE MANAGEMENT NOTE 07/04/2012  Patient:  Claudia Rich, Claudia Rich   Account Number:  [id number]  Date Initiated:  07/04/2012  Documentation initiated by:  [name]  Subjective/Objective Assessment:   Patient admitted with HCAP. Discharged to home on 07/01/2012 with [facility] home health RN, PT, OT.     Action/Plan:   Progression of care and discharge planning   Anticipated DC Date:     Anticipated DC Plan:        DC Planning Services  CM consult      Choice offered to / List presented to:          [facility] arranged  HH-3 OT      [facility] agency  [facility]   Status of service:  In process, will continue to follow Medicare Important Message given?   (If response is "NO", the following Medicare IM given date fields will be blank) Date Medicare IM given:   Date Additional Medicare IM given:    Discharge Disposition:    Per UR Regulation:  Reviewed for med. necessity/level of care/duration of stay  If discussed at Long Length of Stay Meetings, dates discussed:    Comments:  07/04/2012   [address] RN, [place]   [phone] Prior to admission active with [facility]/ RN, PT,OT Niece POA:  [name]   [phone]  Call to [facility] regarding discharge planning. Possible discharge for today, NCM contacted [facility] regarding admission. Discussed plans for transfer to [facility] once stable, suggested she contact local home care agency regarding the process needed for transfer of care, she may need a local MD to follow her while in [place].

## 2012-07-04 NOTE — Progress Notes (Addendum)
[facility]             Progress Note   Subjective: Denies any complaints, breathing ok, occasional pain at rib fracture site with deep breaths, no further bleeding L arm feels ok  Objective: Vital signs in last 24 hours: Temp:  [97.9 F (36.6 C)-98.5 F (36.9 C)] 98.5 F (36.9 C) (11/11 1018) Pulse Rate:  [76-99] 76  (11/11 1018) Resp:  [18-20] 18  (11/11 1018) BP: (112-149)/(40-55) 122/55 mmHg (11/11 1018) SpO2:  [97 %-100 %] 97 % (11/11 1018) Weight:  [66.7 kg (147 lb 0.8 oz)] 66.7 kg (147 lb 0.8 oz) (11/10 2102) Weight change: -2 kg (-4 lb 6.6 oz) Last BM Date:  (PTA)  Intake/Output from previous day: 11/10 0701 - 11/11 0700 In: 600 [P.O.:600] Out: -      Physical Exam: General: Alert, awake, oriented x to self and  place,  in no acute distress. HEENT: No bruits, no goiter. Heart: Regular rate and rhythm, without murmurs, rubs, gallops. Lungs: ronchi at L base Abdomen: Soft, nontender, nondistended, positive bowel sounds. Extremities: No clubbing cyanosis or edema with positive pedal pulses. Neuro: Grossly intact, nonfocal. L arm with AVG with some old blood on dressing    Lab Results: Basic Metabolic Panel:  Basename 07/03/12 0550 07/02/12 0022  NA 134* 133*  K 4.1 3.9  CL 93* 91*  CO2 28 28  GLUCOSE 165* 188*  BUN 20 25*  CREATININE 6.02* 7.47*  CALCIUM 9.0 9.0  MG -- --  PHOS -- --   Liver Function Tests:  Basename 07/03/12 0550  AST 19  ALT 16  ALKPHOS 115  BILITOT 0.6  PROT 7.3  ALBUMIN 3.1*   No results found for this basename: LIPASE:2,AMYLASE:2 in the last 72 hours No results found for this basename: AMMONIA:2 in the last 72 hours CBC:  Basename 07/03/12 0550 07/02/12 2030  WBC 9.5 10.0  NEUTROABS -- --  HGB 9.4* 9.3*  HCT 29.0* 27.6*  MCV 98.3 96.2  PLT 195 182   Cardiac Enzymes: No results found for this basename: CKTOTAL:3,CKMB:3,CKMBINDEX:3,TROPONINI:3 in the last 72 hours BNP: No results found for this  basename: PROBNP:3 in the last 72 hours D-Dimer: No results found for this basename: DDIMER:2 in the last 72 hours CBG:  Basename 07/03/12 2105 07/03/12 1705 07/03/12 1139 07/03/12 0811 07/02/12 2323 07/02/12 1650  GLUCAP 228* 154* 239* 151* 119* 202*   Hemoglobin A1C: No results found for this basename: HGBA1C in the last 72 hours Fasting Lipid Panel: No results found for this basename: CHOL,HDL,LDLCALC,TRIG,CHOLHDL,LDLDIRECT in the last 72 hours Thyroid Function Tests: No results found for this basename: TSH,T4TOTAL,FREET4,T3FREE,THYROIDAB in the last 72 hours Anemia Panel: No results found for this basename: VITAMINB12,FOLATE,FERRITIN,TIBC,IRON,RETICCTPCT in the last 72 hours Coagulation:  Basename 07/02/12 0022  LABPROT 13.6  INR 1.05   Urine Drug Screen: Drugs of Abuse     Component Value Date/Time   LABOPIA NONE DETECTED 11/04/2010 1514   COCAINSCRNUR NONE DETECTED 11/04/2010 1514   LABBENZ NONE DETECTED 11/04/2010 1514   AMPHETMU NONE DETECTED 11/04/2010 1514   THCU NONE DETECTED 11/04/2010 1514   LABBARB  Value: NONE DETECTED        DRUG SCREEN FOR MEDICAL PURPOSES ONLY.  IF CONFIRMATION IS NEEDED FOR ANY PURPOSE, NOTIFY LAB WITHIN 5 DAYS.        LOWEST DETECTABLE LIMITS FOR URINE DRUG SCREEN Drug Class       Cutoff (ng/mL) Amphetamine      1000 Barbiturate  200 Benzodiazepine   200 Tricyclics       300 Opiates          300 Cocaine          300 THC              50 11/04/2010 1514    Alcohol Level: No results found for this basename: ETH:2 in the last 72 hours Urinalysis: No results found for this basename: COLORURINE:2,APPERANCEUR:2,LABSPEC:2,PHURINE:2,GLUCOSEU:2,HGBUR:2,BILIRUBINUR:2,KETONESUR:2,PROTEINUR:2,UROBILINOGEN:2,NITRITE:2,LEUKOCYTESUR:2 in the last 72 hours  Recent Results (from the past 240 hour(s))  MRSA PCR SCREENING     Status: Normal   Collection Time   06/26/12  2:30 AM      Component Value Range Status Comment   MRSA by PCR NEGATIVE  NEGATIVE Final       Studies/Results: No results found.  Medications: Scheduled Meds:    . acetaminophen  500 mg Oral Q8H  . aspirin  81 mg Oral Daily  . atorvastatin  80 mg Oral Daily  . calcium acetate  667 mg Oral TID WC  . ceFEPime (MAXIPIME) IV  2 g Intravenous Q T,Th,Sat-1800  . darbepoetin (ARANESP) injection - DIALYSIS  60 mcg Intravenous Q Tue-HD  . docusate sodium  100 mg Oral BID  . famotidine  20 mg Oral Daily  . ferric gluconate (FERRLECIT/NULECIT) IV  125 mg Intravenous Q Tue-HD  . insulin aspart  0-5 Units Subcutaneous QHS  . insulin aspart  0-9 Units Subcutaneous TID WC  . [COMPLETED] levofloxacin (LEVAQUIN) IV  500 mg Intravenous Once  . metoprolol tartrate  12.5 mg Oral BID  . multivitamin  1 tablet Oral Daily  . paricalcitol  4 mcg Intravenous Q T,Th,Sa-HD  . sodium chloride  3 mL Intravenous Q12H  . vancomycin  750 mg Intravenous Q T,Th,Sa-HD   Continuous Infusions:  PRN Meds:.sodium chloride, albuterol, guaiFENesin, HYDROcodone-acetaminophen, sodium chloride  Assessment/Plan:  Present on Admission:  . Hospital-acquired pneumonia - patient already finished a course of Levaquin and while she was in the hospital, treated with IV  cefepime and vancomycin., transition to Po levaquin today   . End stage renal disease - on HD TTS per renal, avoid heparin at HD    . Pneumothorax, traumatic - per chest x-ray the pneumothorax has improved   . DIABETES MELLITUS, TYPE II, UNCONTROLLED - sliding-scale insulin , stable  . L arm AVG with hematoma: s/p dressing by Dr.Dickson, graft used for HD per [facility], was able to tolerate HD, no further bleeding.  Dementia: d/W PoA Orlie Dakin [name] she's working on getting her to [facility] eventually  PT/OT ambulate DVT proph: SCDs FULL CODE: Dispo: home with [facility] services tomorrow after HD, discussed with Dr.Fox    Time spent coordinating care:   LOS: 2 days   [facility] Pager: [phone] 07/04/2012, 11:53  AM

## 2012-07-04 NOTE — Progress Notes (Signed)
VASCULAR PROGRESS NOTE  SUBJECTIVE: Comfortable  PHYSICAL EXAM: Filed Vitals:   07/03/12 1756 07/03/12 2102 07/04/12 0439 07/04/12 1018  BP: 135/44 149/41 135/40 122/55  Pulse: 86 84 85 76  Temp: 98.1 F (36.7 C) 98 F (36.7 C) 98.1 F (36.7 C) 98.5 F (36.9 C)  TempSrc: Oral Oral Oral Oral  Resp: 20 20 20 18   Height:  5\' 3"  (1.6 m)    Weight:  147 lb 0.8 oz (66.7 kg)    SpO2: 99% 100% 99% 97%   Minimal drainage from axillary incision Good thrill in left AVG  LABS: Lab Results  Component Value Date   WBC 9.5 07/03/2012   HGB 9.4* 07/03/2012   HCT 29.0* 07/03/2012   MCV 98.3 07/03/2012   PLT 195 07/03/2012   Lab Results  Component Value Date   CREATININE 6.02* 07/03/2012   Lab Results  Component Value Date   INR 1.05 07/02/2012   CBG (last 3)   Basename 07/03/12 2105 07/03/12 1705 07/03/12 1139  GLUCAP 228* 154* 239*     ASSESSMENT/PLAN: 1.Continue dressing changes. When she is D/C, I will follow this as an outpt.  Cari Caraway Beeper: [phone] 07/04/2012

## 2012-07-04 NOTE — Progress Notes (Signed)
Inpatient Diabetes Program Recommendations  AACE/ADA: New Consensus Statement on Inpatient Glycemic Control (2013)  Target Ranges:  Prepandial:   less than 140 mg/dL      Peak postprandial:   less than 180 mg/dL (1-2 hours)      Critically ill patients:  140 - 180 mg/dL   Results for Claudia Rich, Claudia Rich (MRN [id number]) as of 07/04/2012 12:22  Ref. Range 07/03/2012 08:11 07/03/2012 11:39 07/03/2012 17:05 07/03/2012 21:05  Glucose-Capillary Latest Range: 70-99 mg/dL 409 (H) 811 (H) 914 (H) 228 (H)   Patient eating 50-75% of meals.  Takes Januvia and Glucotrol at home.  Inpatient Diabetes Program Recommendations Oral Agents: Please consider adding home Januvia dose- 25 mg daily (pharmacy will substitute Tradjenta).  Note: Will follow. Ambrose Finland RN, MSN, CDE Diabetes Coordinator Inpatient Diabetes Program [phone]

## 2012-07-05 ENCOUNTER — Inpatient Hospital Stay (HOSPITAL_COMMUNITY): Payer: Medicare HMO

## 2012-07-05 LAB — RENAL FUNCTION PANEL
CO2: 23 mEq/L (ref 19–32)
Calcium: 9.2 mg/dL (ref 8.4–10.5)
Creatinine, Ser: 9.18 mg/dL — ABNORMAL HIGH (ref 0.50–1.10)
GFR calc Af Amer: 4 mL/min — ABNORMAL LOW (ref >90)
Glucose, Bld: 169 mg/dL — ABNORMAL HIGH (ref 70–99)
Sodium: 133 mEq/L — ABNORMAL LOW (ref 135–145)

## 2012-07-05 LAB — CBC
Hemoglobin: 9 g/dL — ABNORMAL LOW (ref 12.0–15.0)
MCH: 31.4 pg (ref 26.0–34.0)
MCV: 95.8 fL (ref 78.0–100.0)
RBC: 2.87 MIL/uL — ABNORMAL LOW (ref 3.87–5.11)

## 2012-07-05 LAB — IRON AND TIBC
Saturation Ratios: 27 % (ref 20–55)
TIBC: 209 ug/dL — ABNORMAL LOW (ref 250–470)
UIBC: 153 ug/dL (ref 125–400)

## 2012-07-05 MED ORDER — HYDROCODONE-ACETAMINOPHEN 5-325 MG PO TABS
ORAL_TABLET | ORAL | Status: AC
  Administered 2012-07-05: 1 via ORAL
  Filled 2012-07-05: qty 1

## 2012-07-05 MED ORDER — PARICALCITOL 5 MCG/ML IV SOLN
INTRAVENOUS | Status: AC
  Administered 2012-07-05: 4 ug via INTRAVENOUS
  Filled 2012-07-05: qty 1

## 2012-07-05 MED ORDER — POLYETHYLENE GLYCOL 3350 17 G PO PACK: 17.0000 g | PACK | Freq: Every day | ORAL | Status: DC | PRN

## 2012-07-05 MED ORDER — LEVOFLOXACIN 500 MG PO TABS: 500.0000 mg | ORAL_TABLET | ORAL | Status: DC

## 2012-07-05 MED ORDER — DARBEPOETIN ALFA-POLYSORBATE 60 MCG/0.3ML IJ SOLN
INTRAMUSCULAR | Status: AC
  Administered 2012-07-05: 60 ug via INTRAVENOUS
  Filled 2012-07-05: qty 0.3

## 2012-07-05 NOTE — Progress Notes (Signed)
Patient was discharged home with discharge instructions and prescriptions. Patient was stable upon discharge. Patient was told to call MD with questions and to go to the emergency room in case of an emergency. Family was present upon discharge.

## 2012-07-05 NOTE — Progress Notes (Signed)
Subjective:   Seen on HD, comfortable with no current complaints; left-side chest pain with cough and deep breaths.  Objective: Vital signs in last 24 hours: Temp:  [98.1 F (36.7 C)-98.5 F (36.9 C)] 98.3 F (36.8 C) (11/12 0546) Pulse Rate:  [76-84] 84  (11/12 0546) Resp:  [16-18] 16  (11/12 0546) BP: (119-167)/(55-68) 167/62 mmHg (11/12 0546) SpO2:  [90 %-97 %] 92 % (11/12 0546) Weight:  [66.7 kg (147 lb 0.8 oz)] 66.7 kg (147 lb 0.8 oz) (11/11 2022) Weight change: 0 kg (0 lb)  Intake/Output from previous day: 11/11 0701 - 11/12 0700 In: 770 [P.O.:770] Out: 100 [Urine:100]   EXAM: General appearance:  Alert, in no apparent distress Resp:  Poor effort, secondary to chest pain, crackles at L base Cardio:  RRR without murmur or rub GI:  + BS, soft and nontender Extremities:  No edema Access:  AVG @ LUA with BFR 400 cc/min  Lab Results:  Basename 07/03/12 0550 07/02/12 2030  WBC 9.5 10.0  HGB 9.4* 9.3*  HCT 29.0* 27.6*  PLT 195 182   BMET:  Basename 07/03/12 0550  NA 134*  K 4.1  CL 93*  CO2 28  GLUCOSE 165*  BUN 20  CREATININE 6.02*  CALCIUM 9.0  ALBUMIN 3.1*   No results found for this basename: PTH:2 in the last 72 hours Iron Studies: No results found for this basename: IRON,TIBC,TRANSFERRIN,FERRITIN in the last 72 hours  Dialysis Orders: Center: [facility] on TTS.  EDW 68.5 kg HD Bath 2K/2.25Ca Time 3 hrs 45 mins Heparin 9000 U. Access AVG @ LUA BFR 400 DFR A1.5  Zemplar 4 mcg IV/HD Epogen 2600 Units IV/HD Venofer 100 mg on Thurs  Assessment/Plan: 1. Multiple rib fractures/small PTX - pain with movement and deep inspirations, off narcotics, on Colace and PRN Miralax. 2. LLL Pneumonia - per chest x-ray 11/8, now on Levaquin 500 mg Po q48h. 3. Clotted access - s/p thrombectomy and revision of AVG @ LUA on 11/5 per Dr. Edilia Bo of [facility], no further drainage. 4. ESRD - HD on TTS @ [facility]; K 4.1 yesterday.  5. Hypertension/volume - BP 161/64 on Norvasc 10 mg qhs and  Metoprolol 25 mg bid; wt 66.7 kg yesterday with EDW of 68.5 kg.  Goal of 66 kg today. 6. Anemia - Hgb 9.4, on outpatient Epogen 2600 U and weekly Fe; on IV Fe on Thurs, Aranesp 60 mcg on Tues; Fe studies pending. 7. Metabolic bone disease - Ca 9 (9.7 corrected), P 3.1, last iPTH 233 on 10/17; on Zemplar 4 mcg, Phoslo 2 with meals and 1 with snacks.  8. Nutrition - Alb 3.1, high protein renal diet.  9. DM Type 2 - on Januvia and Glucotrol.  10. Hx CVA - TIA with [facility] in 10/2010    LOS: 3 days   Claudia Rich,Claudia Rich 07/05/2012,7:17 AM Mrs. Schooler feels better, though "not back to normal."  D/C planned for today.  She is currently on HD via L upper arm AVG.  BP 112/43, goal 3.1 L.  K 4.1, hgb 9.4.I agree with plans as outlined above--discussed with C. Lyles, PA-C and with Dr. Mitchel Honour.

## 2012-07-05 NOTE — Progress Notes (Signed)
VASCULAR PROGRESS NOTE  SUBJECTIVE: Comfortable   PHYSICAL EXAM: Filed Vitals:   07/05/12 1336  BP: 151/78  Pulse: 99  Temp: 98 F (36.7 C)  Resp: 16   Good thrill in left AVG Minimal drainage from left axillary incision.  LABS: Lab Results  Component Value Date   WBC 8.5 07/05/2012   HGB 9.0* 07/05/2012   HCT 27.5* 07/05/2012   MCV 95.8 07/05/2012   PLT 265 07/05/2012   ASSESSMENT/PLAN: Continue dressing changes. I have arranged a f/u visit in 2 weeks.  Will be available if needed.  Cari Caraway Beeper: [phone] 07/05/2012

## 2012-07-05 NOTE — Progress Notes (Signed)
[facility] called with referral for RN, PT, OT and aide.

## 2012-07-05 NOTE — Discharge Summary (Signed)
Physician Discharge Summary  Patient ID: CRYSTALANN KORF MRN: [id number] DOB/AGE: [birth year] 76 y.o.  Admit date: 07/02/2012 Discharge date: 07/05/2012  Primary Care Physician:  Lillia Abed, MD   Disposition and Follow-up:  1. PCP in 1 week 2. Dr.Dickson in 2-3 weeks 3. Home health services PT/OT/RN/Aide   Discharge Diagnoses:    Principal Problem:  1. *Hospital-acquired pneumonia  2. L rib fractures  3. Pleuritic chest pain: due to 1 and 2, improved  4. DIABETES MELLITUS, TYPE II, UNCONTROLLED  5. End stage renal disease 6. Bleeding AVG resolved 7. Recent Pneumothorax, traumatic -resolved 8. Mild  Dementia vs early cognitive dysfunction 9. HTn 10. hyperlipidemia 11. Anemia of chronic disease    Medication List     As of 07/05/2012 11:48 AM    TAKE these medications         acetaminophen 500 MG tablet   Commonly known as: TYLENOL   Take 1 tablet (500 mg total) by mouth every 8 (eight) hours. Take for 5 days then use as needed.      amLODipine 2.5 MG tablet   Commonly known as: NORVASC   Take 1 tablet (2.5 mg total) by mouth daily.      aspirin 81 MG chewable tablet   Chew 81 mg by mouth daily.      atorvastatin 80 MG tablet   Commonly known as: LIPITOR   Take 1 tablet (80 mg total) by mouth daily.      calcium acetate 667 MG capsule   Commonly known as: PHOSLO   Take 667 mg by mouth 2 (two) times daily.      DSS 100 MG Caps   Take 100 mg by mouth 2 (two) times daily.      glipiZIDE 10 MG tablet   Commonly known as: GLUCOTROL   Take 2 tablets (20 mg total) by mouth 2 (two) times daily before a meal.      HYDROcodone-acetaminophen 5-325 MG per tablet   Commonly known as: NORCO/VICODIN   Take 1-2 tablets by mouth every 4 (four) hours as needed for pain.      levofloxacin 500 MG tablet   Commonly known as: LEVAQUIN   Take 1 tablet (500 mg total) by mouth every other day. For 4 days      metoprolol tartrate 25 MG tablet   Commonly known as:  LOPRESSOR   TAKE ONE TABLET TWICE DAILY FOR BLOOD   PRESSURE                                                  GENERIC FOR LOPRESSO      ondansetron 4 MG tablet   Commonly known as: ZOFRAN   Take 4 mg by mouth every 8 (eight) hours as needed. For nausea.      polyethylene glycol packet   Commonly known as: MIRALAX / GLYCOLAX   Take 17 g by mouth daily as needed.      ranitidine 150 MG tablet   Commonly known as: ZANTAC   Take 1 tablet (150 mg total) by mouth 2 (two) times daily.      RENAL MULTIVITAMIN FORMULA PO   Take 1 tablet by mouth daily.      sitaGLIPtin 25 MG tablet   Commonly known as: JANUVIA   Take 1 tablet (25 mg total) by mouth daily.  VENTOLIN HFA 108 (90 BASE) MCG/ACT inhaler   Generic drug: albuterol   INHALE AS NEEDED FOR WHEEZING EVERY 4   HOURS        Consults:   [facility]-dr.Dickson Renal Dr.Fox   Significant Diagnostic Studies:  CXR IMPRESSION: Interval increase in size of left sided pleural effusion and left basilar heterogeneous / consolidative opacities, possibly atelectasis though underlying infection is not excluded. A follow- up chest radiograph in 4 to 6 weeks after treatment is recommended to ensure resolution.   Brief H and P: SASHA WILDS is a 78 y.o. female with multiple med problems was just discharged [facility] yesterday afternoon after an admission for the fall. During that admit she had clotted off her AVG and she had to have thrombectomy. She returned to ER because her arm swelled up and the site began to bleed.  She gets dialyses Tuesday, Thursday and Saturday and is due for this AM.   She has been having chest pain only with cough. She states her cough have been getting worse. No fever or chills. Family states overall she have not done well since her discharge and has trouble with pain and taking good breaths.     Hospital Course:   1.  Hospital-acquired pneumonia - patient already finished a course of Levaquin and  while she was in the hospital, due to worsening of infiltrate on CXR she was treated with IV cefepime and vancomycin., then transitiond to Po levaquin 11/11, she will continue this for 4 more days PO  2 . End stage renal disease - on HD TTS per renal  3 . Pneumothorax, traumatic - per chest x-ray the pneumothorax has improved   4. Pleuritic L sided chest pain due to L Rib fractures and pneumonia: improved  5 . DIABETES MELLITUS, TYPE II, continued on home medications and SSI   6 . L arm AVG with hematoma: had a thrombectomy last admission for clotted AVG, was seen by [facility] , s/p dressing by Dr.Dickson, graft used for HD per [facility], was able to tolerate HD, no further bleeding.  Dr.DIckson recommended FU in his office  7. Dementia: d/W PoA Orlie Dakin [name] she's working on getting her to [facility] eventually, patient and niece decline SNF since they are working on getting her to [place] soon, she will be sent home with home health services   FULL CODE:  Dispo: home with [facility] services today after HD    Time spent on Discharge: Signed: Sylvester Minton [facility]  07/05/2012, 11:48 AM

## 2012-07-05 NOTE — Progress Notes (Signed)
Occupational Therapy Treatment Patient Details Name: Claudia Rich MRN: [id number] DOB: May 16, 1927 Today's Date: 07/05/2012 Time: 4401-0272 OT Time Calculation (min): 17 min  OT Assessment / Plan / Recommendation Comments on Treatment Session Not really making progress today due to pt stating that she did not feel like trying to work with me. D/C order in chart, so called pt's home and niece-in-law states someone will be with her 24/7 (as long as this is true then home with HHOT is fine).    Follow Up Recommendations  Home health OT;Supervision/Assistance - 24 hour       Equipment Recommendations  None recommended by OT       Frequency Min 2X/week   Plan Discharge plan needs to be updated    Precautions / Restrictions Precautions Precautions: Fall Precaution Comments: L rib fxs Restrictions Weight Bearing Restrictions: No   Pertinent Vitals/Pain No c/o pain    ADL  ADL Comments: Pt very pleasant upon my entering room. I asked her to sit up on the side of the bed and she did so with increased time. Once EOB she said, "I just don't feel like getting up, I had dialysis earlier". I asked her to try and do a little with me and she said she just did not feel like it. While in room I called the pt's house to try and clarify  if she would have  24/7 S/A at home due to how she was yesterday with OT and  being really tired today after dialysis. Lily her niece-in law said that someone would be with her 24/7 at home and that the pt is generally worn out after dialysis and just comes home and sleeps.       OT Goals                                                         No goals addressed this session due to pt would only get up to the EOB with me. Did clarify family support at home since D/C order written    Visit Information  Last OT Received On: 07/05/12 Assistance Needed: +1    Subjective Data  Subjective: Called pt's home and her niece-in-law [employer]) reports that pt will have someone  with her 24/7.      Cognition  Overall Cognitive Status:  (History of cognitive impairments--question worse) Area of Impairment: Memory (asking her questions about dialysis when at home) Arousal/Alertness: Awake/alert (just tired) Behavior During Session:  (tired)    Mobility  Shoulder Instructions Bed Mobility Bed Mobility: Supine to Sit;Sit to Supine Supine to Sit: 5: Supervision;HOB flat;With rails Sit to Supine: HOB flat;5: Supervision;With rail Transfers Details for Transfer Assistance: Pt did not want to get up ("I just don't feel like it")             End of Session OT - End of Session Equipment Utilized During Treatment:  (None, only to EOB) Activity Tolerance: Patient limited by fatigue Patient left: in bed;with call [place]/phone within reach;with bed alarm set Nurse Communication:  (That pt's niece-in-law was wanting to know about D/C)       Evette Georges [phone] 07/05/2012, 3:03 PM

## 2012-07-06 ENCOUNTER — Telehealth: Payer: Self-pay | Admitting: Vascular Surgery

## 2012-07-06 NOTE — Telephone Encounter (Signed)
Mailbox is full, sent letter, dpm

## 2012-07-06 NOTE — Telephone Encounter (Signed)
Message copied by Fredrich Birks on Wed Jul 06, 2012 11:58 AM ------      Message from: Lamar Blinks S      Created: Wed Jul 06, 2012 11:32 AM      Regarding: FW: f/u list       Needs 2 wk. F/u appt. W/ CSD/ chk. Left axillary incision      ----- Message -----         From: Chuck Hint, MD         Sent: 07/05/2012   2:23 PM           To: Conley Simmonds Pullins, RN      Subject: f/u list                                                 She needs a f/u visit in 2 weeks to check on her left axillary incision. She can be in [  ] on the list.      Thanks      CSD

## 2012-07-12 ENCOUNTER — Inpatient Hospital Stay: Payer: Medicare HMO | Admitting: Family Medicine

## 2012-07-27 ENCOUNTER — Ambulatory Visit: Payer: Medicare HMO | Admitting: Vascular Surgery

## 2012-09-12 ENCOUNTER — Ambulatory Visit (INDEPENDENT_AMBULATORY_CARE_PROVIDER_SITE_OTHER): Payer: Medicare HMO | Admitting: Family Medicine

## 2012-09-12 ENCOUNTER — Encounter: Payer: Self-pay | Admitting: Family Medicine

## 2012-09-12 VITALS — BP 142/55 | HR 78 | Temp 98.2°F | Ht 63.0 in | Wt 159.6 lb

## 2012-09-12 DIAGNOSIS — J302 Other seasonal allergic rhinitis: Secondary | ICD-10-CM

## 2012-09-12 DIAGNOSIS — F0390 Unspecified dementia without behavioral disturbance: Secondary | ICD-10-CM

## 2012-09-12 DIAGNOSIS — I10 Essential (primary) hypertension: Secondary | ICD-10-CM

## 2012-09-12 DIAGNOSIS — N184 Chronic kidney disease, stage 4 (severe): Secondary | ICD-10-CM

## 2012-09-12 DIAGNOSIS — J309 Allergic rhinitis, unspecified: Secondary | ICD-10-CM

## 2012-09-12 DIAGNOSIS — T7840XA Allergy, unspecified, initial encounter: Secondary | ICD-10-CM

## 2012-09-12 MED ORDER — CETIRIZINE HCL 10 MG PO CAPS: 1.0000 | ORAL_CAPSULE | Freq: Every day | ORAL | Status: DC

## 2012-09-12 MED ORDER — GUAIFENESIN ER 600 MG PO TB12: 600.0000 mg | ORAL_TABLET | Freq: Two times a day (BID) | ORAL | Status: DC

## 2012-09-12 NOTE — Patient Instructions (Addendum)
It has been a pleasure to see you today. Please take the medications as prescribed. Make an  appointment with geriatric clinic for follow up memory problems. Please make a f/u appointment with me in 1 month or sooner if needed.

## 2012-09-13 DIAGNOSIS — T7840XA Allergy, unspecified, initial encounter: Secondary | ICD-10-CM | POA: Insufficient documentation

## 2012-09-13 NOTE — Assessment & Plan Note (Signed)
Lat A1C Nov/2013 7.0 at goal. Plan: Continue on Januvia and Glucotrol.

## 2012-09-13 NOTE — Assessment & Plan Note (Signed)
Symptoms more consistent with allergy. Cough could be residual of recent respiratory process. No signs of infectious etiology. Non productive, no rales, no wheezes. Plan: Cetirizine and guaifenesin F/u as needed.

## 2012-09-13 NOTE — Assessment & Plan Note (Addendum)
Controlled  Plan: Continue current plan (Lopressor and Amlodipine)

## 2012-09-13 NOTE — Progress Notes (Signed)
Family Medicine Office Visit Note   Subjective:   Patient ID: Claudia Rich, female  DOB: 07-09-1927, 79 y.o.. MRN: [id number]   Pt that comes today acompanied by her niece, Claudia Rich. The major concern today is pt's recent rib fractures and increase risks for falls. Pt lives alone, has nursing service that comes to her house. Niece (POA) lives in [place] and can not take care of pt 24/7. She has arrangement with friends for transportation to pt's to doctor's appointments and  Dialysis transportation has also being arranged. Claudia Rich is asking [place] to fill out nursing care form to increase hours of service due to above.   Niece also comment that she is concerned about increase of memory problems pt has had recently. Pt agrees with some loss of anterograde memory.   Pt complaints today of cough. She has had this after her PNA. She also reports having increased runny nose, "watery eyes" and sensation of sinus congestion. Denies sore throat, headaches, fever or chills.   Review of Systems:  Pt denies SOB, chest pain, palpitations, headaches, dizziness, numbness or weakness. No changes on urinary or BM habits. No unintentional weigh loss/gain. Denies hallucinations, agitation, or sad thoughts.   Objective:   Physical Exam: Gen:  Very pleasant pt, NAD HEENT: Moist mucous membranes  CV: Regular rate and rhythm, no murmurs rubs or gallop PULM: Clear to auscultation bilaterally. No wheezes/rales/rhonchi ABD: Soft, non tender, non distended, normal bowel sounds EXT: No edema. AVF on left Upper arm with thrill to palpation.  Neuro: No focalization. No tremors. Psych: Pt oriented on person and place, not on date. Normal affect and mood.   Assessment & Plan:

## 2012-09-13 NOTE — Assessment & Plan Note (Signed)
Increased of memory loss. Not enough time today to evaluatet MMSE Plan: Consult in Geriatric clinic for more detailed assessment.

## 2012-09-15 ENCOUNTER — Telehealth: Payer: Self-pay | Admitting: *Deleted

## 2012-09-15 NOTE — Telephone Encounter (Signed)
Claudia Rich with [facility] [employer] calling.  They received a call from Claudia Rich niece requesting some help with filling her pill box.  Claudia Rich is requesting verbal or written order for nurse evaluation for pill box prefills.  Also requesting copy of patient's medication and problem list.  Verbal okay called to Claudia Rich.  Medication and problem list faxed to [phone].  Ileana Ladd

## 2012-09-16 ENCOUNTER — Telehealth: Payer: Self-pay | Admitting: Family Medicine

## 2012-09-16 NOTE — Telephone Encounter (Signed)
I think is appropriate for the pt to have this service. Please advice in how to proceed with this order.

## 2012-09-16 NOTE — Telephone Encounter (Signed)
Spoke with a nurse from [facility].  They do offer this service if the patient had Medicaid and if there is a diagnosis supporting the need for nursing management.  Will route this note to her PCP to make this decision.  If she agrees will ask that she write an order and we will process.  Told patient's niece that someone would call her back to let her know the outcome.

## 2012-09-16 NOTE — Telephone Encounter (Signed)
According to niece, she received a phone call from the HD stating that patient did not quality for this med service b/c she has Medicaid.  Told niece that I would have to check with [facility] to see if they provide this service.  I will call her back after I investigate.

## 2012-09-16 NOTE — Telephone Encounter (Signed)
Ms. Marvis Moeller, Mrs.Mechling's niece calling to requesting referral to [facility] for medicine management services for her aunt.  You can fax that info to [phone].  If there are any concerns regarding this request please contact Ms. Marvis Moeller.

## 2012-09-22 ENCOUNTER — Telehealth: Payer: Self-pay | Admitting: Family Medicine

## 2012-09-22 NOTE — Telephone Encounter (Addendum)
Daughter is calling again asking about [facility] coming to see patient to fill pill box.  They told her that she might not qualify is she is not home bound.  Would like for nurse to call her to let her know what the status is since she has not heard anything yet.   Please advise.

## 2012-09-22 NOTE — Telephone Encounter (Signed)
Patient is calling to check the determination of the referral for Home Care.  I spoke with Dennison Nancy, RN and she said that it was agreed that she does need Home Care and that Dr. Aviva Signs needs to move forward with writing the order and it being faxed to [facility].  I let the patient know that and she voiced understanding.

## 2012-09-22 NOTE — Telephone Encounter (Signed)
Just write a script with the order; nurse visits ____ times a week for medication management, diabetes care and safety monitoring.  Put it on my desk and I will fax it.

## 2012-09-23 NOTE — Telephone Encounter (Signed)
Script written and faxed to [facility].  Left VM message with niece that script has been faxed.

## 2012-09-28 ENCOUNTER — Telehealth: Payer: Self-pay | Admitting: Family Medicine

## 2012-09-28 NOTE — Telephone Encounter (Signed)
Nurse is calling because the patient missed her medication from her pill box.  The medication was in the pill box, but she didn't take it.  Patient is very forgetful so the nurse isn't sure she needs to be in the home alone.  For example, there is a big sign in the house that says "Do Not Forget To [facility] The Door".

## 2012-09-28 NOTE — Telephone Encounter (Signed)
Patient needs a new order to [facility] stating that in order for [facility] to continuously fill her pill box indefinetly because the last order will end on 2/19.

## 2012-09-29 NOTE — Telephone Encounter (Signed)
Will forward to Dr Aviva Signs

## 2012-09-29 NOTE — Telephone Encounter (Signed)
Will forward to Dr Piloto 

## 2012-09-29 NOTE — Telephone Encounter (Signed)
Faxed out to [facility]

## 2012-09-29 NOTE — Telephone Encounter (Signed)
Family member is calling to check on the status of the order for patients pill box.  Also, the form that was completed last week was incomplete so she needs to fax the form back and there will be check marks next the questions that were missed.  She really needs this faxed back by tomorrow.

## 2012-10-04 NOTE — Telephone Encounter (Signed)
Patient needs an order faxed to [facility] to extend the service for the patients pill box because right now it is only approved through 2/19 and it needs to be indefinetly.

## 2012-10-05 NOTE — Telephone Encounter (Signed)
Will fill out form

## 2012-10-12 NOTE — Telephone Encounter (Signed)
Called back to let doctor know that [facility] has not gotten orders for her and today is last day for this - needs this to fill her pills each week - needs to know something today - this needs to be indefinately

## 2012-10-13 NOTE — Telephone Encounter (Signed)
Is calling again about these orders for [facility] - needs to know if this has been taken care of - pls advise

## 2012-10-13 NOTE — Telephone Encounter (Addendum)
nurse Zella Ball Myrick) from [facility] called to say that when she went out yesterday, she is not taking her meds correctly (wrong days) they are requesting a consults for SW to go see her [phone] cell

## 2012-10-14 ENCOUNTER — Telehealth: Payer: Self-pay | Admitting: Family Medicine

## 2012-10-14 NOTE — Telephone Encounter (Signed)
Called Nurse Robin Myrick [phone] and discuss the needs of Claudia Rich of being evaluated by SW. I gave verbal order and pt will be assessed by SW through [facility]. Also I will fax attached to the filled form for [facility] a summary of pt's most current medication regimen we have discussed.

## 2012-10-14 NOTE — Telephone Encounter (Signed)
Communication sent to our CSW to help with this matter. Home health Certification and Plan of Care forms updated and signed.

## 2012-11-23 ENCOUNTER — Telehealth: Payer: Self-pay | Admitting: Family Medicine

## 2012-11-23 NOTE — Telephone Encounter (Signed)
Needs verbal to recertify to go out to fill pill bottle each week.

## 2012-11-24 NOTE — Telephone Encounter (Signed)
Spoke with scott and gave verbal orders

## 2012-12-06 ENCOUNTER — Other Ambulatory Visit: Payer: Self-pay | Admitting: *Deleted

## 2012-12-07 NOTE — Telephone Encounter (Signed)
Pt receives this medication through her nephrologist.

## 2013-01-09 ENCOUNTER — Ambulatory Visit
Admission: RE | Admit: 2013-01-09 | Discharge: 2013-01-09 | Disposition: A | Discharge status: Home / Self Care | Payer: Medicare HMO | Source: Ambulatory Visit | Attending: Family Medicine | Admitting: Family Medicine

## 2013-01-09 ENCOUNTER — Encounter: Payer: Self-pay | Admitting: Family Medicine

## 2013-01-09 ENCOUNTER — Ambulatory Visit (INDEPENDENT_AMBULATORY_CARE_PROVIDER_SITE_OTHER): Payer: Medicare HMO | Admitting: Family Medicine

## 2013-01-09 VITALS — BP 129/63 | HR 73 | Temp 98.2°F | Ht 63.0 in | Wt 154.0 lb

## 2013-01-09 DIAGNOSIS — R059 Cough, unspecified: Secondary | ICD-10-CM

## 2013-01-09 DIAGNOSIS — R05 Cough: Secondary | ICD-10-CM

## 2013-01-09 DIAGNOSIS — R112 Nausea with vomiting, unspecified: Secondary | ICD-10-CM | POA: Insufficient documentation

## 2013-01-09 DIAGNOSIS — J42 Unspecified chronic bronchitis: Secondary | ICD-10-CM

## 2013-01-09 MED ORDER — PREDNISONE 50 MG PO TABS: ORAL_TABLET | ORAL | Status: DC

## 2013-01-09 MED ORDER — LEVOFLOXACIN 250 MG PO TABS: ORAL_TABLET | ORAL | Status: DC

## 2013-01-09 NOTE — Patient Instructions (Addendum)
Will get a chest xray and call results to your niece at [phone]  Take Prednisone once a day for 5 days  Schedule follow-up with your primary doctor for 2-3 weeks

## 2013-01-09 NOTE — Assessment & Plan Note (Addendum)
Chronic cough with no improvement on empiric mucinex, rantidine, cetirizne.  No evidence of heart failure.  Will obtain CXR for further evaluation  CXR today shows continued scarring in both bases, bronchiectsasis, tiny left base effusion" improved from hospitalization in November.  Reviewed CT from November- no mass, was read as bibasilar atelectasis/chronic bronchitis.  likely bronchiectasis vs chronic bronchitis.  will treat with 5 day course of prednisone and renally dosed Levaquin given purulent sputum reported.  May continue prn use of albuterol.  Will return in 2-3 weeks to discuss with PCP symptoms for need for further evaluation based on symptoms.

## 2013-01-09 NOTE — Assessment & Plan Note (Signed)
Isolated temporally to dialysis only = ? Disequilibrium syndrome.  Advised to discuss with her providers who are able to witness and evaluate as this happens during dialysis.

## 2013-01-09 NOTE — Progress Notes (Signed)
  Subjective:    Patient ID: Claudia Rich, female    DOB: 1926/10/12, 79 y.o.   MRN: [id number]  HPI Work in appt for cough and emesis  Brought by deacon.  Spoke with her niece who is her HCPOA at patient's request as niece helps to coorduiante her care.  History gathered from patient and niece.  Cough:  Chronic cough- started as long as a year ago.  Was pre-existing early November 2013 when she was admitted for pneumothorax/pneumonia after a fall causing rib fractures.  No dyspnea or PND.  At times cough has sputum.  Sometimes clear, sometimes with color.  No fever or chills.  No chest pain.  Was prescribed mucinex at last appt with PCP which helps some.  Takes ranitidine and cetrizine daily.  Never smoker, does use albuterol empirically ? Patient reports using it for cough not dyspnea with some improvement.    Emesis:  For past several weeks, at least once per week during or immediately after dialysis, has one episode of emesis.  No fever.  No diarrhea.  Self limited.  Has not been evaluated by renal.  Review of Systems See HPI    Objective:   Physical Exam GEN: Alert & Oriented, No acute distress HEENT: .  Nares without edema or rhinorrhea.  Oropharynx is without erythema or exudates.  No anterior or posterior cervical lymphadenopathy. CV:  Regular Rate & Rhythm, no murmur Respiratory:  Normal work of breathing, CTAB Abd:  + BS, soft, no tenderness to palpation Ext: no pre-tibial edema        Assessment & Plan:

## 2013-01-20 ENCOUNTER — Telehealth: Payer: Self-pay | Admitting: Family Medicine

## 2013-01-20 NOTE — Telephone Encounter (Signed)
Gave verbal orders to continue [facility].  Darrol Brandenburg, [name], CMA

## 2013-01-20 NOTE — Telephone Encounter (Signed)
[facility] is calling for order for recertification for weekly med setter.  Verbal orders are ok.

## 2013-01-23 ENCOUNTER — Encounter: Payer: Self-pay | Admitting: Family Medicine

## 2013-01-23 ENCOUNTER — Ambulatory Visit (INDEPENDENT_AMBULATORY_CARE_PROVIDER_SITE_OTHER): Payer: Medicare HMO | Admitting: Family Medicine

## 2013-01-23 VITALS — BP 125/52 | HR 76 | Temp 98.6°F | Ht 63.0 in | Wt 155.4 lb

## 2013-01-23 DIAGNOSIS — J449 Chronic obstructive pulmonary disease, unspecified: Secondary | ICD-10-CM

## 2013-01-23 DIAGNOSIS — J984 Other disorders of lung: Secondary | ICD-10-CM | POA: Insufficient documentation

## 2013-01-23 DIAGNOSIS — K862 Cyst of pancreas: Secondary | ICD-10-CM | POA: Insufficient documentation

## 2013-01-23 DIAGNOSIS — R05 Cough: Secondary | ICD-10-CM

## 2013-01-23 DIAGNOSIS — J4489 Other specified chronic obstructive pulmonary disease: Secondary | ICD-10-CM

## 2013-01-23 DIAGNOSIS — IMO0001 Reserved for inherently not codable concepts without codable children: Secondary | ICD-10-CM

## 2013-01-23 DIAGNOSIS — R059 Cough, unspecified: Secondary | ICD-10-CM

## 2013-01-23 LAB — POCT GLYCOSYLATED HEMOGLOBIN (HGB A1C): Hemoglobin A1C: 7.3

## 2013-01-23 MED ORDER — PREDNISONE 50 MG PO TABS: ORAL_TABLET | ORAL | Status: DC

## 2013-01-23 MED ORDER — ALBUTEROL SULFATE (2.5 MG/3ML) 0.083% IN NEBU
2.5000 mg | INHALATION_SOLUTION | Freq: Once | RESPIRATORY_TRACT | Status: AC
  Administered 2013-01-23: 2.5 mg via RESPIRATORY_TRACT

## 2013-01-23 MED ORDER — TIOTROPIUM BROMIDE MONOHYDRATE 18 MCG IN CAPS: 18.0000 ug | ORAL_CAPSULE | Freq: Every day | RESPIRATORY_TRACT | Status: DC

## 2013-01-23 MED ORDER — IPRATROPIUM BROMIDE 0.02 % IN SOLN
0.5000 mg | Freq: Once | RESPIRATORY_TRACT | Status: AC
  Administered 2013-01-23: 0.5 mg via RESPIRATORY_TRACT

## 2013-01-23 NOTE — Progress Notes (Signed)
Subjective:    Claudia Rich is a 79 y.o. female who presents to [facility] today for FU from visit for cough 2 weeks ago:  1.  Cough:  Present for almost a year.  Occasionally productive.  Has tried multiple other treatments for this.  Started on Steroids/Levaquin last OV.  Has had moderate improvement with these medications.  Uses Albuterol inhaler 3-4 times daily everyday and has been doing so for weeks if not months.  States this helps with subjective dyspnea as well as "coughing spells."  Denies any weight loss, hemoptysis, night sweats.    CT scan of chest Nov 2013 did not show any pulm nodules.  Did show bronchiectasis vs scarring as well as rib fractures. Repeat CXR last OV showed old fx's still present.  Also still with bronchiectasis versus scaring and very minimal pulmonary edema LLL.     The following portions of the patient's history were reviewed and updated as appropriate: allergies, current medications, past medical history, family and social history, and problem list. Patient is a nonsmoker.    PMH reviewed.  Past Medical History  Diagnosis Date  . Hypertension   . Diabetes mellitus   . Hyperlipidemia   . GERD (gastroesophageal reflux disease)   . Glaucoma   . Hammer toe   . Internal hemorrhoids   . Diverticulosis   . Colon polyps   . Peripheral vascular disease   . Anemia   . COPD (chronic obstructive pulmonary disease)   . Stroke 10/2010    Has some memory impairment per DTR  . Myocardial infarction 10/2010  . ESRD on hemodialysis     TTS at [facility]. Started HD    Past Surgical History  Procedure Laterality Date  . Av fistula repair  02/16/2011    Revision of left brachiocephalic arteriovenous fistula with patch angioplasty of left brachiocephalic fistula  . Av fistula placement, brachiocephalic  09/24/2010    Left  . Av fistula placement    . Cataract extraction    . Abdominal hysterectomy    . Av fistula placement  08/14/2011    Procedure: ARTERIOVENOUS (AV)  FISTULA CREATION;  Surgeon: Chuck Hint, MD;  Location: [facility] OR;  Service: Vascular;  Laterality: Right;  Right Arm Arteriovenous Fistula Creation/ contact pt's niece,[name] for any information to be relayed to pt: # [phone]  . Insertion of dialysis catheter  09/28/2011    Procedure: INSERTION OF DIALYSIS CATHETER;  Surgeon: Sherren Kerns, MD;  Location: [facility] OR;  Service: Vascular;  Laterality: Left;  Insertion of Dialysis Catheter left IJ  . Eye surgery    . Av fistula placement  12/07/2011    Procedure: INSERTION OF ARTERIOVENOUS (AV) GORE-TEX GRAFT ARM;  Surgeon: Sherren Kerns, MD;  Location: [facility] OR;  Service: Vascular;  Laterality: Left;  . Thrombectomy and revision of arterioventous (av) goretex  graft  06/28/2012    Procedure: THROMBECTOMY AND REVISION OF ARTERIOVENTOUS (AV) GORETEX  GRAFT;  Surgeon: Chuck Hint, MD;  Location: [facility] OR;  Service: Vascular;  Laterality: Left;    Medications reviewed. Current Outpatient Prescriptions  Medication Sig Dispense Refill  . acetaminophen (TYLENOL) 500 MG tablet Take 1 tablet (500 mg total) by mouth every 8 (eight) hours. Take for 5 days then use as needed.  30 tablet  0  . amLODipine (NORVASC) 2.5 MG tablet Take 1 tablet (2.5 mg total) by mouth daily.  31 tablet  0  . aspirin 81 MG chewable tablet Chew 81 mg by  mouth daily.      Marland Kitchen atorvastatin (LIPITOR) 80 MG tablet Take 1 tablet (80 mg total) by mouth daily.  30 tablet  6  . B Complex-C-Folic Acid (RENAL MULTIVITAMIN FORMULA PO) Take 1 tablet by mouth daily.       . calcium acetate (PHOSLO) 667 MG capsule Take 667 mg by mouth 2 (two) times daily.        . Cetirizine HCl 10 MG CAPS Take 1 capsule (10 mg total) by mouth daily.  30 capsule  6  . glipiZIDE (GLUCOTROL) 10 MG tablet Take 2 tablets (20 mg total) by mouth 2 (two) times daily before a meal.  120 tablet  6  . guaiFENesin (MUCINEX) 600 MG 12 hr tablet Take 1 tablet (600 mg total) by mouth 2 (two) times daily.  60  tablet  0  . levofloxacin (LEVAQUIN) 250 MG tablet 2 tabs on day 1, 1 tab on days 3,5,7  5 tablet  0  . metoprolol tartrate (LOPRESSOR) 25 MG tablet TAKE ONE TABLET TWICE DAILY FOR BLOOD   PRESSURE                                                  GENERIC FOR LOPRESSO  60 tablet  11  . predniSONE (DELTASONE) 50 MG tablet One tablet a day for 5 days  5 tablet  0  . ranitidine (ZANTAC) 150 MG tablet Take 1 tablet (150 mg total) by mouth 2 (two) times daily.  60 tablet  6  . sitaGLIPtin (JANUVIA) 25 MG tablet Take 1 tablet (25 mg total) by mouth daily.  30 tablet  6  . VENTOLIN HFA 108 (90 BASE) MCG/ACT inhaler INHALE AS NEEDED FOR WHEEZING EVERY 4   HOURS  36 g  3   No current facility-administered medications for this visit.    ROS as above otherwise neg.  No chest pain, palpitations, SOB, Fever, Chills, Abd pain, N/V/D.   Objective:   Physical Exam BP 125/52  Pulse 76  Temp(Src) 98.6 F (37 C) (Oral)  Ht 5\' 3"  (1.6 m)  Wt 155 lb 6.4 oz (70.489 kg)  BMI 27.53 kg/m2 Gen:  Alert, cooperative patient who appears stated age in no acute distress.  Vital signs reviewed.  She has difficulty with ambulation to the exam table from the chair, states this is due to shortness of breath.  Balance does not seem to be affected. Marland Kitchen  HEENT: EOMI.  Anisocoria noted BL (oval shaped pupils) which she tells me is her baseline.  Arcus present BL.  MMM, with upper and lower dentures in place. Neck:  Trachea midline.  No JVD Cardiac:  Regular rate and rhythm  Pulm:  Good air movement.  Rhonchi throughout, bilaterally, worse at bases. No crackles.  Some scattered wheezes as well.  No focal areas of consolidation. Abd:  Soft/nondistended/nontender.   Exts: Non edematous BL  LE, warm and well perfused.   Pulm Exam s/p nebulizer treatment:  Actually good improvement with aeration, decreased wheezing.  Still with rhonchi at bases but not present rest of lung exam.    Results for orders placed in visit on 01/23/13  (from the past 72 hour(s))  POCT GLYCOSYLATED HEMOGLOBIN (HGB A1C)     Status: None   Collection Time    01/23/13 10:49 AM      Result  Value Range   Hemoglobin A1C 7.3

## 2013-01-23 NOTE — Patient Instructions (Addendum)
I am starting you on a medicine called Spiriva.  This is a new inhaler that you should use everyday.  We also did a breathing treatment to help you today.    I am also sending in another 5 days of Prednisone to help with your lungs.  Come back in about 2 weeks so we can make sure that your lungs are improving.

## 2013-01-23 NOTE — Assessment & Plan Note (Addendum)
I have gone through the patien'ts past xrays.   Breathing treatment here in clinic - she was moderately improved afterwards.   She has stigmata of COPD ("twin towers" appearing lungs, scarring noted) based on x-rays and history. She is not on any long-term inhalers.  Plan another course of Prednisone for 5 days.  Wait for her home health nurse, who is coming on Friday, before starting Spriva.   Prescription for Spiriva send today.   Diagnosing her as COPD today -- will have her FU in 2 weeks for reassessment.

## 2013-01-30 ENCOUNTER — Telehealth: Payer: Self-pay | Admitting: Family Medicine

## 2013-01-30 NOTE — Telephone Encounter (Signed)
Correction Blood Sugars were 576.

## 2013-01-30 NOTE — Telephone Encounter (Signed)
Blood Sugars are elvevated at 5756 x 2.  Patient is a dialysis, T,Th,Sa and is on Sterioids (Prednisone).  The daughter is managing the patient from afar.  The daughter is [name] and her # is [phone].  She uses [employer].

## 2013-01-30 NOTE — Telephone Encounter (Signed)
Attempted to reach [place] with [facility], LMOVM.  Will fwd to MD for review while waiting to here back from [place].  Siearra Amberg, [name], CMA

## 2013-01-31 NOTE — Telephone Encounter (Signed)
Spoke with patient's daughter, [name] [phone]).  She wanted to report that patient's CBG has been running high, yesterday a reading of 576, since she has been on the prednisone.  Daughter is aware that prednisone may increase her glucose.  Mother has been feeling ok, c/o feeling a "little loopy" yesterday.  Instructed daughter to monitor glucose and to report back if readings continue to be elevated.  Today is the last day of her 10 day course of Prednisone 50mg  a day. Will forward to Dr. Gwendolyn Grant who saw patient on 01/23/2013 and PCP.    Blimie Vaness, [name], CMA

## 2013-02-01 NOTE — Telephone Encounter (Signed)
I'm not in town and am "Out" based on my Epic box, so I'm not sure why this is coming to me.  Prednisone should make the blood sugars run high but not >500 high, so she should be seen.  It would be best if she could be seen by her PCP, or anyone on [facility] Team who is available, especially since I'm not in [place] and won't be back until Monday.

## 2013-02-02 NOTE — Telephone Encounter (Signed)
Followed up with patient's daughter.  On Tuesday her CBG was 300 at dialysis and on Wednesday it was 147.  Pt is feeling much better and her CBG continues to fall back in it's "normal" range, reports patient's daughter,Claudia Rich.  [name], [name], CMA

## 2013-02-08 ENCOUNTER — Encounter: Payer: Self-pay | Admitting: Family Medicine

## 2013-02-08 ENCOUNTER — Ambulatory Visit (INDEPENDENT_AMBULATORY_CARE_PROVIDER_SITE_OTHER): Payer: Medicare HMO | Admitting: Family Medicine

## 2013-02-08 VITALS — BP 138/52 | HR 81 | Temp 98.7°F | Ht 63.0 in | Wt 155.0 lb

## 2013-02-08 DIAGNOSIS — IMO0001 Reserved for inherently not codable concepts without codable children: Secondary | ICD-10-CM

## 2013-02-08 DIAGNOSIS — J449 Chronic obstructive pulmonary disease, unspecified: Secondary | ICD-10-CM

## 2013-02-08 DIAGNOSIS — I10 Essential (primary) hypertension: Secondary | ICD-10-CM

## 2013-02-08 LAB — CBC
HCT: 29.9 % — ABNORMAL LOW (ref 36.0–46.0)
Hemoglobin: 9.8 g/dL — ABNORMAL LOW (ref 12.0–15.0)
RBC: 3.02 MIL/uL — ABNORMAL LOW (ref 3.87–5.11)
WBC: 7.2 10*3/uL (ref 4.0–10.5)

## 2013-02-08 NOTE — Assessment & Plan Note (Signed)
Controlled. P/ continue current regimen. Cbc. Cmet, lipid profile.

## 2013-02-08 NOTE — Patient Instructions (Addendum)
It has been a pleasure to see you today. Please take the medications as prescribed. Continue taking Spiriva. Make a lab appointment. This needs to be fasting in the morning. I will call you if results needs to be addressed sooner if not we will discuss them at your next apptm. Make your next appointment in 3-4 months.

## 2013-02-08 NOTE — Assessment & Plan Note (Addendum)
A1C on 01/23/2013 was 7.3. Prior was 7.0 P/ Continue with same medication regimen. Discussed diabetic diet. Pt reports she has not been compliant with it. Risk of hypoglycemic episodes outweight more strict goal.  F/u in 3-4 months.

## 2013-02-08 NOTE — Progress Notes (Signed)
Family Medicine Office Visit Note   Subjective:   Patient ID: Claudia Rich, female  DOB: 1926-09-30, 79 y.o.. MRN: [id number]   Pt that comes today for follow up. She was seen last 01/23/13 with COPD exacerbation. She reports feeling better and decreased amount of cough.  She is on Dialysis TTS and reports compliance.  Dementia: Difficult short term memory but oriented X 3. Has difficulty with medication management but [facility] nurse helps her with this task.   Review of Systems:  Pt denies SOB, chest pain, palpitations, headaches, dizziness, numbness or weakness. No changes on urinary or BM habits. No unintentional weigh loss/gain.  Objective:   Physical Exam: Gen:  NAD HEENT: Moist mucous membranes  CV: Regular rate and rhythm, no murmurs rubs or gallops PULM: Clear to auscultation bilaterally. No wheezes/rales/rhonchi ABD: Soft, non tender, non distended, normal bowel sounds EXT: No edema Neuro: Alert and oriented x3. No focalization  Assessment & Plan:

## 2013-02-09 ENCOUNTER — Telehealth: Payer: Self-pay | Admitting: Family Medicine

## 2013-02-09 LAB — COMPREHENSIVE METABOLIC PANEL
BUN: 25 mg/dL — ABNORMAL HIGH (ref 6–23)
CO2: 28 mEq/L (ref 19–32)
Calcium: 8.9 mg/dL (ref 8.4–10.5)
Chloride: 103 mEq/L (ref 96–112)
Creat: 6.19 mg/dL — ABNORMAL HIGH (ref 0.50–1.10)
Glucose, Bld: 246 mg/dL — ABNORMAL HIGH (ref 70–99)

## 2013-02-09 NOTE — Telephone Encounter (Signed)
POA [name] called wanting to talk with Dr. Aviva Signs about patient's appt yesterday. Has a few concerns and would like to speak with MD about them. Pls call [phone].

## 2013-02-09 NOTE — Telephone Encounter (Signed)
[name], [place], wants to know if patient may take Aleve for the occasional discomfort patient has in the area of a past rib fracture.  Will fwd to MD for advice.  Guiliana Shor, [name], CMA

## 2013-02-09 NOTE — Telephone Encounter (Signed)
[name] notified ok for Aleve.  Quiara Killian, [name], CMA

## 2013-02-09 NOTE — Assessment & Plan Note (Signed)
Exacerbation has resolved. Pt ended treatment with prednisone. Pt taking Spiriva and no side effects noted. P/ Continue with Spiriva daily and albuterol PRN.

## 2013-02-09 NOTE — Telephone Encounter (Signed)
Yes. We discussed it with pt yesterday and precepted with Dr. Mahala Menghini. She can take Aleve (PRN) for her pain. Pt is already on Dialysis (her kidneys don't work)

## 2013-02-13 ENCOUNTER — Other Ambulatory Visit: Payer: Medicare HMO

## 2013-02-13 NOTE — Progress Notes (Signed)
Pt came for fasting labs, no orders, call to MD.  Pt has had 2 lipid profile in past year; will not draw another per MD. Dewitt Hoes, MLS

## 2013-02-28 ENCOUNTER — Other Ambulatory Visit: Payer: Self-pay | Admitting: Family Medicine

## 2013-03-02 ENCOUNTER — Other Ambulatory Visit: Payer: Self-pay

## 2013-03-23 ENCOUNTER — Telehealth: Payer: Self-pay | Admitting: *Deleted

## 2013-03-23 NOTE — Telephone Encounter (Signed)
Fax placed in your box for diabetes supplies - please complete and fax to number listed. Wyatt Haste, RN-BSN

## 2013-03-27 ENCOUNTER — Telehealth: Payer: Self-pay | Admitting: Family Medicine

## 2013-03-27 NOTE — Telephone Encounter (Signed)
Consuella Lose called about Claudia Rich, she has an out of state power of attorney and the pt needs test strips and also Consuella Lose needs re-cert orders so she can still visit. She left her phone number if you have any questions [phone]. JW

## 2013-03-27 NOTE — Telephone Encounter (Signed)
Attempted to return call.  Unable to leave message.  Kaylee Trivett, [name], CMA

## 2013-03-28 ENCOUNTER — Telehealth: Payer: Self-pay | Admitting: Family Medicine

## 2013-03-28 NOTE — Telephone Encounter (Signed)
Pt requesting RF on her text strips sent to pharmacy on file. Also, Consuella Lose is with [facility] that comes once a week to set up Ms.Bells medicine for the week due to pt's decreased memory.  She needs orders to re certify the patient for continued services.  Will fwd to MD for refill and approval for [facility] renewal.  Marino Rogerson, [name], CMA

## 2013-03-28 NOTE — Telephone Encounter (Signed)
Pt is checking the status of the forms let in Dr. Willaim Rayas box. Where they faxed out and if not please do so because Camary needs her supplies. JW

## 2013-03-28 NOTE — Telephone Encounter (Signed)
Will fwd msg to Md.  Pt needs her glucose testing supplies refilled.   Alishia Lebo, [name], CMA

## 2013-03-30 NOTE — Telephone Encounter (Signed)
Spoke with [employer].  They havent received our fax for test strips.  Asked them to resend via fax, to my attn, and I will make sure that I have Dr. Aviva Signs sign today.   Delsy Etzkorn, [name], CMA

## 2013-03-30 NOTE — Telephone Encounter (Signed)
The patients niece is calling to check the status of the refill on her testing supplies.  The patient is out of her testing supplies.  The daughter said that she was out the other day when she called so this is an urgent need and she doesn't understand what the problem is.  She has been out for 2 weeks now.  The supply company is [employer] [phone].  Glucose strips for the Easy [employer] Machine.  The niece would like a call to be sure this is all resolved.

## 2013-03-30 NOTE — Telephone Encounter (Signed)
Forms received and placed on Dr. Willaim Rayas desk with note to please return to me when signed.  Claudia Rich, [name], CMA

## 2013-03-30 NOTE — Telephone Encounter (Signed)
[employer] called and will fax needed paperwork to be filled out for supplies - [facility] referral completed for pt as it looks like she needs more help than weekly meds - ALSO NEEDS [employer] [facility] FOR [facility] - Bosie Helper is aides name [phone]. I will place script in box as soon as it arrives. Wyatt Haste, RN-BSN

## 2013-03-31 ENCOUNTER — Telehealth: Payer: Self-pay | Admitting: Family Medicine

## 2013-03-31 NOTE — Telephone Encounter (Signed)
Form for diabetic supplies signed by Dr. Mauricio Po due to [facility].  Faxed form to [employer].  Niece informed.  Gaylene Brooks, RN

## 2013-03-31 NOTE — Telephone Encounter (Signed)
Form for diabetic supplies signed by Dr. Mauricio Po due to [facility].  Faxed form to [employer]. Patient's niece informed.   Gaylene Brooks, RN

## 2013-04-01 ENCOUNTER — Encounter (HOSPITAL_COMMUNITY): Payer: Self-pay | Admitting: *Deleted

## 2013-04-01 ENCOUNTER — Emergency Department (HOSPITAL_COMMUNITY): Payer: Medicare HMO

## 2013-04-01 ENCOUNTER — Emergency Department (HOSPITAL_COMMUNITY)
Admission: EM | Admit: 2013-04-01 | Discharge: 2013-04-01 | Disposition: A | Discharge status: Home / Self Care | Payer: Medicare HMO | Attending: Emergency Medicine | Admitting: Emergency Medicine

## 2013-04-01 DIAGNOSIS — D649 Anemia, unspecified: Secondary | ICD-10-CM | POA: Insufficient documentation

## 2013-04-01 DIAGNOSIS — Z8673 Personal history of transient ischemic attack (TIA), and cerebral infarction without residual deficits: Secondary | ICD-10-CM | POA: Insufficient documentation

## 2013-04-01 DIAGNOSIS — R0789 Other chest pain: Secondary | ICD-10-CM

## 2013-04-01 DIAGNOSIS — Z8781 Personal history of (healed) traumatic fracture: Secondary | ICD-10-CM | POA: Insufficient documentation

## 2013-04-01 DIAGNOSIS — Z8669 Personal history of other diseases of the nervous system and sense organs: Secondary | ICD-10-CM | POA: Insufficient documentation

## 2013-04-01 DIAGNOSIS — R071 Chest pain on breathing: Secondary | ICD-10-CM | POA: Insufficient documentation

## 2013-04-01 DIAGNOSIS — J449 Chronic obstructive pulmonary disease, unspecified: Secondary | ICD-10-CM | POA: Insufficient documentation

## 2013-04-01 DIAGNOSIS — I129 Hypertensive chronic kidney disease with stage 1 through stage 4 chronic kidney disease, or unspecified chronic kidney disease: Secondary | ICD-10-CM | POA: Insufficient documentation

## 2013-04-01 DIAGNOSIS — Z860100 Personal history of colon polyps, unspecified: Secondary | ICD-10-CM | POA: Insufficient documentation

## 2013-04-01 DIAGNOSIS — I252 Old myocardial infarction: Secondary | ICD-10-CM | POA: Insufficient documentation

## 2013-04-01 DIAGNOSIS — Z992 Dependence on renal dialysis: Secondary | ICD-10-CM | POA: Insufficient documentation

## 2013-04-01 DIAGNOSIS — Z79899 Other long term (current) drug therapy: Secondary | ICD-10-CM | POA: Insufficient documentation

## 2013-04-01 DIAGNOSIS — N186 End stage renal disease: Secondary | ICD-10-CM | POA: Insufficient documentation

## 2013-04-01 DIAGNOSIS — Z8739 Personal history of other diseases of the musculoskeletal system and connective tissue: Secondary | ICD-10-CM | POA: Insufficient documentation

## 2013-04-01 DIAGNOSIS — Z8719 Personal history of other diseases of the digestive system: Secondary | ICD-10-CM | POA: Insufficient documentation

## 2013-04-01 DIAGNOSIS — Z7982 Long term (current) use of aspirin: Secondary | ICD-10-CM | POA: Insufficient documentation

## 2013-04-01 DIAGNOSIS — K219 Gastro-esophageal reflux disease without esophagitis: Secondary | ICD-10-CM | POA: Insufficient documentation

## 2013-04-01 DIAGNOSIS — E119 Type 2 diabetes mellitus without complications: Secondary | ICD-10-CM | POA: Insufficient documentation

## 2013-04-01 DIAGNOSIS — E785 Hyperlipidemia, unspecified: Secondary | ICD-10-CM | POA: Insufficient documentation

## 2013-04-01 DIAGNOSIS — Z8679 Personal history of other diseases of the circulatory system: Secondary | ICD-10-CM | POA: Insufficient documentation

## 2013-04-01 DIAGNOSIS — J4489 Other specified chronic obstructive pulmonary disease: Secondary | ICD-10-CM | POA: Insufficient documentation

## 2013-04-01 LAB — BASIC METABOLIC PANEL
BUN: 29 mg/dL — ABNORMAL HIGH (ref 6–23)
Chloride: 97 mEq/L (ref 96–112)
Creatinine, Ser: 7.69 mg/dL — ABNORMAL HIGH (ref 0.50–1.10)
GFR calc Af Amer: 5 mL/min — ABNORMAL LOW (ref >90)
GFR calc non Af Amer: 4 mL/min — ABNORMAL LOW (ref >90)
Potassium: 3.5 mEq/L (ref 3.5–5.1)

## 2013-04-01 MED ORDER — ONDANSETRON HCL 4 MG PO TABS
4.0000 mg | ORAL_TABLET | Freq: Once | ORAL | Status: AC
  Administered 2013-04-01: 4 mg via ORAL
  Filled 2013-04-01: qty 1

## 2013-04-01 NOTE — ED Notes (Signed)
Pt waiting for ride to take to dialysis center

## 2013-04-01 NOTE — ED Provider Notes (Signed)
CSN: [id number]     Arrival date & time 04/01/13  1478 History     First MD Initiated Contact with Patient 04/01/13 [phone]     Chief Complaint  Patient presents with  . Chest Injury   (Consider location/radiation/quality/duration/timing/severity/associated sxs/prior Treatment) HPI 79 y o b f with PMH of ESRD- on HD, Dementia, COPD, HTN, GERD, CAD, Pancreatic cyst. Presented with c/o of chest pain that started last night, Rt sided, same area where she sustained rib fractures a year ago when she fell. Pt cant tell if pain is related to position or cough. Has a hx of mild chronic cough, but no recent change in in cough, no fever, no SOB or dixzziness. Pt has an aide that comes in the morning, pt stays alone, but aide is quite sure if pt fell, she would have been told by the pt herself, pt doesn't think she fell.  Vomiting started about 5 am thismorning, had 4-5 episodes, decribed as whitish, large volume, but non bloody.  No diarrhoea or abdominal pain. Pt sausage and grits for breakfast, but cant remember what she had for lunch and dinner yesterday, no recent travels. Pt is on HD for ESRD- dialysis sessions- tue/thur/sat.   Past Medical History  Diagnosis Date  . Hypertension   . Diabetes mellitus   . Hyperlipidemia   . GERD (gastroesophageal reflux disease)   . Glaucoma   . Hammer toe   . Internal hemorrhoids   . Diverticulosis   . Colon polyps   . Peripheral vascular disease   . Anemia   . COPD (chronic obstructive pulmonary disease)   . Stroke 10/2010    Has some memory impairment per DTR  . Myocardial infarction 10/2010  . ESRD on hemodialysis     TTS at [facility]. Started HD    Past Surgical History  Procedure Laterality Date  . Av fistula repair  02/16/2011    Revision of left brachiocephalic arteriovenous fistula with patch angioplasty of left brachiocephalic fistula  . Av fistula placement, brachiocephalic  09/24/2010    Left  . Av fistula placement    . Cataract  extraction    . Abdominal hysterectomy    . Av fistula placement  08/14/2011    Procedure: ARTERIOVENOUS (AV) FISTULA CREATION;  Surgeon: Chuck Hint, MD;  Location: [facility] OR;  Service: Vascular;  Laterality: Right;  Right Arm Arteriovenous Fistula Creation/ contact pt's niece,[name] for any information to be relayed to pt: # [phone]  . Insertion of dialysis catheter  09/28/2011    Procedure: INSERTION OF DIALYSIS CATHETER;  Surgeon: Sherren Kerns, MD;  Location: [facility] OR;  Service: Vascular;  Laterality: Left;  Insertion of Dialysis Catheter left IJ  . Eye surgery    . Av fistula placement  12/07/2011    Procedure: INSERTION OF ARTERIOVENOUS (AV) GORE-TEX GRAFT ARM;  Surgeon: Sherren Kerns, MD;  Location: [facility] OR;  Service: Vascular;  Laterality: Left;  . Thrombectomy and revision of arterioventous (av) goretex  graft  06/28/2012    Procedure: THROMBECTOMY AND REVISION OF ARTERIOVENTOUS (AV) GORETEX  GRAFT;  Surgeon: Chuck Hint, MD;  Location: [facility] OR;  Service: Vascular;  Laterality: Left;   Family History  Problem Relation Age of Onset  . Anesthesia problems Neg Hx   . Hypertension Sister    History  Substance Use Topics  . Smoking status: Never Smoker   . Smokeless tobacco: Never Used  . Alcohol Use: No   OB History   Grav  Para Term Preterm Abortions TAB SAB Ect Mult Living                 Review of Systems No pertinent findings on ROS.  Allergies  Aspirin; Codeine sulfate; and Meperidine hcl  Home Medications   Current Outpatient Rx  Name  Route  Sig  Dispense  Refill  . acetaminophen (TYLENOL) 500 MG tablet   Oral   Take 1 tablet (500 mg total) by mouth every 8 (eight) hours. Take for 5 days then use as needed.   30 tablet   0   . albuterol (PROVENTIL HFA;VENTOLIN HFA) 108 (90 BASE) MCG/ACT inhaler   Inhalation   Inhale 2 puffs into the lungs every 4 (four) hours as needed for wheezing.         Marland Kitchen amLODipine (NORVASC) 2.5 MG tablet   Oral    Take 1 tablet (2.5 mg total) by mouth daily.   31 tablet   0   . aspirin 81 MG chewable tablet   Oral   Chew 81 mg by mouth daily.         Marland Kitchen atorvastatin (LIPITOR) 80 MG tablet   Oral   Take 1 tablet (80 mg total) by mouth daily.   30 tablet   6   . B Complex-C-Folic Acid (RENAL MULTIVITAMIN FORMULA PO)   Oral   Take 1 tablet by mouth daily.          . calcium acetate (PHOSLO) 667 MG capsule   Oral   Take 667 mg by mouth 2 (two) times daily.           . Cetirizine HCl 10 MG CAPS   Oral   Take 1 capsule (10 mg total) by mouth daily.   30 capsule   6   . glipiZIDE (GLUCOTROL) 10 MG tablet   Oral   Take 2 tablets (20 mg total) by mouth 2 (two) times daily before a meal.   120 tablet   6   . guaiFENesin (MUCINEX) 600 MG 12 hr tablet   Oral   Take 600 mg by mouth 2 (two) times daily.         . metoprolol tartrate (LOPRESSOR) 25 MG tablet   Oral   Take 25 mg by mouth 2 (two) times daily.         . ranitidine (ZANTAC) 150 MG tablet   Oral   Take 1 tablet (150 mg total) by mouth 2 (two) times daily.   60 tablet   6   . sitaGLIPtin (JANUVIA) 25 MG tablet   Oral   Take 1 tablet (25 mg total) by mouth daily.   30 tablet   6   . tiotropium (SPIRIVA HANDIHALER) 18 MCG inhalation capsule   Inhalation   Place 1 capsule (18 mcg total) into inhaler and inhale daily.   30 capsule   12    BP 143/55  Pulse 77  Temp(Src) 98.3 F (36.8 C) (Oral)  Resp 14  SpO2 91% Physical Exam GENERAL- alert, oriented to Person and place, answers questions, sometimes appears confused. HEENT- Atraumatic, normocephalic, pupils- Rt- vertically oblong, Lt- normal- both pupils slowly reactive to light. CARDIC- RRR, no murmurs, rubs or gallops. RESP- Lungs clear to ascultation, with some rhonchi on exam. BACK-Mild Tenderness to palpation on post-lat side of the chest, no vertebral or CVA tenderness. SKIN- No lesions or briuses. Extremities- Pulses present bilat, no pedal  edema.  ED Course   Procedures (including critical care  time)  Labs Reviewed  BASIC METABOLIC PANEL - Abnormal; Notable for the following:    Glucose, Bld 238 (*)    BUN 29 (*)    Creatinine, Ser 7.69 (*)    GFR calc non Af Amer 4 (*)    GFR calc Af Amer 5 (*)    All other components within normal limits   Dg Chest 2 View  04/01/2013   *RADIOLOGY REPORT*  Clinical Data: 79 year old female with right chest pain.  CHEST - 2 VIEW  Comparison: 01/09/2013 and prior chest radiographs  Findings: Cardiomegaly again noted. Mild bibasilar atelectasis/scarring again noted. There is no evidence of focal airspace disease, pulmonary edema, suspicious pulmonary nodule/mass, pleural effusion, or pneumothorax. No acute bony abnormalities are identified. Remote rib fractures bilaterally are again noted.  IMPRESSION: Mild bibasilar atelectasis/scarring and cardiomegaly.  Remote bilateral rib fractures.   Original Report Authenticated By: Harmon Pier, M.D.   No diagnosis found.  MDM   Chest pain -Rt sided- Low clinical index of suspicion for PE- No tachypnea, tachycardia, change in cough,no SOB, no dizziness, pt is mobile and no suspicion for ACS-as Chest pain is post lat side of Rt chest. EKG normal. Likely pt sustained some trauma to the area, but cant remember.  - Chest Xray- Mild bibasilar atelectasis/scarring and cardiomegaly. Remote bilateral rib fractures. - D/c home on Tylenol, at discharge px reports pain has almost gone.  Vomiting- Likely food poisoning or uraemic, no other signs or symptoms on presentation to warrant further investigation. Pt was to be dialysed today. -BMP- k- 3.5, Co2- 29, BUN- 29, Cr- 7.69. - Arrange dialysis for pt today, called pt's doctor and he scheduled her to have dialysis today.    Kennis Carina, MD 04/01/13 1625

## 2013-04-01 NOTE — ED Notes (Signed)
Patient states that she had some broken ribs a year ago.  States that the pain returned last night at the same place as her broken ribs.

## 2013-04-01 NOTE — ED Provider Notes (Signed)
Claudia Rich is a 79 y.o. female who presents for evaluation of right rib pain. Pain started during the night, without trauma. It is similar to pain that she had previously when she had a fall and rib fractures. She denies fever, chills, cough, nausea or vomiting. The pain is dull. The pain is almost gone as of 1300 hours.   Exam alert, elderly, female, in mild distress Lungs good air, with scattered rhonchi. No wheezes, or rales. Chest nontender to palpation. Abdomen soft and nontender.  Assessment: nonspecific chest wall pain, she's not in acute respiratory distress. Potassium is normal. She has missed her morning dialysis today. We'll attempt to arrange for dialysis later today, prior to discharge her. Her pain can be treated symptomatically, and expectantly.  I saw and evaluated the patient, reviewed the resident's note and I agree with the findings and plan.    Flint Melter, MD 04/01/13 [phone]

## 2013-04-01 NOTE — ED Notes (Signed)
Pt in xray. Met family. Will return to meet patient

## 2013-04-03 NOTE — Telephone Encounter (Signed)
error 

## 2013-04-17 NOTE — Telephone Encounter (Signed)
The patients niece called because her Aunt still has not received her diabetic testing supplies because the supply company claims they have not received the form that was faxed from 8/8.  I pulled the form from the faxed file from 8/8 and refaxed it to the company, received the faxed confirmation and with the nieces request I faxed the forms with the confirmation to the niece because she wants to have that information in hand so that she can call the company and let them know that she knows they have received what they need.  She knows that [facility] has done everything we are supposed to and she is not at all upset with [place].

## 2013-04-25 ENCOUNTER — Telehealth: Payer: Self-pay | Admitting: *Deleted

## 2013-04-25 DIAGNOSIS — J302 Other seasonal allergic rhinitis: Secondary | ICD-10-CM

## 2013-04-25 MED ORDER — CETIRIZINE HCL 10 MG PO CAPS: 1.0000 | ORAL_CAPSULE | Freq: Every day | ORAL | Status: DC

## 2013-04-25 NOTE — Telephone Encounter (Signed)
Returned call to [place] at [facility]. She is requesting a copy of Claudia Rich's medication list to be faxed to [phone]. Also she is asking if any of her medications should be taken in the PM instead of AM on days of dialysis. I told her according to Dr Aviva Signs she can take all her meds prescribed by her in the AM, should not interfere with her dialysis, but she will need to contact [name]'s renal doctor and ask about the two meds prescribed by him. Confirmed she is no longer taking the predisone and I will send in an Rx for her cetrizine.Busick, [name]

## 2013-04-28 ENCOUNTER — Telehealth: Payer: Self-pay | Admitting: Family Medicine

## 2013-04-28 NOTE — Telephone Encounter (Signed)
[facility] is calling to let Dr. Aviva Signs know that this is now going on a month without diabetic supplies.  All because the company that is supposed to be supplying them claims that they have not received what they have supposed to received.

## 2013-05-01 ENCOUNTER — Telehealth: Payer: Self-pay | Admitting: Family Medicine

## 2013-05-01 NOTE — Telephone Encounter (Signed)
I have update and fill all the forms has been placed in my box for this pt. There was one that needed to be signed by our attending. After that is done form will be faxed.

## 2013-05-01 NOTE — Telephone Encounter (Signed)
Pt called because her online pharmacy faxed [place] a pre-authorization forms for medicare to be signed off on so that [place] can get her medical supplies. They really needs this filled out asap so that she can get her diabetic supplies. JW

## 2013-05-01 NOTE — Telephone Encounter (Signed)
Please advise. Claudia Rich S  

## 2013-05-02 NOTE — Telephone Encounter (Signed)
Will forward to Dr Piloto 

## 2013-05-02 NOTE — Telephone Encounter (Signed)
Dr. Randolm Idol attending this afternoon.  Not sure if he is PECOS MD.  Will await tomorrow and have Dr. Mauricio Po or Lum Babe sign diabetic supplies form.  Gaylene Brooks, RN

## 2013-05-19 ENCOUNTER — Telehealth: Payer: Self-pay | Admitting: Family Medicine

## 2013-05-19 NOTE — Telephone Encounter (Signed)
Consuella Lose a nurse for Claudia Rich called to let the doctor know that Claudia Rich has not been checking her blood sugar daily. The three times she check it this week for fasting it was 135, 59, 160. She just wanted to know. JW

## 2013-05-24 ENCOUNTER — Other Ambulatory Visit: Payer: Self-pay | Admitting: Family Medicine

## 2013-05-26 ENCOUNTER — Other Ambulatory Visit: Payer: Self-pay | Admitting: Family Medicine

## 2013-06-02 ENCOUNTER — Other Ambulatory Visit: Payer: Self-pay | Admitting: Family Medicine

## 2013-06-02 ENCOUNTER — Telehealth: Payer: Self-pay | Admitting: Family Medicine

## 2013-06-02 NOTE — Telephone Encounter (Signed)
Consuella Lose from [facility] calls to inform Dr. Aviva Signs that patient is not being compliant with taking fasting blood sugars every day. Looking at patient log, the last CBG taken was 9/29 and  her reading were all over the place:  9/29 -130 9/25 - 51 9/22 - 135 9/21 -308  Consuella Lose would like to speak to MD, if possible. [phone]. Consuella Lose sees patient every Friday.

## 2013-06-09 ENCOUNTER — Telehealth: Payer: Self-pay | Admitting: Family Medicine

## 2013-06-09 NOTE — Telephone Encounter (Signed)
Claudia Rich called because [name] has poison ivy. She is not sure if she can buy OTC medication or does something needs to be called in. Claudia Rich

## 2013-06-12 NOTE — Telephone Encounter (Signed)
Addressed on 10/17, patient advised to use calamine lotion, come to clinic if symptoms worsen.

## 2013-06-15 NOTE — Telephone Encounter (Signed)
I have tried to call this number with no success. Pt does not need to check her CBG's daily. She is on oral DM treatment.

## 2013-06-21 ENCOUNTER — Telehealth: Payer: Self-pay | Admitting: Family Medicine

## 2013-06-21 NOTE — Telephone Encounter (Signed)
[facility] called to let the team know that Claudia Rich's Fasting labs this week were [phone]. jw

## 2013-06-29 ENCOUNTER — Telehealth: Payer: Self-pay | Admitting: Family Medicine

## 2013-06-29 NOTE — Telephone Encounter (Signed)
[facility] called to let the team know that Claudia Rich's blood sugar was 39 on the 10/27. Also her fasting levels have been 175-204 in the mornings. JW

## 2013-07-24 ENCOUNTER — Telehealth: Payer: Self-pay

## 2013-07-24 NOTE — Telephone Encounter (Signed)
Refill request for metoprolol, glipizide, & Januvia sent to [facility] [address] [facility] [place], [place]. Patient is visiting family in [place] until Jan 18th. Has two dosages of meds left. [phone]

## 2013-07-25 ENCOUNTER — Other Ambulatory Visit: Payer: Self-pay | Admitting: Family Medicine

## 2013-07-25 MED ORDER — SITAGLIPTIN PHOSPHATE 25 MG PO TABS: ORAL_TABLET | ORAL | Status: DC

## 2013-07-25 MED ORDER — GLIPIZIDE 10 MG PO TABS: ORAL_TABLET | ORAL | Status: DC

## 2013-07-25 MED ORDER — METOPROLOL TARTRATE 25 MG PO TABS: 25.0000 mg | ORAL_TABLET | Freq: Two times a day (BID) | ORAL | Status: DC

## 2013-07-25 NOTE — Telephone Encounter (Signed)
Spoke with pt's daughter. Our system does not allowed to e-prescribe this medications to the pharmacy she is requesting. Prescriptions were updated and refilled to her pharmacy here [facility] [place]), pt' s daughter instructed to call pharmacy in order to transfer prescription from pharmacy to pharmacy. She voiced understanding and is agreeable with plan. Metoprolol, Glipizide and Januvia refilled.

## 2013-08-16 ENCOUNTER — Telehealth: Payer: Self-pay | Admitting: Family Medicine

## 2013-08-16 NOTE — Telephone Encounter (Signed)
Pt called and is out of her ventolin. Can we call this in today because they are closed tomorrow. jw

## 2013-08-20 ENCOUNTER — Other Ambulatory Visit: Payer: Self-pay | Admitting: Family Medicine

## 2013-08-20 MED ORDER — ALBUTEROL SULFATE HFA 108 (90 BASE) MCG/ACT IN AERS: 2.0000 | INHALATION_SPRAY | RESPIRATORY_TRACT | Status: DC | PRN

## 2013-08-20 NOTE — Telephone Encounter (Signed)
Ventolin refilled ?

## 2013-08-21 NOTE — Telephone Encounter (Signed)
Niece is aware of this. Jazmin Hartsell,CMA

## 2013-10-03 ENCOUNTER — Telehealth: Payer: Self-pay | Admitting: Family Medicine

## 2013-10-03 DIAGNOSIS — Z742 Need for assistance at home and no other household member able to render care: Secondary | ICD-10-CM

## 2013-10-03 NOTE — Telephone Encounter (Signed)
Ms Claudia Rich is bringing [place] back to [place] this weekend. She would like to reactivate the orders to [facility] for weekly pill box service. Please advice

## 2013-10-04 ENCOUNTER — Ambulatory Visit: Payer: Medicare HMO | Admitting: Vascular Surgery

## 2013-10-04 ENCOUNTER — Other Ambulatory Visit (HOSPITAL_COMMUNITY): Payer: Medicare HMO

## 2013-10-06 ENCOUNTER — Telehealth: Payer: Self-pay | Admitting: Family Medicine

## 2013-10-06 NOTE — Telephone Encounter (Signed)
Beth called from [facility] because they have received orders from us but not sure what they are. Please call and [facility] with [facility] [phone]. jw

## 2013-10-09 ENCOUNTER — Ambulatory Visit (INDEPENDENT_AMBULATORY_CARE_PROVIDER_SITE_OTHER): Payer: Medicare HMO | Admitting: Family Medicine

## 2013-10-09 ENCOUNTER — Encounter: Payer: Self-pay | Admitting: Family Medicine

## 2013-10-09 VITALS — BP 137/79 | HR 87 | Temp 98.7°F | Ht 63.0 in | Wt 163.1 lb

## 2013-10-09 DIAGNOSIS — H9190 Unspecified hearing loss, unspecified ear: Secondary | ICD-10-CM

## 2013-10-09 DIAGNOSIS — F0390 Unspecified dementia without behavioral disturbance: Secondary | ICD-10-CM

## 2013-10-09 DIAGNOSIS — IMO0001 Reserved for inherently not codable concepts without codable children: Secondary | ICD-10-CM

## 2013-10-09 DIAGNOSIS — J449 Chronic obstructive pulmonary disease, unspecified: Secondary | ICD-10-CM

## 2013-10-09 DIAGNOSIS — Z461 Encounter for fitting and adjustment of hearing aid: Secondary | ICD-10-CM

## 2013-10-09 DIAGNOSIS — Z7189 Other specified counseling: Secondary | ICD-10-CM

## 2013-10-09 DIAGNOSIS — E1165 Type 2 diabetes mellitus with hyperglycemia: Principal | ICD-10-CM

## 2013-10-09 LAB — POCT GLYCOSYLATED HEMOGLOBIN (HGB A1C): Hemoglobin A1C: 7

## 2013-10-09 MED ORDER — FLUTICASONE PROPIONATE 50 MCG/ACT NA SUSP: 1.0000 | Freq: Every day | NASAL | Status: DC

## 2013-10-09 MED ORDER — ACETAMINOPHEN 650 MG PO TABS: 650.0000 mg | ORAL_TABLET | Freq: Three times a day (TID) | ORAL | Status: DC

## 2013-10-09 MED ORDER — LORATADINE 10 MG PO TABS: 10.0000 mg | ORAL_TABLET | Freq: Every day | ORAL | Status: DC

## 2013-10-09 NOTE — Telephone Encounter (Signed)
Spoke with Claudia Rich, since pt insurance is [employer] she does not need face to face encounter in order to get PT eval and determine if she needs PT. We discussed that we encourage pt to come for eval here since she has not been seen since 01/2013.

## 2013-10-10 DIAGNOSIS — H9190 Unspecified hearing loss, unspecified ear: Secondary | ICD-10-CM | POA: Insufficient documentation

## 2013-10-10 NOTE — Progress Notes (Signed)
Family Medicine Office Visit Note   Subjective:   Patient ID: Claudia Rich, female  DOB: 03/31/1927, 80 y.o.. MRN: [id number]   Pt that comes today accompanied by her daughter. [place] was in [place] months and now is returning to her home here. [facility] by herself but [facility] that comes and help with medication as well as friends that help her with her immediate needs. Pt goes to HD TTS and reports compliance during the time she was in [place].   Her complaint today comes more from her daughter and it is related to her hearing. She reports has noticed a decrease in hearing more when TV is on. Denies tinnitus or dizziness or other symptoms.  Review of Systems:  Pt denies SOB, chest pain, palpitations, headaches, numbness or weakness. No changes on urinary or BM habits. No unintentional weigh loss/gain.  Objective:   Physical Exam: Gen:  NAD HEENT: Moist mucous membranes  Ears: normal era canal and TM visualized and intact bilaterally. Normal cerumen amount. No impaction.  CV: Regular rate and rhythm, no murmurs rubs or gallops PULM: Clear to auscultation bilaterally. No wheezes/rales/rhonchi ABD: Soft, non tender, non distended, normal bowel sounds EXT: No edema Neuro: Alert and oriented x3. No focalization. whisper test is positive bilaterally. Rinne does not lateralize. Claudia Rich was unable to be diagnostic. It seemed more bone conduction than air but not conclusive.   Assessment & Plan:

## 2013-10-10 NOTE — Assessment & Plan Note (Signed)
A1C is 7 at goal. No changes in her current regimen.

## 2013-10-10 NOTE — Assessment & Plan Note (Signed)
Most likely presbycusis. Will refer to audiology.

## 2013-10-10 NOTE — Patient Instructions (Signed)
It has been a pleasure to see you today. Please take the medications as prescribed. No changes on your regimen has been done today.  I have refer you to audiology in order to determine if you benefit from hearing aids. You will receive a call with pertinent information regarding this appointment.  Follow up here in 3-4 months or sooner if needed.

## 2013-10-10 NOTE — Assessment & Plan Note (Signed)
Stable

## 2013-10-10 NOTE — Assessment & Plan Note (Signed)
No recent exacerbations.  

## 2013-10-13 ENCOUNTER — Telehealth: Payer: Self-pay | Admitting: Family Medicine

## 2013-10-13 NOTE — Telephone Encounter (Signed)
[facility], received order for PT but patient does not need PT. She will be a non-admit and [facility]. FYI

## 2013-10-14 ENCOUNTER — Inpatient Hospital Stay (HOSPITAL_COMMUNITY)
Admission: EM | Admit: 2013-10-14 | Discharge: 2013-10-16 | DRG: 193 | Disposition: A | Discharge status: Home / Self Care | Payer: Medicare HMO | Attending: Family Medicine | Admitting: Family Medicine

## 2013-10-14 ENCOUNTER — Encounter (HOSPITAL_COMMUNITY): Payer: Self-pay | Admitting: Emergency Medicine

## 2013-10-14 ENCOUNTER — Emergency Department (HOSPITAL_COMMUNITY): Payer: Medicare HMO

## 2013-10-14 DIAGNOSIS — Y95 Nosocomial condition: Secondary | ICD-10-CM | POA: Diagnosis present

## 2013-10-14 DIAGNOSIS — K219 Gastro-esophageal reflux disease without esophagitis: Secondary | ICD-10-CM | POA: Diagnosis present

## 2013-10-14 DIAGNOSIS — E785 Hyperlipidemia, unspecified: Secondary | ICD-10-CM | POA: Diagnosis present

## 2013-10-14 DIAGNOSIS — I120 Hypertensive chronic kidney disease with stage 5 chronic kidney disease or end stage renal disease: Secondary | ICD-10-CM | POA: Diagnosis present

## 2013-10-14 DIAGNOSIS — N186 End stage renal disease: Secondary | ICD-10-CM | POA: Diagnosis present

## 2013-10-14 DIAGNOSIS — Z860100 Personal history of colon polyps, unspecified: Secondary | ICD-10-CM

## 2013-10-14 DIAGNOSIS — IMO0001 Reserved for inherently not codable concepts without codable children: Secondary | ICD-10-CM

## 2013-10-14 DIAGNOSIS — E876 Hypokalemia: Secondary | ICD-10-CM

## 2013-10-14 DIAGNOSIS — N184 Chronic kidney disease, stage 4 (severe): Secondary | ICD-10-CM

## 2013-10-14 DIAGNOSIS — Z8673 Personal history of transient ischemic attack (TIA), and cerebral infarction without residual deficits: Secondary | ICD-10-CM

## 2013-10-14 DIAGNOSIS — E119 Type 2 diabetes mellitus without complications: Secondary | ICD-10-CM | POA: Diagnosis present

## 2013-10-14 DIAGNOSIS — Z992 Dependence on renal dialysis: Secondary | ICD-10-CM

## 2013-10-14 DIAGNOSIS — I252 Old myocardial infarction: Secondary | ICD-10-CM

## 2013-10-14 DIAGNOSIS — I2510 Atherosclerotic heart disease of native coronary artery without angina pectoris: Secondary | ICD-10-CM | POA: Diagnosis present

## 2013-10-14 DIAGNOSIS — J189 Pneumonia, unspecified organism: Principal | ICD-10-CM | POA: Diagnosis present

## 2013-10-14 DIAGNOSIS — Z7982 Long term (current) use of aspirin: Secondary | ICD-10-CM

## 2013-10-14 DIAGNOSIS — E1165 Type 2 diabetes mellitus with hyperglycemia: Secondary | ICD-10-CM

## 2013-10-14 DIAGNOSIS — R110 Nausea: Secondary | ICD-10-CM

## 2013-10-14 DIAGNOSIS — J479 Bronchiectasis, uncomplicated: Secondary | ICD-10-CM | POA: Diagnosis present

## 2013-10-14 LAB — COMPREHENSIVE METABOLIC PANEL
ALT: 17 U/L (ref 0–35)
AST: 21 U/L (ref 0–37)
Albumin: 3.7 g/dL (ref 3.5–5.2)
Alkaline Phosphatase: 158 U/L — ABNORMAL HIGH (ref 39–117)
BILIRUBIN TOTAL: 0.5 mg/dL (ref 0.3–1.2)
BUN: 14 mg/dL (ref 6–23)
CHLORIDE: 91 meq/L — AB (ref 96–112)
CO2: 25 meq/L (ref 19–32)
CREATININE: 4.27 mg/dL — AB (ref 0.50–1.10)
Calcium: 8.9 mg/dL (ref 8.4–10.5)
GFR, EST AFRICAN AMERICAN: 10 mL/min — AB (ref >90)
GFR, EST NON AFRICAN AMERICAN: 9 mL/min — AB (ref >90)
GLUCOSE: 211 mg/dL — AB (ref 70–99)
Potassium: 2.8 mEq/L — CL (ref 3.7–5.3)
Sodium: 134 mEq/L — ABNORMAL LOW (ref 137–147)
Total Protein: 9.2 g/dL — ABNORMAL HIGH (ref 6.0–8.3)

## 2013-10-14 LAB — CBC
HEMATOCRIT: 34.4 % — AB (ref 36.0–46.0)
Hemoglobin: 11.1 g/dL — ABNORMAL LOW (ref 12.0–15.0)
MCH: 32.8 pg (ref 26.0–34.0)
MCHC: 32.3 g/dL (ref 30.0–36.0)
MCV: 101.8 fL — AB (ref 78.0–100.0)
Platelets: 143 10*3/uL — ABNORMAL LOW (ref 150–400)
RBC: 3.38 MIL/uL — AB (ref 3.87–5.11)
RDW: 14.6 % (ref 11.5–15.5)
WBC: 14.7 10*3/uL — AB (ref 4.0–10.5)

## 2013-10-14 LAB — GLUCOSE, CAPILLARY: GLUCOSE-CAPILLARY: 209 mg/dL — AB (ref 70–99)

## 2013-10-14 MED ORDER — HEPARIN SODIUM (PORCINE) 5000 UNIT/ML IJ SOLN
5000.0000 [IU] | Freq: Three times a day (TID) | INTRAMUSCULAR | Status: DC
  Administered 2013-10-15 – 2013-10-16 (×4): 5000 [IU] via SUBCUTANEOUS
  Filled 2013-10-14 (×6): qty 1

## 2013-10-14 MED ORDER — METOPROLOL TARTRATE 25 MG PO TABS: 25.0000 mg | ORAL_TABLET | ORAL | Status: DC

## 2013-10-14 MED ORDER — ONDANSETRON HCL 4 MG/2ML IJ SOLN: 4.0000 mg | Freq: Four times a day (QID) | INTRAMUSCULAR | Status: DC | PRN

## 2013-10-14 MED ORDER — INSULIN ASPART 100 UNIT/ML ~~LOC~~ SOLN
0.0000 [IU] | Freq: Three times a day (TID) | SUBCUTANEOUS | Status: DC
  Administered 2013-10-15 (×3): 2 [IU] via SUBCUTANEOUS
  Administered 2013-10-16: 3 [IU] via SUBCUTANEOUS

## 2013-10-14 MED ORDER — VANCOMYCIN HCL 10 G IV SOLR
1500.0000 mg | Freq: Once | INTRAVENOUS | Status: AC
  Administered 2013-10-14: 1500 mg via INTRAVENOUS
  Filled 2013-10-14: qty 1500

## 2013-10-14 MED ORDER — ACETAMINOPHEN 650 MG RE SUPP: 650.0000 mg | Freq: Four times a day (QID) | RECTAL | Status: DC | PRN

## 2013-10-14 MED ORDER — SODIUM CHLORIDE 0.9 % IV BOLUS (SEPSIS): 250.0000 mL | Freq: Once | INTRAVENOUS | Status: DC

## 2013-10-14 MED ORDER — SODIUM CHLORIDE 0.9 % IJ SOLN: 3.0000 mL | INTRAMUSCULAR | Status: DC | PRN

## 2013-10-14 MED ORDER — SODIUM CHLORIDE 0.9 % IV SOLN: 250.0000 mL | INTRAVENOUS | Status: DC | PRN

## 2013-10-14 MED ORDER — DOCUSATE SODIUM 100 MG PO CAPS
100.0000 mg | ORAL_CAPSULE | Freq: Two times a day (BID) | ORAL | Status: DC
  Administered 2013-10-15 – 2013-10-16 (×3): 100 mg via ORAL
  Filled 2013-10-14 (×4): qty 1

## 2013-10-14 MED ORDER — SODIUM CHLORIDE 0.9 % IV BOLUS (SEPSIS)
250.0000 mL | Freq: Once | INTRAVENOUS | Status: AC
  Administered 2013-10-14: 250 mL via INTRAVENOUS

## 2013-10-14 MED ORDER — ACETAMINOPHEN 325 MG PO TABS: 650.0000 mg | ORAL_TABLET | Freq: Four times a day (QID) | ORAL | Status: DC | PRN

## 2013-10-14 MED ORDER — TIOTROPIUM BROMIDE MONOHYDRATE 18 MCG IN CAPS
18.0000 ug | ORAL_CAPSULE | Freq: Every day | RESPIRATORY_TRACT | Status: DC
  Administered 2013-10-15: 18 ug via RESPIRATORY_TRACT
  Filled 2013-10-14 (×2): qty 5

## 2013-10-14 MED ORDER — ONDANSETRON HCL 4 MG/2ML IJ SOLN
4.0000 mg | Freq: Once | INTRAMUSCULAR | Status: AC
  Administered 2013-10-14: 4 mg via INTRAVENOUS
  Filled 2013-10-14: qty 2

## 2013-10-14 MED ORDER — DEXTROSE 5 % IV SOLN
1.0000 g | Freq: Once | INTRAVENOUS | Status: AC
  Administered 2013-10-14: 1 g via INTRAVENOUS
  Filled 2013-10-14: qty 1

## 2013-10-14 MED ORDER — ALBUTEROL SULFATE (2.5 MG/3ML) 0.083% IN NEBU: 2.5000 mg | INHALATION_SOLUTION | RESPIRATORY_TRACT | Status: DC | PRN

## 2013-10-14 MED ORDER — ASPIRIN 81 MG PO CHEW
81.0000 mg | CHEWABLE_TABLET | Freq: Every day | ORAL | Status: DC
  Administered 2013-10-15 – 2013-10-16 (×2): 81 mg via ORAL
  Filled 2013-10-14 (×2): qty 1

## 2013-10-14 MED ORDER — LORATADINE 10 MG PO TABS
10.0000 mg | ORAL_TABLET | Freq: Every day | ORAL | Status: DC
  Administered 2013-10-15 – 2013-10-16 (×2): 10 mg via ORAL
  Filled 2013-10-14 (×2): qty 1

## 2013-10-14 MED ORDER — POTASSIUM CHLORIDE CRYS ER 20 MEQ PO TBCR
40.0000 meq | EXTENDED_RELEASE_TABLET | ORAL | Status: AC
  Administered 2013-10-15 (×3): 40 meq via ORAL
  Filled 2013-10-14 (×3): qty 2

## 2013-10-14 MED ORDER — AMLODIPINE BESYLATE 2.5 MG PO TABS
2.5000 mg | ORAL_TABLET | Freq: Every day | ORAL | Status: DC
  Administered 2013-10-15: 2.5 mg via ORAL
  Filled 2013-10-14: qty 1

## 2013-10-14 MED ORDER — ONDANSETRON HCL 4 MG PO TABS: 4.0000 mg | ORAL_TABLET | Freq: Four times a day (QID) | ORAL | Status: DC | PRN

## 2013-10-14 MED ORDER — ATORVASTATIN CALCIUM 80 MG PO TABS
80.0000 mg | ORAL_TABLET | Freq: Every day | ORAL | Status: DC
  Administered 2013-10-15 – 2013-10-16 (×2): 80 mg via ORAL
  Filled 2013-10-14 (×2): qty 1

## 2013-10-14 MED ORDER — CALCIUM ACETATE 667 MG PO CAPS
1334.0000 mg | ORAL_CAPSULE | Freq: Two times a day (BID) | ORAL | Status: DC
  Administered 2013-10-15 – 2013-10-16 (×4): 1334 mg via ORAL
  Filled 2013-10-14 (×5): qty 2

## 2013-10-14 MED ORDER — SODIUM CHLORIDE 0.9 % IJ SOLN
3.0000 mL | Freq: Two times a day (BID) | INTRAMUSCULAR | Status: DC
  Administered 2013-10-15 – 2013-10-16 (×3): 3 mL via INTRAVENOUS

## 2013-10-14 MED ORDER — METOPROLOL TARTRATE 25 MG PO TABS
25.0000 mg | ORAL_TABLET | ORAL | Status: DC
  Administered 2013-10-15 – 2013-10-16 (×3): 25 mg via ORAL
  Filled 2013-10-14 (×4): qty 1

## 2013-10-14 MED ORDER — FLUTICASONE PROPIONATE 50 MCG/ACT NA SUSP
1.0000 | Freq: Every day | NASAL | Status: DC
  Administered 2013-10-15 – 2013-10-16 (×2): 1 via NASAL
  Filled 2013-10-14: qty 16

## 2013-10-14 MED ORDER — CEFEPIME HCL 2 G IJ SOLR: 2.0000 g | INTRAMUSCULAR | Status: DC

## 2013-10-14 NOTE — ED Provider Notes (Signed)
Claudia Rich S 8:40 PM Claudia Rich discussed in signout. Claudia Rich 80 year old female on dialysis presenting with increased weakness continued coughing and shortness of breath. Lab testing within normal ranges for Claudia Rich. Chest x-ray pending. Chest x-ray unremarkable Claudia Rich may be discharged home with her continued followup.   9:40 PM chest x-ray concerning for right lung pneumonia. Claudia Rich continues to maintain good O2 sats on room air. No tachypnea. She is afebrile. Discussed the findings with the Claudia Rich and family. Claudia Rich lives alone he is concerned about her increased weakness. Given her pneumonia we'll plan to treat with cefepime and vancomycin per pharmacy. We'll consult family practice for admission.   9:55 PM spoke with family practice resident they will see Claudia Rich and admit. Would like holding orders for a telemetry bed.  Angus Sellereter S Shahidah Nesbitt, PA-C 10/14/13 2159

## 2013-10-14 NOTE — Progress Notes (Signed)
ANTIBIOTIC CONSULT NOTE - INITIAL  Pharmacy Consult for vancomycin  Indication: pneumonia  Allergies  Allergen Reactions  . Aspirin     Upset stomach (pt takes low dose at home)  . Codeine Sulfate Other (See Comments)    unknown  . Meperidine Hcl     REACTION: vomiting    Patient Measurements:   Adjusted Body Weight: 74 kg  Vital Signs: Temp: 98 F (36.7 C) (02/21 2015) Temp src: Oral (02/21 2015) BP: 138/58 mmHg (02/21 2115) Pulse Rate: 84 (02/21 2115) Intake/Output from previous day:   Intake/Output from this shift:    Labs:  Recent Labs  10/14/13 1801  WBC 14.7*  HGB 11.1*  PLT 143*  CREATININE 4.27*   The CrCl is unknown because both a height and weight (above a minimum accepted value) are required for this calculation. No results found for this basename: VANCOTROUGH, VANCOPEAK, VANCORANDOM, GENTTROUGH, GENTPEAK, GENTRANDOM, TOBRATROUGH, TOBRAPEAK, TOBRARND, AMIKACINPEAK, AMIKACINTROU, AMIKACIN,  in the last 72 hours   Microbiology: No results found for this or any previous visit (from the past 720 hour(s)).  Medical History: Past Medical History  Diagnosis Date  . Hypertension   . Diabetes mellitus   . Hyperlipidemia   . GERD (gastroesophageal reflux disease)   . Glaucoma   . Hammer toe   . Internal hemorrhoids   . Diverticulosis   . Colon polyps   . Peripheral vascular disease   . Anemia   . COPD (chronic obstructive pulmonary disease)   . Stroke 10/2010    Has some memory impairment per DTR  . Myocardial infarction 10/2010  . ESRD on hemodialysis     TTS at [facility]. Started HD     Medications:  Scheduled:    Assessment: 80 yo female with ESRD admitted with PNA.  Pharmacy asked to begin empiric therapy with vancomycin.  Last HD prior to admission today.  Goal of Therapy:  Pre-HD vancomycin level 15-25  Plan:  1. Will give vancomycin 1500 mg IV x 1 now. 2. F/u plans for next HD for redosing vancomycin. 3. F/u plans to continue  cefepime?  Tad MooreJessica Trenice Mesa, Pharm D, BCPS  Clinical Pharmacist Pager [phone]  10/14/2013 10:00 PM

## 2013-10-14 NOTE — H&P (Signed)
Family Medicine Teaching [facility] Admission History and Physical Service Pager: [phone]  Patient name: Claudia Rich Medical record number: [id number] Date of birth: 1926/08/28 Age: 80 y.o. Gender: female  Primary Care Provider: Lillia Abed, MD Consultants: None Code Status: Full code  Chief Complaint: Nausea/vomiting  Assessment and Plan: Claudia Rich is a 80 y.o. female presenting with nausea and vomiting.  Workup in the emergency department revealed hypokalemia and right middle lobe and right lower lobe infiltrate.  PMH is significant for ESRD on HD T-Th-Sa, CAD, COPD, Stroke, DM-2, HLD, and HTN.   HCAP - patient with infiltrate on x-ray.  Given patient is ESRD on hemodialysis and in health care setting frequently, treating as a HCAP. - Vanc and Cefepime per pharm - Trend CBC - Monitor closely  Nausea and vomiting - likely secondary to underlying illness and potentially from HD. - NS 250 mL bolus given in ED - No additional fluids at this time given ESRD; PO intake - Clear liquid diet - PRN Zofran for nausea/vomiting  Hypokalemia  - K+ 2.8 on admission - Will replete orally - K-Dur 40 mEq x 3 - Renal panel in the am  ESRD - HD T-Th-Sa - Completed dialysis today.  - No current indications for acute dialysis - Continuing home Phoslo  CAD, HTN, HLD - Continuing home Metoprolol, aspirin, amlodipine, Lipitor  Stroke  - ASA 81 mg daily  COPD - No evidence of exacerbation currently. Continuing home albuterol and Spiriva.   FEN/GI: Clear liquid diet Prophylaxis: Heparin  Disposition: Pending clinical improvement.   History of Present Illness:  Claudia Rich is a 80 y.o. female with a PMH of ESRD on HD T-Th-Sa, CAD, COPD, Stroke, Dementia, DM-2, and HTN who presents with nausea/vomiting and lethargy following dialysis.   Patient reports that after returning from hemodialysis today she developed sudden onset nausea and vomiting.  She reports the emesis was  nonbloody and nonbilious.  Her daughter came by to check on her this evening, and noted that she was very lethargic after having vomited several times.  Her daughter reports that she was somnolent and difficult to arouse.  She says when called EMS and patient was taken to the ED.   Patient denies any other associated symptoms: No recent fevers, chills.  No shortness of breath or chest pain.  No known sick contacts.  She reports that she was well prior and during dialysis.  She states that she completed her full dialysis treatment.    Review Of Systems: Per HPI. Otherwise 12 point review of systems was performed and was unremarkable.  Patient Active Problem List   Diagnosis Date Noted  . Hospital-acquired pneumonia 10/14/2013  . Hearing difficulty 10/10/2013  . COPD (chronic obstructive pulmonary disease) 01/23/2013  . Pancreatic cyst 01/23/2013  . Allergy 09/13/2012  . Dementia 07/02/2012  . Hx of Closed rib fracture 06/25/2012  . CAD (coronary artery disease) 05/23/2012  . End stage renal disease 09/23/2011  . CHRONIC KIDNEY DISEASE STAGE IV (SEVERE) 05/07/2010  . ANEMIA, NORMOCYTIC 03/20/2009  . Essential hypertension, benign 06/10/2007  . DIABETES MELLITUS, TYPE II,  10/29/2006  . HYPERCHOLESTEROLEMIA, PURE 10/29/2006  . G E R D 10/29/2006   Past Medical History: Past Medical History  Diagnosis Date  . Hypertension   . Diabetes mellitus   . Hyperlipidemia   . GERD (gastroesophageal reflux disease)   . Glaucoma   . Hammer toe   . Internal hemorrhoids   . Diverticulosis   .  Colon polyps   . Peripheral vascular disease   . Anemia   . COPD (chronic obstructive pulmonary disease)   . Stroke 10/2010    Has some memory impairment per DTR  . Myocardial infarction 10/2010  . ESRD on hemodialysis     [facility]. Started HD    Past Surgical History: Past Surgical History  Procedure Laterality Date  . Av fistula repair  02/16/2011    Revision of left brachiocephalic  arteriovenous fistula with patch angioplasty of left brachiocephalic fistula  . Av fistula placement, brachiocephalic  09/24/2010    Left  . Av fistula placement    . Cataract extraction    . Abdominal hysterectomy    . Av fistula placement  08/14/2011    Procedure: ARTERIOVENOUS (AV) FISTULA CREATION;  Surgeon: Chuck Hinthristopher S Dickson, MD;  [facility];  Service: Vascular;  Laterality: Right;  Right Arm Arteriovenous Fistula Creation/ contact pt's niece,[name] for any information to be relayed to pt: # [phone]  . Insertion of dialysis catheter  09/28/2011    Procedure: INSERTION OF DIALYSIS CATHETER;  Surgeon: Sherren Kernsharles E Fields, MD;  [facility];  Service: Vascular;  Laterality: Left;  Insertion of Dialysis Catheter left IJ  . Eye surgery    . Av fistula placement  12/07/2011    Procedure: INSERTION OF ARTERIOVENOUS (AV) GORE-TEX GRAFT ARM;  Surgeon: Sherren Kernsharles E Fields, MD;  [facility];  Service: Vascular;  Laterality: Left;  . Thrombectomy and revision of arterioventous (av) goretex  graft  06/28/2012    Procedure: THROMBECTOMY AND REVISION OF ARTERIOVENTOUS (AV) GORETEX  GRAFT;  Surgeon: Chuck Hinthristopher S Dickson, MD;  Location: [facility] OR;  Service: Vascular;  Laterality: Left;   Social History: History  Substance Use Topics  . Smoking status: Never Smoker   . Smokeless tobacco: Never Used  . Alcohol Use: No    Family History: Family History  Problem Relation Age of Onset  . Anesthesia problems Neg Hx   . Hypertension Sister    Allergies and Medications: Allergies  Allergen Reactions  . Aspirin     Upset stomach (pt takes low dose at home)  . Codeine Sulfate Other (See Comments)    unknown  . Meperidine Hcl     REACTION: vomiting   No current facility-administered medications on file prior to encounter.   Current Outpatient Prescriptions on File Prior to Encounter  Medication Sig Dispense Refill  . albuterol (PROVENTIL HFA;VENTOLIN HFA) 108 (90 BASE) MCG/ACT inhaler  Inhale 2 puffs into the lungs every 4 (four) hours as needed for wheezing.  1 Inhaler  5  . amLODipine (NORVASC) 2.5 MG tablet Take 1 tablet (2.5 mg total) by mouth daily.  31 tablet  0  . aspirin 81 MG chewable tablet Chew 81 mg by mouth daily.      Marland Kitchen. atorvastatin (LIPITOR) 80 MG tablet TAKE ONE TABLET AT BEDTIME.  30 tablet  12  . B Complex-C-Folic Acid (RENAL MULTIVITAMIN FORMULA PO) Take 1 tablet by mouth daily.       . calcium acetate (PHOSLO) 667 MG capsule Take 1,334 mg by mouth 2 (two) times daily.       . fluticasone (FLONASE) 50 MCG/ACT nasal spray Place 1 spray into both nostrils daily.  16 g  6  . glipiZIDE (GLUCOTROL) 10 MG tablet TAKE 2 TABLETS TWICE DAILY BEFORE MEALS.  120 tablet  4  . loratadine (CLARITIN) 10 MG tablet Take 1 tablet (10 mg total) by mouth daily.  30 tablet  11  . MUCINEX 600 MG 12 hr tablet TAKE 1 TABLET TWICE DAILY.  60 tablet  2  . sitaGLIPtin (JANUVIA) 25 MG tablet TAKE 1 TABLET ONCE DAILY.  30 tablet  6  . tiotropium (SPIRIVA HANDIHALER) 18 MCG inhalation capsule Place 1 capsule (18 mcg total) into inhaler and inhale daily.  30 capsule  12    Objective: BP 130/48  Pulse 82  Temp(Src) 98 F (36.7 C) (Oral)  Resp 13  SpO2 96% Exam: General: well appearing, elderly female, resting in bed, no acute distress.  HEENT: Slightly dry MM. Cardiovascular: RRR. No murmur appreciated.  Respiratory: R basilar and RML crackles noted.  Abdomen: soft, nontender, nondistended. No guarding or rebound. Extremities: No LE edema noted. Access: L AV fistula - no erythema. Palpable thrill.  Skin: warm,  dry, intact Neuro: patient with memory impairment and word finding difficulties. Per family this is patient's baseline.  No focal neurological deficits.   Labs and Imaging: CBC BMET   Recent Labs Lab 10/14/13 1801  WBC 14.7*  HGB 11.1*  HCT 34.4*  PLT 143*    Recent Labs Lab 10/14/13 1801  NA 134*  K 2.8*  CL 91*  CO2 25  BUN 14  CREATININE 4.27*   GLUCOSE 211*  CALCIUM 8.9     Lab Results  Component Value Date   HGBA1C 7.0 10/09/2013   Dg Chest 2 View 10/14/2013   IMPRESSION: 1. Pneumonia suspected in the right lower lobe and in the right middle lobe, superimposed upon the right lower lobe bronchiectasis previously identified. Stable left lower lobe bronchiectasis without superimposed opacity. 2. Stable moderate cardiomegaly without evidence of pulmonary edema.  Micro: Blood culture - Pending  Tommie Sams, DO 10/14/2013, 11:10 PM PGY-2, [facility] FPTS Intern pager: [phone], text pages welcome

## 2013-10-14 NOTE — ED Provider Notes (Signed)
CSN: [id number]     Arrival date & time 10/14/13  1753 History   First MD Initiated Contact with Patient 10/14/13 1758     Chief Complaint  Patient presents with  . Nausea  . Weakness     (Consider location/radiation/quality/duration/timing/severity/associated sxs/prior Treatment) Patient is a 80 y.o. female presenting with weakness. The history is provided by the patient.  Weakness Pertinent negatives include no chest pain, no abdominal pain, no headaches and no shortness of breath.  pt with hx esrd on hd, had normal hd today, states after completing and home, onset nausea, vomiting x 1, not bloody or bilious, and felt generally weak.  States last time she felt this way too much fluid was taken off at dialyses.  Pt states felt fine prior to dialyses. Denies abd pain. No nvd. Had normal bm in past day. No abd distension. Denies dysuria.  No fever or chills. Denies headache. No cp or discomfort. No sob. +non prod cough. No sore throat, nasal congestion, body aches, or other uri c/o.  Denies any recent change in meds, compliant w normal meds.      Past Medical History  Diagnosis Date  . Hypertension   . Diabetes mellitus   . Hyperlipidemia   . GERD (gastroesophageal reflux disease)   . Glaucoma   . Hammer toe   . Internal hemorrhoids   . Diverticulosis   . Colon polyps   . Peripheral vascular disease   . Anemia   . COPD (chronic obstructive pulmonary disease)   . Stroke 10/2010    Has some memory impairment per DTR  . Myocardial infarction 10/2010  . ESRD on hemodialysis     TTS at [facility]. Started HD    Past Surgical History  Procedure Laterality Date  . Av fistula repair  02/16/2011    Revision of left brachiocephalic arteriovenous fistula with patch angioplasty of left brachiocephalic fistula  . Av fistula placement, brachiocephalic  09/24/2010    Left  . Av fistula placement    . Cataract extraction    . Abdominal hysterectomy    . Av fistula placement  08/14/2011    Procedure: ARTERIOVENOUS (AV) FISTULA CREATION;  Surgeon: Chuck Hint, MD;  Location: [facility] OR;  Service: Vascular;  Laterality: Right;  Right Arm Arteriovenous Fistula Creation/ contact pt's niece,[name] for any information to be relayed to pt: # [phone]  . Insertion of dialysis catheter  09/28/2011    Procedure: INSERTION OF DIALYSIS CATHETER;  Surgeon: Sherren Kerns, MD;  Location: [facility] OR;  Service: Vascular;  Laterality: Left;  Insertion of Dialysis Catheter left IJ  . Eye surgery    . Av fistula placement  12/07/2011    Procedure: INSERTION OF ARTERIOVENOUS (AV) GORE-TEX GRAFT ARM;  Surgeon: Sherren Kerns, MD;  Location: [facility] OR;  Service: Vascular;  Laterality: Left;  . Thrombectomy and revision of arterioventous (av) goretex  graft  06/28/2012    Procedure: THROMBECTOMY AND REVISION OF ARTERIOVENTOUS (AV) GORETEX  GRAFT;  Surgeon: Chuck Hint, MD;  [facility];  Service: Vascular;  Laterality: Left;   Family History  Problem Relation Age of Onset  . Anesthesia problems Neg Hx   . Hypertension Sister    History  Substance Use Topics  . Smoking status: Never Smoker   . Smokeless tobacco: Never Used  . Alcohol Use: No   OB History   Grav Para Term Preterm Abortions TAB SAB Ect Mult Living  Review of Systems  Constitutional: Negative for fever and chills.  HENT: Negative for sore throat.   Eyes: Negative for redness.  Respiratory: Positive for cough. Negative for shortness of breath.        Occasional non prod cough  Cardiovascular: Negative for chest pain.  Gastrointestinal: Positive for nausea and vomiting. Negative for abdominal pain, diarrhea, constipation and abdominal distention.  Endocrine: Negative for polyuria.  Genitourinary: Negative for dysuria and flank pain.  Musculoskeletal: Negative for back pain and neck pain.  Skin: Negative for rash.  Neurological: Positive for weakness. Negative for headaches.  Hematological: Does  not bruise/bleed easily.  Psychiatric/Behavioral: Negative for confusion.      Allergies  Aspirin; Codeine sulfate; and Meperidine hcl  Home Medications   Current Outpatient Rx  Name  Route  Sig  Dispense  Refill  . Acetaminophen 650 MG TABS   Oral   Take 1 tablet (650 mg total) by mouth every 8 (eight) hours.   40 tablet   3   . albuterol (PROVENTIL HFA;VENTOLIN HFA) 108 (90 BASE) MCG/ACT inhaler   Inhalation   Inhale 2 puffs into the lungs every 4 (four) hours as needed for wheezing.   1 Inhaler   5   . amLODipine (NORVASC) 2.5 MG tablet   Oral   Take 1 tablet (2.5 mg total) by mouth daily.   31 tablet   0   . aspirin 81 MG chewable tablet   Oral   Chew 81 mg by mouth daily.         Marland Kitchen. atorvastatin (LIPITOR) 80 MG tablet      TAKE ONE TABLET AT BEDTIME.   30 tablet   12   . B Complex-C-Folic Acid (RENAL MULTIVITAMIN FORMULA PO)   Oral   Take 1 tablet by mouth daily.          . calcium acetate (PHOSLO) 667 MG capsule   Oral   Take 667 mg by mouth 2 (two) times daily.           . fluticasone (FLONASE) 50 MCG/ACT nasal spray   Each Nare   Place 1 spray into both nostrils daily.   16 g   6   . glipiZIDE (GLUCOTROL) 10 MG tablet      TAKE 2 TABLETS TWICE DAILY BEFORE MEALS.   120 tablet   4   . loratadine (CLARITIN) 10 MG tablet   Oral   Take 1 tablet (10 mg total) by mouth daily.   30 tablet   11   . metoprolol tartrate (LOPRESSOR) 25 MG tablet   Oral   Take 1 tablet (25 mg total) by mouth 2 (two) times daily.   30 tablet   6   . MUCINEX 600 MG 12 hr tablet      TAKE 1 TABLET TWICE DAILY.   60 tablet   2   . ranitidine (ZANTAC) 150 MG tablet      TAKE 1 TABLET TWICE DAILY.   60 tablet   5   . sitaGLIPtin (JANUVIA) 25 MG tablet      TAKE 1 TABLET ONCE DAILY.   30 tablet   6   . tiotropium (SPIRIVA HANDIHALER) 18 MCG inhalation capsule   Inhalation   Place 1 capsule (18 mcg total) into inhaler and inhale daily.   30  capsule   12    BP 142/58  Pulse 74  Temp(Src) 97.4 F (36.3 C) (Oral)  Resp 23  SpO2 100% Physical Exam  Nursing note and vitals reviewed. Constitutional: She is oriented to person, place, and time. She appears well-developed and well-nourished. No distress.  HENT:  Head: Atraumatic.  Nose: Nose normal.  Mouth/Throat: Oropharynx is clear and moist.  Eyes: Conjunctivae are normal. Pupils are equal, round, and reactive to light. No scleral icterus.  Neck: Normal range of motion. Neck supple. No tracheal deviation present. No thyromegaly present.  No stiffness or rigidity  Cardiovascular: Normal rate, normal heart sounds and intact distal pulses.   Pulmonary/Chest: Effort normal and breath sounds normal. No respiratory distress.  Abdominal: Soft. Normal appearance and bowel sounds are normal. She exhibits no distension and no mass. There is no tenderness. There is no rebound and no guarding.  No incarc hernia.   Genitourinary:  No cva tenderness  Musculoskeletal: She exhibits no edema.  Left upper arm hd graft w palp thrill  Neurological: She is alert and oriented to person, place, and time.  Skin: Skin is warm and dry. No rash noted. She is not diaphoretic.  Psychiatric: She has a normal mood and affect.    ED Course  Procedures (including critical care time)   Results for orders placed during the hospital encounter of 10/14/13  CBC      Result Value Ref Range   WBC 14.7 (*) 4.0 - 10.5 K/uL   RBC 3.38 (*) 3.87 - 5.11 MIL/uL   Hemoglobin 11.1 (*) 12.0 - 15.0 g/dL   HCT 16.1 (*) 09.6 - 04.5 %   MCV 101.8 (*) 78.0 - 100.0 fL   MCH 32.8  26.0 - 34.0 pg   MCHC 32.3  30.0 - 36.0 g/dL   RDW 40.9  81.1 - 91.4 %   Platelets 143 (*) 150 - 400 K/uL  COMPREHENSIVE METABOLIC PANEL      Result Value Ref Range   Sodium 134 (*) 137 - 147 mEq/L   Potassium 2.8 (*) 3.7 - 5.3 mEq/L   Chloride 91 (*) 96 - 112 mEq/L   CO2 25  19 - 32 mEq/L   Glucose, Bld 211 (*) 70 - 99 mg/dL   BUN  14  6 - 23 mg/dL   Creatinine, Ser 7.82 (*) 0.50 - 1.10 mg/dL   Calcium 8.9  8.4 - 95.6 mg/dL   Total Protein 9.2 (*) 6.0 - 8.3 g/dL   Albumin 3.7  3.5 - 5.2 g/dL   AST 21  0 - 37 U/L   ALT 17  0 - 35 U/L   Alkaline Phosphatase 158 (*) 39 - 117 U/L   Total Bilirubin 0.5  0.3 - 1.2 mg/dL   GFR calc non Af Amer 9 (*) >90 mL/min   GFR calc Af Amer 10 (*) >90 mL/min      EKG Interpretation    Date/Time:  Saturday October 14 2013 18:04:56 EST Ventricular Rate:  67 PR Interval:  144 QRS Duration: 107 QT Interval:  434 QTC Calculation: 458 R Axis:   64 Text Interpretation:  Sinus rhythm Atrial premature complexes in couplets No significant change since last tracing Confirmed by Veda Arrellano  MD, Rosmary Dionisio ([id number]) on 10/14/2013 6:39:01 PM            MDM  Iv ns. Labs. Monitor.  Reviewed nursing notes and prior charts for additional history.   250 cc ns bolus.  Po fluids. Zofran.  Pt notes symptoms improved/resolved.  No recurrent nv. No pain. abd soft nt.  k low however hd patient, just had hd  - discussed w pt, and  will have them alert their renal team/doc prior to next hd.  2040 cxr pending - signed out to PA Dammen/Dr J, if cxr neg, and pt continues asymptomatic/improved, may d/c to home w close follow up at next hd.       Suzi Roots, MD 10/14/13 2042

## 2013-10-14 NOTE — ED Notes (Signed)
Pt was at dialysis today. Pt began feeling nauseated and vomited after the dialysis was completed. Pt also began feeling weak. Pt states that the last time she felt like this after dialysis too much fluid was pulled off. Family wanted the patient checked out. 126/40 HR- 72 96% RA 18 RR NSR for EMS

## 2013-10-14 NOTE — ED Notes (Signed)
Pt given cup of water. Pt denies nausea.

## 2013-10-14 NOTE — ED Notes (Signed)
Report called to floor

## 2013-10-14 NOTE — Progress Notes (Signed)
ANTIBIOTIC CONSULT NOTE - FOLLOW UP  Pharmacy Consult for Cefepime (already on vanco) Indication: rule out pneumonia  Allergies  Allergen Reactions  . Aspirin     Upset stomach (pt takes low dose at home)  . Codeine Sulfate Other (See Comments)    unknown  . Meperidine Hcl     REACTION: vomiting    Vital Signs: Temp: 98.7 F (37.1 C) (02/21 2328) Temp src: Oral (02/21 2328) BP: 128/55 mmHg (02/21 2328) Pulse Rate: 81 (02/21 2328)  Labs:  Recent Labs  10/14/13 1801  WBC 14.7*  HGB 11.1*  PLT 143*  CREATININE 4.27*   Estimated Creatinine Clearance: 9.4 ml/min (by C-G formula based on Cr of 4.27). No results found for this basename: VANCOTROUGH, VANCOPEAK, VANCORANDOM, GENTTROUGH, GENTPEAK, GENTRANDOM, TOBRATROUGH, TOBRAPEAK, TOBRARND, AMIKACINPEAK, AMIKACINTROU, AMIKACIN,  in the last 72 hours   Microbiology: No results found for this or any previous visit (from the past 720 hour(s)).  Anti-infectives   Start     Dose/Rate Route Frequency Ordered Stop   10/17/13 1800  ceFEPIme (MAXIPIME) 2 g in dextrose 5 % 50 mL IVPB     2 g 100 mL/hr over 30 Minutes Intravenous Every T-Th-Sa (1800) 10/14/13 2358     10/14/13 2230  ceFEPIme (MAXIPIME) 1 g in dextrose 5 % 50 mL IVPB     1 g 100 mL/hr over 30 Minutes Intravenous  Once 10/14/13 2147 10/14/13 2311   10/14/13 2200  vancomycin (VANCOCIN) 1,500 mg in sodium chloride 0.9 % 500 mL IVPB     1,500 mg 250 mL/hr over 120 Minutes Intravenous  Once 10/14/13 2158        Assessment: 80 y/o F with ESRD on HD TTS already started on vancomycin ( as well as received Cefepime x 1) to continue on cefepime.   Goal of Therapy:  Clinical resolution   Plan:  -Cefepime 2g IV TTS at 1800 -Adjust as needed if HD schedule changes -Vancomycin per previous note  Abran DukeLedford, Carrol Bondar 10/14/2013,11:59 PM

## 2013-10-15 ENCOUNTER — Encounter (HOSPITAL_COMMUNITY): Payer: Self-pay | Admitting: *Deleted

## 2013-10-15 DIAGNOSIS — J189 Pneumonia, unspecified organism: Principal | ICD-10-CM

## 2013-10-15 DIAGNOSIS — E1165 Type 2 diabetes mellitus with hyperglycemia: Secondary | ICD-10-CM

## 2013-10-15 DIAGNOSIS — IMO0001 Reserved for inherently not codable concepts without codable children: Secondary | ICD-10-CM

## 2013-10-15 DIAGNOSIS — E876 Hypokalemia: Secondary | ICD-10-CM

## 2013-10-15 DIAGNOSIS — N184 Chronic kidney disease, stage 4 (severe): Secondary | ICD-10-CM

## 2013-10-15 LAB — RENAL FUNCTION PANEL
Albumin: 3.3 g/dL — ABNORMAL LOW (ref 3.5–5.2)
BUN: 22 mg/dL (ref 6–23)
CO2: 22 mEq/L (ref 19–32)
Calcium: 8.9 mg/dL (ref 8.4–10.5)
Chloride: 96 mEq/L (ref 96–112)
Creatinine, Ser: 5.94 mg/dL — ABNORMAL HIGH (ref 0.50–1.10)
GFR calc non Af Amer: 6 mL/min — ABNORMAL LOW (ref >90)
GFR, EST AFRICAN AMERICAN: 7 mL/min — AB (ref >90)
GLUCOSE: 165 mg/dL — AB (ref 70–99)
PHOSPHORUS: 3.5 mg/dL (ref 2.3–4.6)
Potassium: 5.4 mEq/L — ABNORMAL HIGH (ref 3.7–5.3)
Sodium: 136 mEq/L — ABNORMAL LOW (ref 137–147)

## 2013-10-15 LAB — GLUCOSE, CAPILLARY
GLUCOSE-CAPILLARY: 130 mg/dL — AB (ref 70–99)
Glucose-Capillary: 189 mg/dL — ABNORMAL HIGH (ref 70–99)
Glucose-Capillary: 109 mg/dL — ABNORMAL HIGH (ref 70–99)
Glucose-Capillary: 154 mg/dL — ABNORMAL HIGH (ref 70–99)

## 2013-10-15 LAB — CBC
HEMATOCRIT: 32.7 % — AB (ref 36.0–46.0)
HEMOGLOBIN: 10.5 g/dL — AB (ref 12.0–15.0)
MCH: 32.7 pg (ref 26.0–34.0)
MCHC: 32.1 g/dL (ref 30.0–36.0)
MCV: 101.9 fL — ABNORMAL HIGH (ref 78.0–100.0)
Platelets: 147 10*3/uL — ABNORMAL LOW (ref 150–400)
RBC: 3.21 MIL/uL — AB (ref 3.87–5.11)
RDW: 14.7 % (ref 11.5–15.5)
WBC: 8.4 10*3/uL (ref 4.0–10.5)

## 2013-10-15 LAB — MRSA PCR SCREENING: MRSA by PCR: POSITIVE — AB

## 2013-10-15 MED ORDER — GLUCOSE 40 % PO GEL
ORAL | Status: AC
  Filled 2013-10-15: qty 1

## 2013-10-15 MED ORDER — MUPIROCIN 2 % EX OINT
1.0000 "application " | TOPICAL_OINTMENT | Freq: Two times a day (BID) | CUTANEOUS | Status: DC
  Administered 2013-10-15 – 2013-10-16 (×3): 1 via NASAL
  Filled 2013-10-15: qty 22

## 2013-10-15 MED ORDER — GLUCOSE 40 % PO GEL
1.0000 | ORAL | Status: DC | PRN
  Administered 2013-10-15: 37.5 g via ORAL

## 2013-10-15 MED ORDER — CHLORHEXIDINE GLUCONATE CLOTH 2 % EX PADS
6.0000 | MEDICATED_PAD | Freq: Every day | CUTANEOUS | Status: DC
  Administered 2013-10-16: 6 via TOPICAL

## 2013-10-15 NOTE — Progress Notes (Signed)
Attending Addendum  I examined the patient and discussed the assessment and plan with Dr. Piloto. IAviva Signs have reviewed the note and agree.  Please see my addendum to the H&P for today's A&P.     Dessa PhiFUNCHES,Loriana Samad, MD FAMILY MEDICINE TEACHING SERVICE

## 2013-10-15 NOTE — H&P (Signed)
Attending Addendum  I examined the patient and discussed the assessment and plan with Dr. Adriana Simasook. I have reviewed the note and agree.  Briefly, 80 yo F with ESRD-DD, DM2 admitted for nausea, vomiting and presyncope following HD yesterday. Patient has no known sick contacts but dose go to dialysis TThSat. She has been afebrile. She reports improvement with IV fluids antibiotics. She denies CP, cough, N/V, abdominal pain this AM.   BP 112/60  Pulse 76  Temp(Src) 98.4 F (36.9 C) (Oral)  Resp 17  Ht 5\' 4"  (1.626 m)  Wt 155 lb 3.3 oz (70.4 kg)  BMI 26.63 kg/m2  SpO2 96% General appearance: alert, cooperative and no distress, sitting in bed, well appearing.  Lungs: normal WOB, LLL crackles. no wheezing.  Heart: regular rate and rhythm, S1, S2 normal, no murmur, click, rub or gallop Abdomen: soft, NT, ND  Lab Results  Component Value Date   WBC 8.4 10/15/2013   HGB 10.5* 10/15/2013   HCT 32.7* 10/15/2013   MCV 101.9* 10/15/2013   PLT 147* 10/15/2013   A/P:  80 yo F admitted with acute onset N/V following HD found to have LLL infiltrate on CXR and crackles on exam.  1. HCAP:  Based on CXR and PE. No symptoms or O2 requirement, CP,SOB or cough. Covered with Vanc and Cefepime. Transition to oral therapy once cultures neg x 48 hrs  2. N/V and weakness following HD: resolved. Suspect self limited disease. Neg UCx to date.   3. Dispo: likely to home tomorrow on oral antibiotics. Will get PT/OT eval to assess any home health needs.    Dessa PhiFUNCHES,Taaj Hurlbut, MD FAMILY MEDICINE TEACHING SERVICE

## 2013-10-15 NOTE — Progress Notes (Signed)
New Admission Note:   Arrival Method: Via Stretcher from the ED Mental Orientation:  Pleasant, Calm, Cooperative, Alert to person, place, and situation.  Slight memory impairment Telemetry: Tele Box 06 - NSR Assessment: Completed Skin: Intact but dry and flaky IV: Right hand PIV Pain: Denies Tubes: None Safety Measures:   Considered high fall risk and placed on bed alarm, yellow socks, and yellow armband Admission: Completed [address] Orientation: Patient has been orientated to the room, unit and staff.  Family: Private Health Aid accompanied patient to the room.  Her name is Claudia SalvageDebra Rich [facility](Tele # [phone]).  Per niece, Claudia StackOlivia Rich, (healthcare POA), she did take the patient's purse and wallet home.  The patient had a life alert necklace that was placed in the room closet with her clothes.  She has a watch to her left wrist.  She has upper and lower dentures at bedside.  Her cell phone (red in color) is at bedside.  I did advise family that we are not responsible for valuables.  They understood.  I also talked with niece over the phone and completed admission history.  She states the patient's dry weight should be 71.5 kg.  The patient utilized private health care aides for several hours a day to help with medications, cooks meals, and shop.  Otherwise, the patient lives by her self and is fairly independent.  A password was established for release of information.  This password is "[place]".    Orders have been reviewed and implemented. Will continue to monitor the patient. Call light has been placed within reach and bed alarm has been activated.   Claudia CoveyKimberly Shadee Montoya RN- Marsa ArisBC, [place] Phone number: [phone]

## 2013-10-15 NOTE — Progress Notes (Signed)
Hypoglycemic Event  CBG:66  Treatment: 8 oz of apple juice and 37.5 gram of gluco gel  Symptoms: none  Follow-up CBG: Time:1650 CBG Result:109  Possible Reasons for Event: unknown  Comments/MD notified:yes    Morelia Cassells, Kem Kayslbert Tolentino  Remember to initiate Hypoglycemia Order Set & complete

## 2013-10-15 NOTE — Progress Notes (Signed)
Family Medicine Teaching Service Daily Progress Note Intern Pager: [phone]  Patient name: Claudia Rich Call Medical record number: [id number] Date of birth: [birth year] Age: 80 y.o. Gender: female  Primary Care Provider: Lillia AbedPILOTO, DAYARMYS, MD Consultants: Renal for dialysis.  Code Status: full  Pt Overview and Major Events to Date:   Assessment and Plan: Claudia Rich Walle is a 80 y.o. female presenting with nausea and vomiting. Workup in the emergency department revealed hypokalemia and right middle lobe and right lower lobe infiltrate. PMH is significant for ESRD on HD T-Th-Sa, CAD, COPD, Stroke, DM-2, HLD, and HTN.   HCAP - patient with infiltrate on x-ray. Given patient is ESRD on hemodialysis and in health care setting frequently, treating as a HCAP.  - Vanc and Cefepime per pharm IVfor 1 more day the will consider transition to Levaquin.  - Monitor closely   Nausea and vomiting - likely secondary to underlying illness and potentially from HD.  - No additional fluids at this time given ESRD; PO intake - Clear liquid diet  - PRN Zofran for nausea/vomiting   Hypokalemia  - K-Dur 40 mEq x 3  - awaiting results of Renal panel in the am   ESRD - HD T-Th-Sa  - Completed dialysis today.  - No current indications for acute dialysis  - Continuing home Phoslo   CAD, HTN, HLD  - Continuing home Metoprolol, aspirin, hold amlodipine for borderline low BP, Lipitor  - continue monitoring  H/o Stroke  - ASA 81 mg daily  - Lipitor 80  COPD  - No evidence of exacerbation currently. Continuing home albuterol and Spiriva.  FEN/GI: Clear liquid diet  Prophylaxis: Heparin   Disposition: Pending clinical improvement.   Subjective:  Feeling better today . Afebrile. Was able to have breakfast.    Objective: Temp:  [97.4 F (36.3 C)-99 F (37.2 C)] 98.4 F (36.9 C) (02/22 0900) Pulse Rate:  [73-85] 76 (02/22 0900) Resp:  [13-24] 17 (02/22 0900) BP: (112-142)/(46-66) 112/60 mmHg (02/22  0900) SpO2:  [96 %-100 %] 96 % (02/22 0900) Weight:  [155 lb 3.3 oz (70.4 kg)] 155 lb 3.3 oz (70.4 kg) (02/21 2328) Physical Exam: General: well appearing, elderly female, resting in bed, no acute distress.  HEENT: normal MM Cardiovascular: RRR. No murmur appreciated.  Respiratory: Rich basilar and RML crackles noted.  Abdomen: soft, nontender, nondistended. No guarding or rebound.  Extremities: No LE edema noted. Access: L AV fistula - no erythema. Palpable thrill.  Skin: warm, dry, intact  Neuro: patient with memory impairment and word finding difficulties. No focal neurological deficits.   Laboratory:  Recent Labs Lab 10/14/13 1801 10/15/13 0836  WBC 14.7* 8.4  HGB 11.1* 10.5*  HCT 34.4* 32.7*  PLT 143* 147*    Recent Labs Lab 10/14/13 1801  NA 134*  K 2.8*  CL 91*  CO2 25  BUN 14  CREATININE 4.27*  CALCIUM 8.9  PROT 9.2*  BILITOT 0.5  ALKPHOS 158*  ALT 17  AST 21  GLUCOSE 211*   Imaging/Diagnostic Tests: CXR 2/21 IMPRESSION:  1. Pneumonia suspected in the right lower lobe and in the right middle lobe, superimposed upon the right lower lobe bronchiectasis previously identified. Stable left lower lobe bronchiectasis without superimposed opacity.  2. Stable moderate cardiomegaly without evidence of pulmonary edema.  Dayarmys Piloto de Criselda PeachesLa Paz, MD 10/15/2013, 10:51 AM PGY-3, [facility] FPTS Intern pager: [phone], text pages welcome

## 2013-10-15 NOTE — ED Provider Notes (Signed)
Medical screening examination/treatment/procedure(s) were performed by non-physician practitioner and as supervising physician I was immediately available for consultation/collaboration.     Doug SouSam Damarie Schoolfield, MD 10/15/13 [id number]

## 2013-10-16 LAB — GLUCOSE, CAPILLARY
GLUCOSE-CAPILLARY: 88 mg/dL (ref 70–99)
Glucose-Capillary: 233 mg/dL — ABNORMAL HIGH (ref 70–99)
Glucose-Capillary: 76 mg/dL (ref 70–99)

## 2013-10-16 LAB — RENAL FUNCTION PANEL
Albumin: 3.1 g/dL — ABNORMAL LOW (ref 3.5–5.2)
BUN: 38 mg/dL — ABNORMAL HIGH (ref 6–23)
CALCIUM: 9.5 mg/dL (ref 8.4–10.5)
CHLORIDE: 97 meq/L (ref 96–112)
CO2: 21 mEq/L (ref 19–32)
Creatinine, Ser: 8.25 mg/dL — ABNORMAL HIGH (ref 0.50–1.10)
GFR calc non Af Amer: 4 mL/min — ABNORMAL LOW (ref >90)
GFR, EST AFRICAN AMERICAN: 5 mL/min — AB (ref >90)
GLUCOSE: 85 mg/dL (ref 70–99)
Phosphorus: 3.5 mg/dL (ref 2.3–4.6)
Potassium: 5.3 mEq/L (ref 3.7–5.3)
Sodium: 135 mEq/L — ABNORMAL LOW (ref 137–147)

## 2013-10-16 MED ORDER — LEVOFLOXACIN 750 MG PO TABS
750.0000 mg | ORAL_TABLET | Freq: Once | ORAL | Status: AC
  Administered 2013-10-16: 750 mg via ORAL
  Filled 2013-10-16: qty 1

## 2013-10-16 MED ORDER — LEVOFLOXACIN 500 MG PO TABS: 500.0000 mg | ORAL_TABLET | ORAL | Status: DC

## 2013-10-16 MED ORDER — LEVOFLOXACIN 500 MG PO TABS: 500.0000 mg | ORAL_TABLET | ORAL | Status: AC

## 2013-10-16 NOTE — Evaluation (Signed)
Physical Therapy Evaluation Patient Details Name: Claudia Rich MRN: [id number] DOB: [birth year] Today's Date: 10/16/2013 Time: [id number] PT Time Calculation (min): 19 min  PT Assessment / Plan / Recommendation History of Present Illness  Claudia Rich is a 80 y.o. female presenting with nausea and vomiting.  Workup in the emergency department revealed hypokalemia and right middle lobe and right lower lobe infiltrate.  PMH is significant for ESRD on HD T-Th-Sa  Clinical Impression  Pt functioning near baseline. Pt demo'd increased safety and stability with use of RW. Recommended to pt she use her RW at home and when HHPT services come they can work with her to progress to cane or off RW. Pt agreed. Acute PT to con't to follow.    PT Assessment  Patient needs continued PT services    Follow Up Recommendations  Home health PT    Does the patient have the potential to tolerate intense rehabilitation      Barriers to Discharge Decreased caregiver support      Equipment Recommendations  None recommended by PT    Recommendations for Other Services     Frequency Min 3X/week    Precautions / Restrictions Precautions Precautions: Fall Restrictions Weight Bearing Restrictions: No   Pertinent Vitals/Pain Denies pain      Mobility  Bed Mobility Overal bed mobility:  (pt up in chair upon PT arrival) Transfers Overall transfer level: Needs assistance Equipment used: 1 person hand held assist Transfers: Sit to/from Stand Sit to Stand: Min guard;Supervision General transfer comment: increased time, definite use of hands Ambulation/Gait Ambulation/Gait assistance: Min guard;Supervision Ambulation Distance (Feet): 200 Feet Assistive device: Rolling walker (2 wheeled);None General Gait Details: pt very guarded and reaching for objects to hold onto when amb without RW. pt with improved stability and fluidity in gait pattern when using RW. recommend use of RW    Exercises     PT  Diagnosis: Difficulty walking  PT Problem List: Decreased strength;Decreased activity tolerance;Decreased balance;Decreased mobility PT Treatment Interventions: DME instruction;Gait training;Stair training;Functional mobility training;Therapeutic activities;Therapeutic exercise;Balance training     PT Goals(Current goals can be found in the care plan section) Acute Rehab PT Goals Patient Stated Goal: go home today PT Goal Formulation: With patient Time For Goal Achievement: 10/23/13 Potential to Achieve Goals: Good  Visit Information  Last PT Received On: 10/16/13 Assistance Needed: +1 History of Present Illness: Claudia Rich is a 80 y.o. female presenting with nausea and vomiting.  Workup in the emergency department revealed hypokalemia and right middle lobe and right lower lobe infiltrate.  PMH is significant for ESRD on HD T-Th-Sa       Prior Functioning  Home Living Family/patient expects to be discharged to:: Private residence Living Arrangements: Alone Available Help at Discharge: Personal care attendant Home Access: Stairs to enter Entrance Stairs-Number of Steps: 2 Entrance Stairs-Rails: None Home Layout: One level Home Equipment: Environmental consultantWalker - 2 wheels;Bedside commode;Cane - single point Additional Comments: pt stats she has many friends from church that helps her out Prior Function Level of Independence: Independent Comments: has RW and cane but doesn't use them Communication Communication: No difficulties Dominant Hand: Right    Cognition  Cognition Arousal/Alertness: Awake/alert Behavior During Therapy: WFL for tasks assessed/performed Overall Cognitive Status: Within Functional Limits for tasks assessed    Extremity/Trunk Assessment Upper Extremity Assessment Upper Extremity Assessment: Overall WFL for tasks assessed Lower Extremity Assessment Lower Extremity Assessment: Overall WFL for tasks assessed Cervical / Trunk Assessment Cervical / Trunk Assessment:  Kyphotic   Balance Balance Overall balance assessment: Needs assistance Sitting-balance support: No upper extremity supported;Feet supported Sitting balance-Leahy Scale: Normal Standing balance support: No upper extremity supported Standing balance-Leahy Scale: Fair Standing balance comment: pt unsteady reaching for something to hold onto  End of Session PT - End of Session Equipment Utilized During Treatment: Gait belt Activity Tolerance: Patient tolerated treatment well Patient left: in chair;with call [place]/phone within reach Nurse Communication: Mobility status  GP     Marcene Brawn 10/16/2013, 5:08 PM   Lewis Shock, PT, DPT Pager #: [phone] Office #: [phone]

## 2013-10-16 NOTE — Progress Notes (Signed)
Family Medicine Teaching Service Daily Progress Note Intern Pager: [phone]  Patient name: Claudia Rich Medical record number: [id number] Date of birth: [birth year] Age: 80 y.o. Gender: female  Primary Care Provider: Lillia AbedPILOTO, DAYARMYS, MD Consultants: Renal for dialysis.  Code Status: full  Pt Overview and Major Events to Date:   Assessment and Plan: Claudia Rich is a 80 y.o. female presenting with nausea and vomiting. Workup in the emergency department revealed hypokalemia and right middle lobe and right lower lobe infiltrate. PMH is significant for ESRD on HD T-Th-Sa, CAD, COPD, Stroke, DM-2, HLD, and HTN.   HCAP - Improved  - patient with infiltrate on x-ray. Given patient is ESRD on hemodialysis and in health care setting frequently, treating as a HCAP.  - significant clinical improvement, afebrile x 48 hours, improved cough, no O2 req, good response to IV abx - Transition to Levaquin PO today (750mg  today, then 500mg  q 48 hr for renal dosing), total 14 days (last dose 10/27/13) - DC'd Vanc and Cefepime per pharm IV  Nausea and vomiting - Resolved - likely secondary to underlying illness and potentially from HD.  - No additional fluids at this time given ESRD; PO intake - Clear liquid diet  - PRN Zofran for nausea/vomiting   Hypokalemia - Resolved - K improved, 2.8-->5.4-->5.3 - s/p K-Dur 40 mEq x 3    ESRD - HD T-Th-Sa  - Completed dialysis on Saturday.  - No current indications for acute dialysis  - Continuing home Phoslo   CAD, HTN, HLD  - Continuing home Metoprolol, aspirin, hold amlodipine for borderline low BP, Lipitor  - continue monitoring  H/o Stroke  - ASA 81 mg daily  - Lipitor 80  COPD  - No evidence of exacerbation currently. Continuing home albuterol and Spiriva.  FEN/GI: Clear liquid diet  Prophylaxis: Heparin   Disposition: Significant clinical improvement in HCAP, expect to be discharged today on PO antibiotics to complete course.  Subjective:   No  acute events overnight. Continues to feel better. Improved cough. Denies fever/chills, chest pain. Resolved nausea / vomiting (no episodes since hospitalized). Tolerating regular diet, ambulate to bathroom. Agreeable to going home today. No other complaints.  Objective: Temp:  [97.9 F (36.6 C)-99.8 F (37.7 C)] 97.9 F (36.6 C) (02/23 0800) Pulse Rate:  [64-77] 64 (02/23 0800) Resp:  [18] 18 (02/23 0800) BP: (117-135)/(46-56) 132/48 mmHg (02/23 0800) SpO2:  [97 %-99 %] 97 % (02/23 0800) Weight:  [155 lb 3.3 oz (70.401 kg)] 155 lb 3.3 oz (70.401 kg) (02/22 2038) Physical Exam: General: well appearing, elderly female, resting in bed, no acute distress.  HEENT: normal MM Cardiovascular: RRR. No murmur appreciated.  Respiratory: Improved air movement b/l with improved but persistent R-basilar crackles.  Abdomen: soft, nontender, nondistended. No guarding or rebound.  Extremities: No LE edema noted. Access: L AV fistula - no erythema. Palpable thrill.  Skin: warm, dry, intact  Neuro: patient with memory impairment and word finding difficulties. No focal neurological deficits.   Laboratory:  Recent Labs Lab 10/14/13 1801 10/15/13 0836  WBC 14.7* 8.4  HGB 11.1* 10.5*  HCT 34.4* 32.7*  PLT 143* 147*    Recent Labs Lab 10/14/13 1801 10/15/13 0500 10/16/13 0650  NA 134* 136* 135*  K 2.8* 5.4* 5.3  CL 91* 96 97  CO2 25 22 21   BUN 14 22 38*  CREATININE 4.27* 5.94* 8.25*  CALCIUM 8.9 8.9 9.5  PROT 9.2*  --   --   BILITOT 0.5  --   --  ALKPHOS 158*  --   --   ALT 17  --   --   AST 21  --   --   GLUCOSE 211* 165* 85   Imaging/Diagnostic Tests: CXR 2/21 IMPRESSION:  1. Pneumonia suspected in the right lower lobe and in the right middle lobe, superimposed upon the right lower lobe bronchiectasis previously identified. Stable left lower lobe bronchiectasis without superimposed opacity.  2. Stable moderate cardiomegaly without evidence of pulmonary edema.  Saralyn Pilar, DO 10/16/2013, 12:10 PM PGY-1, [facility] Family Medicine FPTS Intern pager: [phone], text pages welcome

## 2013-10-16 NOTE — Progress Notes (Signed)
Discharge summary reviewed with pt and discussed with pt's POA (her niece) via telephone; understanding verbalized from pt and pt's niece. Allowed time for questions and further explanation. IV removed. Assistance given to aide in dressing pt; pt awaiting pick up by a family member.

## 2013-10-16 NOTE — Discharge Instructions (Signed)
Return to ER right away if worse, weak/faint, persistent vomiting, trouble breathing, fevers, other concern.    Pneumonia, Adult Pneumonia is an infection of the lungs.  CAUSES Pneumonia may be caused by bacteria or a virus. Usually, these infections are caused by breathing infectious particles into the lungs (respiratory tract). SYMPTOMS   Cough.  Fever.  Chest pain.  Increased rate of breathing.  Wheezing.  Mucus production. DIAGNOSIS  If you have the common symptoms of pneumonia, your caregiver will typically confirm the diagnosis with a chest X-ray. The X-ray will show an abnormality in the lung (pulmonary infiltrate) if you have pneumonia. Other tests of your blood, urine, or sputum may be done to find the specific cause of your pneumonia. Your caregiver may also do tests (blood gases or pulse oximetry) to see how well your lungs are working. TREATMENT  Some forms of pneumonia may be spread to other people when you cough or sneeze. You may be asked to wear a mask before and during your exam. Pneumonia that is caused by bacteria is treated with antibiotic medicine. Pneumonia that is caused by the influenza virus may be treated with an antiviral medicine. Most other viral infections must run their course. These infections will not respond to antibiotics.  PREVENTION A pneumococcal shot (vaccine) is available to prevent a common bacterial cause of pneumonia. This is usually suggested for:  People over 78 years old.  Patients on chemotherapy.  People with chronic lung problems, such as bronchitis or emphysema.  People with immune system problems. If you are over 65 or have a high risk condition, you may receive the pneumococcal vaccine if you have not received it before. In some countries, a routine influenza vaccine is also recommended. This vaccine can help prevent some cases of pneumonia.You may be offered the influenza vaccine as part of your care. If you smoke, it is time to  quit. You may receive instructions on how to stop smoking. Your caregiver can provide medicines and counseling to help you quit. HOME CARE INSTRUCTIONS   Cough suppressants may be used if you are losing too much rest. However, coughing protects you by clearing your lungs. You should avoid using cough suppressants if you can.  Your caregiver may have prescribed medicine if he or she thinks your pneumonia is caused by a bacteria or influenza. Finish your medicine even if you start to feel better.  Your caregiver may also prescribe an expectorant. This loosens the mucus to be coughed up.  Only take over-the-counter or prescription medicines for pain, discomfort, or fever as directed by your caregiver.  Do not smoke. Smoking is a common cause of bronchitis and can contribute to pneumonia. If you are a smoker and continue to smoke, your cough may last several weeks after your pneumonia has cleared.  A cold steam vaporizer or humidifier in your room or home may help loosen mucus.  Coughing is often worse at night. Sleeping in a semi-upright position in a recliner or using a couple pillows under your head will help with this.  Get rest as you feel it is needed. Your body will usually let you know when you need to rest. SEEK IMMEDIATE MEDICAL CARE IF:   Your illness becomes worse. This is especially true if you are elderly or weakened from any other disease.  You cannot control your cough with suppressants and are losing sleep.  You begin coughing up blood.  You develop pain which is getting worse or is uncontrolled with  medicines.  You have a fever.  Any of the symptoms which initially brought you in for treatment are getting worse rather than better.  You develop shortness of breath or chest pain. MAKE SURE YOU:   Understand these instructions.  Will watch your condition.  Will get help right away if you are not doing well or get worse. Document Released: 08/10/2005 Document  Revised: 11/02/2011 Document Reviewed: 10/30/2010 [facility] Patient Information 2014 [place], [place].      Hemodialysis Dialysis is a procedure that replaces some of the work that healthy kidneys do. It is done when you lose about 85 90% of your kidney function and sometimes earlier if your symptoms may be improved by dialysis. During dialysis, wastes, salt, and extra water are removed from the blood; and the level of certain chemicals in the blood (such as potassium) is maintained. Hemodialysis is a type of dialysis in which a machine called a dialyzer is used to filter the blood. Hemodialysis is usually performed by a caregiver at a hospital or dialysis center 3 times a week for 3 5 hours during each visit. It may also be performed more frequently and for longer periods of time at home with training. LET YOUR CAREGIVER KNOW ABOUT:  Any allergies you have.  All medications you are taking, including vitamins, herbs, eyedrops, and over-the-counter medications and creams.  Any blood disorders you have had.  Other health problems you have. RISKS AND COMPLICATIONS Generally, hemodialysis is a safe procedure. However, as with any procedure, complications can occur. Possible complications include:  Low blood pressure (hypotension).  Narrowing or ballooning of a blood vessel or bleeding at the site where blood is removed from the body and returned to the body (vascular access).  An infection or blockage at the vascular access site. BEFORE THE PROCEDURE Before having hemodialysis for the first time, you will need surgery to create a site where blood can be removed from the body and returned to the body (vascular access). There are three types of vascular accesses:  Arteriovenous fistula. This type of access is created when an artery and a vein (usually in the arm) are connected during surgery. The arteriovenous fistula takes 1 6 months to develop after surgery.  Arteriovenous graft. This type  of access is created when an artery and a vein in the arm are connected during surgery with a tube. An arteriovenous graft can be used within 2 3 weeks of surgery.  A venous catheter. To create this type of access, a thin, flexible tube (catheter) is placed in a large vein in your neck, chest, or groin. A venous catheter can be used right away. PROCEDURE  Hemodialysis is performed while you are sitting or reclining. During the procedure, you may sleep, read, watch television, or perform any other tasks that can be done while you are in this position. If you experience side effects or any other discomfort during the procedure, let your caregiver know. He or she may be able to make adjustments to help you feel more comfortable. Your hemodialysis process may look something like this: 1. Your weight, blood pressure, pulse, and temperature will be measured. 2. The skin around your vascular access will be sterilized. 3. Your vascular access will be connected to the dialyzer. If your access is a fistula or graft, two needles will be inserted through the vascular access. The needles will be connected to a plastic tube that is connected to the dialyzer. They will be taped to your skin so that  they do not move out of place. If your access is a catheter, it will be connected to a plastic tube that is connected to the dialyzer. 4. The dialysis machine will be turned on. This will cause your blood to flow to the dialyzer. The dialyzer will filter and clean your blood before returning it to your body. While the dialysis machine is working, your blood pressure and pulse will be checked several times. 5. Once dialysis is complete, your vascular access will be disconnected from the dialyzer. If your access is a fistula or graft, the needles will be removed and a bandage (dressing) will be placed over the access. AFTER THE PROCEDURE  Your weight will be measured.  Your blood may be tested to see whether your treatments  are removing enough wastes. This is usually done once a month.  You may experience or continue to experience side effects, including:  Dizziness.  Muscle cramps.  Nausea.  Headaches.  Feeling tired. Energy levels usually return to normal the day after the procedure.  Itchiness. Your caregiver may be able to prescribe medication to relieve it.  Achy or jittery legs. You may feel like kicking your legs. This sometimes causes sleeping problems.  Allergic reaction to the chemicals used to sterilize the dialyzer. Document Released: 12/05/2012 Document Reviewed: 12/05/2012 [facility] Patient Information 2014 [place], [place].    Hypokalemia Hypokalemia means that the amount of potassium in the blood is lower than normal.Potassium is a chemical, called an electrolyte, that helps regulate the amount of fluid in the body. It also stimulates muscle contraction and helps nerves function properly.Most of the body's potassium is inside of cells, and only a very small amount is in the blood. Because the amount in the blood is so small, minor changes can be life-threatening. CAUSES  Antibiotics.  Diarrhea or vomiting.  Using laxatives too much, which can cause diarrhea.  Chronic kidney disease.  Water pills (diuretics).  Eating disorders (bulimia).  Low magnesium level.  Sweating a lot. SIGNS AND SYMPTOMS  Weakness.  Constipation.  Fatigue.  Muscle cramps.  Mental confusion.  Skipped heartbeats or irregular heartbeat (palpitations).  Tingling or numbness. DIAGNOSIS  Your health care provider can diagnose hypokalemia with blood tests. In addition to checking your potassium level, your health care provider may also check other lab tests. TREATMENT Hypokalemia can be treated with potassium supplements taken by mouth or adjustments in your current medicines. If your potassium level is very low, you may need to get potassium through a vein (IV) and be monitored in the  hospital. A diet high in potassium is also helpful. Foods high in potassium are:  Nuts, such as peanuts and pistachios.  Seeds, such as sunflower seeds and pumpkin seeds.  Peas, lentils, and lima beans.  Whole grain and bran cereals and breads.  Fresh fruit and vegetables, such as apricots, avocado, bananas, cantaloupe, kiwi, oranges, tomatoes, asparagus, and potatoes.  Orange and tomato juices.  Red meats.  Fruit yogurt. HOME CARE INSTRUCTIONS  Take all medicines as prescribed by your health care provider.  Maintain a healthy diet by including nutritious food, such as fruits, vegetables, nuts, whole grains, and lean meats.  If you are taking a laxative, be sure to follow the directions on the label. SEEK MEDICAL CARE IF:  Your weakness gets worse.  You feel your heart pounding or racing.  You are vomiting or having diarrhea.  You are diabetic and having trouble keeping your blood glucose in the normal range. SEEK IMMEDIATE  MEDICAL CARE IF:  You have chest pain, shortness of breath, or dizziness.  You are vomiting or having diarrhea for more than 2 days.  You faint. MAKE SURE YOU:   Understand these instructions.  Will watch your condition.  Will get help right away if you are not doing well or get worse. Document Released: 08/10/2005 Document Revised: 05/31/2013 Document Reviewed: 02/10/2013 [facility] Patient Information 2014 [place], [place].

## 2013-10-16 NOTE — Discharge Summary (Signed)
Family Medicine Teaching [facility] Discharge Summary  Patient name: Claudia Rich R Frech Medical record number: [id number] Date of birth: 10-Jan-1927 Age: 80 y.o. Gender: female Date of Admission: 10/14/2013  Date of Discharge: 10/16/13 Admitting Physician: Lora PaulaJosalyn C Funches, MD  Primary Care Provider: Lillia AbedPILOTO, DAYARMYS, MD Consultants: Renal (for HD)  Indication for Hospitalization: Nausea / Vomiting following HD, Hypokalemia, Pneumonia  Discharge Diagnoses/Problem List:  RLL HCAP, in setting of chronic hemodialysis - Improved Nausea and Vomiting - Resolved Hypokalemia - Resolved ESRD, chronic hemodialysis (T-Th-Sat) CAD, HTN, HLD, H/o CVA COPD  Disposition: Home  Discharge Condition: Stable  Discharge Exam: General: well appearing, elderly female, resting in bed, no acute distress.  HEENT: normal MM  Cardiovascular: RRR. No murmur appreciated.  Respiratory: Improved air movement b/l with improved but persistent R-basilar crackles.  Abdomen: soft, nontender, nondistended. No guarding or rebound.  Extremities: No LE edema noted. Access: L AV fistula - no erythema. Palpable thrill.  Skin: warm, dry, intact  Neuro: patient with memory impairment and word finding difficulties. No focal neurological deficits.   Brief Hospital Course: Claudia Rich R Andreoli is a 80 y.o. female presenting with nausea and vomiting. Workup in the emergency department revealed hypokalemia and right middle lobe and right lower lobe infiltrate. PMH is significant for ESRD on HD T-Th-Sa, CAD, COPD, Stroke, DM-2, HLD, and HTN.  # RLL / RML HCAP, in setting of chronic hemodialysis - Improved  Admitted for n/v, found to have RLL/RML infiltrate on initial CXR in setting of b/l lower lobe bronchiectasis, considered HCAP given ESRD on HD, empirically treated with Vancomycin / Cefepime IV. Additionally WBC mildly elevated 14.7, afebrile, blood cultures collected (2/21, NGTD). Significant improvement on IV antibiotics with  afebrile x 48 hours, improved cough, no O2 req, transitioned to Levaquin PO 750mg  q 48 hr for total of 14 days (last dose 10/27/13) prior to discharge.  # Nausea and vomiting - Resolved  Presenting complaint, following HD on Thursday, initially concern for possible pre-syncopal episode, however significant improvement following IV rehydration, no further vomiting in hospital. Cautious with IVF rehydration given ESRD. Improved PO intake, tolerated diet prior to discharge.  # Hypokalemia - Resolved  Suspect hypokalemia on admission 2.8 secondary to vomiting, repletion with oral K improved to 5.4-->5.3 prior to discharge.  # ESRD - HD T-Th-Sa  Recent history, presentation following ill from HD on Thursday. In hospital, completed HD on Saturday. Return to regular schedule HD on discharge.  # CAD, HTN, HLD, h/o CVA Continued home Metoprolol, ASA 81, Lipitor. Held amlodipine for borderline low BP. No concerns during hospitalization.  # COPD  No evidence of exacerbation currently. Continuing home albuterol and Spiriva.  Issues for Follow Up: 1. HCAP - Completion Levaquin PO 750mg  q 48 hr (last dose 10/27/13) total 14 day course 2. Follow-up resolution of n/v and if still tolerating PO 3. Consider re-check K  Significant Procedures: none  Significant Labs and Imaging:   Recent Labs Lab 10/14/13 1801 10/15/13 0836  WBC 14.7* 8.4  HGB 11.1* 10.5*  HCT 34.4* 32.7*  PLT 143* 147*    Recent Labs Lab 10/14/13 1801 10/15/13 0500 10/16/13 0650  NA 134* 136* 135*  K 2.8* 5.4* 5.3  CL 91* 96 97  CO2 25 22 21   GLUCOSE 211* 165* 85  BUN 14 22 38*  CREATININE 4.27* 5.94* 8.25*  CALCIUM 8.9 8.9 9.5  PHOS  --  3.5 3.5  ALKPHOS 158*  --   --   AST 21  --   --  ALT 17  --   --   ALBUMIN 3.7 3.3* 3.1*   HgbA1c 7.0 Blood culture x 2 (collected 2/21 - NGTD x 4 days) MRSA PCR positive  2/21 EKG NSR, HR 67, some PACs, otherwise no acute ST-T wave changes, unchanged.  2/21 CXR  2v IMPRESSION:  1. Pneumonia suspected in the right lower lobe and in the right middle lobe, superimposed upon the right lower lobe bronchiectasis previously identified. Stable left lower lobe bronchiectasis without superimposed opacity.  2. Stable moderate cardiomegaly without evidence of pulmonary edema.  Results/Tests Pending at Time of Discharge:  Blood culture x 2 (collected 2/21 - NGTD x 4 days) - pending  Discharge Medications:    Medication List         albuterol 108 (90 BASE) MCG/ACT inhaler  Commonly known as:  PROVENTIL HFA;VENTOLIN HFA  Inhale 2 puffs into the lungs every 4 (four) hours as needed for wheezing.     amLODipine 2.5 MG tablet  Commonly known as:  NORVASC  Take 1 tablet (2.5 mg total) by mouth daily.     aspirin 81 MG chewable tablet  Chew 81 mg by mouth daily.     atorvastatin 80 MG tablet  Commonly known as:  LIPITOR  TAKE ONE TABLET AT BEDTIME.     calcium acetate 667 MG capsule  Commonly known as:  PHOSLO  Take 1,334 mg by mouth 2 (two) times daily.     docusate sodium 100 MG capsule  Commonly known as:  COLACE  Take 100 mg by mouth 2 (two) times daily.     fluticasone 50 MCG/ACT nasal spray  Commonly known as:  FLONASE  Place 1 spray into both nostrils daily.     glipiZIDE 10 MG tablet  Commonly known as:  GLUCOTROL  TAKE 2 TABLETS TWICE DAILY BEFORE MEALS.     levofloxacin 500 MG tablet  Commonly known as:  LEVAQUIN  Take 1 tablet (500 mg total) by mouth every other day.     loratadine 10 MG tablet  Commonly known as:  CLARITIN  Take 1 tablet (10 mg total) by mouth daily.     metoprolol tartrate 25 MG tablet  Commonly known as:  LOPRESSOR  Take 25 mg by mouth See admin instructions. Take 25 mg by mouth twice a day on non-dialysis days. Take 25 mg by mouth once per day on dialysis days.     MUCINEX 600 MG 12 hr tablet  Generic drug:  guaiFENesin  TAKE 1 TABLET TWICE DAILY.     RENAL MULTIVITAMIN FORMULA PO  Take 1 tablet by  mouth daily.     sitaGLIPtin 25 MG tablet  Commonly known as:  JANUVIA  TAKE 1 TABLET ONCE DAILY.     tiotropium 18 MCG inhalation capsule  Commonly known as:  SPIRIVA HANDIHALER  Place 1 capsule (18 mcg total) into inhaler and inhale daily.        Discharge Instructions: Please refer to Patient Instructions section of EMR for full details.  Patient was counseled important signs and symptoms that should prompt return to medical care, changes in medications, dietary instructions, activity restrictions, and follow up appointments.   Follow-Up Appointments: Follow-up Information   Follow up with Everlene Other, DO On 10/18/2013. (at 9:15am with Dr. Adriana Simas for hospital follow-up appointment)    Specialty:  [facility] Medicine   Contact information:   [address] [place] [address] [phone]       Saralyn Pilar, DO 10/18/2013, 8:02 AM  PGY-1, [place]

## 2013-10-16 NOTE — Evaluation (Signed)
Occupational Therapy Evaluation Patient Details Name: Claudia Rich MRN: [id number] DOB: Jul 25, 1927 Today's Date: 10/16/2013 Time: [id number] OT Time Calculation (min): 20 min  OT Assessment / Plan / Recommendation History of present illness Claudia Rich is a 80 y.o. female presenting with nausea and vomiting.  Workup in the emergency department revealed hypokalemia and right middle lobe and right lower lobe infiltrate.  PMH is significant for ESRD on HD T-Th-Sa   Clinical Impression   Pt is at sup/min guard A level with LB ADLs and ADL mobility with decreased functional balance with initial sit stands, pt required HHA to ambulate to bathroom and to sink for ADLs. Pt  Had private aides/personal care attendant at home and church members provide her with transportation. Educated pt on benefits of using AD for safe mobility at home. No further acute OT services indicated at this time. Awaiting PT eval for functional mobility safety    OT Assessment  Patient does not need any further OT services    Follow Up Recommendations  No OT follow up;Supervision - Intermittent    Barriers to Discharge  none    Equipment Recommendations  Tub/shower seat    Recommendations for Other Services PT consult  Frequency       Precautions / Restrictions Precautions Precautions: Fall Restrictions Weight Bearing Restrictions: No   Pertinent Vitals/Pain No c/o pain    ADL  Grooming: Performed;Wash/dry hands;Wash/dry face;Supervision/safety Where Assessed - Grooming: Supported standing Upper Body Bathing: Simulated;Set up Where Assessed - Upper Body Bathing: Unsupported sitting Lower Body Bathing: Simulated;Supervision/safety;Min guard;Set up Where Assessed - Lower Body Bathing: Unsupported sitting Upper Body Dressing: Performed;Set up Where Assessed - Upper Body Dressing: Unsupported sitting;Supported sit to stand Lower Body Dressing: Performed;Supervision/safety;Min guard;Set up Where Assessed -  Lower Body Dressing: Unsupported sitting;Supported sit to stand Toilet Transfer: Performed;Min guard;Supervision/safety Toilet Transfer Method: Sit to Barist[place]and Toilet Transfer Equipment: Regular height toilet;Grab bars Toileting - Clothing Manipulation and Hygiene: Performed;Supervision/safety Where Assessed - Engineer, miningToileting Clothing Manipulation and Hygiene: Standing Tub/Shower Transfer: Supervision/safety;Performed Tub/Shower Transfer Method: Science writerAmbulating Tub/Shower Transfer Equipment: Grab bars;Walk in shower Equipment Used: Gait belt Transfers/Ambulation Related to ADLs: pt unsteady during initial sit -stand from bed in prep for ambulation to bathroom. Pt states that she has a RW and cane at home but does not use them, holds onto furniture at times    OT Diagnosis:    OT Problem List:   OT Treatment Interventions:     OT Goals(Current goals can be found in the care plan section) Acute Rehab OT Goals Patient Stated Goal: go home today OT Goal Formulation: With patient  Visit Information  Last OT Received On: 10/16/13 Assistance Needed: +1 History of Present Illness: Claudia Rich is a 80 y.o. female presenting with nausea and vomiting.  Workup in the emergency department revealed hypokalemia and right middle lobe and right lower lobe infiltrate.  PMH is significant for ESRD on HD T-Th-Sa       Prior Functioning     Home Living Family/patient expects to be discharged to:: Private residence Living Arrangements: Other (Comment) (had aides ) Available Help at Discharge: Personal care attendant Type of Home: House Home Access: Stairs to enter Entergy CorporationEntrance Stairs-Number of Steps: 2 Entrance Stairs-Rails: None Home Layout: One level Home Equipment: Walker - 2 wheels;Bedside commode;Cane - single point Additional Comments: pt states that her niece from [place] comes down sometimes and supplies her with groceries, other afmily memebers in the area " don't come around " per pt. Pt  alos states that she  stopped  driving a year ago and that church members take her to appointments and to the store when needed Prior Function Level of Independence: Independent with assistive device(s) Communication Communication: No difficulties Dominant Hand: Right         Vision/Perception Vision - History Baseline Vision: Wears glasses all the time Patient Visual Report: No change from baseline Perception Perception: Within Functional Limits   Cognition  Cognition Arousal/Alertness: Awake/alert Behavior During Therapy: WFL for tasks assessed/performed Overall Cognitive Status: Within Functional Limits for tasks assessed    Extremity/Trunk Assessment Upper Extremity Assessment Upper Extremity Assessment: Overall WFL for tasks assessed Lower Extremity Assessment Lower Extremity Assessment: Defer to PT evaluation Cervical / Trunk Assessment Cervical / Trunk Assessment: Normal     Mobility Bed Mobility Overal bed mobility: Modified Independent Transfers Overall transfer level: Needs assistance Equipment used: 1 person hand held assist Transfers: Sit to/from Stand Sit to Stand: Min guard;Supervision General transfer comment: pt unsteady during initial sit -stand from bed in prep for ambulation to bathroom. Pt states that she has a RW and cane at home but does not use them, holds onto furniture at times          Balance Balance Overall balance assessment: Needs assistance Sitting-balance support: No upper extremity supported;Feet supported Sitting balance-Leahy Scale: Normal Standing balance support: Single extremity supported;During functional activity Standing balance-Leahy Scale: Fair Standing balance comment: pt unsteady during initial sit -stand from bed in prep for ambulation to bathroom. Pt states that she has a RW and cane at home but does not use them, holds onto furniture at times   End of Session OT - End of Session Equipment Utilized During Treatment: Gait belt Activity  Tolerance: Patient tolerated treatment well Patient left: in chair;with call Enck/phone within reach  GO     [name] 10/16/2013, 2:16 PM

## 2013-10-16 NOTE — Progress Notes (Addendum)
FMTS ATTENDING ADMISSION NOTE Claudia Eniola,MD I  have seen and examined this patient, reviewed their chart. I have discussed this patient with the resident. I agree with the resident's findings, assessment and care plan.  Patient feels better today,denies any concern. No cough or congestion,no SOB. Feels like she is ready to go home, I agree she is stable for d/c.

## 2013-10-17 ENCOUNTER — Telehealth: Payer: Self-pay | Admitting: Family Medicine

## 2013-10-17 NOTE — Telephone Encounter (Signed)
[facility] for "Med[facility] Management" [facility] fax # [phone].

## 2013-10-17 NOTE — Progress Notes (Signed)
   CARE MANAGEMENT NOTE 10/17/2013  Patient:  Claudia Rich,Claudia Rich   Account Number:  [id number]  Date Initiated:  10/17/2013  Documentation initiated by:  Darlyne RussianOLAND,Darien Kading  Subjective/Objective Assessment:   admitted with HCAP  medical hx: ESRD/HD on TTS     [facility]   Anticipated DC Date:  10/16/2013   Anticipated DC Plan:  HOME W HOME HEALTH SERVICES      DC Planning Services  CM consult      [facility]  [facility]   Choice offered to / List presented to:  C-1 Patient        HH arranged  HH-2 PT      [facility]  [facility]   Status of service:  Completed, signed off Medicare Important Message given?   (If response is "NO", the following Medicare IM given date fields will be blank) Date Medicare IM given:   Date Additional Medicare IM given:    Discharge Disposition:  HOME W HOME HEALTH SERVICES  Per UR Regulation:    If discussed at Long Length of Stay Meetings, dates discussed:    Comments:

## 2013-10-18 ENCOUNTER — Ambulatory Visit: Payer: Medicare HMO | Admitting: Family Medicine

## 2013-10-18 NOTE — Telephone Encounter (Signed)
Please advise,Thank you. Deonne Rooks, Virgel BouquetGiovanna S

## 2013-10-18 NOTE — Discharge Summary (Signed)
FMTS ATTENDING  NOTE Trayden Brandy,MD I  have seen and examined this patient, reviewed their chart. I have discussed this patient with the resident. I agree with the resident's findings, assessment and care plan. 

## 2013-10-21 LAB — CULTURE, BLOOD (ROUTINE X 2)
CULTURE: NO GROWTH
Culture: NO GROWTH

## 2013-10-23 ENCOUNTER — Telehealth: Payer: Self-pay | Admitting: Family Medicine

## 2013-10-23 NOTE — Telephone Encounter (Signed)
Relayed message,Ms Akiko voiced understanding. Kemaya Dorner, Virgel BouquetGiovanna S

## 2013-10-23 NOTE — Telephone Encounter (Signed)
[name] question [facility] norvas.... She had been taking 10mg  but on discharge summary shows 2.5 mg. Please advise

## 2013-10-23 NOTE — Telephone Encounter (Signed)
[facility]

## 2013-10-23 NOTE — Telephone Encounter (Signed)
Pt can resume 10mg  of Nosvasc.

## 2013-10-23 NOTE — Telephone Encounter (Signed)
Left message on patient'[name] voicemail.Claudia [name]  

## 2013-10-25 ENCOUNTER — Ambulatory Visit (INDEPENDENT_AMBULATORY_CARE_PROVIDER_SITE_OTHER): Payer: Medicare HMO | Admitting: Family Medicine

## 2013-10-25 ENCOUNTER — Telehealth: Payer: Self-pay | Admitting: Family Medicine

## 2013-10-25 ENCOUNTER — Ambulatory Visit: Payer: Medicare HMO | Admitting: Family Medicine

## 2013-10-25 VITALS — BP 125/57 | HR 85 | Temp 98.1°F | Ht 63.0 in | Wt 155.0 lb

## 2013-10-25 DIAGNOSIS — R112 Nausea with vomiting, unspecified: Secondary | ICD-10-CM

## 2013-10-25 DIAGNOSIS — J189 Pneumonia, unspecified organism: Secondary | ICD-10-CM

## 2013-10-25 MED ORDER — ONDANSETRON 4 MG PO TBDP: 4.0000 mg | ORAL_TABLET | Freq: Three times a day (TID) | ORAL | Status: DC | PRN

## 2013-10-25 NOTE — Telephone Encounter (Signed)
Returned dr Arlana Lindaupilotos call

## 2013-10-25 NOTE — Patient Instructions (Signed)
I will call the number provided in order to clarify what home health wants and how can I help better. Continue with your current tratmnet. I will prescribe Zofran this medication is for nausea as needed. Please inform your nephrologist about what had happened in Dialysis last Tuesday.

## 2013-10-26 ENCOUNTER — Telehealth: Payer: Self-pay | Admitting: Family Medicine

## 2013-10-26 DIAGNOSIS — R112 Nausea with vomiting, unspecified: Secondary | ICD-10-CM | POA: Insufficient documentation

## 2013-10-26 NOTE — Telephone Encounter (Signed)
Marcelino DusterMichelle called a home health care nurse and would like verbal orders to have a home health care nurse  And pill box for Alyzah's. This is not through Medicare it is based on a sliding scale and Atleigh's power of attorney is going to pay for this. They would also like a list of Milinda's medications and when she needs to take them. Please call Marcelino DusterMichelle at [phone]. jw

## 2013-10-26 NOTE — Assessment & Plan Note (Signed)
After dialysis, improved today. Zofran given for symptomatic treatment and encouraged her to continue dialysis as scheduled.  F/u with us if symptoms worsen, other symptoms appear and emergent evaluationif she is not able to keep fluids/food down

## 2013-10-26 NOTE — Assessment & Plan Note (Signed)
Condition improved. F/u as needed

## 2013-10-26 NOTE — Progress Notes (Signed)
Family Medicine Office Visit Note   Subjective:   Patient ID: Claudia Rich, female  DOB: [birth year], 80 y.o.. MRN: [id number]   Pt that comes today for recent hospitalization f/u. She was admitted with HCAP and reports feeling better from her respiratory stand point. Only mid cough with white to clear intermittent sputum production. Denies fever or chills.  Her concern today is her nausea and vomiting she had after dialysis yesterday. She reports feeling better today and able to keep food and fluids down. Family member that accompanies her was concerned that she was taken too much fluid during dialysis. Denies  Diarrhea, abdominal pain or other symptoms.  Review of Systems:  Per HPI  Objective:   Physical Exam: Gen:  NAD HEENT: Moist mucous membranes  CV: Regular rate and rhythm, no murmurs rubs or gallops PULM: Clear to auscultation bilaterally. No wheezes/rales/rhonchi EXT: No edema Neuro: Alert and oriented. No focalization  Assessment & Plan:

## 2013-10-27 ENCOUNTER — Other Ambulatory Visit: Payer: Self-pay | Admitting: Pharmacist

## 2013-11-03 ENCOUNTER — Telehealth: Payer: Self-pay | Admitting: Family Medicine

## 2013-11-03 NOTE — Telephone Encounter (Signed)
[facility] Marcelino DusterMichelle would like verbal orders to pt can receive home health such as help with baths, light housekeeping,-probably 2 days per week. She is also wanting to provide pill box assistance but would need a list of medications so she could fill the boxes. She is hoping to go out this afternoon to write up the care plan The med list can be faxed to [phone]--fax

## 2013-11-06 ENCOUNTER — Telehealth: Payer: Self-pay | Admitting: *Deleted

## 2013-11-06 NOTE — Telephone Encounter (Signed)
Received Rx order for diabetic testing supplies.  Order placed in provider box for review.  Clovis PuMartin, Keelee Yankey L, RN

## 2013-11-07 NOTE — Telephone Encounter (Signed)
Claudia Rich called again for the verbal order. She is making the visit tomorrow and needs the order before then. Pt will be out of her meds tomorrow  [phone]

## 2013-11-08 NOTE — Telephone Encounter (Signed)
LM for Claudia Rich and gave verbal ok for home health and pill box.  Medication list faxed to number given.  Claudia Rich,CMA

## 2013-11-08 NOTE — Telephone Encounter (Signed)
Rayna SextonRalph called again about diabetic supplies. York SpanielSaid it was urgent--it needed to be handled today

## 2013-11-09 NOTE — Telephone Encounter (Signed)
This had been placed to be faxed yesterday.

## 2013-11-14 ENCOUNTER — Telehealth: Payer: Self-pay | Admitting: Family Medicine

## 2013-11-14 NOTE — Telephone Encounter (Signed)
[facility] orders to see patient once more. Please call [phone] with verbal ok

## 2013-11-14 NOTE — Telephone Encounter (Signed)
Fax not received-faxed again

## 2013-11-14 NOTE — Telephone Encounter (Signed)
LVM on secure line giving the verbal orders

## 2013-11-21 ENCOUNTER — Telehealth: Payer: Self-pay | Admitting: *Deleted

## 2013-11-21 NOTE — Telephone Encounter (Signed)
Request for current medication list by Wynonia SoursMichele Hamilton, [facility]. Medication list faxed. Clovis PuMartin, Harrol Novello L, RN

## 2013-12-01 ENCOUNTER — Telehealth: Payer: Self-pay | Admitting: Family Medicine

## 2013-12-01 NOTE — Telephone Encounter (Signed)
CAP/DA service application for patient was faxed over and needs completion. Original paperwork faxed over on 11/08/13 and has no response. Please complete promptly.

## 2013-12-06 NOTE — Telephone Encounter (Signed)
I have completed and placed for fax paperwork needed. I have received another set of paperwork this Monday 4/13 and I am in the process of completing it. I will finish as soon as I am able. Thank you.

## 2013-12-20 ENCOUNTER — Telehealth: Payer: Self-pay | Admitting: *Deleted

## 2013-12-20 NOTE — Telephone Encounter (Signed)
Cathy from [facility] called needing a Plan of Care ([phone] signed and fax back to them.  Forms placed in provider for review.  Clovis Puamika L Danine Hor, RN

## 2013-12-27 ENCOUNTER — Other Ambulatory Visit: Payer: Self-pay | Admitting: Family Medicine

## 2013-12-28 ENCOUNTER — Telehealth: Payer: Self-pay | Admitting: Family Medicine

## 2013-12-28 NOTE — Telephone Encounter (Signed)
After speaking with Dr. Aviva SignsPiloto I called [name] back and advised patient that patient can use any over the counter moisturizer that is for sensitive skin, she agreed to this.

## 2013-12-28 NOTE — Telephone Encounter (Signed)
Pt's skin is very dry and itchy. What can she be givien over the counter?

## 2014-01-16 ENCOUNTER — Other Ambulatory Visit: Payer: Self-pay | Admitting: *Deleted

## 2014-01-17 ENCOUNTER — Other Ambulatory Visit: Payer: Self-pay | Admitting: Family Medicine

## 2014-01-17 MED ORDER — RANITIDINE HCL 150 MG PO CAPS: 150.0000 mg | ORAL_CAPSULE | Freq: Two times a day (BID) | ORAL | Status: DC

## 2014-01-17 MED ORDER — GUAIFENESIN ER 600 MG PO TB12: ORAL_TABLET | ORAL | Status: DC

## 2014-02-21 ENCOUNTER — Other Ambulatory Visit: Payer: Self-pay | Admitting: Family Medicine

## 2014-03-13 ENCOUNTER — Emergency Department (HOSPITAL_COMMUNITY)
Admission: EM | Admit: 2014-03-13 | Discharge: 2014-03-13 | Disposition: A | Discharge status: Home / Self Care | Payer: Medicare HMO | Attending: Emergency Medicine | Admitting: Emergency Medicine

## 2014-03-13 ENCOUNTER — Encounter (HOSPITAL_COMMUNITY): Payer: Self-pay | Admitting: Emergency Medicine

## 2014-03-13 ENCOUNTER — Emergency Department (HOSPITAL_COMMUNITY): Payer: Medicare HMO

## 2014-03-13 DIAGNOSIS — E785 Hyperlipidemia, unspecified: Secondary | ICD-10-CM | POA: Diagnosis not present

## 2014-03-13 DIAGNOSIS — IMO0002 Reserved for concepts with insufficient information to code with codable children: Secondary | ICD-10-CM | POA: Insufficient documentation

## 2014-03-13 DIAGNOSIS — I120 Hypertensive chronic kidney disease with stage 5 chronic kidney disease or end stage renal disease: Secondary | ICD-10-CM | POA: Insufficient documentation

## 2014-03-13 DIAGNOSIS — J4489 Other specified chronic obstructive pulmonary disease: Secondary | ICD-10-CM | POA: Insufficient documentation

## 2014-03-13 DIAGNOSIS — Z8669 Personal history of other diseases of the nervous system and sense organs: Secondary | ICD-10-CM | POA: Insufficient documentation

## 2014-03-13 DIAGNOSIS — N186 End stage renal disease: Secondary | ICD-10-CM | POA: Diagnosis not present

## 2014-03-13 DIAGNOSIS — Z860100 Personal history of colon polyps, unspecified: Secondary | ICD-10-CM | POA: Insufficient documentation

## 2014-03-13 DIAGNOSIS — K219 Gastro-esophageal reflux disease without esophagitis: Secondary | ICD-10-CM | POA: Insufficient documentation

## 2014-03-13 DIAGNOSIS — R42 Dizziness and giddiness: Secondary | ICD-10-CM | POA: Insufficient documentation

## 2014-03-13 DIAGNOSIS — Z8739 Personal history of other diseases of the musculoskeletal system and connective tissue: Secondary | ICD-10-CM | POA: Diagnosis not present

## 2014-03-13 DIAGNOSIS — Z862 Personal history of diseases of the blood and blood-forming organs and certain disorders involving the immune mechanism: Secondary | ICD-10-CM | POA: Insufficient documentation

## 2014-03-13 DIAGNOSIS — I252 Old myocardial infarction: Secondary | ICD-10-CM | POA: Insufficient documentation

## 2014-03-13 DIAGNOSIS — J449 Chronic obstructive pulmonary disease, unspecified: Secondary | ICD-10-CM | POA: Insufficient documentation

## 2014-03-13 DIAGNOSIS — J159 Unspecified bacterial pneumonia: Secondary | ICD-10-CM | POA: Diagnosis not present

## 2014-03-13 DIAGNOSIS — E119 Type 2 diabetes mellitus without complications: Secondary | ICD-10-CM | POA: Insufficient documentation

## 2014-03-13 DIAGNOSIS — Z8673 Personal history of transient ischemic attack (TIA), and cerebral infarction without residual deficits: Secondary | ICD-10-CM | POA: Diagnosis not present

## 2014-03-13 DIAGNOSIS — R55 Syncope and collapse: Secondary | ICD-10-CM | POA: Diagnosis not present

## 2014-03-13 DIAGNOSIS — Z992 Dependence on renal dialysis: Secondary | ICD-10-CM | POA: Insufficient documentation

## 2014-03-13 DIAGNOSIS — Z7982 Long term (current) use of aspirin: Secondary | ICD-10-CM | POA: Insufficient documentation

## 2014-03-13 DIAGNOSIS — Z79899 Other long term (current) drug therapy: Secondary | ICD-10-CM | POA: Insufficient documentation

## 2014-03-13 DIAGNOSIS — J189 Pneumonia, unspecified organism: Secondary | ICD-10-CM

## 2014-03-13 LAB — CBC WITH DIFFERENTIAL/PLATELET
BASOS ABS: 0 10*3/uL (ref 0.0–0.1)
Basophils Relative: 0 % (ref 0–1)
EOS PCT: 1 % (ref 0–5)
Eosinophils Absolute: 0.1 10*3/uL (ref 0.0–0.7)
HEMATOCRIT: 36.8 % (ref 36.0–46.0)
HEMOGLOBIN: 11.7 g/dL — AB (ref 12.0–15.0)
LYMPHS PCT: 6 % — AB (ref 12–46)
Lymphs Abs: 0.6 10*3/uL — ABNORMAL LOW (ref 0.7–4.0)
MCH: 32.7 pg (ref 26.0–34.0)
MCHC: 31.8 g/dL (ref 30.0–36.0)
MCV: 102.8 fL — AB (ref 78.0–100.0)
MONO ABS: 0.5 10*3/uL (ref 0.1–1.0)
Monocytes Relative: 5 % (ref 3–12)
Neutro Abs: 8.8 10*3/uL — ABNORMAL HIGH (ref 1.7–7.7)
Neutrophils Relative %: 88 % — ABNORMAL HIGH (ref 43–77)
Platelets: 160 10*3/uL (ref 150–400)
RBC: 3.58 MIL/uL — ABNORMAL LOW (ref 3.87–5.11)
RDW: 14.8 % (ref 11.5–15.5)
WBC: 10 10*3/uL (ref 4.0–10.5)

## 2014-03-13 LAB — COMPREHENSIVE METABOLIC PANEL
ALT: 16 U/L (ref 0–35)
ANION GAP: 16 — AB (ref 5–15)
AST: 25 U/L (ref 0–37)
Albumin: 3.6 g/dL (ref 3.5–5.2)
Alkaline Phosphatase: 164 U/L — ABNORMAL HIGH (ref 39–117)
BUN: 16 mg/dL (ref 6–23)
CALCIUM: 9 mg/dL (ref 8.4–10.5)
CHLORIDE: 94 meq/L — AB (ref 96–112)
CO2: 30 meq/L (ref 19–32)
Creatinine, Ser: 4.41 mg/dL — ABNORMAL HIGH (ref 0.50–1.10)
GFR calc Af Amer: 10 mL/min — ABNORMAL LOW (ref >90)
GFR, EST NON AFRICAN AMERICAN: 8 mL/min — AB (ref >90)
Glucose, Bld: 172 mg/dL — ABNORMAL HIGH (ref 70–99)
Potassium: 3.9 mEq/L (ref 3.7–5.3)
Sodium: 140 mEq/L (ref 137–147)
Total Bilirubin: 0.3 mg/dL (ref 0.3–1.2)
Total Protein: 8.8 g/dL — ABNORMAL HIGH (ref 6.0–8.3)

## 2014-03-13 LAB — I-STAT TROPONIN, ED: TROPONIN I, POC: 0.01 ng/mL (ref 0.00–0.08)

## 2014-03-13 MED ORDER — LEVOFLOXACIN IN D5W 750 MG/150ML IV SOLN
750.0000 mg | Freq: Once | INTRAVENOUS | Status: AC
  Administered 2014-03-13: 750 mg via INTRAVENOUS
  Filled 2014-03-13: qty 150

## 2014-03-13 MED ORDER — LEVOFLOXACIN 500 MG PO TABS: ORAL_TABLET | ORAL | Status: DC

## 2014-03-13 MED ORDER — SODIUM CHLORIDE 0.9 % IV BOLUS (SEPSIS)
500.0000 mL | Freq: Once | INTRAVENOUS | Status: AC
  Administered 2014-03-13: 500 mL via INTRAVENOUS

## 2014-03-13 NOTE — ED Notes (Signed)
Pt to ED from dialysis center via [facility] c/o syncope post treatment.  Pt was standing outside waiting on ride home when she felt weak and was assisted back inside facility. Staff gave 500 bolus, bp 118/65. Pt had one episode of nausea en route, 4mg  zofran given per EMS.

## 2014-03-13 NOTE — ED Notes (Signed)
Patient discharged with all personal belongings. 

## 2014-03-13 NOTE — ED Notes (Signed)
EDP at bedside to discuss plan of care. Pt will receive IV dose of antibiotic and then be discharged with PO antibiotics.

## 2014-03-13 NOTE — ED Notes (Signed)
Attempted to draw labs. unsuccessful 

## 2014-03-13 NOTE — ED Notes (Signed)
IV team at bedside de-accessing dialysis cath.

## 2014-03-13 NOTE — ED Provider Notes (Signed)
CSN: [id number]     Arrival date & time 03/13/14  1747 History   First MD Initiated Contact with Patient 03/13/14 1754     Chief Complaint  Patient presents with  . Near Syncope     (Consider location/radiation/quality/duration/timing/severity/associated sxs/prior Treatment) The history is provided by the patient.  Claudia Rich is a 80 y.o. female hx of HTN, HL, COPD, ESRD on HD (dialyzed today), here with near syncope. Patient finished dialysis today. She was waiting for a ride and then felt light headed and dizzy and almost passed out. Denies chest pain or shortness of breath. Has some nonproductive cough as well. Has near syncope after previous dialysis.    Past Medical History  Diagnosis Date  . Hypertension   . Diabetes mellitus   . Hyperlipidemia   . GERD (gastroesophageal reflux disease)   . Glaucoma   . Hammer toe   . Internal hemorrhoids   . Diverticulosis   . Colon polyps   . Peripheral vascular disease   . Anemia   . COPD (chronic obstructive pulmonary disease)   . Stroke 10/2010    Has some memory impairment per DTR  . Myocardial infarction 10/2010  . ESRD on hemodialysis     TTS at [facility]. Started HD    Past Surgical History  Procedure Laterality Date  . Av fistula repair  02/16/2011    Revision of left brachiocephalic arteriovenous fistula with patch angioplasty of left brachiocephalic fistula  . Av fistula placement, brachiocephalic  09/24/2010    Left  . Av fistula placement    . Cataract extraction    . Abdominal hysterectomy    . Av fistula placement  08/14/2011    Procedure: ARTERIOVENOUS (AV) FISTULA CREATION;  Surgeon: Chuck Hint, MD;  Location: [facility] OR;  Service: Vascular;  Laterality: Right;  Right Arm Arteriovenous Fistula Creation/ contact pt's niece,[name] for any information to be relayed to pt: # [phone]  . Insertion of dialysis catheter  09/28/2011    Procedure: INSERTION OF DIALYSIS CATHETER;  Surgeon: Sherren Kerns, MD;   Location: [facility] OR;  Service: Vascular;  Laterality: Left;  Insertion of Dialysis Catheter left IJ  . Eye surgery    . Av fistula placement  12/07/2011    Procedure: INSERTION OF ARTERIOVENOUS (AV) GORE-TEX GRAFT ARM;  Surgeon: Sherren Kerns, MD;  Location: [facility] OR;  Service: Vascular;  Laterality: Left;  . Thrombectomy and revision of arterioventous (av) goretex  graft  06/28/2012    Procedure: THROMBECTOMY AND REVISION OF ARTERIOVENTOUS (AV) GORETEX  GRAFT;  Surgeon: Chuck Hint, MD;  Location: [facility] OR;  Service: Vascular;  Laterality: Left;   Family History  Problem Relation Age of Onset  . Anesthesia problems Neg Hx   . Hypertension Sister    History  Substance Use Topics  . Smoking status: Never Smoker   . Smokeless tobacco: Never Used  . Alcohol Use: No   OB History   Grav Para Term Preterm Abortions TAB SAB Ect Mult Living                 Review of Systems  Cardiovascular: Positive for near-syncope.  Neurological: Positive for dizziness.       Near syncope   All other systems reviewed and are negative.     Allergies  Aspirin; Codeine sulfate; and Meperidine hcl  Home Medications   Prior to Admission medications   Medication Sig Start Date End Date Taking? Authorizing Provider  albuterol (PROVENTIL HFA;VENTOLIN  HFA) 108 (90 BASE) MCG/ACT inhaler Inhale 2 puffs into the lungs every 4 (four) hours as needed for wheezing. 08/20/13   Dayarmys Piloto de Criselda Peaches, MD  amLODipine (NORVASC) 2.5 MG tablet Take 1 tablet (2.5 mg total) by mouth daily. 07/01/12   Rodolph Bong, MD  aspirin 81 MG chewable tablet Chew 81 mg by mouth daily.    Historical Provider, MD  atorvastatin (LIPITOR) 80 MG tablet TAKE ONE TABLET AT BEDTIME. 05/26/13   Nestor Ramp, MD  B Complex-C-Folic Acid (RENAL MULTIVITAMIN FORMULA PO) Take 1 tablet by mouth daily.     Historical Provider, MD  calcium acetate (PHOSLO) 667 MG capsule Take 1,334 mg by mouth 2 (two) times daily.     Historical Provider,  MD  docusate sodium (COLACE) 100 MG capsule Take 100 mg by mouth 2 (two) times daily.    Historical Provider, MD  fluticasone (FLONASE) 50 MCG/ACT nasal spray Place 1 spray into both nostrils daily. 10/09/13   Dayarmys Pil[place] de Criselda Peaches, MD  glipiZIDE (GLUCOTROL) 10 MG tablet TAKE 2 TABLETS TWICE DAILY BEFORE MEALS.    Dayarmys [place] de Criselda Peaches, MD  guaiFENesin (MUCINEX) 600 MG 12 hr tablet TAKE 1 TABLET TWICE DAILY. 01/17/14   Dayarmys Pil[place] de Criselda Peaches, MD  JANUVIA 25 MG tablet TAKE 1 TABLET ONCE DAILY. 02/21/14   Tommie Sams, DO  loratadine (CLARITIN) 10 MG tablet Take 1 tablet (10 mg total) by mouth daily. 10/09/13   Dayarmys Pil[place] de Criselda Peaches, MD  metoprolol tartrate (LOPRESSOR) 25 MG tablet Take 25 mg by mouth See admin instructions. Take 25 mg by mouth twice a day on non-dialysis days. Take 25 mg by mouth once per day on dialysis days.    Historical Provider, MD  ondansetron (ZOFRAN ODT) 4 MG disintegrating tablet Take 1 tablet (4 mg total) by mouth every 8 (eight) hours as needed for nausea or vomiting. 10/25/13   Dayarmys [place] de Criselda Peaches, MD  ranitidine (ZANTAC) 150 MG capsule Take 1 capsule (150 mg total) by mouth 2 (two) times daily. For stomach ulcer    Dayarmys [place] de Criselda Peaches, MD  ranitidine (ZANTAC) 150 MG tablet TAKE 1 TABLET TWICE DAILY. 01/17/14   Dayarmys Pil[place] de Criselda Peaches, MD  tiotropium (SPIRIVA HANDIHALER) 18 MCG inhalation capsule Place 1 capsule (18 mcg total) into inhaler and inhale daily. 01/23/13   Tobey Grim, MD   BP 119/49  Pulse 79  Temp(Src) 97.9 F (36.6 C) (Oral)  Resp 21  SpO2 100% Physical Exam  Nursing note and vitals reviewed. Constitutional: She is oriented to person, place, and time. She appears well-nourished.  Chronically ill   HENT:  Head: Normocephalic.  Mouth/Throat: Oropharynx is clear and moist.  Eyes: Conjunctivae are normal. Pupils are equal, round, and reactive to light.  Neck: Normal range of motion. Neck supple.  Cardiovascular: Normal rate,  regular rhythm and normal heart sounds.   Pulmonary/Chest: Effort normal and breath sounds normal. No respiratory distress. She has no wheezes. She has no rales.  Abdominal: Soft. Bowel sounds are normal. She exhibits no distension. There is no tenderness. There is no rebound and no guarding.  Musculoskeletal: Normal range of motion.  Neurological: She is alert and oriented to person, place, and time. No cranial nerve deficit. Coordination normal.  Skin: Skin is warm and dry.  Psychiatric: She has a normal mood and affect. Her behavior is normal. Judgment and thought content normal.    ED Course  Procedures (including critical care time) Labs Review Labs Reviewed  CBC WITH DIFFERENTIAL - Abnormal; Notable for the following:    RBC 3.58 (*)    Hemoglobin 11.7 (*)    MCV 102.8 (*)    Neutrophils Relative % 88 (*)    Neutro Abs 8.8 (*)    Lymphocytes Relative 6 (*)    Lymphs Abs 0.6 (*)    All other components within normal limits  COMPREHENSIVE METABOLIC PANEL - Abnormal; Notable for the following:    Chloride 94 (*)    Glucose, Bld 172 (*)    Creatinine, Ser 4.41 (*)    Total Protein 8.8 (*)    Alkaline Phosphatase 164 (*)    GFR calc non Af Amer 8 (*)    GFR calc Af Amer 10 (*)    Anion gap 16 (*)    All other components within normal limits  I-STAT TROPOININ, ED  I-STAT CG4 LACTIC ACID, ED    Imaging Review Dg Chest 2 View  03/13/2014   CLINICAL DATA:  Near syncope, cough  EXAM: CHEST  2 VIEW  COMPARISON:  10/14/2013, 04/01/2013, 12/1912  FINDINGS: Old posterior left rib fractures. Mild cardiac enlargement stable. Aorta uncoiled. Vascular pattern normal. There is a vascular stent over the anticipated position of the left brachiocephalic vein.  There is mild interstitial change at both lung bases. On the right this is most consistent with chronic scarring. There is also scarring on the left but more medially there is slightly greater infiltrate and was previously. Acute  infiltrate related to pneumonia is not excluded.  IMPRESSION: Superimposed on chronic findings is possible early left lower lobe infiltrate. Cannot exclude pneumonia   Electronically Signed   By: Esperanza Heiraymond  Rubner M.D.   On: 03/13/2014 18:54     EKG Interpretation   Date/Time:  Tuesday March 13 2014 18:01:14 EDT Ventricular Rate:  76 PR Interval:  137 QRS Duration: 91 QT Interval:  416 QTC Calculation: 468 R Axis:   58 Text Interpretation:  Sinus rhythm No significant change since last  tracing Confirmed by YAO  MD, DAVID ([id number]) on 03/13/2014 6:05:14 PM      MDM   Final diagnoses:  None   Shalene R Robicheaux is a 80 y.o. female here with near syncope. Likely from volume depletion after dialysis. Will get orthostatics. Will get labs.   9 PM Labs at baseline. CXR showed possible early pneumonia. Given levaquin. Will d/c home on levaquin Q 48 hrs.     Richardean Canalavid H Yao, MD 03/13/14 2240

## 2014-03-13 NOTE — ED Notes (Signed)
Pt received dialysis treatment today and was outside waiting on the bus. Pt reports not remembering anything except waking up in dialysis chair.  Staff assisted pt into center, gave 550 bolus of NS. 20g IV to R hand per EMS; gave 4mg  zofran for nausea. Pt reports 2 episode of vomiting enroute.

## 2014-03-13 NOTE — ED Notes (Signed)
Patient given meal bag and drink. 

## 2014-03-13 NOTE — ED Notes (Signed)
Dr. Yao at bedside. 

## 2014-03-13 NOTE — ED Notes (Signed)
Paged IV team paged for dialysis graft deaccess.

## 2014-03-13 NOTE — Discharge Instructions (Signed)
Take levaquin every other day for 3 doses.   Follow up with your doctor.   Stay hydrated.   Return to ER if you have passing out, chest pain, fever.

## 2014-03-13 NOTE — ED Notes (Signed)
Patients IV antibiotic is finished and waiting on the fluid bolus to finish to be able to discharge patient out. Patient has been updated and aware of the delay.

## 2014-04-09 ENCOUNTER — Other Ambulatory Visit: Payer: Self-pay | Admitting: *Deleted

## 2014-04-09 MED ORDER — RANITIDINE HCL 150 MG PO CAPS: 150.0000 mg | ORAL_CAPSULE | Freq: Two times a day (BID) | ORAL | Status: DC

## 2014-04-09 MED ORDER — GLIPIZIDE 10 MG PO TABS: ORAL_TABLET | ORAL | Status: DC

## 2014-05-01 ENCOUNTER — Other Ambulatory Visit: Payer: Self-pay | Admitting: *Deleted

## 2014-05-01 MED ORDER — METOPROLOL TARTRATE 25 MG PO TABS: ORAL_TABLET | ORAL | Status: DC

## 2014-05-31 ENCOUNTER — Other Ambulatory Visit: Payer: Self-pay | Admitting: *Deleted

## 2014-05-31 MED ORDER — ALBUTEROL SULFATE HFA 108 (90 BASE) MCG/ACT IN AERS: 2.0000 | INHALATION_SPRAY | RESPIRATORY_TRACT | Status: DC | PRN

## 2014-06-13 ENCOUNTER — Other Ambulatory Visit: Payer: Self-pay | Admitting: Family Medicine

## 2014-07-02 ENCOUNTER — Other Ambulatory Visit: Payer: Self-pay | Admitting: Family Medicine

## 2014-07-10 ENCOUNTER — Other Ambulatory Visit: Payer: Self-pay | Admitting: *Deleted

## 2014-07-10 DIAGNOSIS — R059 Cough, unspecified: Secondary | ICD-10-CM

## 2014-07-10 DIAGNOSIS — R05 Cough: Secondary | ICD-10-CM

## 2014-07-10 MED ORDER — TIOTROPIUM BROMIDE MONOHYDRATE 18 MCG IN CAPS: 18.0000 ug | ORAL_CAPSULE | Freq: Every day | RESPIRATORY_TRACT | Status: DC

## 2014-07-24 ENCOUNTER — Other Ambulatory Visit: Payer: Self-pay | Admitting: *Deleted

## 2014-07-24 MED ORDER — ATORVASTATIN CALCIUM 80 MG PO TABS: 80.0000 mg | ORAL_TABLET | Freq: Every day | ORAL | Status: DC

## 2014-08-02 ENCOUNTER — Encounter (HOSPITAL_COMMUNITY): Payer: Self-pay | Admitting: Vascular Surgery

## 2014-08-24 DIAGNOSIS — N186 End stage renal disease: Secondary | ICD-10-CM | POA: Diagnosis not present

## 2014-08-24 DIAGNOSIS — Z992 Dependence on renal dialysis: Secondary | ICD-10-CM | POA: Diagnosis not present

## 2014-08-26 DIAGNOSIS — N186 End stage renal disease: Secondary | ICD-10-CM | POA: Diagnosis not present

## 2014-08-28 DIAGNOSIS — N186 End stage renal disease: Secondary | ICD-10-CM | POA: Diagnosis not present

## 2014-08-30 DIAGNOSIS — N186 End stage renal disease: Secondary | ICD-10-CM | POA: Diagnosis not present

## 2014-09-01 DIAGNOSIS — N186 End stage renal disease: Secondary | ICD-10-CM | POA: Diagnosis not present

## 2014-09-04 DIAGNOSIS — N186 End stage renal disease: Secondary | ICD-10-CM | POA: Diagnosis not present

## 2014-09-06 DIAGNOSIS — N186 End stage renal disease: Secondary | ICD-10-CM | POA: Diagnosis not present

## 2014-09-08 DIAGNOSIS — N186 End stage renal disease: Secondary | ICD-10-CM | POA: Diagnosis not present

## 2014-09-11 DIAGNOSIS — N186 End stage renal disease: Secondary | ICD-10-CM | POA: Diagnosis not present

## 2014-09-13 DIAGNOSIS — N186 End stage renal disease: Secondary | ICD-10-CM | POA: Diagnosis not present

## 2014-09-14 DIAGNOSIS — N186 End stage renal disease: Secondary | ICD-10-CM | POA: Diagnosis not present

## 2014-09-18 DIAGNOSIS — N186 End stage renal disease: Secondary | ICD-10-CM | POA: Diagnosis not present

## 2014-09-20 DIAGNOSIS — N186 End stage renal disease: Secondary | ICD-10-CM | POA: Diagnosis not present

## 2014-09-22 DIAGNOSIS — E785 Hyperlipidemia, unspecified: Secondary | ICD-10-CM | POA: Diagnosis not present

## 2014-09-22 DIAGNOSIS — I517 Cardiomegaly: Secondary | ICD-10-CM | POA: Diagnosis not present

## 2014-09-22 DIAGNOSIS — N186 End stage renal disease: Secondary | ICD-10-CM | POA: Diagnosis not present

## 2014-09-22 DIAGNOSIS — J9811 Atelectasis: Secondary | ICD-10-CM | POA: Diagnosis not present

## 2014-09-22 DIAGNOSIS — R05 Cough: Secondary | ICD-10-CM | POA: Diagnosis not present

## 2014-09-22 DIAGNOSIS — R112 Nausea with vomiting, unspecified: Secondary | ICD-10-CM | POA: Diagnosis not present

## 2014-09-22 DIAGNOSIS — E1165 Type 2 diabetes mellitus with hyperglycemia: Secondary | ICD-10-CM | POA: Diagnosis not present

## 2014-09-22 DIAGNOSIS — J449 Chronic obstructive pulmonary disease, unspecified: Secondary | ICD-10-CM | POA: Diagnosis not present

## 2014-09-22 DIAGNOSIS — J189 Pneumonia, unspecified organism: Secondary | ICD-10-CM | POA: Diagnosis not present

## 2014-09-22 DIAGNOSIS — Z992 Dependence on renal dialysis: Secondary | ICD-10-CM | POA: Diagnosis not present

## 2014-09-22 DIAGNOSIS — I120 Hypertensive chronic kidney disease with stage 5 chronic kidney disease or end stage renal disease: Secondary | ICD-10-CM | POA: Diagnosis not present

## 2014-09-23 DIAGNOSIS — J189 Pneumonia, unspecified organism: Secondary | ICD-10-CM | POA: Diagnosis not present

## 2014-09-23 DIAGNOSIS — N186 End stage renal disease: Secondary | ICD-10-CM | POA: Diagnosis not present

## 2014-09-23 DIAGNOSIS — E119 Type 2 diabetes mellitus without complications: Secondary | ICD-10-CM | POA: Diagnosis not present

## 2014-09-23 DIAGNOSIS — R1110 Vomiting, unspecified: Secondary | ICD-10-CM | POA: Diagnosis not present

## 2014-09-24 DIAGNOSIS — J189 Pneumonia, unspecified organism: Secondary | ICD-10-CM | POA: Diagnosis not present

## 2014-09-24 DIAGNOSIS — J479 Bronchiectasis, uncomplicated: Secondary | ICD-10-CM | POA: Diagnosis not present

## 2014-09-24 DIAGNOSIS — N186 End stage renal disease: Secondary | ICD-10-CM | POA: Diagnosis not present

## 2014-09-24 DIAGNOSIS — R1110 Vomiting, unspecified: Secondary | ICD-10-CM | POA: Diagnosis not present

## 2014-09-24 DIAGNOSIS — E119 Type 2 diabetes mellitus without complications: Secondary | ICD-10-CM | POA: Diagnosis not present

## 2014-09-24 DIAGNOSIS — Z992 Dependence on renal dialysis: Secondary | ICD-10-CM | POA: Diagnosis not present

## 2014-09-24 DIAGNOSIS — R918 Other nonspecific abnormal finding of lung field: Secondary | ICD-10-CM | POA: Diagnosis not present

## 2014-09-25 DIAGNOSIS — N186 End stage renal disease: Secondary | ICD-10-CM | POA: Diagnosis not present

## 2014-09-25 DIAGNOSIS — E119 Type 2 diabetes mellitus without complications: Secondary | ICD-10-CM | POA: Diagnosis not present

## 2014-09-25 DIAGNOSIS — J189 Pneumonia, unspecified organism: Secondary | ICD-10-CM | POA: Diagnosis not present

## 2014-09-25 DIAGNOSIS — J449 Chronic obstructive pulmonary disease, unspecified: Secondary | ICD-10-CM | POA: Diagnosis not present

## 2014-09-25 DIAGNOSIS — A419 Sepsis, unspecified organism: Secondary | ICD-10-CM | POA: Diagnosis not present

## 2014-09-25 DIAGNOSIS — D631 Anemia in chronic kidney disease: Secondary | ICD-10-CM | POA: Diagnosis not present

## 2014-09-27 DIAGNOSIS — N186 End stage renal disease: Secondary | ICD-10-CM | POA: Diagnosis not present

## 2014-09-28 DIAGNOSIS — R0602 Shortness of breath: Secondary | ICD-10-CM | POA: Diagnosis not present

## 2014-09-28 DIAGNOSIS — J189 Pneumonia, unspecified organism: Secondary | ICD-10-CM | POA: Diagnosis not present

## 2014-09-29 DIAGNOSIS — N186 End stage renal disease: Secondary | ICD-10-CM | POA: Diagnosis not present

## 2014-10-02 DIAGNOSIS — N186 End stage renal disease: Secondary | ICD-10-CM | POA: Diagnosis not present

## 2014-10-04 DIAGNOSIS — N186 End stage renal disease: Secondary | ICD-10-CM | POA: Diagnosis not present

## 2014-10-06 DIAGNOSIS — N186 End stage renal disease: Secondary | ICD-10-CM | POA: Diagnosis not present

## 2014-10-09 ENCOUNTER — Other Ambulatory Visit: Payer: Self-pay | Admitting: Family Medicine

## 2014-10-09 DIAGNOSIS — N186 End stage renal disease: Secondary | ICD-10-CM | POA: Diagnosis not present

## 2014-10-11 DIAGNOSIS — N186 End stage renal disease: Secondary | ICD-10-CM | POA: Diagnosis not present

## 2014-10-13 DIAGNOSIS — N186 End stage renal disease: Secondary | ICD-10-CM | POA: Diagnosis not present

## 2014-10-16 DIAGNOSIS — N186 End stage renal disease: Secondary | ICD-10-CM | POA: Diagnosis not present

## 2014-10-18 DIAGNOSIS — N186 End stage renal disease: Secondary | ICD-10-CM | POA: Diagnosis not present

## 2014-10-20 DIAGNOSIS — N186 End stage renal disease: Secondary | ICD-10-CM | POA: Diagnosis not present

## 2014-10-23 DIAGNOSIS — Z992 Dependence on renal dialysis: Secondary | ICD-10-CM | POA: Diagnosis not present

## 2014-10-23 DIAGNOSIS — N186 End stage renal disease: Secondary | ICD-10-CM | POA: Diagnosis not present

## 2014-10-25 ENCOUNTER — Telehealth: Payer: Self-pay | Admitting: Family Medicine

## 2014-10-25 DIAGNOSIS — N186 End stage renal disease: Secondary | ICD-10-CM | POA: Diagnosis not present

## 2014-10-25 NOTE — Telephone Encounter (Signed)
Guardian for patient called because there are some major issue she needs to discuss with Dr. Adriana Simasook. Please call Claudia Rich at [phone]. This is an urgent matter that needs to be addressed since the patient is on dialysis. jw

## 2014-10-25 NOTE — Telephone Encounter (Signed)
Claud[facility] Foot[facility]Sara [facility] to continue [facility] for dialysis.

## 2014-10-25 NOTE — Telephone Encounter (Signed)
Spoke with [employer] who advised me to inform patient contact [employer] herself and find a dialysis center that is in Network. Call patient's guardian [name] and informed her of this.

## 2014-10-27 ENCOUNTER — Other Ambulatory Visit: Payer: Self-pay | Admitting: Family Medicine

## 2014-10-27 DIAGNOSIS — N186 End stage renal disease: Secondary | ICD-10-CM | POA: Diagnosis not present

## 2014-10-30 DIAGNOSIS — N186 End stage renal disease: Secondary | ICD-10-CM | POA: Diagnosis not present

## 2014-11-01 DIAGNOSIS — N186 End stage renal disease: Secondary | ICD-10-CM | POA: Diagnosis not present

## 2014-11-03 DIAGNOSIS — N186 End stage renal disease: Secondary | ICD-10-CM | POA: Diagnosis not present

## 2014-11-06 DIAGNOSIS — N186 End stage renal disease: Secondary | ICD-10-CM | POA: Diagnosis not present

## 2014-11-08 DIAGNOSIS — N186 End stage renal disease: Secondary | ICD-10-CM | POA: Diagnosis not present

## 2014-11-10 DIAGNOSIS — N186 End stage renal disease: Secondary | ICD-10-CM | POA: Diagnosis not present

## 2014-11-13 DIAGNOSIS — N186 End stage renal disease: Secondary | ICD-10-CM | POA: Diagnosis not present

## 2014-11-15 DIAGNOSIS — N186 End stage renal disease: Secondary | ICD-10-CM | POA: Diagnosis not present

## 2014-11-17 DIAGNOSIS — N186 End stage renal disease: Secondary | ICD-10-CM | POA: Diagnosis not present

## 2014-11-20 DIAGNOSIS — N186 End stage renal disease: Secondary | ICD-10-CM | POA: Diagnosis not present

## 2014-11-22 ENCOUNTER — Other Ambulatory Visit: Payer: Self-pay | Admitting: Family Medicine

## 2014-11-22 DIAGNOSIS — N186 End stage renal disease: Secondary | ICD-10-CM | POA: Diagnosis not present

## 2014-11-23 DIAGNOSIS — Z992 Dependence on renal dialysis: Secondary | ICD-10-CM | POA: Diagnosis not present

## 2014-11-23 DIAGNOSIS — N186 End stage renal disease: Secondary | ICD-10-CM | POA: Diagnosis not present

## 2014-11-24 DIAGNOSIS — N186 End stage renal disease: Secondary | ICD-10-CM | POA: Diagnosis not present

## 2014-11-27 DIAGNOSIS — N186 End stage renal disease: Secondary | ICD-10-CM | POA: Diagnosis not present

## 2014-11-29 DIAGNOSIS — N186 End stage renal disease: Secondary | ICD-10-CM | POA: Diagnosis not present

## 2014-12-01 DIAGNOSIS — N186 End stage renal disease: Secondary | ICD-10-CM | POA: Diagnosis not present

## 2014-12-04 DIAGNOSIS — N186 End stage renal disease: Secondary | ICD-10-CM | POA: Diagnosis not present

## 2014-12-06 DIAGNOSIS — N186 End stage renal disease: Secondary | ICD-10-CM | POA: Diagnosis not present

## 2014-12-08 DIAGNOSIS — N186 End stage renal disease: Secondary | ICD-10-CM | POA: Diagnosis not present

## 2014-12-09 ENCOUNTER — Other Ambulatory Visit: Payer: Self-pay | Admitting: Family Medicine

## 2014-12-10 NOTE — Telephone Encounter (Signed)
Covering for patient's PCP  Refilled metop X 2 months.   Needs routine f/u, will ask nursing to inform.   Murtis SinkSam Wallace Gappa, MD [facility] Resident, PGY-3 12/10/2014, 11:06 AM

## 2014-12-11 DIAGNOSIS — N186 End stage renal disease: Secondary | ICD-10-CM | POA: Diagnosis not present

## 2014-12-11 NOTE — Telephone Encounter (Signed)
Patient has an appt scheduled on 12/14/2014 with Dr. Adriana Simasook. Maidie Streight,CMA

## 2014-12-13 ENCOUNTER — Other Ambulatory Visit: Payer: Self-pay | Admitting: Family Medicine

## 2014-12-13 DIAGNOSIS — N186 End stage renal disease: Secondary | ICD-10-CM | POA: Diagnosis not present

## 2014-12-14 ENCOUNTER — Other Ambulatory Visit: Payer: Self-pay | Admitting: Family Medicine

## 2014-12-14 ENCOUNTER — Ambulatory Visit (INDEPENDENT_AMBULATORY_CARE_PROVIDER_SITE_OTHER): Payer: Commercial Managed Care - HMO | Admitting: Family Medicine

## 2014-12-14 ENCOUNTER — Encounter: Payer: Self-pay | Admitting: Family Medicine

## 2014-12-14 ENCOUNTER — Other Ambulatory Visit: Payer: Self-pay

## 2014-12-14 ENCOUNTER — Ambulatory Visit
Admission: RE | Admit: 2014-12-14 | Discharge: 2014-12-14 | Disposition: A | Discharge status: Home / Self Care | Payer: Medicare HMO | Source: Ambulatory Visit | Attending: Family Medicine | Admitting: Family Medicine

## 2014-12-14 VITALS — BP 153/54 | HR 81 | Temp 98.2°F | Ht 63.0 in | Wt 160.8 lb

## 2014-12-14 DIAGNOSIS — M5489 Other dorsalgia: Secondary | ICD-10-CM | POA: Diagnosis not present

## 2014-12-14 DIAGNOSIS — Z1231 Encounter for screening mammogram for malignant neoplasm of breast: Secondary | ICD-10-CM

## 2014-12-14 DIAGNOSIS — E1165 Type 2 diabetes mellitus with hyperglycemia: Secondary | ICD-10-CM | POA: Diagnosis not present

## 2014-12-14 DIAGNOSIS — R059 Cough, unspecified: Secondary | ICD-10-CM | POA: Insufficient documentation

## 2014-12-14 DIAGNOSIS — E119 Type 2 diabetes mellitus without complications: Secondary | ICD-10-CM

## 2014-12-14 DIAGNOSIS — M546 Pain in thoracic spine: Secondary | ICD-10-CM

## 2014-12-14 DIAGNOSIS — IMO0002 Reserved for concepts with insufficient information to code with codable children: Secondary | ICD-10-CM

## 2014-12-14 DIAGNOSIS — Z7189 Other specified counseling: Secondary | ICD-10-CM | POA: Insufficient documentation

## 2014-12-14 DIAGNOSIS — L602 Onychogryphosis: Secondary | ICD-10-CM | POA: Diagnosis not present

## 2014-12-14 DIAGNOSIS — D539 Nutritional anemia, unspecified: Secondary | ICD-10-CM

## 2014-12-14 DIAGNOSIS — E785 Hyperlipidemia, unspecified: Secondary | ICD-10-CM

## 2014-12-14 DIAGNOSIS — M549 Dorsalgia, unspecified: Secondary | ICD-10-CM | POA: Insufficient documentation

## 2014-12-14 DIAGNOSIS — Z0000 Encounter for general adult medical examination without abnormal findings: Secondary | ICD-10-CM

## 2014-12-14 DIAGNOSIS — M545 Low back pain: Secondary | ICD-10-CM | POA: Diagnosis not present

## 2014-12-14 DIAGNOSIS — I10 Essential (primary) hypertension: Secondary | ICD-10-CM | POA: Diagnosis not present

## 2014-12-14 DIAGNOSIS — E1151 Type 2 diabetes mellitus with diabetic peripheral angiopathy without gangrene: Secondary | ICD-10-CM | POA: Diagnosis not present

## 2014-12-14 DIAGNOSIS — M5136 Other intervertebral disc degeneration, lumbar region: Secondary | ICD-10-CM | POA: Diagnosis not present

## 2014-12-14 DIAGNOSIS — I2510 Atherosclerotic heart disease of native coronary artery without angina pectoris: Secondary | ICD-10-CM

## 2014-12-14 DIAGNOSIS — E2839 Other primary ovarian failure: Secondary | ICD-10-CM

## 2014-12-14 DIAGNOSIS — M47816 Spondylosis without myelopathy or radiculopathy, lumbar region: Secondary | ICD-10-CM | POA: Diagnosis not present

## 2014-12-14 DIAGNOSIS — R05 Cough: Secondary | ICD-10-CM

## 2014-12-14 LAB — POCT GLYCOSYLATED HEMOGLOBIN (HGB A1C): Hemoglobin A1C: 5.7

## 2014-12-14 MED ORDER — LORATADINE 10 MG PO TABS: 10.0000 mg | ORAL_TABLET | Freq: Every day | ORAL | Status: DC

## 2014-12-14 MED ORDER — ZOSTER VACCINE LIVE 19400 UNT/0.65ML ~~LOC~~ SOLR: 0.6500 mL | Freq: Once | SUBCUTANEOUS | Status: DC

## 2014-12-14 MED ORDER — TRAMADOL HCL 50 MG PO TABS
50.0000 mg | ORAL_TABLET | Freq: Three times a day (TID) | ORAL | Status: DC | PRN
Start: 2014-12-14 — End: 2017-01-03

## 2014-12-14 NOTE — Assessment & Plan Note (Signed)
Stable. Continuing Aspirin and Statin.

## 2014-12-14 NOTE — Assessment & Plan Note (Signed)
Lipid panel ordered today. Prior lipids have been well controlled. Continuing Statin.

## 2014-12-14 NOTE — Assessment & Plan Note (Addendum)
Well controlled. A1C 5.7 today. Discussed reduction in dose of sulfonylurea (decreasing to 10 mg BID).  Planning for slow reduction.

## 2014-12-14 NOTE — Progress Notes (Addendum)
   Subjective:    Patient ID: Claudia Rich, female    DOB: Dec 27, 1926, 81 y.o.   MRN: [id number]  HPI 81 year old female with a past medical history of DM-2, hyperlipemia, hypertension, ESRD on hemodialysis, CAD presents for an annual visit.  Current complaints: Cough, Back pain.  Healthcare POA Ms. Marvis MoellerMiles present today.    1) Back pain  Patient reports that she has had back pain for several months (~4).  Pain is located in the right lower ribs, right upper back.  She also has some lower back pain as well.  Pain is exacerbated by certain movements/activity.  She takes Aleve as needed with improvement in her pain.  No extremity numbness/tingling.  No weakness.  No recent fall, trauma, injury.  2) Cough  Patient has a longstanding history of cough.  This has been persistent for "years".  Cough is productive.  No relief with PRN Mucinex use.  No recent fever, chills.  Of note, she reportedly has a history of COPD (no spirometry was done per POA report and I could find none in EPIC).  She is taking Albuterol and spiriva; neither of which help her cough.  3) HTN  Home BP Monitoring - No.  Medications - Norvasc, Metoprolol.  Compliance -  Yes.   ROS: See below.  4) DM-2  CBG's - Tests infrequently; Range from 70's - 220's.  Medications - Glipizide 20 mg BID, Januvia 25 mg daily  Compliance - Yes.  Medication side effects  Hypoglycemia: no   Preventative Healthcare up to date?  In need of - Eye exam and foot exam.  5) CAD  Stable.  No chest pain or SOB.  Compliant with ASA and Statin.  Social Hx - Nonsmoker.  Review of Systems General:  Negative for unexplained weight loss, fever Skin: Negative for new or changing mole, sore that won't heal HEENT: Negative for trouble seeing, ringing in ears, mouth sores, hoarseness, change in voice, dysphagia. Positive for trouble hearing.  CV:  Negative for chest pain, dyspnea, edema, palpitations Resp: Negative  for dyspnea, hemoptysis. Positive for Cough. GI: Negative for nausea, vomiting, diarrhea, constipation, abdominal pain, melena, hematochezia. GU: Negative for dysuria, incontinence, urinary hesitance, hematuria, vaginal or penile discharge, polyuria, sexual difficulty, lumps in testicle or breasts MSK: Negative for muscle cramps or aches. Positive for back pain.  Neuro: Negative for headaches, weakness, numbness, dizziness, passing out/fainting Psych: Negative for depression, anxiety. Positive for memory problems.     Objective:   Physical Exam Filed Vitals:   12/14/14 1349  BP: 153/54  Pulse: 81  Temp: 98.2 F (36.8 C)  Vital signs reviewed.  Exam: General: well appearing elderly female in NAD.  HEENT: NCAT. Normal TM's bilaterally. Edentulous (upper & lower dentures present). Oropharynx clear.  Neck: Supple; No adenopathy. Cardiovascular: RRR. No murmurs, rubs, or gallops. Respiratory: CTAB. No rales, rhonchi, or wheeze. No increased work of breathing. Abdomen: soft, nontender, nondistended. Extremities: Trace to 1+ edema.  Back: No spinal tenderness. Slight tenderness of R lower ribs.   Diabetic Foot Check -  Appearance - no lesions, ulcers or calluses Skin - no unusual pallor or redness Monofilament testing -  Right - Great toe, medial, central, lateral ball and posterior foot intact Left - Great toe, medial, central, lateral ball and posterior foot intact      Assessment & Plan:  See Problem List

## 2014-12-14 NOTE — Patient Instructions (Addendum)
It was nice to see you today.  I will send you a letter with the results of your labs.  I will call with the results of your xrays.  Please schedule an appointment with our pharmacist Dr. Raymondo BandKoval for Spirometry to evaluate your breathing.  Continue your medications as prescribed.  Please be sure to get your bone density scan.  Follow up in 3 months.   Take care  Dr. Adriana Simasook

## 2014-12-14 NOTE — Assessment & Plan Note (Signed)
Unclear etiology at this time.  DDX: GERD, Asthma/COPD, Upper Airway cough syndrome. Patient has a reported history of COPD but no history of tobacco use.  No recent fever, chills, dyspnea to suggest infection. GERD well controlled.  Patient to schedule appt with Dr. Raymondo BandKoval for spirometry to begin work up.

## 2014-12-14 NOTE — Assessment & Plan Note (Signed)
Likely MSK in origin; exam unremarkable. Given age, obtaining xray to ensure no underlying compression fracture.  Xray reviewed/interpreter personally.  No evidence of fracture. Loss of lumbar lordosis.  No acute findings.  I agree with radiology report. Tramadol PRN Pain.

## 2014-12-14 NOTE — Assessment & Plan Note (Signed)
BP slightly elevated. Will continue currently therapy with Metoprolol and Norvasc. Will reassess at next visit.

## 2014-12-14 NOTE — Assessment & Plan Note (Addendum)
Referral to Ophthalmology placed for retinal exam. Dexa scan order for screening for Osteoporosis. Zostavax Rx given today. Diabetic foot exam performed today.

## 2014-12-14 NOTE — Assessment & Plan Note (Signed)
CBC and Anemia panel today.

## 2014-12-14 NOTE — Telephone Encounter (Signed)
Forgot to ask during visit about referral for mammogram

## 2014-12-15 ENCOUNTER — Telehealth: Payer: Self-pay | Admitting: Family Medicine

## 2014-12-15 DIAGNOSIS — Z992 Dependence on renal dialysis: Secondary | ICD-10-CM | POA: Diagnosis not present

## 2014-12-15 DIAGNOSIS — N186 End stage renal disease: Secondary | ICD-10-CM | POA: Diagnosis not present

## 2014-12-15 DIAGNOSIS — E1122 Type 2 diabetes mellitus with diabetic chronic kidney disease: Secondary | ICD-10-CM | POA: Diagnosis not present

## 2014-12-15 LAB — BASIC METABOLIC PANEL WITH GFR
BUN: 37 mg/dL — AB (ref 6–23)
CO2: 28 mEq/L (ref 19–32)
CREATININE: 7.82 mg/dL — AB (ref 0.50–1.10)
Calcium: 9.1 mg/dL (ref 8.4–10.5)
Chloride: 96 mEq/L (ref 96–112)
GFR, EST AFRICAN AMERICAN: 5 mL/min — AB
GFR, Est Non African American: 4 mL/min — ABNORMAL LOW
GLUCOSE: 110 mg/dL — AB (ref 70–99)
Potassium: 4 mEq/L (ref 3.5–5.3)
Sodium: 142 mEq/L (ref 135–145)

## 2014-12-15 LAB — ANEMIA PANEL 7
%SAT: 39 % (ref 20–55)
ABS Retic: 121.3 10*3/uL (ref 19.0–186.0)
Ferritin: 1385 ng/mL — ABNORMAL HIGH (ref 10–291)
Folate: 16.6 ng/mL
HCT: 34.1 % — ABNORMAL LOW (ref 36.0–46.0)
Hemoglobin: 10.6 g/dL — ABNORMAL LOW (ref 12.0–15.0)
IRON: 84 ug/dL (ref 42–145)
MCH: 31.5 pg (ref 26.0–34.0)
MCHC: 31.1 g/dL (ref 30.0–36.0)
MCV: 101.2 fL — ABNORMAL HIGH (ref 78.0–100.0)
MPV: 11.6 fL (ref 8.6–12.4)
Platelets: 193 10*3/uL (ref 150–400)
RBC.: 3.37 MIL/uL — ABNORMAL LOW (ref 3.87–5.11)
RBC: 3.37 MIL/uL — ABNORMAL LOW (ref 3.87–5.11)
RDW: 17.3 % — AB (ref 11.5–15.5)
RETIC CT PCT: 3.6 % — AB (ref 0.4–2.3)
TIBC: 216 ug/dL — ABNORMAL LOW (ref 250–470)
UIBC: 132 ug/dL (ref 125–400)
VITAMIN B 12: 912 pg/mL — AB (ref 211–911)
WBC: 7.2 10*3/uL (ref 4.0–10.5)

## 2014-12-15 LAB — LIPID PANEL
Cholesterol: 150 mg/dL (ref 0–200)
HDL: 50 mg/dL (ref >=46)
LDL Cholesterol: 76 mg/dL (ref 0–99)
TRIGLYCERIDES: 122 mg/dL (ref <150)
Total CHOL/HDL Ratio: 3 Ratio
VLDL: 24 mg/dL (ref 0–40)

## 2014-12-15 NOTE — Telephone Encounter (Signed)
Received emergency line page [place] [facility] re: labwork showing BUN 37, Creatinine 7.82 (from 4.41 July 2015 and 8.25 Sep 2013). Also notified me of B12 912 (from 1488 10/2010) Ferritin 1385 (from 1567 11/13), Hgb 10.6 (around baseline) Receives HD T[facility]. Called patient and left voicemail with information about creatinine and reminding her to be sure she goes to her Saturday HD.  Will route to PCP for FYI.  Leona SingletonMaria T Mahrosh Donnell, MD

## 2014-12-17 ENCOUNTER — Telehealth: Payer: Self-pay | Admitting: Family Medicine

## 2014-12-17 ENCOUNTER — Encounter: Payer: Self-pay | Admitting: Family Medicine

## 2014-12-17 NOTE — Telephone Encounter (Signed)
Returned dr cooks call  °

## 2014-12-17 NOTE — Telephone Encounter (Signed)
Spoke with Healthcare POA (Ms. Marvis MoellerMiles) regarding labs/imaging results. Also sent letter to Ms. Bells's home.

## 2014-12-17 NOTE — Telephone Encounter (Signed)
Call back and left another voicemail (this time with my pager number).

## 2014-12-18 DIAGNOSIS — N186 End stage renal disease: Secondary | ICD-10-CM | POA: Diagnosis not present

## 2014-12-20 DIAGNOSIS — N186 End stage renal disease: Secondary | ICD-10-CM | POA: Diagnosis not present

## 2014-12-22 ENCOUNTER — Other Ambulatory Visit: Payer: Self-pay | Admitting: Family Medicine

## 2014-12-22 DIAGNOSIS — N186 End stage renal disease: Secondary | ICD-10-CM | POA: Diagnosis not present

## 2014-12-25 DIAGNOSIS — N186 End stage renal disease: Secondary | ICD-10-CM | POA: Diagnosis not present

## 2014-12-26 ENCOUNTER — Other Ambulatory Visit: Payer: Commercial Managed Care - HMO

## 2014-12-27 DIAGNOSIS — N186 End stage renal disease: Secondary | ICD-10-CM | POA: Diagnosis not present

## 2014-12-28 ENCOUNTER — Encounter: Payer: Self-pay | Admitting: Pharmacist

## 2014-12-28 ENCOUNTER — Ambulatory Visit (INDEPENDENT_AMBULATORY_CARE_PROVIDER_SITE_OTHER): Payer: Commercial Managed Care - HMO | Admitting: Pharmacist

## 2014-12-28 VITALS — BP 152/68 | HR 79 | Ht 65.5 in | Wt 159.8 lb

## 2014-12-28 DIAGNOSIS — R059 Cough, unspecified: Secondary | ICD-10-CM

## 2014-12-28 DIAGNOSIS — R05 Cough: Secondary | ICD-10-CM

## 2014-12-28 MED ORDER — MOMETASONE FURO-FORMOTEROL FUM 200-5 MCG/ACT IN AERO: 2.0000 | INHALATION_SPRAY | Freq: Two times a day (BID) | RESPIRATORY_TRACT | Status: DC

## 2014-12-28 NOTE — Patient Instructions (Addendum)
It was nice to meet you today, Ms. Claudia Rich!  Start taking the [facility] inhaler - 2 puffs twice a day.  Remember to rinse out your mouth after using it.  Keep using the albuterol AS NEEDED if you have difficulty breathing.    Take what is left of the Spiriva

## 2014-12-28 NOTE — Progress Notes (Signed)
S:    Patient arrives in good spirits with the assistance of her caretaker.  Presents for lung function evaluation.  Patient reports breathing has been difficult and that she has had multiple episodes of pneumonia with a chronic cough.   Patient reports last dose of albuterol was a few days ago and her last dose of Spiriva was yesterday. Her caretaker is familiar with the technique of using these medications and together, they are able to verbalize how to use the inhaler.   Patient's daughter was available on the phone and reported that her mother was told she had asthma years ago but during a hospital stay in the last few years, was told that she has COPD. The patient reports that she has had a chronic cough that has made it difficult for her to breath. She denies fever or chills. Patient denies any history of tobacco use. She reports feeling better after taking the albuterol when she needs it but that she doesn't think that the Spiriva really helps.  Patient reports adherence with her medications. She has a automated pill box (named "Greggory StallionGeorge") that helps her take her medications. The pill box alerts her daughter if she did not take her medications or when it is time to get them refilled. The daughter confirmed this on the phone.    O:  See "scanned report" or Documentation Flowsheet (discrete results - PFTs) for  Spirometry results. Patient provided good effort while attempting spirometry.   Albuterol Neb  Lot# [id number]     Exp. March 2017  A/P:  Cough: Spirometry evaluation reveals moderate restrictive lung disease. Post nebulized albuterol tx revealed significant improvement in FVC.  Patient has been experiencing difficulty breathing and chronic cough for several months and taking Spiriva daily and albuterol as needed. Initiate Dulera 200/5 2 puffs twice daily - sample provided. Continue albuterol as needed. Patient to continue Spiriva until it runs out and expect to discontinue it as next  appointment.  Educated patient on purpose, proper use, potential adverse effects including the risk of esophageal candidiasis and need to rinse mouth after each use.  Reviewed results of pulmonary function tests. Patient verbalized understanding of results and education.  Written pt instructions provided.  F/U Clinic visit in 2-3 weeks.   Total time in face to face counseling 45 minutes.  Patient seen with Gus HeightPhillip Transou, PharmD Candidate and Juanita CraverStacey Karl, PharmD, BCPS resident.

## 2014-12-29 DIAGNOSIS — N186 End stage renal disease: Secondary | ICD-10-CM | POA: Diagnosis not present

## 2014-12-31 NOTE — Assessment & Plan Note (Signed)
Cough: Spirometry evaluation reveals moderate restrictive lung disease. Post nebulized albuterol tx revealed significant improvement in FVC.  Patient has been experiencing difficulty breathing and chronic cough for several months and taking Spiriva daily and albuterol as needed. Initiate Dulera 200/5 2 puffs twice daily - sample provided. Continue albuterol as needed. Patient to continue Spiriva until it runs out and expect to discontinue it as next appointment.  Educated patient on purpose, proper use, potential adverse effects including the risk of esophageal candidiasis and need to rinse mouth after each use.  Reviewed results of pulmonary function tests. Patient verbalized understanding of results and education.  Written pt instructions provided.  F/U Clinic visit in 2-3 weeks.

## 2014-12-31 NOTE — Progress Notes (Signed)
Patient ID: [name], female   DOB: 18-Apr-1927, 81 y.o.   MRN: [id number] Reviewed: Agree with Dr. Macky LowerKoval's documentation and management.

## 2015-01-01 ENCOUNTER — Telehealth: Payer: Self-pay | Admitting: Pharmacist

## 2015-01-01 DIAGNOSIS — N186 End stage renal disease: Secondary | ICD-10-CM | POA: Diagnosis not present

## 2015-01-01 NOTE — Telephone Encounter (Signed)
Pharmacy Clinic Follow Up  I called and spoke with [name], the patient's caretaker, about the patient's breathing since she started using the [facility].  She reported that the patient forgot to take it over the weekend and then started to take it yesterday. She is currently in dialysis so [name] could not assess her current breathing.  [name] was able to correctly verbalize how the patient should take the [facility].  I told [name] that the inhaler, at the dose Ms. Christenberry was using, would only last 2 weeks. Therefore they would either need to come get another sample if they needed to keep the same appointment on 01/18/15 or reschedule for 01/11/15. [name] reported that she would speak would Ms. Roback and call me back about the appointment time.   Juanita CraverStacey Karl, PharmD, BCPS [phone]

## 2015-01-03 DIAGNOSIS — N186 End stage renal disease: Secondary | ICD-10-CM | POA: Diagnosis not present

## 2015-01-04 ENCOUNTER — Inpatient Hospital Stay: Admission: RE | Admit: 2015-01-04 | Payer: Commercial Managed Care - HMO | Source: Ambulatory Visit

## 2015-01-04 ENCOUNTER — Ambulatory Visit: Payer: Commercial Managed Care - HMO

## 2015-01-05 DIAGNOSIS — N186 End stage renal disease: Secondary | ICD-10-CM | POA: Diagnosis not present

## 2015-01-08 DIAGNOSIS — N186 End stage renal disease: Secondary | ICD-10-CM | POA: Diagnosis not present

## 2015-01-10 DIAGNOSIS — N186 End stage renal disease: Secondary | ICD-10-CM | POA: Diagnosis not present

## 2015-01-12 DIAGNOSIS — N186 End stage renal disease: Secondary | ICD-10-CM | POA: Diagnosis not present

## 2015-01-14 ENCOUNTER — Ambulatory Visit
Admission: RE | Admit: 2015-01-14 | Discharge: 2015-01-14 | Disposition: A | Discharge status: Home / Self Care | Payer: Commercial Managed Care - HMO | Source: Ambulatory Visit | Attending: Family Medicine | Admitting: Family Medicine

## 2015-01-14 ENCOUNTER — Ambulatory Visit
Admission: RE | Admit: 2015-01-14 | Discharge: 2015-01-14 | Disposition: A | Discharge status: Home / Self Care | Payer: Commercial Managed Care - HMO | Source: Ambulatory Visit

## 2015-01-14 DIAGNOSIS — Z1231 Encounter for screening mammogram for malignant neoplasm of breast: Secondary | ICD-10-CM | POA: Diagnosis not present

## 2015-01-14 DIAGNOSIS — H02831 Dermatochalasis of right upper eyelid: Secondary | ICD-10-CM | POA: Diagnosis not present

## 2015-01-14 DIAGNOSIS — M810 Age-related osteoporosis without current pathological fracture: Secondary | ICD-10-CM | POA: Diagnosis not present

## 2015-01-14 DIAGNOSIS — E2839 Other primary ovarian failure: Secondary | ICD-10-CM

## 2015-01-14 DIAGNOSIS — H02834 Dermatochalasis of left upper eyelid: Secondary | ICD-10-CM | POA: Diagnosis not present

## 2015-01-14 DIAGNOSIS — E11329 Type 2 diabetes mellitus with mild nonproliferative diabetic retinopathy without macular edema: Secondary | ICD-10-CM | POA: Diagnosis not present

## 2015-01-14 DIAGNOSIS — H53461 Homonymous bilateral field defects, right side: Secondary | ICD-10-CM | POA: Diagnosis not present

## 2015-01-14 DIAGNOSIS — Z961 Presence of intraocular lens: Secondary | ICD-10-CM | POA: Diagnosis not present

## 2015-01-15 ENCOUNTER — Encounter: Payer: Self-pay | Admitting: Family Medicine

## 2015-01-15 ENCOUNTER — Telehealth: Payer: Self-pay | Admitting: Family Medicine

## 2015-01-15 DIAGNOSIS — N186 End stage renal disease: Secondary | ICD-10-CM | POA: Diagnosis not present

## 2015-01-15 MED ORDER — ALENDRONATE SODIUM 70 MG PO TABS: 70.0000 mg | ORAL_TABLET | ORAL | Status: DC

## 2015-01-15 NOTE — Telephone Encounter (Signed)
Spoke with power of attorney Mrs. Marvis MoellerMiles and informed her of patient's recent DEXA scan which revealed osteoporosis. We discussed treatment options. Proceeding with Fosamax.

## 2015-01-17 DIAGNOSIS — N186 End stage renal disease: Secondary | ICD-10-CM | POA: Diagnosis not present

## 2015-01-18 ENCOUNTER — Ambulatory Visit (INDEPENDENT_AMBULATORY_CARE_PROVIDER_SITE_OTHER): Payer: Commercial Managed Care - HMO | Admitting: Pharmacist

## 2015-01-18 ENCOUNTER — Encounter: Payer: Self-pay | Admitting: Pharmacist

## 2015-01-18 VITALS — BP 143/56 | HR 78 | Ht 66.0 in | Wt 157.0 lb

## 2015-01-18 DIAGNOSIS — J984 Other disorders of lung: Secondary | ICD-10-CM

## 2015-01-18 MED ORDER — FLUTICASONE-SALMETEROL 230-21 MCG/ACT IN AERO: 2.0000 | INHALATION_SPRAY | Freq: Two times a day (BID) | RESPIRATORY_TRACT | Status: DC

## 2015-01-18 NOTE — Progress Notes (Signed)
S:    Patient arrives in good spirits with her caretaker [name]. Presents for lung function follow-up after the initiation of Dulera.  Patient reports breathing has been better since starting the [facility]. However, she has not been adherent to her [facility], and when she takes it, she only takes it once a day. She reports that she did skip some days and didn't use the Dulera at all. She denies having any Spiriva at home.  Patient reports last dose of asthma medications was yesterday. She did not take any Dulera or albuterol prior to her visit today. Patient had her [facility] sample with her today and the dose count was zero so she was completely out of medication.   O:  BP Readings from Last 3 Encounters:  01/18/15 143/56  12/28/14 152/68  12/14/14 153/54   A/P:  Cough: patient with spirometry revealing moderate restrictive disease with reversibility at last visit. Patient has been experiencing improved shortness of breath and coughing with the use of Dulera, however patient has not been adherent to the [facility]. Advair HFA is the preferred medication on her insurance so will d/c Dulera an initiate Advair HFA 2 puffs twice daily.  Educated patient on purpose, proper use, potential adverse effects including risk of esophageal candidiasis and need to rinse mouth after each use. Patient verbalized understanding of results and education. Shared plan with patient's daughter, [name], over the telephone and will send a secure email to her with patient instructions (patient consented). Written pt instructions provided.  F/U Clinic visit as needed. Next visit with Dr. Adriana Simasook.   Total time in face to face counseling 30 minutes.  Patient seen with Gus HeightPhillip Transou, PharmD Candidate and Juanita CraverStacey Karl, PharmD, BCPS resident.   Essential Hypertension: currently uncontrolled on amlodipine and metoprolol but has had controlled blood pressures in the past. Concerned that these last two elevations may be due to stress from  spirometry testing and Debra's chest pain (see below). If it continues to be elevated, recommend increasing amlodipine.  Marland Kitchen.  CARE Addendum:   Care Provider Burman Blacksmith(Deborah Fryer) who accompanied the patient complained of chest pain radiating through her back upon arrival.  Complaint was assessed by Dr. Mauricio PoBreen, MD - [facility] attending for the day and advised ER evaluation.  Ms Eunice BlaseFryer declined this offer.   She stated she would follow up with her PCP, Dr. Gwenette Greetobyn Saunders on [address].  I called Dr. Lelon PerlaSaunders' office and they were unable to offer a same day evaluation and advised for her to be sent to the ER.   Ms Eunice BlaseFryer was offered transport to the ER one additional time - patient declined this offer.  This decline of offer to transport to the ER for evaluation was witnessed by Arley PhenixJanette Richardson, RN.  Ms Eunice BlaseFryer stated she would follow up with evaluation.

## 2015-01-18 NOTE — Progress Notes (Signed)
Patient ID: [name], female   DOB: 1927-05-19, 81 y.o.   MRN: [id number] Reviewed: Agree with Dr. Macky LowerKoval's documentation and management.

## 2015-01-18 NOTE — Assessment & Plan Note (Addendum)
Cough: patient with spirometry revealing moderate restrictive disease with reversibility at last visit. Patient has been experiencing improved shortness of breath and coughing with the use of Dulera, however patient has not been adherent to the [facility]. Advair HFA is the preferred medication on her insurance so will d/c Dulera an initiate Advair HFA 2 puffs twice daily.  Educated patient on purpose, proper use, potential adverse effects including risk of esophageal candidiasis and need to rinse mouth after each use. Patient verbalized understanding of results and education. Shared plan with patient's daughter, [name], over the telephone and will send a secure email to her with patient instructions (patient consented).

## 2015-01-18 NOTE — Patient Instructions (Addendum)
Start the Advair: 2 puffs twice a day.  Stop the [facility] and the Spiriva.   They are similar medications but the Advair is cheaper for your insurance.  Next visit with Dr. Adriana Simasook

## 2015-01-19 DIAGNOSIS — N186 End stage renal disease: Secondary | ICD-10-CM | POA: Diagnosis not present

## 2015-01-22 DIAGNOSIS — E1122 Type 2 diabetes mellitus with diabetic chronic kidney disease: Secondary | ICD-10-CM | POA: Diagnosis not present

## 2015-01-22 DIAGNOSIS — N186 End stage renal disease: Secondary | ICD-10-CM | POA: Diagnosis not present

## 2015-01-22 DIAGNOSIS — Z992 Dependence on renal dialysis: Secondary | ICD-10-CM | POA: Diagnosis not present

## 2015-01-24 DIAGNOSIS — N186 End stage renal disease: Secondary | ICD-10-CM | POA: Diagnosis not present

## 2015-01-25 ENCOUNTER — Other Ambulatory Visit: Payer: Self-pay | Admitting: Family Medicine

## 2015-01-26 DIAGNOSIS — N186 End stage renal disease: Secondary | ICD-10-CM | POA: Diagnosis not present

## 2015-01-29 ENCOUNTER — Telehealth: Payer: Self-pay | Admitting: *Deleted

## 2015-01-29 DIAGNOSIS — N186 End stage renal disease: Secondary | ICD-10-CM | POA: Diagnosis not present

## 2015-01-29 NOTE — Telephone Encounter (Signed)
[facility] called stating patient need a referral from PCP.  Please give her a call at [phone] or fax [phone].  Clovis PuMartin, Tamika L, RN

## 2015-01-30 NOTE — Telephone Encounter (Signed)
Will forward to MD. Jazmin Hartsell,CMA  

## 2015-01-31 ENCOUNTER — Other Ambulatory Visit: Payer: Self-pay | Admitting: Family Medicine

## 2015-01-31 DIAGNOSIS — Z992 Dependence on renal dialysis: Principal | ICD-10-CM

## 2015-01-31 DIAGNOSIS — N186 End stage renal disease: Secondary | ICD-10-CM | POA: Diagnosis not present

## 2015-01-31 NOTE — Telephone Encounter (Signed)
Spoke with Lurena Joiner with [facility].  Patient is currently getting her treatments at the [facility] location and needs a new referral.  She now has [place] and they require an authorization from the PCP.  Their NPI# is [id number] and she is being seen for N18.6 end stage renal failure.  Once this has been completed it needs to be faxed to her at [phone]. Jazmin Hartsell,CMA

## 2015-01-31 NOTE — Telephone Encounter (Signed)
Let me know

## 2015-02-02 DIAGNOSIS — N186 End stage renal disease: Secondary | ICD-10-CM | POA: Diagnosis not present

## 2015-02-05 DIAGNOSIS — N186 End stage renal disease: Secondary | ICD-10-CM | POA: Diagnosis not present

## 2015-02-07 DIAGNOSIS — N186 End stage renal disease: Secondary | ICD-10-CM | POA: Diagnosis not present

## 2015-02-09 DIAGNOSIS — N186 End stage renal disease: Secondary | ICD-10-CM | POA: Diagnosis not present

## 2015-02-12 DIAGNOSIS — N186 End stage renal disease: Secondary | ICD-10-CM | POA: Diagnosis not present

## 2015-02-14 DIAGNOSIS — N186 End stage renal disease: Secondary | ICD-10-CM | POA: Diagnosis not present

## 2015-02-15 ENCOUNTER — Other Ambulatory Visit: Payer: Self-pay | Admitting: Family Medicine

## 2015-02-16 DIAGNOSIS — N186 End stage renal disease: Secondary | ICD-10-CM | POA: Diagnosis not present

## 2015-02-19 DIAGNOSIS — N186 End stage renal disease: Secondary | ICD-10-CM | POA: Diagnosis not present

## 2015-02-21 DIAGNOSIS — Z992 Dependence on renal dialysis: Secondary | ICD-10-CM | POA: Diagnosis not present

## 2015-02-21 DIAGNOSIS — E1122 Type 2 diabetes mellitus with diabetic chronic kidney disease: Secondary | ICD-10-CM | POA: Diagnosis not present

## 2015-02-21 DIAGNOSIS — N186 End stage renal disease: Secondary | ICD-10-CM | POA: Diagnosis not present

## 2015-02-23 DIAGNOSIS — N186 End stage renal disease: Secondary | ICD-10-CM | POA: Diagnosis not present

## 2015-02-26 DIAGNOSIS — N186 End stage renal disease: Secondary | ICD-10-CM | POA: Diagnosis not present

## 2015-02-27 ENCOUNTER — Ambulatory Visit (INDEPENDENT_AMBULATORY_CARE_PROVIDER_SITE_OTHER): Payer: Commercial Managed Care - HMO | Admitting: Family Medicine

## 2015-02-27 ENCOUNTER — Encounter: Payer: Self-pay | Admitting: Family Medicine

## 2015-02-27 VITALS — BP 165/58 | HR 80 | Temp 98.4°F | Wt 151.0 lb

## 2015-02-27 DIAGNOSIS — I10 Essential (primary) hypertension: Secondary | ICD-10-CM | POA: Diagnosis not present

## 2015-02-27 DIAGNOSIS — R2689 Other abnormalities of gait and mobility: Secondary | ICD-10-CM | POA: Insufficient documentation

## 2015-02-27 DIAGNOSIS — M25551 Pain in right hip: Secondary | ICD-10-CM | POA: Diagnosis not present

## 2015-02-27 DIAGNOSIS — E119 Type 2 diabetes mellitus without complications: Secondary | ICD-10-CM | POA: Diagnosis not present

## 2015-02-27 DIAGNOSIS — R29818 Other symptoms and signs involving the nervous system: Secondary | ICD-10-CM

## 2015-02-27 DIAGNOSIS — J984 Other disorders of lung: Secondary | ICD-10-CM

## 2015-02-27 LAB — POCT GLYCOSYLATED HEMOGLOBIN (HGB A1C): Hemoglobin A1C: 6

## 2015-02-27 NOTE — Progress Notes (Signed)
    Subjective    Valery Jenelle MagesR Mckellips is a 81 y.o. female that presents for a follow-up visit for chronic issues.   1. Hip pain: Symptoms started last week. Right sided pain. Symptoms have resolved. No reported falls. Currently not having any symptoms of pain.  2. Cough: Recently seen by Dr. Raymondo BandKoval. She is adherent with Advair. She is not having to use albuterol.  3. Off balance: Symptoms started last week. She generally leans to her left side. Patient reports no chest pain but has "dizziness". She has an aide that bathes her. She otherwise cooks, cleans and toilets herself. She states that she has difficulty hearing, but neither side appears to be worse than the other. Symptoms worse when s/p dialysis.  4. Hypertension: No chest pain, shortness of breath. Adherent with amlodipine and metoprolol.  History  Substance Use Topics  . Smoking status: Never Smoker   . Smokeless tobacco: Never Used  . Alcohol Use: No    Allergies  Allergen Reactions  . Codeine Sulfate Other (See Comments)    "It made me sick"  . Meperidine Hcl Nausea And Vomiting    REACTION: vomiting    No orders of the defined types were placed in this encounter.    ROS  Per HPI   Objective   BP 165/58 mmHg  Pulse 80  Temp(Src) 98.4 F (36.9 C) (Oral)  Wt 151 lb (68.493 kg)  General: Well appearing, no distress HEENT: No nystagmus. EOMI but slow to track Musculoskeletal: Right hip: pain with FABER, none with FADIR. Good ROM. Log roll negative. Gait slightly shuffled with no apparent limping Neuro: Alert, oriented. Normal Weber and Rinne. Negative Romberg. No dysmetria.   Assessment and Plan   Please refer to problem based charting of assessment and plan

## 2015-02-27 NOTE — Assessment & Plan Note (Addendum)
Possibly related to too much fluid off after dialysis. Does not appear to be related to inner ear or cerebellum function. Blood pressure meds could contribute as well.  Discussed talking with dialysis about blood pressure and fluid pull off  If symptoms persist, may get CBC to check blood work

## 2015-02-27 NOTE — Patient Instructions (Signed)
Thank you for coming to see me today. It was a pleasure. Today we talked about:   Right hip pain: this does not appear to be related to a fracture, but with osteoporosis and some pain on exam, I will get an xray of your hip  Cough: This is most likely related to your lung disease. Please continue the Advair as this is working for you  Balance: This may be related to dialysis if your blood pressure is dropping too low. I couldn't find anything on exam that points towards an issue with your ears or balance center of your brain (cerebellum)  Hypertension: Your blood pressure was high today, but because of your dialysis, I will not make changes. It may be beneficial to start an ACE inhibitor, but with your symptoms of balance issues, we may need to be careful with your blood pressure.  Diabetes: I will check an A1C today and we may be able to discontinue your glipizide.  Please make an appointment to see me in 3 months for follow-up.  If you have any questions or concerns, please do not hesitate to call the office at [phone].  Sincerely,  Jacquelin Hawkingalph Jerrit Horen, MD

## 2015-02-27 NOTE — Assessment & Plan Note (Signed)
Patient adherent with glipizide and Januvia. A1C of 6 today which is up from previous. Will likely try to decrease glipizide at next visit

## 2015-02-27 NOTE — Assessment & Plan Note (Signed)
Symptoms improved with Advair  Continue Advair

## 2015-02-27 NOTE — Assessment & Plan Note (Signed)
No real concern for fracture, however, with patient's diagnosis of osteoporosis, there is a possibility of small fracture. Patient still ambulating normally which is reassuring.  DG right hip/pelvis

## 2015-02-27 NOTE — Assessment & Plan Note (Signed)
Not at goal. No worrisome signs. Discussed possibly increasing blood pressure medications, however, with patient's balance issues and also since a dialysis patient, will hold off on changing regimen right now.

## 2015-02-28 DIAGNOSIS — N186 End stage renal disease: Secondary | ICD-10-CM | POA: Diagnosis not present

## 2015-03-02 DIAGNOSIS — N186 End stage renal disease: Secondary | ICD-10-CM | POA: Diagnosis not present

## 2015-03-05 DIAGNOSIS — N186 End stage renal disease: Secondary | ICD-10-CM | POA: Diagnosis not present

## 2015-03-07 DIAGNOSIS — N186 End stage renal disease: Secondary | ICD-10-CM | POA: Diagnosis not present

## 2015-03-09 DIAGNOSIS — N186 End stage renal disease: Secondary | ICD-10-CM | POA: Diagnosis not present

## 2015-03-12 DIAGNOSIS — N186 End stage renal disease: Secondary | ICD-10-CM | POA: Diagnosis not present

## 2015-03-14 DIAGNOSIS — N186 End stage renal disease: Secondary | ICD-10-CM | POA: Diagnosis not present

## 2015-03-16 DIAGNOSIS — N186 End stage renal disease: Secondary | ICD-10-CM | POA: Diagnosis not present

## 2015-03-19 DIAGNOSIS — N186 End stage renal disease: Secondary | ICD-10-CM | POA: Diagnosis not present

## 2015-03-21 DIAGNOSIS — N186 End stage renal disease: Secondary | ICD-10-CM | POA: Diagnosis not present

## 2015-03-22 DIAGNOSIS — L602 Onychogryphosis: Secondary | ICD-10-CM | POA: Diagnosis not present

## 2015-03-22 DIAGNOSIS — E1151 Type 2 diabetes mellitus with diabetic peripheral angiopathy without gangrene: Secondary | ICD-10-CM | POA: Diagnosis not present

## 2015-03-23 DIAGNOSIS — N186 End stage renal disease: Secondary | ICD-10-CM | POA: Diagnosis not present

## 2015-03-24 DIAGNOSIS — N186 End stage renal disease: Secondary | ICD-10-CM | POA: Diagnosis not present

## 2015-03-24 DIAGNOSIS — Z992 Dependence on renal dialysis: Secondary | ICD-10-CM | POA: Diagnosis not present

## 2015-03-24 DIAGNOSIS — E1122 Type 2 diabetes mellitus with diabetic chronic kidney disease: Secondary | ICD-10-CM | POA: Diagnosis not present

## 2015-03-26 DIAGNOSIS — N186 End stage renal disease: Secondary | ICD-10-CM | POA: Diagnosis not present

## 2015-03-28 DIAGNOSIS — N186 End stage renal disease: Secondary | ICD-10-CM | POA: Diagnosis not present

## 2015-03-30 DIAGNOSIS — N186 End stage renal disease: Secondary | ICD-10-CM | POA: Diagnosis not present

## 2015-04-02 DIAGNOSIS — N186 End stage renal disease: Secondary | ICD-10-CM | POA: Diagnosis not present

## 2015-04-04 DIAGNOSIS — N186 End stage renal disease: Secondary | ICD-10-CM | POA: Diagnosis not present

## 2015-04-06 DIAGNOSIS — N186 End stage renal disease: Secondary | ICD-10-CM | POA: Diagnosis not present

## 2015-04-09 DIAGNOSIS — N186 End stage renal disease: Secondary | ICD-10-CM | POA: Diagnosis not present

## 2015-04-11 DIAGNOSIS — N186 End stage renal disease: Secondary | ICD-10-CM | POA: Diagnosis not present

## 2015-04-13 DIAGNOSIS — N186 End stage renal disease: Secondary | ICD-10-CM | POA: Diagnosis not present

## 2015-04-16 DIAGNOSIS — N186 End stage renal disease: Secondary | ICD-10-CM | POA: Diagnosis not present

## 2015-04-18 DIAGNOSIS — N186 End stage renal disease: Secondary | ICD-10-CM | POA: Diagnosis not present

## 2015-04-20 DIAGNOSIS — N186 End stage renal disease: Secondary | ICD-10-CM | POA: Diagnosis not present

## 2015-04-23 DIAGNOSIS — N186 End stage renal disease: Secondary | ICD-10-CM | POA: Diagnosis not present

## 2015-04-24 DIAGNOSIS — E1122 Type 2 diabetes mellitus with diabetic chronic kidney disease: Secondary | ICD-10-CM | POA: Diagnosis not present

## 2015-04-24 DIAGNOSIS — Z992 Dependence on renal dialysis: Secondary | ICD-10-CM | POA: Diagnosis not present

## 2015-04-24 DIAGNOSIS — N186 End stage renal disease: Secondary | ICD-10-CM | POA: Diagnosis not present

## 2015-04-25 DIAGNOSIS — N186 End stage renal disease: Secondary | ICD-10-CM | POA: Diagnosis not present

## 2015-04-27 DIAGNOSIS — N186 End stage renal disease: Secondary | ICD-10-CM | POA: Diagnosis not present

## 2015-04-30 DIAGNOSIS — N186 End stage renal disease: Secondary | ICD-10-CM | POA: Diagnosis not present

## 2015-05-02 DIAGNOSIS — N186 End stage renal disease: Secondary | ICD-10-CM | POA: Diagnosis not present

## 2015-05-04 DIAGNOSIS — N186 End stage renal disease: Secondary | ICD-10-CM | POA: Diagnosis not present

## 2015-05-07 DIAGNOSIS — N186 End stage renal disease: Secondary | ICD-10-CM | POA: Diagnosis not present

## 2015-05-09 DIAGNOSIS — N186 End stage renal disease: Secondary | ICD-10-CM | POA: Diagnosis not present

## 2015-05-11 DIAGNOSIS — N186 End stage renal disease: Secondary | ICD-10-CM | POA: Diagnosis not present

## 2015-05-14 DIAGNOSIS — N186 End stage renal disease: Secondary | ICD-10-CM | POA: Diagnosis not present

## 2015-05-16 DIAGNOSIS — N186 End stage renal disease: Secondary | ICD-10-CM | POA: Diagnosis not present

## 2015-05-18 DIAGNOSIS — N186 End stage renal disease: Secondary | ICD-10-CM | POA: Diagnosis not present

## 2015-05-21 DIAGNOSIS — N186 End stage renal disease: Secondary | ICD-10-CM | POA: Diagnosis not present

## 2015-05-23 DIAGNOSIS — N186 End stage renal disease: Secondary | ICD-10-CM | POA: Diagnosis not present

## 2015-05-24 DIAGNOSIS — E1122 Type 2 diabetes mellitus with diabetic chronic kidney disease: Secondary | ICD-10-CM | POA: Diagnosis not present

## 2015-05-24 DIAGNOSIS — Z992 Dependence on renal dialysis: Secondary | ICD-10-CM | POA: Diagnosis not present

## 2015-05-24 DIAGNOSIS — N186 End stage renal disease: Secondary | ICD-10-CM | POA: Diagnosis not present

## 2015-05-25 DIAGNOSIS — N186 End stage renal disease: Secondary | ICD-10-CM | POA: Diagnosis not present

## 2015-05-28 DIAGNOSIS — N186 End stage renal disease: Secondary | ICD-10-CM | POA: Diagnosis not present

## 2015-05-30 DIAGNOSIS — N186 End stage renal disease: Secondary | ICD-10-CM | POA: Diagnosis not present

## 2015-06-01 DIAGNOSIS — N186 End stage renal disease: Secondary | ICD-10-CM | POA: Diagnosis not present

## 2015-06-04 DIAGNOSIS — N186 End stage renal disease: Secondary | ICD-10-CM | POA: Diagnosis not present

## 2015-06-06 DIAGNOSIS — N186 End stage renal disease: Secondary | ICD-10-CM | POA: Diagnosis not present

## 2015-06-08 DIAGNOSIS — N186 End stage renal disease: Secondary | ICD-10-CM | POA: Diagnosis not present

## 2015-06-11 DIAGNOSIS — N186 End stage renal disease: Secondary | ICD-10-CM | POA: Diagnosis not present

## 2015-06-13 DIAGNOSIS — N186 End stage renal disease: Secondary | ICD-10-CM | POA: Diagnosis not present

## 2015-06-14 ENCOUNTER — Other Ambulatory Visit: Payer: Self-pay | Admitting: Family Medicine

## 2015-06-14 ENCOUNTER — Other Ambulatory Visit: Payer: Self-pay | Admitting: *Deleted

## 2015-06-14 NOTE — Telephone Encounter (Signed)
Pt needs a refill on ranitidine (ZANTAC) 150 MG tablet and JANUVIA 25 MG tablet [Pharmacy Med Name: JANUVIA 25 MG to [facility]. Thank you, Dorothey BasemanSadie Reynolds, ASA

## 2015-06-15 DIAGNOSIS — N186 End stage renal disease: Secondary | ICD-10-CM | POA: Diagnosis not present

## 2015-06-16 MED ORDER — SITAGLIPTIN PHOSPHATE 25 MG PO TABS: 25.0000 mg | ORAL_TABLET | Freq: Every day | ORAL | Status: DC

## 2015-06-16 MED ORDER — RANITIDINE HCL 150 MG PO TABS: 150.0000 mg | ORAL_TABLET | Freq: Two times a day (BID) | ORAL | Status: DC

## 2015-06-17 NOTE — Telephone Encounter (Signed)
Prescriptions sent yesterday 10/23

## 2015-06-18 DIAGNOSIS — N186 End stage renal disease: Secondary | ICD-10-CM | POA: Diagnosis not present

## 2015-06-19 DIAGNOSIS — E1151 Type 2 diabetes mellitus with diabetic peripheral angiopathy without gangrene: Secondary | ICD-10-CM | POA: Diagnosis not present

## 2015-06-19 DIAGNOSIS — L602 Onychogryphosis: Secondary | ICD-10-CM | POA: Diagnosis not present

## 2015-06-20 DIAGNOSIS — N186 End stage renal disease: Secondary | ICD-10-CM | POA: Diagnosis not present

## 2015-06-22 DIAGNOSIS — N186 End stage renal disease: Secondary | ICD-10-CM | POA: Diagnosis not present

## 2015-06-24 DIAGNOSIS — N186 End stage renal disease: Secondary | ICD-10-CM | POA: Diagnosis not present

## 2015-06-24 DIAGNOSIS — Z992 Dependence on renal dialysis: Secondary | ICD-10-CM | POA: Diagnosis not present

## 2015-06-24 DIAGNOSIS — E1122 Type 2 diabetes mellitus with diabetic chronic kidney disease: Secondary | ICD-10-CM | POA: Diagnosis not present

## 2015-06-25 DIAGNOSIS — E8779 Other fluid overload: Secondary | ICD-10-CM | POA: Diagnosis not present

## 2015-06-25 DIAGNOSIS — N186 End stage renal disease: Secondary | ICD-10-CM | POA: Diagnosis not present

## 2015-06-26 ENCOUNTER — Ambulatory Visit (INDEPENDENT_AMBULATORY_CARE_PROVIDER_SITE_OTHER): Payer: Commercial Managed Care - HMO | Admitting: Family Medicine

## 2015-06-26 ENCOUNTER — Encounter: Payer: Self-pay | Admitting: Family Medicine

## 2015-06-26 VITALS — BP 120/64 | HR 79 | Temp 98.4°F | Ht 66.0 in | Wt 152.0 lb

## 2015-06-26 DIAGNOSIS — Z992 Dependence on renal dialysis: Secondary | ICD-10-CM

## 2015-06-26 DIAGNOSIS — E785 Hyperlipidemia, unspecified: Secondary | ICD-10-CM

## 2015-06-26 DIAGNOSIS — N186 End stage renal disease: Secondary | ICD-10-CM | POA: Diagnosis not present

## 2015-06-26 DIAGNOSIS — J984 Other disorders of lung: Secondary | ICD-10-CM | POA: Diagnosis not present

## 2015-06-26 DIAGNOSIS — I10 Essential (primary) hypertension: Secondary | ICD-10-CM | POA: Diagnosis not present

## 2015-06-26 DIAGNOSIS — E1122 Type 2 diabetes mellitus with diabetic chronic kidney disease: Secondary | ICD-10-CM | POA: Diagnosis not present

## 2015-06-26 LAB — POCT GLYCOSYLATED HEMOGLOBIN (HGB A1C): Hemoglobin A1C: 7

## 2015-06-26 NOTE — Patient Instructions (Signed)
Thank you so much for coming to visit me today! Your A1C is up from your last visit from 6 to 7, however since your sugar drops low occasionally I am not going to change your medications. Please continue to work on diet and exercise when able. I will look into which inhalers your insurance company covers and try to send you a prescription for an alternative medication to the pharmacy over the next several days.  Please let me know if there's anything else we can do for you!  Thanks again! Dr. Caroleen Hammanumley

## 2015-06-27 DIAGNOSIS — E8779 Other fluid overload: Secondary | ICD-10-CM | POA: Diagnosis not present

## 2015-06-27 DIAGNOSIS — N186 End stage renal disease: Secondary | ICD-10-CM | POA: Diagnosis not present

## 2015-06-29 DIAGNOSIS — E8779 Other fluid overload: Secondary | ICD-10-CM | POA: Diagnosis not present

## 2015-06-29 DIAGNOSIS — N186 End stage renal disease: Secondary | ICD-10-CM | POA: Diagnosis not present

## 2015-06-29 NOTE — Assessment & Plan Note (Addendum)
-   Seems to be some confusion concerning which medications she is taking vs. what she is prescribed. Will contact while patient at home to clarify. - Prescription for Advair and Spiriva currently ordered, however patient [facility] taking [facility], which was discontinued in May.

## 2015-06-29 NOTE — Assessment & Plan Note (Signed)
Continue Atorvastatin

## 2015-06-29 NOTE — Assessment & Plan Note (Addendum)
-   A1C increased to 7 from 6 - Continue current medications - Discussed importance of small snacks before bed and symptoms of hypoglycemia.  - Continue to work on diet and exercise - Follow up in 6 months for A1C check

## 2015-06-29 NOTE — Progress Notes (Signed)
Subjective:     Patient ID: Claudia Rich, female   DOB: 1927/08/21, 81 y.o.   MRN: [id number]  HPI Claudia Rich is an 81yo female presenting today for exam prior to leaving for an extended trip and to meet new PCP at this facility. - Traveling to see her daughter for several months. - No acute concerns today - Note that med rec not done today. Patient and family member that presented with her were unsure which medications she is on. - Does not smoke  # Hypertension: - Currently prescribed Metoprolol and Norvasc  # Diabetes: - Last A1C 6 in 01/2015 - Currently prescribed Glipizide and Januvia - States sugars are normally in 90s, but did drop to 40s once last month.  # Hyperlipidemia: - Currently prescribed Atorvastatin  # Chronic Restrictive Lung Disease: - Spirometry with moderate restrictive disease with reversibility - Currently prescribed Advair - States [name] is not longer covered by her insurance and requests another medication. Chart review shows Claudia Rich was transitioned to Advair [name] for this reason on 01/18/15 and [name] was discontinued at that time.  # End Stage Renal Disease: - [name] on Tuesday, Thursday, Saturdays - States they gave her a flu shot at her Dialysis center and she does not need one today  Review of Systems Per HPI    Objective:   Physical Exam  Constitutional: She appears well-developed and well-nourished. No distress.  HENT:  Head: Normocephalic and atraumatic.  Cardiovascular: Normal rate and regular rhythm.  Exam reveals no gallop and no friction rub.   No murmur heard. Pulmonary/Chest: Effort normal. No respiratory distress. She has no wheezes.  Abdominal: Soft. She exhibits no distension. There is no tenderness.  Musculoskeletal: She exhibits no edema.  Skin: Skin is warm. No rash noted.  Psychiatric: She has a normal mood and affect. Her behavior is normal.       Assessment and Plan:      Essential hypertension, benign - Stable - Continue Metoprolol and Norvasc  Chronic restrictive lung disease - Seems to be some confusion concerning which medications she is taking vs. what she is prescribed. Will contact while patient at home to clarify. - Prescription for Advair and Spiriva currently ordered, however patient reports taking [name], which was discontinued in May.  DM2 (diabetes mellitus, type 2) - A1C increased to 7 from 6 - Continue current medications - Discussed importance of small snacks before bed and symptoms of hypoglycemia.  - Continue to work on diet and exercise - Follow up in 6 months for A1C check  Hyperlipidemia - Continue Atorvastatin

## 2015-06-29 NOTE — Assessment & Plan Note (Signed)
-   Stable - Continue Metoprolol and Norvasc

## 2015-07-02 ENCOUNTER — Telehealth: Payer: Self-pay | Admitting: Family Medicine

## 2015-07-02 DIAGNOSIS — E8779 Other fluid overload: Secondary | ICD-10-CM | POA: Diagnosis not present

## 2015-07-02 DIAGNOSIS — N186 End stage renal disease: Secondary | ICD-10-CM | POA: Diagnosis not present

## 2015-07-02 NOTE — Telephone Encounter (Signed)
Niece needs to know which inhaler will be covered by insurance.  Needs a replacement  Inhaler for dulara,  Niece was told spirva was disontinued because she was on dulara Nurse from dialysis just called in zirthomax for her bad cough.  She will be on this for 5 days. Please advise

## 2015-07-03 MED ORDER — FLUTICASONE-SALMETEROL 230-21 MCG/ACT IN AERO: 2.0000 | INHALATION_SPRAY | Freq: Two times a day (BID) | RESPIRATORY_TRACT | Status: DC

## 2015-07-03 NOTE — Telephone Encounter (Signed)
Will transition from [facility] to Advair HFA two puffs twice daily for insurance coverage.  Dr. Caroleen Hammanumley

## 2015-07-04 DIAGNOSIS — E8779 Other fluid overload: Secondary | ICD-10-CM | POA: Diagnosis not present

## 2015-07-04 DIAGNOSIS — N186 End stage renal disease: Secondary | ICD-10-CM | POA: Diagnosis not present

## 2015-07-06 DIAGNOSIS — N186 End stage renal disease: Secondary | ICD-10-CM | POA: Diagnosis not present

## 2015-07-06 DIAGNOSIS — E8779 Other fluid overload: Secondary | ICD-10-CM | POA: Diagnosis not present

## 2015-07-09 DIAGNOSIS — E8779 Other fluid overload: Secondary | ICD-10-CM | POA: Diagnosis not present

## 2015-07-09 DIAGNOSIS — N186 End stage renal disease: Secondary | ICD-10-CM | POA: Diagnosis not present

## 2015-07-11 DIAGNOSIS — N186 End stage renal disease: Secondary | ICD-10-CM | POA: Diagnosis not present

## 2015-07-11 DIAGNOSIS — E8779 Other fluid overload: Secondary | ICD-10-CM | POA: Diagnosis not present

## 2015-07-13 DIAGNOSIS — E8779 Other fluid overload: Secondary | ICD-10-CM | POA: Diagnosis not present

## 2015-07-13 DIAGNOSIS — N186 End stage renal disease: Secondary | ICD-10-CM | POA: Diagnosis not present

## 2015-07-16 DIAGNOSIS — E8779 Other fluid overload: Secondary | ICD-10-CM | POA: Diagnosis not present

## 2015-07-16 DIAGNOSIS — N186 End stage renal disease: Secondary | ICD-10-CM | POA: Diagnosis not present

## 2015-07-17 DIAGNOSIS — E8779 Other fluid overload: Secondary | ICD-10-CM | POA: Diagnosis not present

## 2015-07-17 DIAGNOSIS — N186 End stage renal disease: Secondary | ICD-10-CM | POA: Diagnosis not present

## 2015-07-19 DIAGNOSIS — E8779 Other fluid overload: Secondary | ICD-10-CM | POA: Diagnosis not present

## 2015-07-19 DIAGNOSIS — N186 End stage renal disease: Secondary | ICD-10-CM | POA: Diagnosis not present

## 2015-07-21 DIAGNOSIS — N186 End stage renal disease: Secondary | ICD-10-CM | POA: Diagnosis not present

## 2015-07-21 DIAGNOSIS — E8779 Other fluid overload: Secondary | ICD-10-CM | POA: Diagnosis not present

## 2015-07-23 DIAGNOSIS — N186 End stage renal disease: Secondary | ICD-10-CM | POA: Diagnosis not present

## 2015-07-23 DIAGNOSIS — E8779 Other fluid overload: Secondary | ICD-10-CM | POA: Diagnosis not present

## 2015-07-24 DIAGNOSIS — E1122 Type 2 diabetes mellitus with diabetic chronic kidney disease: Secondary | ICD-10-CM | POA: Diagnosis not present

## 2015-07-24 DIAGNOSIS — Z992 Dependence on renal dialysis: Secondary | ICD-10-CM | POA: Diagnosis not present

## 2015-07-24 DIAGNOSIS — N186 End stage renal disease: Secondary | ICD-10-CM | POA: Diagnosis not present

## 2015-07-25 DIAGNOSIS — Z992 Dependence on renal dialysis: Secondary | ICD-10-CM | POA: Diagnosis not present

## 2015-07-25 DIAGNOSIS — N186 End stage renal disease: Secondary | ICD-10-CM | POA: Diagnosis not present

## 2015-07-27 DIAGNOSIS — N186 End stage renal disease: Secondary | ICD-10-CM | POA: Diagnosis not present

## 2015-07-30 DIAGNOSIS — N186 End stage renal disease: Secondary | ICD-10-CM | POA: Diagnosis not present

## 2015-08-01 DIAGNOSIS — N186 End stage renal disease: Secondary | ICD-10-CM | POA: Diagnosis not present

## 2015-08-02 ENCOUNTER — Telehealth: Payer: Self-pay | Admitting: Family Medicine

## 2015-08-02 NOTE — Telephone Encounter (Signed)
Marylu LundJanet from [facility] called because the need an out of network referral for the patient to receive dialysis [place]. Please fax the referral since the have no access to a portal. [phone] attention Marylu LundJanet. If you have any questions please call Marylu LundJanet at [phone], Myriam Jacobsonjw

## 2015-08-03 DIAGNOSIS — N186 End stage renal disease: Secondary | ICD-10-CM | POA: Diagnosis not present

## 2015-08-05 NOTE — Telephone Encounter (Signed)
Will forward to referral coordinator to see if a new referral needs to be placed by provider before it can be processed online.  Jazmin Hartsell,CMA

## 2015-08-06 DIAGNOSIS — N186 End stage renal disease: Secondary | ICD-10-CM | POA: Diagnosis not present

## 2015-08-08 DIAGNOSIS — N186 End stage renal disease: Secondary | ICD-10-CM | POA: Diagnosis not present

## 2015-08-08 NOTE — Telephone Encounter (Signed)
Waiting on response from [facility] on how deal with this out of state issue.

## 2015-08-08 NOTE — Telephone Encounter (Signed)
Marylu LundJanet called back to check the status of the referral and when this will be approved. Please send to the doctor so that Tia can get the process started. jw

## 2015-08-10 DIAGNOSIS — N186 End stage renal disease: Secondary | ICD-10-CM | POA: Diagnosis not present

## 2015-08-12 NOTE — Telephone Encounter (Signed)
[facility] Auth # [id number]. Approved for 6 months and 72 visits. Faxed to [employer] [facility].

## 2015-08-13 DIAGNOSIS — N186 End stage renal disease: Secondary | ICD-10-CM | POA: Diagnosis not present

## 2015-08-14 ENCOUNTER — Other Ambulatory Visit: Payer: Self-pay | Admitting: *Deleted

## 2015-08-14 MED ORDER — SITAGLIPTIN PHOSPHATE 25 MG PO TABS: 25.0000 mg | ORAL_TABLET | Freq: Every day | ORAL | Status: DC

## 2015-08-15 DIAGNOSIS — N186 End stage renal disease: Secondary | ICD-10-CM | POA: Diagnosis not present

## 2015-08-17 DIAGNOSIS — N186 End stage renal disease: Secondary | ICD-10-CM | POA: Diagnosis not present

## 2015-08-20 DIAGNOSIS — N186 End stage renal disease: Secondary | ICD-10-CM | POA: Diagnosis not present

## 2015-08-22 DIAGNOSIS — N186 End stage renal disease: Secondary | ICD-10-CM | POA: Diagnosis not present

## 2015-08-24 DIAGNOSIS — N186 End stage renal disease: Secondary | ICD-10-CM | POA: Diagnosis not present

## 2015-08-24 DIAGNOSIS — E1122 Type 2 diabetes mellitus with diabetic chronic kidney disease: Secondary | ICD-10-CM | POA: Diagnosis not present

## 2015-08-24 DIAGNOSIS — Z992 Dependence on renal dialysis: Secondary | ICD-10-CM | POA: Diagnosis not present

## 2015-08-25 DIAGNOSIS — N186 End stage renal disease: Secondary | ICD-10-CM | POA: Diagnosis not present

## 2015-08-25 DIAGNOSIS — Z992 Dependence on renal dialysis: Secondary | ICD-10-CM | POA: Diagnosis not present

## 2015-08-27 DIAGNOSIS — N186 End stage renal disease: Secondary | ICD-10-CM | POA: Diagnosis not present

## 2015-08-29 DIAGNOSIS — N186 End stage renal disease: Secondary | ICD-10-CM | POA: Diagnosis not present

## 2015-08-31 DIAGNOSIS — N186 End stage renal disease: Secondary | ICD-10-CM | POA: Diagnosis not present

## 2015-09-03 DIAGNOSIS — N186 End stage renal disease: Secondary | ICD-10-CM | POA: Diagnosis not present

## 2015-09-04 DIAGNOSIS — J209 Acute bronchitis, unspecified: Secondary | ICD-10-CM | POA: Diagnosis not present

## 2015-09-05 DIAGNOSIS — N186 End stage renal disease: Secondary | ICD-10-CM | POA: Diagnosis not present

## 2015-09-07 DIAGNOSIS — N186 End stage renal disease: Secondary | ICD-10-CM | POA: Diagnosis not present

## 2015-09-10 DIAGNOSIS — N186 End stage renal disease: Secondary | ICD-10-CM | POA: Diagnosis not present

## 2015-09-12 DIAGNOSIS — N186 End stage renal disease: Secondary | ICD-10-CM | POA: Diagnosis not present

## 2015-09-14 DIAGNOSIS — N186 End stage renal disease: Secondary | ICD-10-CM | POA: Diagnosis not present

## 2015-09-17 DIAGNOSIS — N186 End stage renal disease: Secondary | ICD-10-CM | POA: Diagnosis not present

## 2015-09-19 DIAGNOSIS — N186 End stage renal disease: Secondary | ICD-10-CM | POA: Diagnosis not present

## 2015-09-21 DIAGNOSIS — N186 End stage renal disease: Secondary | ICD-10-CM | POA: Diagnosis not present

## 2015-09-24 DIAGNOSIS — N186 End stage renal disease: Secondary | ICD-10-CM | POA: Diagnosis not present

## 2015-09-25 DIAGNOSIS — Z992 Dependence on renal dialysis: Secondary | ICD-10-CM | POA: Diagnosis not present

## 2015-09-25 DIAGNOSIS — N186 End stage renal disease: Secondary | ICD-10-CM | POA: Diagnosis not present

## 2015-09-26 DIAGNOSIS — R55 Syncope and collapse: Secondary | ICD-10-CM | POA: Diagnosis not present

## 2015-09-26 DIAGNOSIS — N186 End stage renal disease: Secondary | ICD-10-CM | POA: Diagnosis not present

## 2015-09-26 DIAGNOSIS — Z992 Dependence on renal dialysis: Secondary | ICD-10-CM | POA: Diagnosis not present

## 2015-09-26 DIAGNOSIS — J449 Chronic obstructive pulmonary disease, unspecified: Secondary | ICD-10-CM | POA: Diagnosis not present

## 2015-09-26 DIAGNOSIS — Z743 Need for continuous supervision: Secondary | ICD-10-CM | POA: Diagnosis not present

## 2015-09-26 DIAGNOSIS — I2510 Atherosclerotic heart disease of native coronary artery without angina pectoris: Secondary | ICD-10-CM | POA: Diagnosis not present

## 2015-09-26 DIAGNOSIS — Z8673 Personal history of transient ischemic attack (TIA), and cerebral infarction without residual deficits: Secondary | ICD-10-CM | POA: Diagnosis not present

## 2015-09-26 DIAGNOSIS — R05 Cough: Secondary | ICD-10-CM | POA: Diagnosis not present

## 2015-09-26 DIAGNOSIS — R42 Dizziness and giddiness: Secondary | ICD-10-CM | POA: Diagnosis not present

## 2015-09-26 DIAGNOSIS — E119 Type 2 diabetes mellitus without complications: Secondary | ICD-10-CM | POA: Diagnosis not present

## 2015-09-26 DIAGNOSIS — I120 Hypertensive chronic kidney disease with stage 5 chronic kidney disease or end stage renal disease: Secondary | ICD-10-CM | POA: Diagnosis not present

## 2015-09-28 DIAGNOSIS — N186 End stage renal disease: Secondary | ICD-10-CM | POA: Diagnosis not present

## 2015-10-01 DIAGNOSIS — N186 End stage renal disease: Secondary | ICD-10-CM | POA: Diagnosis not present

## 2015-10-03 DIAGNOSIS — N186 End stage renal disease: Secondary | ICD-10-CM | POA: Diagnosis not present

## 2015-10-05 DIAGNOSIS — N186 End stage renal disease: Secondary | ICD-10-CM | POA: Diagnosis not present

## 2015-10-08 DIAGNOSIS — N186 End stage renal disease: Secondary | ICD-10-CM | POA: Diagnosis not present

## 2015-10-10 DIAGNOSIS — N186 End stage renal disease: Secondary | ICD-10-CM | POA: Diagnosis not present

## 2015-10-12 DIAGNOSIS — N186 End stage renal disease: Secondary | ICD-10-CM | POA: Diagnosis not present

## 2015-10-15 DIAGNOSIS — N186 End stage renal disease: Secondary | ICD-10-CM | POA: Diagnosis not present

## 2015-10-17 DIAGNOSIS — N186 End stage renal disease: Secondary | ICD-10-CM | POA: Diagnosis not present

## 2015-10-19 DIAGNOSIS — N186 End stage renal disease: Secondary | ICD-10-CM | POA: Diagnosis not present

## 2015-10-22 DIAGNOSIS — N186 End stage renal disease: Secondary | ICD-10-CM | POA: Diagnosis not present

## 2015-10-24 DIAGNOSIS — N186 End stage renal disease: Secondary | ICD-10-CM | POA: Diagnosis not present

## 2015-10-26 DIAGNOSIS — N186 End stage renal disease: Secondary | ICD-10-CM | POA: Diagnosis not present

## 2015-10-29 ENCOUNTER — Other Ambulatory Visit: Payer: Self-pay | Admitting: *Deleted

## 2015-10-29 DIAGNOSIS — N186 End stage renal disease: Secondary | ICD-10-CM | POA: Diagnosis not present

## 2015-10-30 ENCOUNTER — Other Ambulatory Visit: Payer: Self-pay | Admitting: *Deleted

## 2015-10-30 MED ORDER — RANITIDINE HCL 150 MG PO TABS: 150.0000 mg | ORAL_TABLET | Freq: Two times a day (BID) | ORAL | Status: DC

## 2015-10-30 MED ORDER — RANITIDINE HCL 150 MG PO CAPS: 150.0000 mg | ORAL_CAPSULE | Freq: Two times a day (BID) | ORAL | Status: AC

## 2015-10-31 DIAGNOSIS — N186 End stage renal disease: Secondary | ICD-10-CM | POA: Diagnosis not present

## 2015-11-01 ENCOUNTER — Other Ambulatory Visit: Payer: Self-pay | Admitting: *Deleted

## 2015-11-02 ENCOUNTER — Other Ambulatory Visit: Payer: Self-pay | Admitting: Family Medicine

## 2015-11-02 DIAGNOSIS — N186 End stage renal disease: Secondary | ICD-10-CM | POA: Diagnosis not present

## 2015-11-04 ENCOUNTER — Other Ambulatory Visit: Payer: Self-pay | Admitting: Family Medicine

## 2015-11-04 MED ORDER — GLIPIZIDE 10 MG PO TABS: ORAL_TABLET | ORAL | Status: DC

## 2015-11-05 DIAGNOSIS — Z992 Dependence on renal dialysis: Secondary | ICD-10-CM | POA: Diagnosis not present

## 2015-11-05 DIAGNOSIS — N186 End stage renal disease: Secondary | ICD-10-CM | POA: Diagnosis not present

## 2015-11-05 DIAGNOSIS — E1122 Type 2 diabetes mellitus with diabetic chronic kidney disease: Secondary | ICD-10-CM | POA: Diagnosis not present

## 2015-11-07 DIAGNOSIS — N186 End stage renal disease: Secondary | ICD-10-CM | POA: Diagnosis not present

## 2015-11-09 DIAGNOSIS — N186 End stage renal disease: Secondary | ICD-10-CM | POA: Diagnosis not present

## 2015-11-12 DIAGNOSIS — N186 End stage renal disease: Secondary | ICD-10-CM | POA: Diagnosis not present

## 2015-11-14 DIAGNOSIS — N186 End stage renal disease: Secondary | ICD-10-CM | POA: Diagnosis not present

## 2015-11-16 DIAGNOSIS — N186 End stage renal disease: Secondary | ICD-10-CM | POA: Diagnosis not present

## 2015-11-19 ENCOUNTER — Telehealth: Payer: Self-pay | Admitting: Family Medicine

## 2015-11-19 DIAGNOSIS — N186 End stage renal disease: Secondary | ICD-10-CM | POA: Diagnosis not present

## 2015-11-19 NOTE — Telephone Encounter (Signed)
Will forward to MD to place this referral and also to referral coordinator. Arley Garant,CMA

## 2015-11-19 NOTE — Telephone Encounter (Signed)
Need a referral to [facility] for tomorrow's appt @10 :00.  She will be seeing Dr. Ludger NuttingN'Tuma Jah

## 2015-11-19 NOTE — Telephone Encounter (Signed)
[facility] for patient's appt tomorrow.

## 2015-11-19 NOTE — Telephone Encounter (Signed)
Auth # [id number] 11/20/15 TO 05/21/16 Approved for 6 visits.

## 2015-11-20 DIAGNOSIS — E1351 Other specified diabetes mellitus with diabetic peripheral angiopathy without gangrene: Secondary | ICD-10-CM | POA: Diagnosis not present

## 2015-11-20 DIAGNOSIS — L84 Corns and callosities: Secondary | ICD-10-CM | POA: Diagnosis not present

## 2015-11-20 DIAGNOSIS — L602 Onychogryphosis: Secondary | ICD-10-CM | POA: Diagnosis not present

## 2015-11-21 DIAGNOSIS — N186 End stage renal disease: Secondary | ICD-10-CM | POA: Diagnosis not present

## 2015-11-23 DIAGNOSIS — N186 End stage renal disease: Secondary | ICD-10-CM | POA: Diagnosis not present

## 2015-11-26 DIAGNOSIS — N186 End stage renal disease: Secondary | ICD-10-CM | POA: Diagnosis not present

## 2015-11-28 DIAGNOSIS — N186 End stage renal disease: Secondary | ICD-10-CM | POA: Diagnosis not present

## 2015-11-30 DIAGNOSIS — N186 End stage renal disease: Secondary | ICD-10-CM | POA: Diagnosis not present

## 2015-12-02 DIAGNOSIS — H02831 Dermatochalasis of right upper eyelid: Secondary | ICD-10-CM | POA: Diagnosis not present

## 2015-12-02 DIAGNOSIS — Z961 Presence of intraocular lens: Secondary | ICD-10-CM | POA: Diagnosis not present

## 2015-12-02 DIAGNOSIS — H53461 Homonymous bilateral field defects, right side: Secondary | ICD-10-CM | POA: Diagnosis not present

## 2015-12-02 DIAGNOSIS — E113293 Type 2 diabetes mellitus with mild nonproliferative diabetic retinopathy without macular edema, bilateral: Secondary | ICD-10-CM | POA: Diagnosis not present

## 2015-12-02 DIAGNOSIS — H02834 Dermatochalasis of left upper eyelid: Secondary | ICD-10-CM | POA: Diagnosis not present

## 2015-12-03 DIAGNOSIS — N186 End stage renal disease: Secondary | ICD-10-CM | POA: Diagnosis not present

## 2015-12-05 DIAGNOSIS — N186 End stage renal disease: Secondary | ICD-10-CM | POA: Diagnosis not present

## 2015-12-07 DIAGNOSIS — N186 End stage renal disease: Secondary | ICD-10-CM | POA: Diagnosis not present

## 2015-12-10 DIAGNOSIS — N186 End stage renal disease: Secondary | ICD-10-CM | POA: Diagnosis not present

## 2015-12-11 ENCOUNTER — Other Ambulatory Visit: Payer: Self-pay | Admitting: Family Medicine

## 2015-12-12 DIAGNOSIS — N186 End stage renal disease: Secondary | ICD-10-CM | POA: Diagnosis not present

## 2015-12-14 DIAGNOSIS — N186 End stage renal disease: Secondary | ICD-10-CM | POA: Diagnosis not present

## 2015-12-17 DIAGNOSIS — N186 End stage renal disease: Secondary | ICD-10-CM | POA: Diagnosis not present

## 2015-12-19 DIAGNOSIS — N186 End stage renal disease: Secondary | ICD-10-CM | POA: Diagnosis not present

## 2015-12-21 DIAGNOSIS — N186 End stage renal disease: Secondary | ICD-10-CM | POA: Diagnosis not present

## 2015-12-22 DIAGNOSIS — Z992 Dependence on renal dialysis: Secondary | ICD-10-CM | POA: Diagnosis not present

## 2015-12-22 DIAGNOSIS — N186 End stage renal disease: Secondary | ICD-10-CM | POA: Diagnosis not present

## 2015-12-22 DIAGNOSIS — E1122 Type 2 diabetes mellitus with diabetic chronic kidney disease: Secondary | ICD-10-CM | POA: Diagnosis not present

## 2015-12-24 DIAGNOSIS — E8770 Fluid overload, unspecified: Secondary | ICD-10-CM | POA: Diagnosis not present

## 2015-12-24 DIAGNOSIS — N186 End stage renal disease: Secondary | ICD-10-CM | POA: Diagnosis not present

## 2015-12-25 ENCOUNTER — Other Ambulatory Visit: Payer: Self-pay | Admitting: Family Medicine

## 2015-12-26 ENCOUNTER — Other Ambulatory Visit: Payer: Self-pay | Admitting: Family Medicine

## 2015-12-26 DIAGNOSIS — N186 End stage renal disease: Secondary | ICD-10-CM | POA: Diagnosis not present

## 2015-12-26 DIAGNOSIS — E8770 Fluid overload, unspecified: Secondary | ICD-10-CM | POA: Diagnosis not present

## 2015-12-28 DIAGNOSIS — E8770 Fluid overload, unspecified: Secondary | ICD-10-CM | POA: Diagnosis not present

## 2015-12-28 DIAGNOSIS — N186 End stage renal disease: Secondary | ICD-10-CM | POA: Diagnosis not present

## 2015-12-31 DIAGNOSIS — E8770 Fluid overload, unspecified: Secondary | ICD-10-CM | POA: Diagnosis not present

## 2015-12-31 DIAGNOSIS — N186 End stage renal disease: Secondary | ICD-10-CM | POA: Diagnosis not present

## 2016-01-02 DIAGNOSIS — E8770 Fluid overload, unspecified: Secondary | ICD-10-CM | POA: Diagnosis not present

## 2016-01-02 DIAGNOSIS — N186 End stage renal disease: Secondary | ICD-10-CM | POA: Diagnosis not present

## 2016-01-04 DIAGNOSIS — N186 End stage renal disease: Secondary | ICD-10-CM | POA: Diagnosis not present

## 2016-01-04 DIAGNOSIS — E8770 Fluid overload, unspecified: Secondary | ICD-10-CM | POA: Diagnosis not present

## 2016-01-07 DIAGNOSIS — N186 End stage renal disease: Secondary | ICD-10-CM | POA: Diagnosis not present

## 2016-01-07 DIAGNOSIS — E8770 Fluid overload, unspecified: Secondary | ICD-10-CM | POA: Diagnosis not present

## 2016-01-09 DIAGNOSIS — E8770 Fluid overload, unspecified: Secondary | ICD-10-CM | POA: Diagnosis not present

## 2016-01-09 DIAGNOSIS — N186 End stage renal disease: Secondary | ICD-10-CM | POA: Diagnosis not present

## 2016-01-11 DIAGNOSIS — E8770 Fluid overload, unspecified: Secondary | ICD-10-CM | POA: Diagnosis not present

## 2016-01-11 DIAGNOSIS — N186 End stage renal disease: Secondary | ICD-10-CM | POA: Diagnosis not present

## 2016-01-14 DIAGNOSIS — N186 End stage renal disease: Secondary | ICD-10-CM | POA: Diagnosis not present

## 2016-01-14 DIAGNOSIS — E8770 Fluid overload, unspecified: Secondary | ICD-10-CM | POA: Diagnosis not present

## 2016-01-16 DIAGNOSIS — E8770 Fluid overload, unspecified: Secondary | ICD-10-CM | POA: Diagnosis not present

## 2016-01-16 DIAGNOSIS — N186 End stage renal disease: Secondary | ICD-10-CM | POA: Diagnosis not present

## 2016-01-17 ENCOUNTER — Ambulatory Visit (INDEPENDENT_AMBULATORY_CARE_PROVIDER_SITE_OTHER): Payer: Commercial Managed Care - HMO | Admitting: Family Medicine

## 2016-01-17 ENCOUNTER — Encounter: Payer: Self-pay | Admitting: Family Medicine

## 2016-01-17 VITALS — BP 129/45 | HR 79 | Temp 98.0°F | Ht 66.0 in | Wt 155.4 lb

## 2016-01-17 DIAGNOSIS — Z992 Dependence on renal dialysis: Secondary | ICD-10-CM

## 2016-01-17 DIAGNOSIS — N186 End stage renal disease: Secondary | ICD-10-CM | POA: Diagnosis not present

## 2016-01-17 DIAGNOSIS — L858 Other specified epidermal thickening: Secondary | ICD-10-CM | POA: Insufficient documentation

## 2016-01-17 DIAGNOSIS — M7062 Trochanteric bursitis, left hip: Secondary | ICD-10-CM

## 2016-01-17 MED ORDER — NAPROXEN 500 MG PO TABS: 500.0000 mg | ORAL_TABLET | Freq: Two times a day (BID) | ORAL | Status: DC

## 2016-01-17 NOTE — Patient Instructions (Signed)
Hip Bursitis Bursitis is a swelling and soreness (inflammation) of a fluid-filled sac (bursa). This sac overlies and protects the joints.  CAUSES   Injury.  Overuse of the muscles surrounding the joint.  Arthritis.  Gout.  Infection.  Cold weather.  Inadequate warm-up and conditioning prior to activities. The cause may not be known.  SYMPTOMS   Mild to severe irritation.  Tenderness and swelling over the outside of the hip.  Pain with motion of the hip.  If the bursa becomes infected, a fever may be present. Redness, tenderness, and warmth will develop over the hip. Symptoms usually lessen in 3 to 4 weeks with treatment, but can come back. TREATMENT If conservative treatment does not work, your caregiver may advise draining the bursa and injecting cortisone into the area. This may speed up the healing process. This may also be used as an initial treatment of choice. HOME CARE INSTRUCTIONS   Apply ice to the affected area for 15-20 minutes every 3 to 4 hours while awake for the first 2 days. Put the ice in a plastic bag and place a towel between the bag of ice and your skin.  Rest the painful joint as much as possible, but continue to put the joint through a normal range of motion at least 4 times per day. When the pain lessens, begin normal, slow movements and usual activities to help prevent stiffness of the hip.  Only take over-the-counter or prescription medicines for pain, discomfort, or fever as directed by your caregiver.  Elevate your painful hip to reduce swelling. Use pillows for propping and cushioning your legs and hips.  Gentle massage may provide comfort and decrease swelling. SEEK IMMEDIATE MEDICAL CARE IF:   Your pain increases even during treatment, or you are not improving.  You have a fever.  You have heat and inflammation over the involved bursa.  You have any other questions or concerns. MAKE SURE YOU:   Understand these instructions.  Will  watch your condition.  Will get help right away if you are not doing well or get worse.   This information is not intended to replace advice given to you by your health care provider. Make sure you discuss any questions you have with your health care provider.   Document Released: 01/30/2002 Document Revised: 11/02/2011 Document Reviewed: 03/12/2015 Elsevier Interactive Patient Education [employer]2016 Elsevier Inc.

## 2016-01-18 DIAGNOSIS — E8770 Fluid overload, unspecified: Secondary | ICD-10-CM | POA: Diagnosis not present

## 2016-01-18 DIAGNOSIS — N186 End stage renal disease: Secondary | ICD-10-CM | POA: Diagnosis not present

## 2016-01-21 DIAGNOSIS — E8770 Fluid overload, unspecified: Secondary | ICD-10-CM | POA: Diagnosis not present

## 2016-01-21 DIAGNOSIS — N186 End stage renal disease: Secondary | ICD-10-CM | POA: Diagnosis not present

## 2016-01-21 NOTE — Assessment & Plan Note (Signed)
Lateral hip pain and tenderness over greater trochanter x2 months - RICE - trial of nsaids (discussed that nsaids unlikely to worsen kidney function when she is already on dialysis) - discussed csi but pt would like to hold off and see if it gets better without this - f/u in 2 weeks with PCP

## 2016-01-21 NOTE — Progress Notes (Signed)
   Subjective:   Claudia Rich is a 82 y.o. female with a history of ESRD, OA, CAD, lung disease here for hip pain and skin lesion  EXTREMITY PAIN  Location: lateral left hip/thigh  Pain started: 2 months ago Pain is: sharp, radiates down to lateral knee sometimes Severity: moderate Medications tried: tylenol helps a little Recent trauma: no Similar pain previously: no  Symptoms Redness:no Swelling:no Fever: no Weakness: no Weight loss: no Rash: no   Review of Symptoms - see HPI PMH - Smoking status noted.    Objective:  BP 129/45 mmHg  Pulse 79  Temp(Src) 98 F (36.7 C) (Oral)  Ht 5\' 6"  (1.676 m)  Wt 155 lb 6.4 oz (70.489 kg)  BMI 25.09 kg/m2  Gen:  82 y.o. female in NAD HEENT: NCAT, MMM, EOMI, PERRL, anicteric sclerae CV: RRR, no MRG, no JVD Resp: Non-labored, CTAB, no wheezes noted Abd: Soft, NTND, BS present, no guarding or organomegaly Skin: 5mm skin tag on posterior right arm with hard keratinic growth extending out ~1.5cm MSK: very tender over left greater trochanter, otherwise hip normal with normal ROM and strength Neuro: Alert and oriented, speech normal    Assessment & Plan:     Claudia Rich is a 82 y.o. female here for hip pain and skin lesion  Trochanteric bursitis of left hip Lateral hip pain and tenderness over greater trochanter x2 months - RICE - trial of nsaids (discussed that nsaids unlikely to worsen kidney function when she is already on dialysis) - discussed csi but pt would like to hold off and see if it gets better without this - f/u in 2 weeks with PCP  Cutaneous horn Inflamed from getting snagged on clothes, painful - removed today - keep covered with bacitracin and bandaid until healed - f/u for any increased swelling, warmth, redness, discharge at biopsy site      Beverely LowElena Malli Falotico, MD, MPH [facility] PGY-3 01/21/2016 1:36 PM

## 2016-01-21 NOTE — Assessment & Plan Note (Addendum)
Inflamed from getting snagged on clothes, painful - removed today - keep covered with bacitracin and bandaid until healed - f/u for any increased swelling, warmth, redness, discharge at biopsy site

## 2016-01-22 ENCOUNTER — Telehealth: Payer: Self-pay | Admitting: *Deleted

## 2016-01-22 DIAGNOSIS — N186 End stage renal disease: Secondary | ICD-10-CM | POA: Diagnosis not present

## 2016-01-22 DIAGNOSIS — Z992 Dependence on renal dialysis: Secondary | ICD-10-CM | POA: Diagnosis not present

## 2016-01-22 DIAGNOSIS — E1122 Type 2 diabetes mellitus with diabetic chronic kidney disease: Secondary | ICD-10-CM | POA: Diagnosis not present

## 2016-01-22 DIAGNOSIS — E8770 Fluid overload, unspecified: Secondary | ICD-10-CM | POA: Diagnosis not present

## 2016-01-22 NOTE — Telephone Encounter (Signed)
I didn't take her off amlodipine and have never met the niece, can you take care of this? All I did was put her nsaids for what I think is trochanteric bursitis.

## 2016-01-22 NOTE — Telephone Encounter (Signed)
Pt niece/ medical poa is calling to see if you took her off of her amlodipine? She wants to know why didn't anybody inform her of this. She wants to speak to a dr asap. Please advise. Deseree Bruna PotterBlount, CMA

## 2016-01-23 DIAGNOSIS — N186 End stage renal disease: Secondary | ICD-10-CM | POA: Diagnosis not present

## 2016-01-23 MED ORDER — AMLODIPINE BESYLATE 2.5 MG PO TABS: 2.5000 mg | ORAL_TABLET | Freq: Every evening | ORAL | Status: DC

## 2016-01-23 NOTE — Telephone Encounter (Signed)
Pharmacy told patient's niece Amlodipine was discontinued. Amlodipine is still an active medication with no record of it being discontinues. Refill sent to pharmacy as requested.

## 2016-01-25 DIAGNOSIS — N186 End stage renal disease: Secondary | ICD-10-CM | POA: Diagnosis not present

## 2016-01-28 DIAGNOSIS — N186 End stage renal disease: Secondary | ICD-10-CM | POA: Diagnosis not present

## 2016-01-29 ENCOUNTER — Telehealth: Payer: Self-pay | Admitting: Family Medicine

## 2016-01-29 ENCOUNTER — Other Ambulatory Visit: Payer: Self-pay | Admitting: Family Medicine

## 2016-01-29 MED ORDER — AMLODIPINE BESYLATE 10 MG PO TABS: 10.0000 mg | ORAL_TABLET | Freq: Every evening | ORAL | Status: DC

## 2016-01-29 NOTE — Telephone Encounter (Signed)
[name] informed. Sunday SpillersSharon T Ayham Word, CMA

## 2016-01-29 NOTE — Telephone Encounter (Signed)
Done

## 2016-01-29 NOTE — Telephone Encounter (Signed)
Claudia Rich is calling because Denajah's Amlodipine was called in for 2.05 MG instead of the 10 mg. Can we get the correct one called in. jw

## 2016-01-30 DIAGNOSIS — N186 End stage renal disease: Secondary | ICD-10-CM | POA: Diagnosis not present

## 2016-02-01 DIAGNOSIS — N186 End stage renal disease: Secondary | ICD-10-CM | POA: Diagnosis not present

## 2016-02-04 ENCOUNTER — Telehealth: Payer: Self-pay | Admitting: Family Medicine

## 2016-02-04 DIAGNOSIS — N186 End stage renal disease: Secondary | ICD-10-CM | POA: Diagnosis not present

## 2016-02-04 NOTE — Telephone Encounter (Signed)
Contacted pt niece and gave her the below information, she stated that her aunt has an appointment on the 19th and she would get her to pick it up at her visit. Claudia Rich, April D, [place]

## 2016-02-04 NOTE — Telephone Encounter (Signed)
Please call niece Claudia StackOlivia Rich at [phone] and let her know that the paperwork requested cannot be faxed due to the medical information included. A copy will be left at the front desk for her to pick up at her convenience.

## 2016-02-05 ENCOUNTER — Other Ambulatory Visit: Payer: Self-pay | Admitting: Family Medicine

## 2016-02-06 DIAGNOSIS — N186 End stage renal disease: Secondary | ICD-10-CM | POA: Diagnosis not present

## 2016-02-08 DIAGNOSIS — N186 End stage renal disease: Secondary | ICD-10-CM | POA: Diagnosis not present

## 2016-02-10 ENCOUNTER — Ambulatory Visit
Admission: RE | Admit: 2016-02-10 | Discharge: 2016-02-10 | Disposition: A | Discharge status: Home / Self Care | Payer: Commercial Managed Care - HMO | Source: Ambulatory Visit | Attending: Family Medicine | Admitting: Family Medicine

## 2016-02-10 ENCOUNTER — Encounter: Payer: Self-pay | Admitting: Family Medicine

## 2016-02-10 ENCOUNTER — Ambulatory Visit (INDEPENDENT_AMBULATORY_CARE_PROVIDER_SITE_OTHER): Payer: Commercial Managed Care - HMO | Admitting: Family Medicine

## 2016-02-10 VITALS — BP 141/53 | HR 83 | Temp 98.2°F | Ht 66.0 in | Wt 159.4 lb

## 2016-02-10 DIAGNOSIS — G8929 Other chronic pain: Secondary | ICD-10-CM | POA: Diagnosis not present

## 2016-02-10 DIAGNOSIS — M7062 Trochanteric bursitis, left hip: Secondary | ICD-10-CM | POA: Diagnosis not present

## 2016-02-10 DIAGNOSIS — M25552 Pain in left hip: Principal | ICD-10-CM

## 2016-02-10 MED ORDER — MELOXICAM 7.5 MG PO TABS: 7.5000 mg | ORAL_TABLET | Freq: Every day | ORAL | Status: DC

## 2016-02-10 NOTE — Patient Instructions (Signed)
Thank you for coming in to clinic today.  1. Her Left hip pain seems most consistent with chronic arthritis pain, given she has other areas of arthritis including low back. This can be worsened with inactivity especially prolonged sitting such as hemodialysis. 2. Ordered x-rays pelvis including left [facility] - this is walk in based, no appointment needed, business hours M-F, I will call you with results once we receive these and discuss plan 3. Finished Naproxen, start taking Meloxicam (Mobic) 7.5mg  daily for 2 weeks if helping may continue for up to 4 weeks. - Continue Tylenol as needed, this is probably best arthritis medicine  Recommend heating pad on hip and upper back / shoulders / neck for muscle spasms. Do not think she would tolerate muscle relaxant medication well.  If concerns about dehydration can discuss with Hemodialysis / Kidney doctors, may take less fluid with dialysis. No changes today.  She does not endorse any clinical symptoms of urinary tract infection. It sounds like her confusion is more chronic related to some dementia, would discuss this further with her primary doctor.  Please schedule a follow-up appointment with Dr Josie Dixonumely in 3-4 weeks to discuss X-ray results and consider Left hip injection, also future Referral to Physical Therapy  If you have any other questions or concerns, please feel free to call the clinic to contact me. You may also schedule an earlier appointment if necessary.  However, if your symptoms get significantly worse, please go to the Emergency Department to seek immediate medical attention.  Saralyn PilarAlexander Elah Avellino, DO [facility]

## 2016-02-10 NOTE — Progress Notes (Signed)
Subjective:    Patient ID: Claudia Rich, female    DOB: 1927/02/09, 82 y.o.   MRN: [id number]  Claudia Rich is a 82 y.o. female presenting on 02/10/2016 for Hip Pain   History per patient, family friend in office, and by niece Claudia Rich over the phone during office visit.  HPI  LEFT HIP PAIN, CHRONIC - Known prior history of OA multiple sites including low back. No prior surgery or joint replacements. - Reports onset Left hip pain for past 3 months, gradual worsening, without any inciting injury, trauma or fall. She was seen last 12/2014 at [facility], given rx Naproxen  BID finished 2 week without any significant improvement. - Today presents for follow-up given persistent pain, patient has difficulty describing pain but seems mostly Left hip with radiation down left lateral leg to her knee, unable to localize, seems constant worse with prolonged sitting (especially dialysis) and ambulation, does not endorse lower leg pain or symptoms of claudication. - Takes Tylenol BID PRN with relief - PMH ESRD (oliguric) - Familiy admits some chronic confusion (>6 mo -1 yr gradual worsening), with PMH Dementia, s/p CVA - Admits muscle soreness/stiffness in neck and upper back muscles, improved today - Denies weakness, numbness, tingling, saddle anesthesia, bowel/bladder incontinence, dysuria, urinary frequency  Social History  Substance Use Topics  . Smoking status: Never Smoker   . Smokeless tobacco: Never Used  . Alcohol Use: No    Review of Systems Per HPI unless specifically indicated above     Objective:    BP 141/53 mmHg  Pulse 83  Temp(Src) 98.2 F (36.8 C) (Oral)  Ht  (1.676 m)  Wt 159 lb 6.4 oz (72.303 kg)  BMI 25.74 kg/m2  Wt Readings from Last 3 Encounters:  02/10/16 159 lb 6.4 oz (72.303 kg)  01/17/16 155 lb 6.4 oz (70.489 kg)  06/26/15 152 lb (68.947 kg)    Physical Exam  Constitutional: She appears well-developed and well-nourished. No distress.  Well-appearing,  comfortable, cooperative  HENT:  Head: Normocephalic and atraumatic.  Mouth/Throat: Oropharynx is clear and moist.  Neck: Normal range of motion.  C-spine paraspinal muscles non-tender and without spasm or hypertonicity. Non-tender over spinous processes. Full active ROM flex/ext, rotation. Bilateral trapezius muscles with mild spasm but non-tender.  Cardiovascular: Normal rate and intact distal pulses.   Pulmonary/Chest: Effort normal.  Abdominal: Soft. She exhibits no distension. There is no tenderness.  Musculoskeletal: She exhibits no edema.  Left Hip Inspection: normal appearance, symmetrical to Right Palpation: Mild discomfort over Left greater trochanter however difficult to localize, patient endorses radiating pain down lateral leg to L-knee. ROM: Slightly limited internal L-hip rotation, normal L-hip external rotation Special Testing: FABER/FADIR tests without significant pain Strength: 5/5 intact hip flex/ext, knee flex/ext Neurovascular: distally intact  Skin: Skin is warm and dry. She is not diaphoretic.  Nursing note and vitals reviewed.  Results for orders placed or performed in visit on 06/26/15  POCT glycosylated hemoglobin (Hb A1C)  Result Value Ref Range   Hemoglobin A1C 7.0       Assessment & Plan:   Problem List Items Addressed This Visit    Trochanteric bursitis of left hip    Likely contributing etiology to L-hip pain, now chronic >3 months. Poorly localized on exam though. X-rays without significant OA, joint space preserved, increase suspicion for bursitis / muscle weakness. Switch NSAID naproxen to mobic for brief trial Follow-up if not improved, consider L-trochanteric bursa steroid injection, future PT  Relevant Medications   meloxicam (MOBIC) 7.5 MG tablet   Chronic left hip pain - Primary    Chroinc L-hip pain with radiation down leg x 3 months, without improvement, no injury. Suspected likely component of OA in 82 yr old female with possible  acute on chronic trochanteric bursitis. Exam is difficult to localize and hip ROM is mostly intact, less suggestive of severe acetabular DJD.  Pla: 1. Ordered Left hip / pelvis x-rays (weightbearing) reviewed results without significant OA, left hip joint space is mostly preserved, no fractures in pelvis or femur, osteopenia is evident, moderated calcified femoral artery atherosclerosis - Reviewed x-ray results with family by phone 2. Defer corticosteroid injection for possible trochanteric bursitis today given discussion with niece Claudia ScaleOlivia, wish to proceed with x-ray first, change med therapy and then if not improving consider injection next visit 3. Discontinue Naproxen. Trial Mobic 7.5mg  daily x 2-4 weeks (given ESRD would be acceptable to do trial of NSAID, counseled on other cardiac risks) 4. Stay active with walking, may use ice / heat PRN, continue Tylenol PRN 5. Follow-up 3 weeks, consider L-trochanteric bursa steroid injection if not improved, may benefit from future referral to PT for glut/LE muscle strengthening if bursitis, given less like hip OA      Relevant Medications   meloxicam (MOBIC) 7.5 MG tablet   Other Relevant Orders   DG HIP UNILAT WITH PELVIS 2-3 VIEWS LEFT (Completed)      Meds ordered this encounter  Medications  . meloxicam (MOBIC) 7.5 MG tablet    Sig: Take 1 tablet (7.5 mg total) by mouth daily. For up to 2-4 weeks if helping.    Dispense:  30 tablet    Refill:  0      Follow up plan: Return in about 3 weeks (around 03/02/2016) for left hip pain, x-ray results.  Claudia PilarAlexander Lew Prout, DO [facility], PGY-3

## 2016-02-11 ENCOUNTER — Other Ambulatory Visit: Payer: Commercial Managed Care - HMO

## 2016-02-11 DIAGNOSIS — M25552 Pain in left hip: Principal | ICD-10-CM

## 2016-02-11 DIAGNOSIS — G8929 Other chronic pain: Secondary | ICD-10-CM | POA: Insufficient documentation

## 2016-02-11 DIAGNOSIS — N186 End stage renal disease: Secondary | ICD-10-CM | POA: Diagnosis not present

## 2016-02-11 NOTE — Assessment & Plan Note (Signed)
Chroinc L-hip pain with radiation down leg x 3 months, without improvement, no injury. Suspected likely component of OA in 82 yr old female with possible acute on chronic trochanteric bursitis. Exam is difficult to localize and hip ROM is mostly intact, less suggestive of severe acetabular DJD.  Pla: 1. Ordered Left hip / pelvis x-rays (weightbearing) reviewed results without significant OA, left hip joint space is mostly preserved, no fractures in pelvis or femur, osteopenia is evident, moderated calcified femoral artery atherosclerosis - Reviewed x-ray results with family by phone 2. Defer corticosteroid injection for possible trochanteric bursitis today given discussion with niece Claudia Rich, wish to proceed with x-ray first, change med therapy and then if not improving consider injection next visit 3. Discontinue Naproxen. Trial Mobic 7.5mg  daily x 2-4 weeks (given ESRD would be acceptable to do trial of NSAID, counseled on other cardiac risks) 4. Stay active with walking, may use ice / heat PRN, continue Tylenol PRN 5. Follow-up 3 weeks, consider L-trochanteric bursa steroid injection if not improved, may benefit from future referral to PT for glut/LE muscle strengthening if bursitis, given less like hip OA

## 2016-02-11 NOTE — Assessment & Plan Note (Signed)
Likely contributing etiology to L-hip pain, now chronic >3 months. Poorly localized on exam though. X-rays without significant OA, joint space preserved, increase suspicion for bursitis / muscle weakness. Switch NSAID naproxen to mobic for brief trial Follow-up if not improved, consider L-trochanteric bursa steroid injection, future PT

## 2016-02-13 DIAGNOSIS — N186 End stage renal disease: Secondary | ICD-10-CM | POA: Diagnosis not present

## 2016-02-15 DIAGNOSIS — N186 End stage renal disease: Secondary | ICD-10-CM | POA: Diagnosis not present

## 2016-02-18 DIAGNOSIS — N186 End stage renal disease: Secondary | ICD-10-CM | POA: Diagnosis not present

## 2016-02-19 DIAGNOSIS — E1351 Other specified diabetes mellitus with diabetic peripheral angiopathy without gangrene: Secondary | ICD-10-CM | POA: Diagnosis not present

## 2016-02-19 DIAGNOSIS — L602 Onychogryphosis: Secondary | ICD-10-CM | POA: Diagnosis not present

## 2016-02-19 DIAGNOSIS — I70293 Other atherosclerosis of native arteries of extremities, bilateral legs: Secondary | ICD-10-CM | POA: Diagnosis not present

## 2016-02-19 DIAGNOSIS — L84 Corns and callosities: Secondary | ICD-10-CM | POA: Diagnosis not present

## 2016-02-20 DIAGNOSIS — N186 End stage renal disease: Secondary | ICD-10-CM | POA: Diagnosis not present

## 2016-02-21 DIAGNOSIS — E1122 Type 2 diabetes mellitus with diabetic chronic kidney disease: Secondary | ICD-10-CM | POA: Diagnosis not present

## 2016-02-21 DIAGNOSIS — N186 End stage renal disease: Secondary | ICD-10-CM | POA: Diagnosis not present

## 2016-02-21 DIAGNOSIS — Z992 Dependence on renal dialysis: Secondary | ICD-10-CM | POA: Diagnosis not present

## 2016-02-22 DIAGNOSIS — N186 End stage renal disease: Secondary | ICD-10-CM | POA: Diagnosis not present

## 2016-02-25 DIAGNOSIS — N186 End stage renal disease: Secondary | ICD-10-CM | POA: Diagnosis not present

## 2016-02-27 DIAGNOSIS — N186 End stage renal disease: Secondary | ICD-10-CM | POA: Diagnosis not present

## 2016-02-29 DIAGNOSIS — N186 End stage renal disease: Secondary | ICD-10-CM | POA: Diagnosis not present

## 2016-03-03 DIAGNOSIS — N186 End stage renal disease: Secondary | ICD-10-CM | POA: Diagnosis not present

## 2016-03-04 ENCOUNTER — Other Ambulatory Visit: Payer: Self-pay | Admitting: Family Medicine

## 2016-03-05 DIAGNOSIS — N186 End stage renal disease: Secondary | ICD-10-CM | POA: Diagnosis not present

## 2016-03-07 DIAGNOSIS — N186 End stage renal disease: Secondary | ICD-10-CM | POA: Diagnosis not present

## 2016-03-10 DIAGNOSIS — N186 End stage renal disease: Secondary | ICD-10-CM | POA: Diagnosis not present

## 2016-03-12 DIAGNOSIS — N186 End stage renal disease: Secondary | ICD-10-CM | POA: Diagnosis not present

## 2016-03-12 DIAGNOSIS — M7060 Trochanteric bursitis, unspecified hip: Secondary | ICD-10-CM | POA: Diagnosis not present

## 2016-03-13 DIAGNOSIS — M7060 Trochanteric bursitis, unspecified hip: Secondary | ICD-10-CM | POA: Diagnosis not present

## 2016-03-14 DIAGNOSIS — M7060 Trochanteric bursitis, unspecified hip: Secondary | ICD-10-CM | POA: Diagnosis not present

## 2016-03-14 DIAGNOSIS — N186 End stage renal disease: Secondary | ICD-10-CM | POA: Diagnosis not present

## 2016-03-15 DIAGNOSIS — M7060 Trochanteric bursitis, unspecified hip: Secondary | ICD-10-CM | POA: Diagnosis not present

## 2016-03-16 DIAGNOSIS — M7060 Trochanteric bursitis, unspecified hip: Secondary | ICD-10-CM | POA: Diagnosis not present

## 2016-03-17 DIAGNOSIS — N186 End stage renal disease: Secondary | ICD-10-CM | POA: Diagnosis not present

## 2016-03-18 DIAGNOSIS — M7060 Trochanteric bursitis, unspecified hip: Secondary | ICD-10-CM | POA: Diagnosis not present

## 2016-03-19 DIAGNOSIS — N186 End stage renal disease: Secondary | ICD-10-CM | POA: Diagnosis not present

## 2016-03-19 DIAGNOSIS — M7060 Trochanteric bursitis, unspecified hip: Secondary | ICD-10-CM | POA: Diagnosis not present

## 2016-03-20 DIAGNOSIS — M7060 Trochanteric bursitis, unspecified hip: Secondary | ICD-10-CM | POA: Diagnosis not present

## 2016-03-21 DIAGNOSIS — M7060 Trochanteric bursitis, unspecified hip: Secondary | ICD-10-CM | POA: Diagnosis not present

## 2016-03-21 DIAGNOSIS — N186 End stage renal disease: Secondary | ICD-10-CM | POA: Diagnosis not present

## 2016-03-22 DIAGNOSIS — M7060 Trochanteric bursitis, unspecified hip: Secondary | ICD-10-CM | POA: Diagnosis not present

## 2016-03-23 DIAGNOSIS — M7060 Trochanteric bursitis, unspecified hip: Secondary | ICD-10-CM | POA: Diagnosis not present

## 2016-03-23 DIAGNOSIS — E1122 Type 2 diabetes mellitus with diabetic chronic kidney disease: Secondary | ICD-10-CM | POA: Diagnosis not present

## 2016-03-23 DIAGNOSIS — N186 End stage renal disease: Secondary | ICD-10-CM | POA: Diagnosis not present

## 2016-03-23 DIAGNOSIS — Z992 Dependence on renal dialysis: Secondary | ICD-10-CM | POA: Diagnosis not present

## 2016-03-24 DIAGNOSIS — M7060 Trochanteric bursitis, unspecified hip: Secondary | ICD-10-CM | POA: Diagnosis not present

## 2016-03-24 DIAGNOSIS — N186 End stage renal disease: Secondary | ICD-10-CM | POA: Diagnosis not present

## 2016-03-25 ENCOUNTER — Other Ambulatory Visit: Payer: Self-pay | Admitting: Family Medicine

## 2016-03-25 DIAGNOSIS — M7060 Trochanteric bursitis, unspecified hip: Secondary | ICD-10-CM | POA: Diagnosis not present

## 2016-03-26 DIAGNOSIS — N186 End stage renal disease: Secondary | ICD-10-CM | POA: Diagnosis not present

## 2016-03-26 DIAGNOSIS — M7060 Trochanteric bursitis, unspecified hip: Secondary | ICD-10-CM | POA: Diagnosis not present

## 2016-03-27 DIAGNOSIS — M7060 Trochanteric bursitis, unspecified hip: Secondary | ICD-10-CM | POA: Diagnosis not present

## 2016-03-28 DIAGNOSIS — M7060 Trochanteric bursitis, unspecified hip: Secondary | ICD-10-CM | POA: Diagnosis not present

## 2016-03-28 DIAGNOSIS — N186 End stage renal disease: Secondary | ICD-10-CM | POA: Diagnosis not present

## 2016-03-29 DIAGNOSIS — M7060 Trochanteric bursitis, unspecified hip: Secondary | ICD-10-CM | POA: Diagnosis not present

## 2016-03-30 DIAGNOSIS — M7060 Trochanteric bursitis, unspecified hip: Secondary | ICD-10-CM | POA: Diagnosis not present

## 2016-03-31 DIAGNOSIS — N186 End stage renal disease: Secondary | ICD-10-CM | POA: Diagnosis not present

## 2016-03-31 DIAGNOSIS — M7060 Trochanteric bursitis, unspecified hip: Secondary | ICD-10-CM | POA: Diagnosis not present

## 2016-04-01 DIAGNOSIS — M7060 Trochanteric bursitis, unspecified hip: Secondary | ICD-10-CM | POA: Diagnosis not present

## 2016-04-02 DIAGNOSIS — N186 End stage renal disease: Secondary | ICD-10-CM | POA: Diagnosis not present

## 2016-04-02 DIAGNOSIS — M7060 Trochanteric bursitis, unspecified hip: Secondary | ICD-10-CM | POA: Diagnosis not present

## 2016-04-03 DIAGNOSIS — M7060 Trochanteric bursitis, unspecified hip: Secondary | ICD-10-CM | POA: Diagnosis not present

## 2016-04-04 DIAGNOSIS — M7060 Trochanteric bursitis, unspecified hip: Secondary | ICD-10-CM | POA: Diagnosis not present

## 2016-04-04 DIAGNOSIS — N186 End stage renal disease: Secondary | ICD-10-CM | POA: Diagnosis not present

## 2016-04-05 DIAGNOSIS — M7060 Trochanteric bursitis, unspecified hip: Secondary | ICD-10-CM | POA: Diagnosis not present

## 2016-04-06 DIAGNOSIS — M7060 Trochanteric bursitis, unspecified hip: Secondary | ICD-10-CM | POA: Diagnosis not present

## 2016-04-07 DIAGNOSIS — N186 End stage renal disease: Secondary | ICD-10-CM | POA: Diagnosis not present

## 2016-04-09 DIAGNOSIS — N186 End stage renal disease: Secondary | ICD-10-CM | POA: Diagnosis not present

## 2016-04-10 DIAGNOSIS — M7060 Trochanteric bursitis, unspecified hip: Secondary | ICD-10-CM | POA: Diagnosis not present

## 2016-04-11 DIAGNOSIS — N186 End stage renal disease: Secondary | ICD-10-CM | POA: Diagnosis not present

## 2016-04-14 DIAGNOSIS — N186 End stage renal disease: Secondary | ICD-10-CM | POA: Diagnosis not present

## 2016-04-16 DIAGNOSIS — N186 End stage renal disease: Secondary | ICD-10-CM | POA: Diagnosis not present

## 2016-04-17 ENCOUNTER — Encounter: Payer: Self-pay | Admitting: Family Medicine

## 2016-04-17 ENCOUNTER — Ambulatory Visit (INDEPENDENT_AMBULATORY_CARE_PROVIDER_SITE_OTHER): Payer: Commercial Managed Care - HMO | Admitting: Family Medicine

## 2016-04-17 VITALS — BP 143/65 | HR 87 | Temp 98.1°F | Ht 66.0 in | Wt 156.4 lb

## 2016-04-17 DIAGNOSIS — Z23 Encounter for immunization: Secondary | ICD-10-CM | POA: Diagnosis not present

## 2016-04-17 DIAGNOSIS — M7062 Trochanteric bursitis, left hip: Secondary | ICD-10-CM

## 2016-04-17 DIAGNOSIS — Z992 Dependence on renal dialysis: Secondary | ICD-10-CM

## 2016-04-17 DIAGNOSIS — E1122 Type 2 diabetes mellitus with diabetic chronic kidney disease: Secondary | ICD-10-CM

## 2016-04-17 DIAGNOSIS — N186 End stage renal disease: Secondary | ICD-10-CM | POA: Diagnosis not present

## 2016-04-17 LAB — POCT GLYCOSYLATED HEMOGLOBIN (HGB A1C): HEMOGLOBIN A1C: 6

## 2016-04-17 MED ORDER — METHYLPREDNISOLONE ACETATE 80 MG/ML IJ SUSP
80.0000 mg | Freq: Once | INTRAMUSCULAR | Status: AC
  Administered 2016-04-17: 80 mg via INTRAMUSCULAR

## 2016-04-17 MED ORDER — ZINC OXIDE 20 % EX OINT: 1.0000 "application " | TOPICAL_OINTMENT | Freq: Two times a day (BID) | CUTANEOUS | 3 refills | Status: DC

## 2016-04-17 NOTE — Assessment & Plan Note (Signed)
-  Unresolved -Corticosteroid injection of left hip given. Please see procedure note below. -Continue over-the-counter treatment. -Follow-up if no improvement. Consider referral to sports medicine clinic or orthopedics if no improvement.  PROCEDURE NOTE: -Written consent obtained -Area sterilized with alcohol swabs X2. -Cold Spray applied to help with anesthesia -Area of maximum tenderness over greater trochanter down and injected. 4-1 corticosteroid. -No complications noted

## 2016-04-17 NOTE — Progress Notes (Signed)
Subjective:     Patient ID: Claudia Rich Rich, female   DOB: 1927-07-11, 82 y.o.   MRN: [id number]  HPI Mrs. Claudia Rich is a 82 year old female presenting today for left hip pain. -Last office visit in June 2017 for same. Previously described is worse with prolonged sitting and ambulation. Suspected to be secondary to osteoarthritis and trochanteric bursitis. -Continues to note left hip pain. States is mostly located over her lateral hip with occasional extension down to lateral left knee. -States it has been present for several months. -States she takes Advil but this is not helping -Amenable to left hip injection -Nonsmoker   Review of Systems Per HPI. Other systems negative.    Objective:   Physical Exam  Constitutional: She appears well-developed and well-nourished.  Musculoskeletal: She exhibits no edema.  Tenderness noted over left greater trochanter. No tenderness over right greater trochanter. Decreased range of motion of bilateral hips.  Psychiatric: She has a normal mood and affect. Her behavior is normal.      Assessment and Plan:     Trochanteric bursitis of left hip -Unresolved -Corticosteroid injection of left hip given. Please see procedure note below. -Continue over-the-counter treatment. -Follow-up if no improvement. Consider referral to sports medicine clinic or orthopedics if no improvement.  PROCEDURE NOTE: -Written consent obtained -Area sterilized with alcohol swabs X2. -Cold Spray applied to help with anesthesia -Area of maximum tenderness over greater trochanter down and injected. 4-1 corticosteroid. -No complications noted

## 2016-04-17 NOTE — Patient Instructions (Signed)
Thank you so much for coming to visit today! We injected your trochanteric bursa today. Please let us know if this does not help the pain. If your pain is not improved, we may need to refer you to an Orthopedic physician.  Dr. Caroleen Hammanumley  Hip Bursitis Bursitis is a swelling and soreness (inflammation) of a fluid-filled sac (bursa). This sac overlies and protects the joints.  CAUSES   Injury.  Overuse of the muscles surrounding the joint.  Arthritis.  Gout.  Infection.  Cold weather.  Inadequate warm-up and conditioning prior to activities. The cause may not be known.  SYMPTOMS   Mild to severe irritation.  Tenderness and swelling over the outside of the hip.  Pain with motion of the hip.  If the bursa becomes infected, a fever may be present. Redness, tenderness, and warmth will develop over the hip. Symptoms usually lessen in 3 to 4 weeks with treatment, but can come back. TREATMENT If conservative treatment does not work, your caregiver may advise draining the bursa and injecting cortisone into the area. This may speed up the healing process. This may also be used as an initial treatment of choice. HOME CARE INSTRUCTIONS   Apply ice to the affected area for 15-20 minutes every 3 to 4 hours while awake for the first 2 days. Put the ice in a plastic bag and place a towel between the bag of ice and your skin.  Rest the painful joint as much as possible, but continue to put the joint through a normal range of motion at least 4 times per day. When the pain lessens, begin normal, slow movements and usual activities to help prevent stiffness of the hip.  Only take over-the-counter or prescription medicines for pain, discomfort, or fever as directed by your caregiver.  Use crutches to limit weight bearing on the hip joint, if advised.  Elevate your painful hip to reduce swelling. Use pillows for propping and cushioning your legs and hips.  Gentle massage may provide comfort and  decrease swelling. SEEK IMMEDIATE MEDICAL CARE IF:   Your pain increases even during treatment, or you are not improving.  You have a fever.  You have heat and inflammation over the involved bursa.  You have any other questions or concerns. MAKE SURE YOU:   Understand these instructions.  Will watch your condition.  Will get help right away if you are not doing well or get worse.   This information is not intended to replace advice given to you by your health care provider. Make sure you discuss any questions you have with your health care provider.   Document Released: 01/30/2002 Document Revised: 11/02/2011 Document Reviewed: 03/12/2015 Elsevier Interactive Patient Education [employer]2016 Elsevier Inc.

## 2016-04-18 DIAGNOSIS — N186 End stage renal disease: Secondary | ICD-10-CM | POA: Diagnosis not present

## 2016-04-21 DIAGNOSIS — N186 End stage renal disease: Secondary | ICD-10-CM | POA: Diagnosis not present

## 2016-04-22 ENCOUNTER — Other Ambulatory Visit: Payer: Self-pay | Admitting: Family Medicine

## 2016-04-22 DIAGNOSIS — M7060 Trochanteric bursitis, unspecified hip: Secondary | ICD-10-CM | POA: Diagnosis not present

## 2016-04-23 DIAGNOSIS — E1122 Type 2 diabetes mellitus with diabetic chronic kidney disease: Secondary | ICD-10-CM | POA: Diagnosis not present

## 2016-04-23 DIAGNOSIS — Z992 Dependence on renal dialysis: Secondary | ICD-10-CM | POA: Diagnosis not present

## 2016-04-23 DIAGNOSIS — M7060 Trochanteric bursitis, unspecified hip: Secondary | ICD-10-CM | POA: Diagnosis not present

## 2016-04-23 DIAGNOSIS — N186 End stage renal disease: Secondary | ICD-10-CM | POA: Diagnosis not present

## 2016-04-24 DIAGNOSIS — Z992 Dependence on renal dialysis: Secondary | ICD-10-CM | POA: Diagnosis not present

## 2016-04-24 DIAGNOSIS — T82858D Stenosis of vascular prosthetic devices, implants and grafts, subsequent encounter: Secondary | ICD-10-CM | POA: Diagnosis not present

## 2016-04-24 DIAGNOSIS — N186 End stage renal disease: Secondary | ICD-10-CM | POA: Diagnosis not present

## 2016-04-24 DIAGNOSIS — I871 Compression of vein: Secondary | ICD-10-CM | POA: Diagnosis not present

## 2016-04-25 ENCOUNTER — Other Ambulatory Visit: Payer: Self-pay | Admitting: Family Medicine

## 2016-04-25 DIAGNOSIS — N186 End stage renal disease: Secondary | ICD-10-CM | POA: Diagnosis not present

## 2016-04-25 DIAGNOSIS — M7060 Trochanteric bursitis, unspecified hip: Secondary | ICD-10-CM | POA: Diagnosis not present

## 2016-04-26 DIAGNOSIS — M7060 Trochanteric bursitis, unspecified hip: Secondary | ICD-10-CM | POA: Diagnosis not present

## 2016-04-28 ENCOUNTER — Other Ambulatory Visit: Payer: Self-pay | Admitting: Family Medicine

## 2016-04-28 DIAGNOSIS — M7060 Trochanteric bursitis, unspecified hip: Secondary | ICD-10-CM | POA: Diagnosis not present

## 2016-04-28 DIAGNOSIS — N186 End stage renal disease: Secondary | ICD-10-CM | POA: Diagnosis not present

## 2016-04-29 DIAGNOSIS — M7060 Trochanteric bursitis, unspecified hip: Secondary | ICD-10-CM | POA: Diagnosis not present

## 2016-04-30 DIAGNOSIS — N186 End stage renal disease: Secondary | ICD-10-CM | POA: Diagnosis not present

## 2016-04-30 DIAGNOSIS — M7060 Trochanteric bursitis, unspecified hip: Secondary | ICD-10-CM | POA: Diagnosis not present

## 2016-05-01 DIAGNOSIS — M7060 Trochanteric bursitis, unspecified hip: Secondary | ICD-10-CM | POA: Diagnosis not present

## 2016-05-02 DIAGNOSIS — M7060 Trochanteric bursitis, unspecified hip: Secondary | ICD-10-CM | POA: Diagnosis not present

## 2016-05-02 DIAGNOSIS — N186 End stage renal disease: Secondary | ICD-10-CM | POA: Diagnosis not present

## 2016-05-03 DIAGNOSIS — M7060 Trochanteric bursitis, unspecified hip: Secondary | ICD-10-CM | POA: Diagnosis not present

## 2016-05-04 ENCOUNTER — Telehealth: Payer: Self-pay | Admitting: *Deleted

## 2016-05-04 DIAGNOSIS — M7060 Trochanteric bursitis, unspecified hip: Secondary | ICD-10-CM | POA: Diagnosis not present

## 2016-05-04 NOTE — Telephone Encounter (Signed)
[facility]le [facility] in reference to some papers that were faxed that just need to be signed.  She stated that [name] plan of care ran out on September 1st.  Checked and fax stating second attempt was in providers box.  Paged PCP to see if she would be able to take care of this or if I should get someone else to sign it. Will await a response from PCP.  Lamonte SakaiZimmerman Rumple, April D, [place]

## 2016-05-05 DIAGNOSIS — N186 End stage renal disease: Secondary | ICD-10-CM | POA: Diagnosis not present

## 2016-05-05 DIAGNOSIS — M7060 Trochanteric bursitis, unspecified hip: Secondary | ICD-10-CM | POA: Diagnosis not present

## 2016-05-05 NOTE — Telephone Encounter (Signed)
Signed and faxed

## 2016-05-06 DIAGNOSIS — M7060 Trochanteric bursitis, unspecified hip: Secondary | ICD-10-CM | POA: Diagnosis not present

## 2016-05-07 DIAGNOSIS — M7060 Trochanteric bursitis, unspecified hip: Secondary | ICD-10-CM | POA: Diagnosis not present

## 2016-05-07 DIAGNOSIS — N186 End stage renal disease: Secondary | ICD-10-CM | POA: Diagnosis not present

## 2016-05-08 DIAGNOSIS — M7060 Trochanteric bursitis, unspecified hip: Secondary | ICD-10-CM | POA: Diagnosis not present

## 2016-05-09 DIAGNOSIS — M7060 Trochanteric bursitis, unspecified hip: Secondary | ICD-10-CM | POA: Diagnosis not present

## 2016-05-09 DIAGNOSIS — N186 End stage renal disease: Secondary | ICD-10-CM | POA: Diagnosis not present

## 2016-05-10 DIAGNOSIS — M7060 Trochanteric bursitis, unspecified hip: Secondary | ICD-10-CM | POA: Diagnosis not present

## 2016-05-11 DIAGNOSIS — M7060 Trochanteric bursitis, unspecified hip: Secondary | ICD-10-CM | POA: Diagnosis not present

## 2016-05-12 DIAGNOSIS — M7060 Trochanteric bursitis, unspecified hip: Secondary | ICD-10-CM | POA: Diagnosis not present

## 2016-05-12 DIAGNOSIS — N186 End stage renal disease: Secondary | ICD-10-CM | POA: Diagnosis not present

## 2016-05-13 DIAGNOSIS — M7060 Trochanteric bursitis, unspecified hip: Secondary | ICD-10-CM | POA: Diagnosis not present

## 2016-05-14 DIAGNOSIS — N186 End stage renal disease: Secondary | ICD-10-CM | POA: Diagnosis not present

## 2016-05-14 DIAGNOSIS — M7060 Trochanteric bursitis, unspecified hip: Secondary | ICD-10-CM | POA: Diagnosis not present

## 2016-05-15 DIAGNOSIS — M7060 Trochanteric bursitis, unspecified hip: Secondary | ICD-10-CM | POA: Diagnosis not present

## 2016-05-16 DIAGNOSIS — N186 End stage renal disease: Secondary | ICD-10-CM | POA: Diagnosis not present

## 2016-05-16 DIAGNOSIS — M7060 Trochanteric bursitis, unspecified hip: Secondary | ICD-10-CM | POA: Diagnosis not present

## 2016-05-17 DIAGNOSIS — M7060 Trochanteric bursitis, unspecified hip: Secondary | ICD-10-CM | POA: Diagnosis not present

## 2016-05-18 DIAGNOSIS — M7060 Trochanteric bursitis, unspecified hip: Secondary | ICD-10-CM | POA: Diagnosis not present

## 2016-05-19 DIAGNOSIS — N186 End stage renal disease: Secondary | ICD-10-CM | POA: Diagnosis not present

## 2016-05-20 DIAGNOSIS — L84 Corns and callosities: Secondary | ICD-10-CM | POA: Diagnosis not present

## 2016-05-20 DIAGNOSIS — L602 Onychogryphosis: Secondary | ICD-10-CM | POA: Diagnosis not present

## 2016-05-20 DIAGNOSIS — M7060 Trochanteric bursitis, unspecified hip: Secondary | ICD-10-CM | POA: Diagnosis not present

## 2016-05-20 DIAGNOSIS — E1351 Other specified diabetes mellitus with diabetic peripheral angiopathy without gangrene: Secondary | ICD-10-CM | POA: Diagnosis not present

## 2016-05-21 ENCOUNTER — Other Ambulatory Visit: Payer: Self-pay

## 2016-05-21 DIAGNOSIS — N186 End stage renal disease: Secondary | ICD-10-CM | POA: Diagnosis not present

## 2016-05-21 NOTE — Patient Outreach (Signed)
[facility] Care Management  05/21/2016  Clent Ridgesddie R Shaw [birth year] [id number]     Telephone Screen  Referral Date: 05/19/16 Referral Source: EMMI Prevent Referral Reason: " COPD, DM, HTN, kidney failure"     Outreach attempt # 1 to patient. No answer. RN CM left HIPAA compliant voicemail message along with contact info.      Plan: RN CM will make outreach attempt to patient within a week if no return call from patient.    Antionette Fairyoshanda Teresea Donley, RN,BSN,CCM [facility] Care [facility] Telephonic Care Management Coordinator Direct Phone: [phone] Toll Free: [phone] Fax: [phone]

## 2016-05-22 DIAGNOSIS — M7060 Trochanteric bursitis, unspecified hip: Secondary | ICD-10-CM | POA: Diagnosis not present

## 2016-05-23 DIAGNOSIS — M7060 Trochanteric bursitis, unspecified hip: Secondary | ICD-10-CM | POA: Diagnosis not present

## 2016-05-23 DIAGNOSIS — N186 End stage renal disease: Secondary | ICD-10-CM | POA: Diagnosis not present

## 2016-05-23 DIAGNOSIS — Z992 Dependence on renal dialysis: Secondary | ICD-10-CM | POA: Diagnosis not present

## 2016-05-23 DIAGNOSIS — E1122 Type 2 diabetes mellitus with diabetic chronic kidney disease: Secondary | ICD-10-CM | POA: Diagnosis not present

## 2016-05-24 DIAGNOSIS — M7060 Trochanteric bursitis, unspecified hip: Secondary | ICD-10-CM | POA: Diagnosis not present

## 2016-05-25 DIAGNOSIS — M7060 Trochanteric bursitis, unspecified hip: Secondary | ICD-10-CM | POA: Diagnosis not present

## 2016-05-26 ENCOUNTER — Ambulatory Visit: Payer: Self-pay

## 2016-05-26 ENCOUNTER — Other Ambulatory Visit: Payer: Self-pay

## 2016-05-26 DIAGNOSIS — M7060 Trochanteric bursitis, unspecified hip: Secondary | ICD-10-CM | POA: Diagnosis not present

## 2016-05-26 DIAGNOSIS — E8770 Fluid overload, unspecified: Secondary | ICD-10-CM | POA: Diagnosis not present

## 2016-05-26 DIAGNOSIS — N186 End stage renal disease: Secondary | ICD-10-CM | POA: Diagnosis not present

## 2016-05-26 NOTE — Patient Outreach (Signed)
[facility] Care Management  05/26/2016  Claudia Rich 1927-01-07 [id number]   Telephone Screen  Referral Date: 05/19/16 Referral Source: EMMI Prevent Referral Reason: " COPD, DM, HTN, kidney failure"   Outreach attempt # 1 to patient. No answer. RN CM left HIPAA compliant voicemail message along with contact info.     Plan: RN CM will make outreach attempt to patient within a week if no return call from patient.   Antionette Fairyoshanda Aleane Wesenberg, RN,BSN,CCM [facility] Care Management Telephonic Care Management Coordinator Direct Phone: [phone] Toll Free: [phone] Fax: [phone]

## 2016-05-27 ENCOUNTER — Other Ambulatory Visit: Payer: Self-pay | Admitting: Family Medicine

## 2016-05-27 DIAGNOSIS — M7060 Trochanteric bursitis, unspecified hip: Secondary | ICD-10-CM | POA: Diagnosis not present

## 2016-05-28 ENCOUNTER — Other Ambulatory Visit: Payer: Self-pay | Admitting: Family Medicine

## 2016-05-28 DIAGNOSIS — N186 End stage renal disease: Secondary | ICD-10-CM | POA: Diagnosis not present

## 2016-05-28 DIAGNOSIS — M7060 Trochanteric bursitis, unspecified hip: Secondary | ICD-10-CM | POA: Diagnosis not present

## 2016-05-28 DIAGNOSIS — E8770 Fluid overload, unspecified: Secondary | ICD-10-CM | POA: Diagnosis not present

## 2016-05-29 ENCOUNTER — Other Ambulatory Visit: Payer: Self-pay

## 2016-05-29 DIAGNOSIS — M7060 Trochanteric bursitis, unspecified hip: Secondary | ICD-10-CM | POA: Diagnosis not present

## 2016-05-29 NOTE — Patient Outreach (Addendum)
[facility] Care Management  05/29/2016  Claudia Rich 31-Aug-1926 [id number]   Telephone Screen  Referral Date: 05/19/16 Referral Source: EMMI Prevent Referral Reason: " COPD, DM, HTN, kidney failure"   Outreach attempt #3 to patient and niece/POA-Claudia Rich. No answer. RN CM had made multiple attempts to establish contact with patient.      Plan: RN CM will send unsuccessful outreach letter to patient and close case if no response from patient within 10 business days.   Antionette Fairyoshanda Micha Dosanjh, RN,BSN,CCM [facility] Care Management Telephonic Care Management Coordinator Direct Phone: [phone] Toll Free: [phone] Fax: [phone]

## 2016-05-30 DIAGNOSIS — M7060 Trochanteric bursitis, unspecified hip: Secondary | ICD-10-CM | POA: Diagnosis not present

## 2016-05-30 DIAGNOSIS — E8770 Fluid overload, unspecified: Secondary | ICD-10-CM | POA: Diagnosis not present

## 2016-05-30 DIAGNOSIS — N186 End stage renal disease: Secondary | ICD-10-CM | POA: Diagnosis not present

## 2016-05-31 DIAGNOSIS — M7060 Trochanteric bursitis, unspecified hip: Secondary | ICD-10-CM | POA: Diagnosis not present

## 2016-06-01 DIAGNOSIS — M7060 Trochanteric bursitis, unspecified hip: Secondary | ICD-10-CM | POA: Diagnosis not present

## 2016-06-02 DIAGNOSIS — E8770 Fluid overload, unspecified: Secondary | ICD-10-CM | POA: Diagnosis not present

## 2016-06-02 DIAGNOSIS — N186 End stage renal disease: Secondary | ICD-10-CM | POA: Diagnosis not present

## 2016-06-03 DIAGNOSIS — M7060 Trochanteric bursitis, unspecified hip: Secondary | ICD-10-CM | POA: Diagnosis not present

## 2016-06-04 DIAGNOSIS — N186 End stage renal disease: Secondary | ICD-10-CM | POA: Diagnosis not present

## 2016-06-04 DIAGNOSIS — E8770 Fluid overload, unspecified: Secondary | ICD-10-CM | POA: Diagnosis not present

## 2016-06-04 DIAGNOSIS — M7060 Trochanteric bursitis, unspecified hip: Secondary | ICD-10-CM | POA: Diagnosis not present

## 2016-06-05 DIAGNOSIS — M7060 Trochanteric bursitis, unspecified hip: Secondary | ICD-10-CM | POA: Diagnosis not present

## 2016-06-06 DIAGNOSIS — E8770 Fluid overload, unspecified: Secondary | ICD-10-CM | POA: Diagnosis not present

## 2016-06-06 DIAGNOSIS — M7060 Trochanteric bursitis, unspecified hip: Secondary | ICD-10-CM | POA: Diagnosis not present

## 2016-06-06 DIAGNOSIS — N186 End stage renal disease: Secondary | ICD-10-CM | POA: Diagnosis not present

## 2016-06-07 DIAGNOSIS — M7060 Trochanteric bursitis, unspecified hip: Secondary | ICD-10-CM | POA: Diagnosis not present

## 2016-06-08 DIAGNOSIS — M7060 Trochanteric bursitis, unspecified hip: Secondary | ICD-10-CM | POA: Diagnosis not present

## 2016-06-09 DIAGNOSIS — N186 End stage renal disease: Secondary | ICD-10-CM | POA: Diagnosis not present

## 2016-06-09 DIAGNOSIS — M7060 Trochanteric bursitis, unspecified hip: Secondary | ICD-10-CM | POA: Diagnosis not present

## 2016-06-09 DIAGNOSIS — E8770 Fluid overload, unspecified: Secondary | ICD-10-CM | POA: Diagnosis not present

## 2016-06-10 DIAGNOSIS — E8770 Fluid overload, unspecified: Secondary | ICD-10-CM | POA: Diagnosis not present

## 2016-06-10 DIAGNOSIS — N186 End stage renal disease: Secondary | ICD-10-CM | POA: Diagnosis not present

## 2016-06-11 DIAGNOSIS — M7060 Trochanteric bursitis, unspecified hip: Secondary | ICD-10-CM | POA: Diagnosis not present

## 2016-06-11 DIAGNOSIS — N186 End stage renal disease: Secondary | ICD-10-CM | POA: Diagnosis not present

## 2016-06-11 DIAGNOSIS — E8770 Fluid overload, unspecified: Secondary | ICD-10-CM | POA: Diagnosis not present

## 2016-06-12 ENCOUNTER — Other Ambulatory Visit: Payer: Self-pay

## 2016-06-12 DIAGNOSIS — M7060 Trochanteric bursitis, unspecified hip: Secondary | ICD-10-CM | POA: Diagnosis not present

## 2016-06-12 NOTE — Patient Outreach (Signed)
[facility] Care Management  06/12/2016  Claudia Rich [birth year] [id number]   Telephone Screen  Referral Date: 05/19/16 Referral Source: EMMI Prevent Referral Reason: " COPD, DM, HTN, kidney failure"   Multiple attempts to contact patient to discuss [facility] services. No response from letter mailed to patient. Case is being closed at this time.    Plan: RN CM will notify [facility] administrative assistant of case closure status. RN CM will send MD case closure letter.   Antionette Fairyoshanda Arbell Wycoff, RN,BSN,CCM [facility] Care Management Telephonic Care Management Coordinator Direct Phone: [phone] Toll Free: [phone] Fax: [phone]

## 2016-06-13 DIAGNOSIS — M7060 Trochanteric bursitis, unspecified hip: Secondary | ICD-10-CM | POA: Diagnosis not present

## 2016-06-13 DIAGNOSIS — E8770 Fluid overload, unspecified: Secondary | ICD-10-CM | POA: Diagnosis not present

## 2016-06-13 DIAGNOSIS — N186 End stage renal disease: Secondary | ICD-10-CM | POA: Diagnosis not present

## 2016-06-14 DIAGNOSIS — M7060 Trochanteric bursitis, unspecified hip: Secondary | ICD-10-CM | POA: Diagnosis not present

## 2016-06-15 DIAGNOSIS — M7060 Trochanteric bursitis, unspecified hip: Secondary | ICD-10-CM | POA: Diagnosis not present

## 2016-06-16 DIAGNOSIS — N186 End stage renal disease: Secondary | ICD-10-CM | POA: Diagnosis not present

## 2016-06-16 DIAGNOSIS — E8770 Fluid overload, unspecified: Secondary | ICD-10-CM | POA: Diagnosis not present

## 2016-06-16 DIAGNOSIS — M7060 Trochanteric bursitis, unspecified hip: Secondary | ICD-10-CM | POA: Diagnosis not present

## 2016-06-17 ENCOUNTER — Other Ambulatory Visit: Payer: Self-pay | Admitting: Family Medicine

## 2016-06-17 DIAGNOSIS — M7060 Trochanteric bursitis, unspecified hip: Secondary | ICD-10-CM | POA: Diagnosis not present

## 2016-06-18 DIAGNOSIS — E8770 Fluid overload, unspecified: Secondary | ICD-10-CM | POA: Diagnosis not present

## 2016-06-18 DIAGNOSIS — N186 End stage renal disease: Secondary | ICD-10-CM | POA: Diagnosis not present

## 2016-06-18 DIAGNOSIS — M7060 Trochanteric bursitis, unspecified hip: Secondary | ICD-10-CM | POA: Diagnosis not present

## 2016-06-19 DIAGNOSIS — M7060 Trochanteric bursitis, unspecified hip: Secondary | ICD-10-CM | POA: Diagnosis not present

## 2016-06-20 DIAGNOSIS — N186 End stage renal disease: Secondary | ICD-10-CM | POA: Diagnosis not present

## 2016-06-20 DIAGNOSIS — M7060 Trochanteric bursitis, unspecified hip: Secondary | ICD-10-CM | POA: Diagnosis not present

## 2016-06-20 DIAGNOSIS — E8770 Fluid overload, unspecified: Secondary | ICD-10-CM | POA: Diagnosis not present

## 2016-06-21 DIAGNOSIS — M7060 Trochanteric bursitis, unspecified hip: Secondary | ICD-10-CM | POA: Diagnosis not present

## 2016-06-22 DIAGNOSIS — M7060 Trochanteric bursitis, unspecified hip: Secondary | ICD-10-CM | POA: Diagnosis not present

## 2016-06-23 DIAGNOSIS — E8770 Fluid overload, unspecified: Secondary | ICD-10-CM | POA: Diagnosis not present

## 2016-06-23 DIAGNOSIS — E1122 Type 2 diabetes mellitus with diabetic chronic kidney disease: Secondary | ICD-10-CM | POA: Diagnosis not present

## 2016-06-23 DIAGNOSIS — N186 End stage renal disease: Secondary | ICD-10-CM | POA: Diagnosis not present

## 2016-06-23 DIAGNOSIS — M7060 Trochanteric bursitis, unspecified hip: Secondary | ICD-10-CM | POA: Diagnosis not present

## 2016-06-23 DIAGNOSIS — Z992 Dependence on renal dialysis: Secondary | ICD-10-CM | POA: Diagnosis not present

## 2016-06-24 DIAGNOSIS — M7060 Trochanteric bursitis, unspecified hip: Secondary | ICD-10-CM | POA: Diagnosis not present

## 2016-06-25 DIAGNOSIS — N186 End stage renal disease: Secondary | ICD-10-CM | POA: Diagnosis not present

## 2016-06-25 DIAGNOSIS — M7060 Trochanteric bursitis, unspecified hip: Secondary | ICD-10-CM | POA: Diagnosis not present

## 2016-06-26 DIAGNOSIS — M7060 Trochanteric bursitis, unspecified hip: Secondary | ICD-10-CM | POA: Diagnosis not present

## 2016-06-27 DIAGNOSIS — M7060 Trochanteric bursitis, unspecified hip: Secondary | ICD-10-CM | POA: Diagnosis not present

## 2016-06-27 DIAGNOSIS — N186 End stage renal disease: Secondary | ICD-10-CM | POA: Diagnosis not present

## 2016-06-28 DIAGNOSIS — M7060 Trochanteric bursitis, unspecified hip: Secondary | ICD-10-CM | POA: Diagnosis not present

## 2016-06-29 DIAGNOSIS — M7060 Trochanteric bursitis, unspecified hip: Secondary | ICD-10-CM | POA: Diagnosis not present

## 2016-06-30 DIAGNOSIS — N186 End stage renal disease: Secondary | ICD-10-CM | POA: Diagnosis not present

## 2016-06-30 DIAGNOSIS — M7060 Trochanteric bursitis, unspecified hip: Secondary | ICD-10-CM | POA: Diagnosis not present

## 2016-07-01 ENCOUNTER — Other Ambulatory Visit: Payer: Self-pay | Admitting: Family Medicine

## 2016-07-01 ENCOUNTER — Ambulatory Visit: Payer: Commercial Managed Care - HMO | Admitting: Family Medicine

## 2016-07-01 DIAGNOSIS — M7060 Trochanteric bursitis, unspecified hip: Secondary | ICD-10-CM | POA: Diagnosis not present

## 2016-07-02 DIAGNOSIS — N186 End stage renal disease: Secondary | ICD-10-CM | POA: Diagnosis not present

## 2016-07-02 DIAGNOSIS — M7060 Trochanteric bursitis, unspecified hip: Secondary | ICD-10-CM | POA: Diagnosis not present

## 2016-07-03 ENCOUNTER — Ambulatory Visit (INDEPENDENT_AMBULATORY_CARE_PROVIDER_SITE_OTHER): Payer: Commercial Managed Care - HMO | Admitting: Family Medicine

## 2016-07-03 ENCOUNTER — Encounter: Payer: Self-pay | Admitting: Family Medicine

## 2016-07-03 VITALS — BP 135/45 | HR 76 | Temp 97.9°F | Ht 66.0 in | Wt 154.2 lb

## 2016-07-03 DIAGNOSIS — G301 Alzheimer's disease with late onset: Secondary | ICD-10-CM

## 2016-07-03 DIAGNOSIS — R39198 Other difficulties with micturition: Secondary | ICD-10-CM | POA: Diagnosis not present

## 2016-07-03 DIAGNOSIS — E1122 Type 2 diabetes mellitus with diabetic chronic kidney disease: Secondary | ICD-10-CM | POA: Diagnosis not present

## 2016-07-03 DIAGNOSIS — F0280 Dementia in other diseases classified elsewhere without behavioral disturbance: Secondary | ICD-10-CM

## 2016-07-03 DIAGNOSIS — N186 End stage renal disease: Secondary | ICD-10-CM | POA: Diagnosis not present

## 2016-07-03 DIAGNOSIS — Z992 Dependence on renal dialysis: Secondary | ICD-10-CM

## 2016-07-03 MED ORDER — CEPHALEXIN 500 MG PO CAPS: 500.0000 mg | ORAL_CAPSULE | Freq: Every day | ORAL | 0 refills | Status: DC

## 2016-07-03 NOTE — Assessment & Plan Note (Signed)
Reports episodes of hypoglycemia in the mornings along with confusion. Last A1c 6. Will discontinue glipizide and continue Januvia at this time. To return in 3 months to recheck A1c.

## 2016-07-03 NOTE — Patient Instructions (Addendum)
Thank you so much for coming to visit today! We will stop the Glipizide. This will help with the episodes of low blood sugar. I have sent a course of antibiotics to the pharmacy. It has been renally adjusted, so it is just taken once a day. Please take after dialysis instead of before. Please follow up with Dr. McDiarmid here at our geriatric clinic.  Please discuss your lack of urination with your Nephrologist. It is important they know this so dialysis can be adjusted if needed. Please return in 3 months so we can check an A1C and see how the diabetes is doing. You may return sooner if your blood sugars are too high or low.  Dr. Caroleen Hammanumley

## 2016-07-03 NOTE — Progress Notes (Signed)
Subjective:     Patient ID: Claudia RidgesAddie R Rich, female   DOB: 1927/03/06, 82 y.o.   MRN: [id number]  HPI Mrs. Alvester MorinBell is a 82 year old female presenting today for multiple concerns. History of dementia noted, niece Claudia Rich power of attorney.  Spoke with niece Claudia Rich on the phone concerning her aunt's care. Reports concerns for hypoglycemia with blood sugars in the 70s and 80s in the mornings and increased confusion in the mornings. Currently prescribed Januvia and glipizide. Also reports decreased urine output. Notes history of UTIs and that her aunt does not usually present with the normal symptoms. She does not wish for her on to be catheterized to obtain urine. Wishes for UTI treatment given confusion and changes in urination. Reports her aunt goes to dialysis [facility] and positive [facility] Lowell GuitarPowell [facility].  Asked Mrs. Stene concerning her symptoms. Denies dysuria or urgency. She has also noted decreased urination, often only urinating a few times a week. Denies fever. Reports she feels well.  Review of Systems Per HPI.     Objective:   Physical Exam  Constitutional: She appears well-developed and well-nourished. No distress.  HENT:  Head: Normocephalic and atraumatic.  Moist mucous membranes.  Cardiovascular: Normal rate and regular rhythm.   Murmur heard. Pulmonary/Chest: Effort normal. No respiratory distress. She has no wheezes.  Abdominal: Soft. She exhibits no distension. There is no tenderness.  No CVA tenderness  Musculoskeletal: She exhibits no edema.  Psychiatric: She has a normal mood and affect. Her behavior is normal.  Oriented to person and place, not time. Able to remember 2 out of 3 objects and short recall. Asked to draw clock face, please see picture below.        Assessment and Plan:     DM2 (diabetes mellitus, type 2) Reports episodes of hypoglycemia in the mornings along with confusion. Last A1c 6. Will discontinue  glipizide and continue Januvia at this time. To return in 3 months to recheck A1c.  ESRD on dialysis Now with decreased urine output. Unable to give urine sample. Discussed with power of attorney that UTI is not high on differential, but she would like to proceed with empiric treatment of UTI. Keflex prescribed at renal dose of 500 mg every 24 hours. Recommended taking only after discussing with nephrology. To follow-up with nephrology.  Dementia Suspect contributing to morning confusion. Recommend following up in geriatrics clinic with Dr. Perley JainMcDiarmid.

## 2016-07-03 NOTE — Assessment & Plan Note (Signed)
Now with decreased urine output. Unable to give urine sample. Discussed with power of attorney that UTI is not high on differential, but she would like to proceed with empiric treatment of UTI. Keflex prescribed at renal dose of 500 mg every 24 hours. Recommended taking only after discussing with nephrology. To follow-up with nephrology.

## 2016-07-03 NOTE — Assessment & Plan Note (Addendum)
Suspect contributing to morning confusion. Recommend following up in geriatrics clinic with Dr. Perley JainMcDiarmid.

## 2016-07-04 DIAGNOSIS — N186 End stage renal disease: Secondary | ICD-10-CM | POA: Diagnosis not present

## 2016-07-04 DIAGNOSIS — M7060 Trochanteric bursitis, unspecified hip: Secondary | ICD-10-CM | POA: Diagnosis not present

## 2016-07-05 DIAGNOSIS — M7060 Trochanteric bursitis, unspecified hip: Secondary | ICD-10-CM | POA: Diagnosis not present

## 2016-07-06 DIAGNOSIS — M7060 Trochanteric bursitis, unspecified hip: Secondary | ICD-10-CM | POA: Diagnosis not present

## 2016-07-07 DIAGNOSIS — N186 End stage renal disease: Secondary | ICD-10-CM | POA: Diagnosis not present

## 2016-07-07 DIAGNOSIS — M7060 Trochanteric bursitis, unspecified hip: Secondary | ICD-10-CM | POA: Diagnosis not present

## 2016-07-08 DIAGNOSIS — M7060 Trochanteric bursitis, unspecified hip: Secondary | ICD-10-CM | POA: Diagnosis not present

## 2016-07-09 DIAGNOSIS — M7060 Trochanteric bursitis, unspecified hip: Secondary | ICD-10-CM | POA: Diagnosis not present

## 2016-07-09 DIAGNOSIS — N186 End stage renal disease: Secondary | ICD-10-CM | POA: Diagnosis not present

## 2016-07-10 DIAGNOSIS — M7060 Trochanteric bursitis, unspecified hip: Secondary | ICD-10-CM | POA: Diagnosis not present

## 2016-07-11 DIAGNOSIS — N186 End stage renal disease: Secondary | ICD-10-CM | POA: Diagnosis not present

## 2016-07-11 DIAGNOSIS — M7060 Trochanteric bursitis, unspecified hip: Secondary | ICD-10-CM | POA: Diagnosis not present

## 2016-07-12 DIAGNOSIS — M7060 Trochanteric bursitis, unspecified hip: Secondary | ICD-10-CM | POA: Diagnosis not present

## 2016-07-13 DIAGNOSIS — M7060 Trochanteric bursitis, unspecified hip: Secondary | ICD-10-CM | POA: Diagnosis not present

## 2016-07-13 DIAGNOSIS — N186 End stage renal disease: Secondary | ICD-10-CM | POA: Diagnosis not present

## 2016-07-15 DIAGNOSIS — N186 End stage renal disease: Secondary | ICD-10-CM | POA: Diagnosis not present

## 2016-07-15 DIAGNOSIS — M7060 Trochanteric bursitis, unspecified hip: Secondary | ICD-10-CM | POA: Diagnosis not present

## 2016-07-17 DIAGNOSIS — M7060 Trochanteric bursitis, unspecified hip: Secondary | ICD-10-CM | POA: Diagnosis not present

## 2016-07-18 DIAGNOSIS — M7060 Trochanteric bursitis, unspecified hip: Secondary | ICD-10-CM | POA: Diagnosis not present

## 2016-07-18 DIAGNOSIS — N186 End stage renal disease: Secondary | ICD-10-CM | POA: Diagnosis not present

## 2016-07-20 DIAGNOSIS — M7060 Trochanteric bursitis, unspecified hip: Secondary | ICD-10-CM | POA: Diagnosis not present

## 2016-07-21 DIAGNOSIS — M7060 Trochanteric bursitis, unspecified hip: Secondary | ICD-10-CM | POA: Diagnosis not present

## 2016-07-21 DIAGNOSIS — N186 End stage renal disease: Secondary | ICD-10-CM | POA: Diagnosis not present

## 2016-07-22 DIAGNOSIS — M7060 Trochanteric bursitis, unspecified hip: Secondary | ICD-10-CM | POA: Diagnosis not present

## 2016-07-23 DIAGNOSIS — N186 End stage renal disease: Secondary | ICD-10-CM | POA: Diagnosis not present

## 2016-07-23 DIAGNOSIS — Z992 Dependence on renal dialysis: Secondary | ICD-10-CM | POA: Diagnosis not present

## 2016-07-23 DIAGNOSIS — M7060 Trochanteric bursitis, unspecified hip: Secondary | ICD-10-CM | POA: Diagnosis not present

## 2016-07-23 DIAGNOSIS — E1122 Type 2 diabetes mellitus with diabetic chronic kidney disease: Secondary | ICD-10-CM | POA: Diagnosis not present

## 2016-07-24 DIAGNOSIS — M7060 Trochanteric bursitis, unspecified hip: Secondary | ICD-10-CM | POA: Diagnosis not present

## 2016-07-25 DIAGNOSIS — M7060 Trochanteric bursitis, unspecified hip: Secondary | ICD-10-CM | POA: Diagnosis not present

## 2016-07-25 DIAGNOSIS — N186 End stage renal disease: Secondary | ICD-10-CM | POA: Diagnosis not present

## 2016-07-26 DIAGNOSIS — M7060 Trochanteric bursitis, unspecified hip: Secondary | ICD-10-CM | POA: Diagnosis not present

## 2016-07-27 DIAGNOSIS — M7060 Trochanteric bursitis, unspecified hip: Secondary | ICD-10-CM | POA: Diagnosis not present

## 2016-07-28 DIAGNOSIS — N186 End stage renal disease: Secondary | ICD-10-CM | POA: Diagnosis not present

## 2016-07-29 ENCOUNTER — Other Ambulatory Visit: Payer: Self-pay | Admitting: Family Medicine

## 2016-07-29 DIAGNOSIS — M7060 Trochanteric bursitis, unspecified hip: Secondary | ICD-10-CM | POA: Diagnosis not present

## 2016-07-30 DIAGNOSIS — M7060 Trochanteric bursitis, unspecified hip: Secondary | ICD-10-CM | POA: Diagnosis not present

## 2016-07-30 DIAGNOSIS — N186 End stage renal disease: Secondary | ICD-10-CM | POA: Diagnosis not present

## 2016-07-31 DIAGNOSIS — M7060 Trochanteric bursitis, unspecified hip: Secondary | ICD-10-CM | POA: Diagnosis not present

## 2016-08-01 DIAGNOSIS — N186 End stage renal disease: Secondary | ICD-10-CM | POA: Diagnosis not present

## 2016-08-01 DIAGNOSIS — M7060 Trochanteric bursitis, unspecified hip: Secondary | ICD-10-CM | POA: Diagnosis not present

## 2016-08-03 DIAGNOSIS — M7060 Trochanteric bursitis, unspecified hip: Secondary | ICD-10-CM | POA: Diagnosis not present

## 2016-08-04 DIAGNOSIS — M7060 Trochanteric bursitis, unspecified hip: Secondary | ICD-10-CM | POA: Diagnosis not present

## 2016-08-04 DIAGNOSIS — N186 End stage renal disease: Secondary | ICD-10-CM | POA: Diagnosis not present

## 2016-08-05 ENCOUNTER — Other Ambulatory Visit: Payer: Self-pay | Admitting: Family Medicine

## 2016-08-05 DIAGNOSIS — M7060 Trochanteric bursitis, unspecified hip: Secondary | ICD-10-CM | POA: Diagnosis not present

## 2016-08-06 ENCOUNTER — Telehealth: Payer: Self-pay | Admitting: Family Medicine

## 2016-08-06 DIAGNOSIS — M7060 Trochanteric bursitis, unspecified hip: Secondary | ICD-10-CM | POA: Diagnosis not present

## 2016-08-06 DIAGNOSIS — N186 End stage renal disease: Secondary | ICD-10-CM | POA: Diagnosis not present

## 2016-08-06 NOTE — Telephone Encounter (Signed)
Beryl MeagerOliva Miles the guardian for Ms. [place]art[place] [facility] [place] to stay with family. The patient is on dialysis and [facility] be [facility] [place]. They need a new referral since the one from last has expired. The number is [phone]. Myriam Jacobsonjw

## 2016-08-07 DIAGNOSIS — M7060 Trochanteric bursitis, unspecified hip: Secondary | ICD-10-CM | POA: Diagnosis not present

## 2016-08-08 DIAGNOSIS — N186 End stage renal disease: Secondary | ICD-10-CM | POA: Diagnosis not present

## 2016-08-08 DIAGNOSIS — M7060 Trochanteric bursitis, unspecified hip: Secondary | ICD-10-CM | POA: Diagnosis not present

## 2016-08-09 DIAGNOSIS — M7060 Trochanteric bursitis, unspecified hip: Secondary | ICD-10-CM | POA: Diagnosis not present

## 2016-08-10 DIAGNOSIS — E1151 Type 2 diabetes mellitus with diabetic peripheral angiopathy without gangrene: Secondary | ICD-10-CM | POA: Diagnosis not present

## 2016-08-10 DIAGNOSIS — M7060 Trochanteric bursitis, unspecified hip: Secondary | ICD-10-CM | POA: Diagnosis not present

## 2016-08-10 DIAGNOSIS — L84 Corns and callosities: Secondary | ICD-10-CM | POA: Diagnosis not present

## 2016-08-10 DIAGNOSIS — L602 Onychogryphosis: Secondary | ICD-10-CM | POA: Diagnosis not present

## 2016-08-11 DIAGNOSIS — N186 End stage renal disease: Secondary | ICD-10-CM | POA: Diagnosis not present

## 2016-08-12 DIAGNOSIS — M7060 Trochanteric bursitis, unspecified hip: Secondary | ICD-10-CM | POA: Diagnosis not present

## 2016-08-13 DIAGNOSIS — N186 End stage renal disease: Secondary | ICD-10-CM | POA: Diagnosis not present

## 2016-08-15 DIAGNOSIS — N186 End stage renal disease: Secondary | ICD-10-CM | POA: Diagnosis not present

## 2016-08-18 ENCOUNTER — Other Ambulatory Visit: Payer: Self-pay | Admitting: Family Medicine

## 2016-08-18 DIAGNOSIS — N186 End stage renal disease: Secondary | ICD-10-CM | POA: Diagnosis not present

## 2016-08-20 ENCOUNTER — Telehealth: Payer: Self-pay

## 2016-08-20 DIAGNOSIS — N186 End stage renal disease: Secondary | ICD-10-CM | POA: Diagnosis not present

## 2016-08-20 NOTE — Telephone Encounter (Signed)
Claudia Rich the HPOA for Claudia Rich called. Zantac Rx was refused. Claudia Rich would like to know why? Nothing was mentioned at the appt she had about a month ago. Should something else replace the medication?  Please call Claudia Rich. [phone] to advise. Sunday SpillersSharon T Payslie Mccaig, CMA

## 2016-08-22 DIAGNOSIS — N186 End stage renal disease: Secondary | ICD-10-CM | POA: Diagnosis not present

## 2016-08-23 DIAGNOSIS — Z992 Dependence on renal dialysis: Secondary | ICD-10-CM | POA: Diagnosis not present

## 2016-08-23 DIAGNOSIS — N186 End stage renal disease: Secondary | ICD-10-CM | POA: Diagnosis not present

## 2016-08-23 DIAGNOSIS — E1122 Type 2 diabetes mellitus with diabetic chronic kidney disease: Secondary | ICD-10-CM | POA: Diagnosis not present

## 2016-08-25 DIAGNOSIS — N186 End stage renal disease: Secondary | ICD-10-CM | POA: Diagnosis not present

## 2016-08-25 DIAGNOSIS — N2581 Secondary hyperparathyroidism of renal origin: Secondary | ICD-10-CM | POA: Diagnosis not present

## 2016-08-27 ENCOUNTER — Other Ambulatory Visit: Payer: Self-pay | Admitting: Family Medicine

## 2016-08-27 DIAGNOSIS — N186 End stage renal disease: Secondary | ICD-10-CM | POA: Diagnosis not present

## 2016-08-27 DIAGNOSIS — N2581 Secondary hyperparathyroidism of renal origin: Secondary | ICD-10-CM | POA: Diagnosis not present

## 2016-08-27 MED ORDER — RANITIDINE HCL 150 MG PO TABS: 150.0000 mg | ORAL_TABLET | Freq: Two times a day (BID) | ORAL | 0 refills | Status: DC

## 2016-08-29 DIAGNOSIS — N186 End stage renal disease: Secondary | ICD-10-CM | POA: Diagnosis not present

## 2016-08-29 DIAGNOSIS — N2581 Secondary hyperparathyroidism of renal origin: Secondary | ICD-10-CM | POA: Diagnosis not present

## 2016-09-01 DIAGNOSIS — N2581 Secondary hyperparathyroidism of renal origin: Secondary | ICD-10-CM | POA: Diagnosis not present

## 2016-09-01 DIAGNOSIS — N186 End stage renal disease: Secondary | ICD-10-CM | POA: Diagnosis not present

## 2016-09-03 DIAGNOSIS — N2581 Secondary hyperparathyroidism of renal origin: Secondary | ICD-10-CM | POA: Diagnosis not present

## 2016-09-03 DIAGNOSIS — N186 End stage renal disease: Secondary | ICD-10-CM | POA: Diagnosis not present

## 2016-09-05 DIAGNOSIS — N186 End stage renal disease: Secondary | ICD-10-CM | POA: Diagnosis not present

## 2016-09-05 DIAGNOSIS — N2581 Secondary hyperparathyroidism of renal origin: Secondary | ICD-10-CM | POA: Diagnosis not present

## 2016-09-08 DIAGNOSIS — N186 End stage renal disease: Secondary | ICD-10-CM | POA: Diagnosis not present

## 2016-09-08 DIAGNOSIS — N2581 Secondary hyperparathyroidism of renal origin: Secondary | ICD-10-CM | POA: Diagnosis not present

## 2016-09-10 DIAGNOSIS — N2581 Secondary hyperparathyroidism of renal origin: Secondary | ICD-10-CM | POA: Diagnosis not present

## 2016-09-10 DIAGNOSIS — N186 End stage renal disease: Secondary | ICD-10-CM | POA: Diagnosis not present

## 2016-09-12 DIAGNOSIS — N2581 Secondary hyperparathyroidism of renal origin: Secondary | ICD-10-CM | POA: Diagnosis not present

## 2016-09-12 DIAGNOSIS — N186 End stage renal disease: Secondary | ICD-10-CM | POA: Diagnosis not present

## 2016-09-15 DIAGNOSIS — N2581 Secondary hyperparathyroidism of renal origin: Secondary | ICD-10-CM | POA: Diagnosis not present

## 2016-09-15 DIAGNOSIS — N186 End stage renal disease: Secondary | ICD-10-CM | POA: Diagnosis not present

## 2016-09-17 DIAGNOSIS — N2581 Secondary hyperparathyroidism of renal origin: Secondary | ICD-10-CM | POA: Diagnosis not present

## 2016-09-17 DIAGNOSIS — N186 End stage renal disease: Secondary | ICD-10-CM | POA: Diagnosis not present

## 2016-09-19 DIAGNOSIS — N2581 Secondary hyperparathyroidism of renal origin: Secondary | ICD-10-CM | POA: Diagnosis not present

## 2016-09-19 DIAGNOSIS — N186 End stage renal disease: Secondary | ICD-10-CM | POA: Diagnosis not present

## 2016-09-22 DIAGNOSIS — N2581 Secondary hyperparathyroidism of renal origin: Secondary | ICD-10-CM | POA: Diagnosis not present

## 2016-09-22 DIAGNOSIS — N186 End stage renal disease: Secondary | ICD-10-CM | POA: Diagnosis not present

## 2016-09-24 DIAGNOSIS — N186 End stage renal disease: Secondary | ICD-10-CM | POA: Diagnosis not present

## 2016-09-24 DIAGNOSIS — N2581 Secondary hyperparathyroidism of renal origin: Secondary | ICD-10-CM | POA: Diagnosis not present

## 2016-09-26 DIAGNOSIS — N186 End stage renal disease: Secondary | ICD-10-CM | POA: Diagnosis not present

## 2016-09-26 DIAGNOSIS — N2581 Secondary hyperparathyroidism of renal origin: Secondary | ICD-10-CM | POA: Diagnosis not present

## 2016-09-29 DIAGNOSIS — N2581 Secondary hyperparathyroidism of renal origin: Secondary | ICD-10-CM | POA: Diagnosis not present

## 2016-09-29 DIAGNOSIS — N186 End stage renal disease: Secondary | ICD-10-CM | POA: Diagnosis not present

## 2016-10-01 DIAGNOSIS — N2581 Secondary hyperparathyroidism of renal origin: Secondary | ICD-10-CM | POA: Diagnosis not present

## 2016-10-01 DIAGNOSIS — N186 End stage renal disease: Secondary | ICD-10-CM | POA: Diagnosis not present

## 2016-10-03 DIAGNOSIS — N2581 Secondary hyperparathyroidism of renal origin: Secondary | ICD-10-CM | POA: Diagnosis not present

## 2016-10-03 DIAGNOSIS — N186 End stage renal disease: Secondary | ICD-10-CM | POA: Diagnosis not present

## 2016-10-06 DIAGNOSIS — N186 End stage renal disease: Secondary | ICD-10-CM | POA: Diagnosis not present

## 2016-10-06 DIAGNOSIS — N2581 Secondary hyperparathyroidism of renal origin: Secondary | ICD-10-CM | POA: Diagnosis not present

## 2016-10-08 DIAGNOSIS — N2581 Secondary hyperparathyroidism of renal origin: Secondary | ICD-10-CM | POA: Diagnosis not present

## 2016-10-08 DIAGNOSIS — N186 End stage renal disease: Secondary | ICD-10-CM | POA: Diagnosis not present

## 2016-10-10 DIAGNOSIS — N186 End stage renal disease: Secondary | ICD-10-CM | POA: Diagnosis not present

## 2016-10-10 DIAGNOSIS — N2581 Secondary hyperparathyroidism of renal origin: Secondary | ICD-10-CM | POA: Diagnosis not present

## 2016-10-12 DIAGNOSIS — N186 End stage renal disease: Secondary | ICD-10-CM | POA: Diagnosis not present

## 2016-10-12 DIAGNOSIS — E8770 Fluid overload, unspecified: Secondary | ICD-10-CM | POA: Diagnosis not present

## 2016-10-12 DIAGNOSIS — N2581 Secondary hyperparathyroidism of renal origin: Secondary | ICD-10-CM | POA: Diagnosis not present

## 2016-10-13 ENCOUNTER — Other Ambulatory Visit: Payer: Self-pay | Admitting: Family Medicine

## 2016-10-13 DIAGNOSIS — E8770 Fluid overload, unspecified: Secondary | ICD-10-CM | POA: Diagnosis not present

## 2016-10-13 DIAGNOSIS — N2581 Secondary hyperparathyroidism of renal origin: Secondary | ICD-10-CM | POA: Diagnosis not present

## 2016-10-13 DIAGNOSIS — N186 End stage renal disease: Secondary | ICD-10-CM | POA: Diagnosis not present

## 2016-10-13 DIAGNOSIS — M7060 Trochanteric bursitis, unspecified hip: Secondary | ICD-10-CM | POA: Diagnosis not present

## 2016-10-14 DIAGNOSIS — M7060 Trochanteric bursitis, unspecified hip: Secondary | ICD-10-CM | POA: Diagnosis not present

## 2016-10-15 DIAGNOSIS — E8770 Fluid overload, unspecified: Secondary | ICD-10-CM | POA: Diagnosis not present

## 2016-10-15 DIAGNOSIS — M7060 Trochanteric bursitis, unspecified hip: Secondary | ICD-10-CM | POA: Diagnosis not present

## 2016-10-15 DIAGNOSIS — N2581 Secondary hyperparathyroidism of renal origin: Secondary | ICD-10-CM | POA: Diagnosis not present

## 2016-10-15 DIAGNOSIS — N186 End stage renal disease: Secondary | ICD-10-CM | POA: Diagnosis not present

## 2016-10-16 ENCOUNTER — Other Ambulatory Visit: Payer: Self-pay | Admitting: Family Medicine

## 2016-10-16 DIAGNOSIS — M7060 Trochanteric bursitis, unspecified hip: Secondary | ICD-10-CM | POA: Diagnosis not present

## 2016-10-17 DIAGNOSIS — E8770 Fluid overload, unspecified: Secondary | ICD-10-CM | POA: Diagnosis not present

## 2016-10-17 DIAGNOSIS — N2581 Secondary hyperparathyroidism of renal origin: Secondary | ICD-10-CM | POA: Diagnosis not present

## 2016-10-17 DIAGNOSIS — N186 End stage renal disease: Secondary | ICD-10-CM | POA: Diagnosis not present

## 2016-10-17 DIAGNOSIS — M7060 Trochanteric bursitis, unspecified hip: Secondary | ICD-10-CM | POA: Diagnosis not present

## 2016-10-18 DIAGNOSIS — M7060 Trochanteric bursitis, unspecified hip: Secondary | ICD-10-CM | POA: Diagnosis not present

## 2016-10-19 DIAGNOSIS — M7060 Trochanteric bursitis, unspecified hip: Secondary | ICD-10-CM | POA: Diagnosis not present

## 2016-10-20 DIAGNOSIS — N2581 Secondary hyperparathyroidism of renal origin: Secondary | ICD-10-CM | POA: Diagnosis not present

## 2016-10-20 DIAGNOSIS — N186 End stage renal disease: Secondary | ICD-10-CM | POA: Diagnosis not present

## 2016-10-20 DIAGNOSIS — E8770 Fluid overload, unspecified: Secondary | ICD-10-CM | POA: Diagnosis not present

## 2016-10-20 DIAGNOSIS — M7060 Trochanteric bursitis, unspecified hip: Secondary | ICD-10-CM | POA: Diagnosis not present

## 2016-10-21 DIAGNOSIS — N186 End stage renal disease: Secondary | ICD-10-CM | POA: Diagnosis not present

## 2016-10-21 DIAGNOSIS — E1122 Type 2 diabetes mellitus with diabetic chronic kidney disease: Secondary | ICD-10-CM | POA: Diagnosis not present

## 2016-10-21 DIAGNOSIS — Z992 Dependence on renal dialysis: Secondary | ICD-10-CM | POA: Diagnosis not present

## 2016-10-21 DIAGNOSIS — M7060 Trochanteric bursitis, unspecified hip: Secondary | ICD-10-CM | POA: Diagnosis not present

## 2016-10-22 DIAGNOSIS — N186 End stage renal disease: Secondary | ICD-10-CM | POA: Diagnosis not present

## 2016-10-22 DIAGNOSIS — M7060 Trochanteric bursitis, unspecified hip: Secondary | ICD-10-CM | POA: Diagnosis not present

## 2016-10-22 DIAGNOSIS — N2581 Secondary hyperparathyroidism of renal origin: Secondary | ICD-10-CM | POA: Diagnosis not present

## 2016-10-23 DIAGNOSIS — M7060 Trochanteric bursitis, unspecified hip: Secondary | ICD-10-CM | POA: Diagnosis not present

## 2016-10-24 DIAGNOSIS — N186 End stage renal disease: Secondary | ICD-10-CM | POA: Diagnosis not present

## 2016-10-24 DIAGNOSIS — M7060 Trochanteric bursitis, unspecified hip: Secondary | ICD-10-CM | POA: Diagnosis not present

## 2016-10-24 DIAGNOSIS — N2581 Secondary hyperparathyroidism of renal origin: Secondary | ICD-10-CM | POA: Diagnosis not present

## 2016-10-25 DIAGNOSIS — M7060 Trochanteric bursitis, unspecified hip: Secondary | ICD-10-CM | POA: Diagnosis not present

## 2016-10-26 DIAGNOSIS — M7060 Trochanteric bursitis, unspecified hip: Secondary | ICD-10-CM | POA: Diagnosis not present

## 2016-10-27 DIAGNOSIS — M7060 Trochanteric bursitis, unspecified hip: Secondary | ICD-10-CM | POA: Diagnosis not present

## 2016-10-27 DIAGNOSIS — N2581 Secondary hyperparathyroidism of renal origin: Secondary | ICD-10-CM | POA: Diagnosis not present

## 2016-10-27 DIAGNOSIS — N186 End stage renal disease: Secondary | ICD-10-CM | POA: Diagnosis not present

## 2016-10-28 DIAGNOSIS — M7060 Trochanteric bursitis, unspecified hip: Secondary | ICD-10-CM | POA: Diagnosis not present

## 2016-10-28 DIAGNOSIS — L602 Onychogryphosis: Secondary | ICD-10-CM | POA: Diagnosis not present

## 2016-10-28 DIAGNOSIS — L84 Corns and callosities: Secondary | ICD-10-CM | POA: Diagnosis not present

## 2016-10-28 DIAGNOSIS — E1151 Type 2 diabetes mellitus with diabetic peripheral angiopathy without gangrene: Secondary | ICD-10-CM | POA: Diagnosis not present

## 2016-10-28 DIAGNOSIS — I739 Peripheral vascular disease, unspecified: Secondary | ICD-10-CM | POA: Diagnosis not present

## 2016-10-29 DIAGNOSIS — M7060 Trochanteric bursitis, unspecified hip: Secondary | ICD-10-CM | POA: Diagnosis not present

## 2016-10-29 DIAGNOSIS — N2581 Secondary hyperparathyroidism of renal origin: Secondary | ICD-10-CM | POA: Diagnosis not present

## 2016-10-29 DIAGNOSIS — N186 End stage renal disease: Secondary | ICD-10-CM | POA: Diagnosis not present

## 2016-10-30 DIAGNOSIS — M7060 Trochanteric bursitis, unspecified hip: Secondary | ICD-10-CM | POA: Diagnosis not present

## 2016-10-31 DIAGNOSIS — N186 End stage renal disease: Secondary | ICD-10-CM | POA: Diagnosis not present

## 2016-10-31 DIAGNOSIS — N2581 Secondary hyperparathyroidism of renal origin: Secondary | ICD-10-CM | POA: Diagnosis not present

## 2016-10-31 DIAGNOSIS — M7060 Trochanteric bursitis, unspecified hip: Secondary | ICD-10-CM | POA: Diagnosis not present

## 2016-11-01 DIAGNOSIS — M7060 Trochanteric bursitis, unspecified hip: Secondary | ICD-10-CM | POA: Diagnosis not present

## 2016-11-03 DIAGNOSIS — M7060 Trochanteric bursitis, unspecified hip: Secondary | ICD-10-CM | POA: Diagnosis not present

## 2016-11-03 DIAGNOSIS — N2581 Secondary hyperparathyroidism of renal origin: Secondary | ICD-10-CM | POA: Diagnosis not present

## 2016-11-03 DIAGNOSIS — N186 End stage renal disease: Secondary | ICD-10-CM | POA: Diagnosis not present

## 2016-11-04 ENCOUNTER — Other Ambulatory Visit: Payer: Self-pay | Admitting: Family Medicine

## 2016-11-04 DIAGNOSIS — M7060 Trochanteric bursitis, unspecified hip: Secondary | ICD-10-CM | POA: Diagnosis not present

## 2016-11-05 DIAGNOSIS — M7060 Trochanteric bursitis, unspecified hip: Secondary | ICD-10-CM | POA: Diagnosis not present

## 2016-11-05 DIAGNOSIS — N186 End stage renal disease: Secondary | ICD-10-CM | POA: Diagnosis not present

## 2016-11-05 DIAGNOSIS — N2581 Secondary hyperparathyroidism of renal origin: Secondary | ICD-10-CM | POA: Diagnosis not present

## 2016-11-06 ENCOUNTER — Telehealth: Payer: Self-pay | Admitting: Family Medicine

## 2016-11-06 DIAGNOSIS — M7060 Trochanteric bursitis, unspecified hip: Secondary | ICD-10-CM | POA: Diagnosis not present

## 2016-11-06 NOTE — Telephone Encounter (Signed)
Pts glucose levels have been running around 200 in the morning. Guardian [name] would like for pt to take junavia in the morning and 1 glipizide in the evening. An appt was made for 11-23-16. Please call Ms Marvis MoellerMiles back with the answer.

## 2016-11-07 DIAGNOSIS — N2581 Secondary hyperparathyroidism of renal origin: Secondary | ICD-10-CM | POA: Diagnosis not present

## 2016-11-07 DIAGNOSIS — N186 End stage renal disease: Secondary | ICD-10-CM | POA: Diagnosis not present

## 2016-11-07 DIAGNOSIS — M7060 Trochanteric bursitis, unspecified hip: Secondary | ICD-10-CM | POA: Diagnosis not present

## 2016-11-08 DIAGNOSIS — M7060 Trochanteric bursitis, unspecified hip: Secondary | ICD-10-CM | POA: Diagnosis not present

## 2016-11-09 DIAGNOSIS — M7060 Trochanteric bursitis, unspecified hip: Secondary | ICD-10-CM | POA: Diagnosis not present

## 2016-11-10 DIAGNOSIS — N2581 Secondary hyperparathyroidism of renal origin: Secondary | ICD-10-CM | POA: Diagnosis not present

## 2016-11-10 DIAGNOSIS — M7060 Trochanteric bursitis, unspecified hip: Secondary | ICD-10-CM | POA: Diagnosis not present

## 2016-11-10 DIAGNOSIS — N186 End stage renal disease: Secondary | ICD-10-CM | POA: Diagnosis not present

## 2016-11-11 DIAGNOSIS — M7060 Trochanteric bursitis, unspecified hip: Secondary | ICD-10-CM | POA: Diagnosis not present

## 2016-11-12 DIAGNOSIS — N186 End stage renal disease: Secondary | ICD-10-CM | POA: Diagnosis not present

## 2016-11-12 DIAGNOSIS — N2581 Secondary hyperparathyroidism of renal origin: Secondary | ICD-10-CM | POA: Diagnosis not present

## 2016-11-12 DIAGNOSIS — M7060 Trochanteric bursitis, unspecified hip: Secondary | ICD-10-CM | POA: Diagnosis not present

## 2016-11-13 DIAGNOSIS — M7060 Trochanteric bursitis, unspecified hip: Secondary | ICD-10-CM | POA: Diagnosis not present

## 2016-11-14 DIAGNOSIS — N2581 Secondary hyperparathyroidism of renal origin: Secondary | ICD-10-CM | POA: Diagnosis not present

## 2016-11-14 DIAGNOSIS — M7060 Trochanteric bursitis, unspecified hip: Secondary | ICD-10-CM | POA: Diagnosis not present

## 2016-11-14 DIAGNOSIS — N186 End stage renal disease: Secondary | ICD-10-CM | POA: Diagnosis not present

## 2016-11-17 DIAGNOSIS — N186 End stage renal disease: Secondary | ICD-10-CM | POA: Diagnosis not present

## 2016-11-17 DIAGNOSIS — N2581 Secondary hyperparathyroidism of renal origin: Secondary | ICD-10-CM | POA: Diagnosis not present

## 2016-11-19 DIAGNOSIS — N2581 Secondary hyperparathyroidism of renal origin: Secondary | ICD-10-CM | POA: Diagnosis not present

## 2016-11-19 DIAGNOSIS — N186 End stage renal disease: Secondary | ICD-10-CM | POA: Diagnosis not present

## 2016-11-21 DIAGNOSIS — N186 End stage renal disease: Secondary | ICD-10-CM | POA: Diagnosis not present

## 2016-11-21 DIAGNOSIS — E1122 Type 2 diabetes mellitus with diabetic chronic kidney disease: Secondary | ICD-10-CM | POA: Diagnosis not present

## 2016-11-21 DIAGNOSIS — N2581 Secondary hyperparathyroidism of renal origin: Secondary | ICD-10-CM | POA: Diagnosis not present

## 2016-11-21 DIAGNOSIS — Z992 Dependence on renal dialysis: Secondary | ICD-10-CM | POA: Diagnosis not present

## 2016-11-22 DIAGNOSIS — M7060 Trochanteric bursitis, unspecified hip: Secondary | ICD-10-CM | POA: Diagnosis not present

## 2016-11-23 ENCOUNTER — Ambulatory Visit (INDEPENDENT_AMBULATORY_CARE_PROVIDER_SITE_OTHER): Payer: Medicare HMO | Admitting: Family Medicine

## 2016-11-23 ENCOUNTER — Encounter: Payer: Self-pay | Admitting: Family Medicine

## 2016-11-23 VITALS — BP 132/52 | HR 79 | Temp 97.6°F | Ht 66.0 in | Wt 154.0 lb

## 2016-11-23 DIAGNOSIS — N186 End stage renal disease: Secondary | ICD-10-CM | POA: Diagnosis not present

## 2016-11-23 DIAGNOSIS — E1122 Type 2 diabetes mellitus with diabetic chronic kidney disease: Secondary | ICD-10-CM | POA: Diagnosis not present

## 2016-11-23 DIAGNOSIS — Z992 Dependence on renal dialysis: Secondary | ICD-10-CM

## 2016-11-23 LAB — POCT GLYCOSYLATED HEMOGLOBIN (HGB A1C): Hemoglobin A1C: 7.9

## 2016-11-23 NOTE — Progress Notes (Signed)
Subjective:     Patient ID: Claudia Rich, female   DOB: 17-Dec-1926, 83 y.o.   MRN: [id number]  HPI Mrs. Claudia Rich is an 83yo female presenting today for diabetes follow up. Mrs. Claudia Rich request I speak with her niece. Niece contacted and placed on speaker phone. Reports her aunt's blood sugars have been around 180-200. She is currently taking her Januvia. Problems with hypoglycemia noted in the past with A1C 6 at last check. Currently on HD. Denies any acute complaints today, including pain, shortness of breath, wheezing.  Review of Systems Per HPI    Objective:   Physical Exam  Constitutional: She appears well-developed and well-nourished. No distress.  Cardiovascular: Normal rate and regular rhythm.   No murmur heard. Pulmonary/Chest: Effort normal. No respiratory distress. She has no wheezes.  Musculoskeletal: She exhibits no edema.  Psychiatric: She has a normal mood and affect. Her behavior is normal.      Assessment and Plan:     1. Type 2 diabetes mellitus with chronic kidney disease on chronic dialysis, without long-term current use of insulin (HCC) A1C 7.9 today, at goal of ~8 for her age. Continue Januvia. Will recheck A1C in 3months--consider additional medical management if rises above goal. Will also obtain CMP today.

## 2016-11-23 NOTE — Patient Instructions (Signed)
Thank you so much for coming to visit today! We will continue the Januvia for now. We will recheck your A1C in 3months and if it continues to rise, we may consider changing your medications. Your goal A1C with your age is around 43...your A1C today was 7.9. Please return in 3months or sooner if needed.  Dr. Caroleen Hamman

## 2016-11-23 NOTE — Progress Notes (Signed)
po

## 2016-11-24 DIAGNOSIS — M7060 Trochanteric bursitis, unspecified hip: Secondary | ICD-10-CM | POA: Diagnosis not present

## 2016-11-24 DIAGNOSIS — N186 End stage renal disease: Secondary | ICD-10-CM | POA: Diagnosis not present

## 2016-11-24 DIAGNOSIS — N2581 Secondary hyperparathyroidism of renal origin: Secondary | ICD-10-CM | POA: Diagnosis not present

## 2016-11-24 LAB — COMPREHENSIVE METABOLIC PANEL
ALK PHOS: 174 IU/L — AB (ref 39–117)
ALT: 9 IU/L (ref 0–32)
AST: 14 IU/L (ref 0–40)
Albumin/Globulin Ratio: 0.8 — ABNORMAL LOW (ref 1.2–2.2)
Albumin: 3.7 g/dL (ref 3.5–4.7)
BUN/Creatinine Ratio: 4 — ABNORMAL LOW (ref 12–28)
BUN: 32 mg/dL — ABNORMAL HIGH (ref 8–27)
Bilirubin Total: 0.4 mg/dL (ref 0.0–1.2)
CHLORIDE: 92 mmol/L — AB (ref 96–106)
CO2: 25 mmol/L (ref 18–29)
CREATININE: 7.64 mg/dL — AB (ref 0.57–1.00)
Calcium: 9.4 mg/dL (ref 8.7–10.3)
GFR calc Af Amer: 5 mL/min/{1.73_m2} — ABNORMAL LOW (ref >59)
GFR calc non Af Amer: 4 mL/min/{1.73_m2} — ABNORMAL LOW (ref >59)
Globulin, Total: 4.7 g/dL — ABNORMAL HIGH (ref 1.5–4.5)
Glucose: 309 mg/dL — ABNORMAL HIGH (ref 65–99)
Potassium: 4.5 mmol/L (ref 3.5–5.2)
Sodium: 141 mmol/L (ref 134–144)
Total Protein: 8.4 g/dL (ref 6.0–8.5)

## 2016-11-25 DIAGNOSIS — M7060 Trochanteric bursitis, unspecified hip: Secondary | ICD-10-CM | POA: Diagnosis not present

## 2016-11-26 DIAGNOSIS — N2581 Secondary hyperparathyroidism of renal origin: Secondary | ICD-10-CM | POA: Diagnosis not present

## 2016-11-26 DIAGNOSIS — N186 End stage renal disease: Secondary | ICD-10-CM | POA: Diagnosis not present

## 2016-11-26 DIAGNOSIS — M7060 Trochanteric bursitis, unspecified hip: Secondary | ICD-10-CM | POA: Diagnosis not present

## 2016-11-27 ENCOUNTER — Other Ambulatory Visit: Payer: Self-pay | Admitting: Family Medicine

## 2016-11-27 DIAGNOSIS — M7060 Trochanteric bursitis, unspecified hip: Secondary | ICD-10-CM | POA: Diagnosis not present

## 2016-11-28 DIAGNOSIS — M7060 Trochanteric bursitis, unspecified hip: Secondary | ICD-10-CM | POA: Diagnosis not present

## 2016-11-28 DIAGNOSIS — N186 End stage renal disease: Secondary | ICD-10-CM | POA: Diagnosis not present

## 2016-11-28 DIAGNOSIS — N2581 Secondary hyperparathyroidism of renal origin: Secondary | ICD-10-CM | POA: Diagnosis not present

## 2016-11-29 DIAGNOSIS — M7060 Trochanteric bursitis, unspecified hip: Secondary | ICD-10-CM | POA: Diagnosis not present

## 2016-11-30 DIAGNOSIS — M7060 Trochanteric bursitis, unspecified hip: Secondary | ICD-10-CM | POA: Diagnosis not present

## 2016-12-01 DIAGNOSIS — M7060 Trochanteric bursitis, unspecified hip: Secondary | ICD-10-CM | POA: Diagnosis not present

## 2016-12-01 DIAGNOSIS — N2581 Secondary hyperparathyroidism of renal origin: Secondary | ICD-10-CM | POA: Diagnosis not present

## 2016-12-01 DIAGNOSIS — N186 End stage renal disease: Secondary | ICD-10-CM | POA: Diagnosis not present

## 2016-12-02 DIAGNOSIS — M7060 Trochanteric bursitis, unspecified hip: Secondary | ICD-10-CM | POA: Diagnosis not present

## 2016-12-03 DIAGNOSIS — N2581 Secondary hyperparathyroidism of renal origin: Secondary | ICD-10-CM | POA: Diagnosis not present

## 2016-12-03 DIAGNOSIS — M7060 Trochanteric bursitis, unspecified hip: Secondary | ICD-10-CM | POA: Diagnosis not present

## 2016-12-03 DIAGNOSIS — N186 End stage renal disease: Secondary | ICD-10-CM | POA: Diagnosis not present

## 2016-12-04 DIAGNOSIS — M7060 Trochanteric bursitis, unspecified hip: Secondary | ICD-10-CM | POA: Diagnosis not present

## 2016-12-05 DIAGNOSIS — N2581 Secondary hyperparathyroidism of renal origin: Secondary | ICD-10-CM | POA: Diagnosis not present

## 2016-12-05 DIAGNOSIS — M7060 Trochanteric bursitis, unspecified hip: Secondary | ICD-10-CM | POA: Diagnosis not present

## 2016-12-05 DIAGNOSIS — N186 End stage renal disease: Secondary | ICD-10-CM | POA: Diagnosis not present

## 2016-12-06 DIAGNOSIS — M7060 Trochanteric bursitis, unspecified hip: Secondary | ICD-10-CM | POA: Diagnosis not present

## 2016-12-07 DIAGNOSIS — M7060 Trochanteric bursitis, unspecified hip: Secondary | ICD-10-CM | POA: Diagnosis not present

## 2016-12-08 DIAGNOSIS — N186 End stage renal disease: Secondary | ICD-10-CM | POA: Diagnosis not present

## 2016-12-08 DIAGNOSIS — N2581 Secondary hyperparathyroidism of renal origin: Secondary | ICD-10-CM | POA: Diagnosis not present

## 2016-12-09 DIAGNOSIS — M7060 Trochanteric bursitis, unspecified hip: Secondary | ICD-10-CM | POA: Diagnosis not present

## 2016-12-10 DIAGNOSIS — N2581 Secondary hyperparathyroidism of renal origin: Secondary | ICD-10-CM | POA: Diagnosis not present

## 2016-12-10 DIAGNOSIS — N186 End stage renal disease: Secondary | ICD-10-CM | POA: Diagnosis not present

## 2016-12-10 DIAGNOSIS — M7060 Trochanteric bursitis, unspecified hip: Secondary | ICD-10-CM | POA: Diagnosis not present

## 2016-12-11 DIAGNOSIS — M7060 Trochanteric bursitis, unspecified hip: Secondary | ICD-10-CM | POA: Diagnosis not present

## 2016-12-12 DIAGNOSIS — N2581 Secondary hyperparathyroidism of renal origin: Secondary | ICD-10-CM | POA: Diagnosis not present

## 2016-12-12 DIAGNOSIS — M7060 Trochanteric bursitis, unspecified hip: Secondary | ICD-10-CM | POA: Diagnosis not present

## 2016-12-12 DIAGNOSIS — N186 End stage renal disease: Secondary | ICD-10-CM | POA: Diagnosis not present

## 2016-12-13 DIAGNOSIS — M7060 Trochanteric bursitis, unspecified hip: Secondary | ICD-10-CM | POA: Diagnosis not present

## 2016-12-14 DIAGNOSIS — M7060 Trochanteric bursitis, unspecified hip: Secondary | ICD-10-CM | POA: Diagnosis not present

## 2016-12-15 DIAGNOSIS — M7060 Trochanteric bursitis, unspecified hip: Secondary | ICD-10-CM | POA: Diagnosis not present

## 2016-12-15 DIAGNOSIS — N186 End stage renal disease: Secondary | ICD-10-CM | POA: Diagnosis not present

## 2016-12-15 DIAGNOSIS — N2581 Secondary hyperparathyroidism of renal origin: Secondary | ICD-10-CM | POA: Diagnosis not present

## 2016-12-16 DIAGNOSIS — M7060 Trochanteric bursitis, unspecified hip: Secondary | ICD-10-CM | POA: Diagnosis not present

## 2016-12-17 DIAGNOSIS — N186 End stage renal disease: Secondary | ICD-10-CM | POA: Diagnosis not present

## 2016-12-17 DIAGNOSIS — N2581 Secondary hyperparathyroidism of renal origin: Secondary | ICD-10-CM | POA: Diagnosis not present

## 2016-12-17 DIAGNOSIS — M7060 Trochanteric bursitis, unspecified hip: Secondary | ICD-10-CM | POA: Diagnosis not present

## 2016-12-19 DIAGNOSIS — M7060 Trochanteric bursitis, unspecified hip: Secondary | ICD-10-CM | POA: Diagnosis not present

## 2016-12-19 DIAGNOSIS — N186 End stage renal disease: Secondary | ICD-10-CM | POA: Diagnosis not present

## 2016-12-19 DIAGNOSIS — N2581 Secondary hyperparathyroidism of renal origin: Secondary | ICD-10-CM | POA: Diagnosis not present

## 2016-12-20 DIAGNOSIS — M7060 Trochanteric bursitis, unspecified hip: Secondary | ICD-10-CM | POA: Diagnosis not present

## 2016-12-21 DIAGNOSIS — N186 End stage renal disease: Secondary | ICD-10-CM | POA: Diagnosis not present

## 2016-12-21 DIAGNOSIS — Z992 Dependence on renal dialysis: Secondary | ICD-10-CM | POA: Diagnosis not present

## 2016-12-21 DIAGNOSIS — E1122 Type 2 diabetes mellitus with diabetic chronic kidney disease: Secondary | ICD-10-CM | POA: Diagnosis not present

## 2016-12-21 DIAGNOSIS — M7060 Trochanteric bursitis, unspecified hip: Secondary | ICD-10-CM | POA: Diagnosis not present

## 2016-12-22 ENCOUNTER — Other Ambulatory Visit: Payer: Self-pay | Admitting: Family Medicine

## 2016-12-22 DIAGNOSIS — N2581 Secondary hyperparathyroidism of renal origin: Secondary | ICD-10-CM | POA: Diagnosis not present

## 2016-12-22 DIAGNOSIS — Z992 Dependence on renal dialysis: Secondary | ICD-10-CM | POA: Diagnosis not present

## 2016-12-22 DIAGNOSIS — N186 End stage renal disease: Secondary | ICD-10-CM | POA: Diagnosis not present

## 2016-12-22 DIAGNOSIS — D689 Coagulation defect, unspecified: Secondary | ICD-10-CM | POA: Diagnosis not present

## 2016-12-22 DIAGNOSIS — E1122 Type 2 diabetes mellitus with diabetic chronic kidney disease: Secondary | ICD-10-CM | POA: Diagnosis not present

## 2016-12-22 DIAGNOSIS — M7060 Trochanteric bursitis, unspecified hip: Secondary | ICD-10-CM | POA: Diagnosis not present

## 2016-12-23 DIAGNOSIS — M7060 Trochanteric bursitis, unspecified hip: Secondary | ICD-10-CM | POA: Diagnosis not present

## 2016-12-24 DIAGNOSIS — N2581 Secondary hyperparathyroidism of renal origin: Secondary | ICD-10-CM | POA: Diagnosis not present

## 2016-12-24 DIAGNOSIS — D689 Coagulation defect, unspecified: Secondary | ICD-10-CM | POA: Diagnosis not present

## 2016-12-24 DIAGNOSIS — M7060 Trochanteric bursitis, unspecified hip: Secondary | ICD-10-CM | POA: Diagnosis not present

## 2016-12-24 DIAGNOSIS — N186 End stage renal disease: Secondary | ICD-10-CM | POA: Diagnosis not present

## 2016-12-25 ENCOUNTER — Encounter: Payer: Self-pay | Admitting: Internal Medicine

## 2016-12-25 ENCOUNTER — Ambulatory Visit (INDEPENDENT_AMBULATORY_CARE_PROVIDER_SITE_OTHER): Payer: Medicare HMO | Admitting: Internal Medicine

## 2016-12-25 VITALS — BP 110/58 | HR 87 | Temp 98.6°F | Wt 147.0 lb

## 2016-12-25 DIAGNOSIS — M79676 Pain in unspecified toe(s): Secondary | ICD-10-CM | POA: Diagnosis not present

## 2016-12-25 DIAGNOSIS — M7060 Trochanteric bursitis, unspecified hip: Secondary | ICD-10-CM | POA: Diagnosis not present

## 2016-12-25 NOTE — Progress Notes (Signed)
   [facility] Phone: [phone]  Subjective:  Claudia Rudddie is an 83 year old female presenting to same day clinic for right 5th toe pain. This has been going on for many months. Today, she was at HD and she told them that her 5th toe was hurting. The nurse told her that she needs to be seen by her doctor because she could have gout. Patient states she has never had gout before. The pain is located on the outside of her right 5th toe. The pain is worse when wearing shoes, especially when walking with shoes on. The pain is better when she takes her shoes off. She has not tried any medications for this. She denies any swelling or redness of the toe. She denies any skin changes. She denies any recent trauma to the toe. No fevers, no chills.  ROS: See HPI for pertinent positives and negatives  Past Medical History- HTN, CAD, chronic restrictive lung disease, T2DM, ESRD on HD, HLD  Family history reviewed for today's visit. No changes.  Social history- patient is a never smoker  Objective: BP (!) 110/58   Pulse 87   Temp 98.6 F (37 C) (Oral)   Wt 147 lb (66.7 kg)   BMI 23.73 kg/m  Gen: NAD, alert, cooperative with exam Msk: Right 5th toe without edema or erythema. Large callus present on the outer aspect of the toe. No tenderness to movement of toe. Normal ROM. No tenderness to palpation of the toe, but patient does have focal tenderness to palpation of the callus. No warmth appreciated. No skin changes. Remainder of foot is without edema, erythema, or gross deformity. No other lesions/ulcers noted on the foot. Foot is warm and well-perfused.  Assessment/Plan: Right 5th toe pain: Family concerned for gout of 5th toe, however patient has no pain with palpation and ROM of the toe. Also no erythema or warmth, so I think gout is highly unlikely. She does have focal tenderness over a callus present on the outside of her 5th toe. The pain is worse with wearing shoes. She is wearing  shoes that become more narrow at the toe, so I think her callus is rubbing when she walks, which is causing her pain. No other tenderness or signs of trauma that would indicate a fracture. Right foot is warm and well-perfused, so don't think her pain is caused decreased blood flow to her right lower extremity. - Recommend that patient use corn pads to help cushion the callus - Also recommended that patient wear wider shoes, which may help take pressure off the callus - Patient already has follow-up appointment with her podiatrist scheduled in a few weeks, so they will follow-up with the callus at that time - Return precautions discussed    Claudia CarolKaty Mayo, MD PGY-2

## 2016-12-25 NOTE — Patient Instructions (Signed)
It was so nice to meet you!  I think you have irritation of the callus on your little toe. I would recommend putting corn cushions on the area. She should follow-up with her foot doctor.    -Dr. Nancy MarusMayo

## 2016-12-26 DIAGNOSIS — M79676 Pain in unspecified toe(s): Secondary | ICD-10-CM | POA: Insufficient documentation

## 2016-12-26 DIAGNOSIS — N186 End stage renal disease: Secondary | ICD-10-CM | POA: Diagnosis not present

## 2016-12-26 DIAGNOSIS — N2581 Secondary hyperparathyroidism of renal origin: Secondary | ICD-10-CM | POA: Diagnosis not present

## 2016-12-26 DIAGNOSIS — M7060 Trochanteric bursitis, unspecified hip: Secondary | ICD-10-CM | POA: Diagnosis not present

## 2016-12-26 DIAGNOSIS — D689 Coagulation defect, unspecified: Secondary | ICD-10-CM | POA: Diagnosis not present

## 2016-12-26 NOTE — Assessment & Plan Note (Signed)
Family concerned for gout of 5th toe, however patient has no pain with palpation and ROM of the toe. Also no erythema or warmth, so I think gout is highly unlikely. She does have focal tenderness over a callus present on the outside of her 5th toe. The pain is worse with wearing shoes. She is wearing shoes that become more narrow at the toe, so I think her callus is rubbing when she walks, which is causing her pain. No other tenderness or signs of trauma that would indicate a fracture. Right foot is warm and well-perfused, so don't think her pain is caused decreased blood flow to her right lower extremity. - Recommend that patient use corn pads to help cushion the callus - Also recommended that patient wear wider shoes, which may help take pressure off the callus - Patient already has follow-up appointment with her podiatrist scheduled in a few weeks, so they will follow-up with the callus at that time - Return precautions discussed

## 2016-12-28 ENCOUNTER — Encounter (HOSPITAL_BASED_OUTPATIENT_CLINIC_OR_DEPARTMENT_OTHER): Payer: Medicare HMO | Attending: Internal Medicine

## 2016-12-28 DIAGNOSIS — L84 Corns and callosities: Secondary | ICD-10-CM | POA: Diagnosis not present

## 2016-12-28 DIAGNOSIS — Z992 Dependence on renal dialysis: Secondary | ICD-10-CM | POA: Diagnosis not present

## 2016-12-28 DIAGNOSIS — J449 Chronic obstructive pulmonary disease, unspecified: Secondary | ICD-10-CM | POA: Diagnosis not present

## 2016-12-28 DIAGNOSIS — F0390 Unspecified dementia without behavioral disturbance: Secondary | ICD-10-CM | POA: Diagnosis not present

## 2016-12-28 DIAGNOSIS — Z8673 Personal history of transient ischemic attack (TIA), and cerebral infarction without residual deficits: Secondary | ICD-10-CM | POA: Diagnosis not present

## 2016-12-28 DIAGNOSIS — L89312 Pressure ulcer of right buttock, stage 2: Secondary | ICD-10-CM | POA: Diagnosis not present

## 2016-12-28 DIAGNOSIS — I120 Hypertensive chronic kidney disease with stage 5 chronic kidney disease or end stage renal disease: Secondary | ICD-10-CM | POA: Insufficient documentation

## 2016-12-28 DIAGNOSIS — M109 Gout, unspecified: Secondary | ICD-10-CM | POA: Insufficient documentation

## 2016-12-28 DIAGNOSIS — I10 Essential (primary) hypertension: Secondary | ICD-10-CM | POA: Diagnosis not present

## 2016-12-28 DIAGNOSIS — I252 Old myocardial infarction: Secondary | ICD-10-CM | POA: Diagnosis not present

## 2016-12-28 DIAGNOSIS — N186 End stage renal disease: Secondary | ICD-10-CM | POA: Diagnosis not present

## 2016-12-28 DIAGNOSIS — E1122 Type 2 diabetes mellitus with diabetic chronic kidney disease: Secondary | ICD-10-CM | POA: Insufficient documentation

## 2016-12-28 DIAGNOSIS — I2510 Atherosclerotic heart disease of native coronary artery without angina pectoris: Secondary | ICD-10-CM | POA: Insufficient documentation

## 2016-12-29 ENCOUNTER — Other Ambulatory Visit: Payer: Self-pay | Admitting: Family Medicine

## 2016-12-29 DIAGNOSIS — N2581 Secondary hyperparathyroidism of renal origin: Secondary | ICD-10-CM | POA: Diagnosis not present

## 2016-12-29 DIAGNOSIS — N186 End stage renal disease: Secondary | ICD-10-CM | POA: Diagnosis not present

## 2016-12-29 DIAGNOSIS — D689 Coagulation defect, unspecified: Secondary | ICD-10-CM | POA: Diagnosis not present

## 2016-12-31 DIAGNOSIS — M7060 Trochanteric bursitis, unspecified hip: Secondary | ICD-10-CM | POA: Diagnosis not present

## 2016-12-31 DIAGNOSIS — D689 Coagulation defect, unspecified: Secondary | ICD-10-CM | POA: Diagnosis not present

## 2016-12-31 DIAGNOSIS — N2581 Secondary hyperparathyroidism of renal origin: Secondary | ICD-10-CM | POA: Diagnosis not present

## 2016-12-31 DIAGNOSIS — N186 End stage renal disease: Secondary | ICD-10-CM | POA: Diagnosis not present

## 2017-01-02 DIAGNOSIS — N186 End stage renal disease: Secondary | ICD-10-CM | POA: Diagnosis not present

## 2017-01-02 DIAGNOSIS — N2581 Secondary hyperparathyroidism of renal origin: Secondary | ICD-10-CM | POA: Diagnosis not present

## 2017-01-02 DIAGNOSIS — M7060 Trochanteric bursitis, unspecified hip: Secondary | ICD-10-CM | POA: Diagnosis not present

## 2017-01-02 DIAGNOSIS — D689 Coagulation defect, unspecified: Secondary | ICD-10-CM | POA: Diagnosis not present

## 2017-01-03 ENCOUNTER — Encounter (HOSPITAL_COMMUNITY): Payer: Self-pay | Admitting: Emergency Medicine

## 2017-01-03 ENCOUNTER — Inpatient Hospital Stay (HOSPITAL_COMMUNITY)
Admission: EM | Admit: 2017-01-03 | Discharge: 2017-01-16 | DRG: 193 | Disposition: A | Discharge status: Hospice | Payer: Medicare HMO | Attending: Family Medicine | Admitting: Family Medicine

## 2017-01-03 ENCOUNTER — Other Ambulatory Visit: Payer: Self-pay

## 2017-01-03 ENCOUNTER — Emergency Department (HOSPITAL_COMMUNITY): Payer: Medicare HMO

## 2017-01-03 DIAGNOSIS — M2550 Pain in unspecified joint: Secondary | ICD-10-CM | POA: Diagnosis not present

## 2017-01-03 DIAGNOSIS — D631 Anemia in chronic kidney disease: Secondary | ICD-10-CM | POA: Diagnosis present

## 2017-01-03 DIAGNOSIS — J189 Pneumonia, unspecified organism: Secondary | ICD-10-CM | POA: Diagnosis not present

## 2017-01-03 DIAGNOSIS — B370 Candidal stomatitis: Secondary | ICD-10-CM | POA: Diagnosis not present

## 2017-01-03 DIAGNOSIS — I272 Pulmonary hypertension, unspecified: Secondary | ICD-10-CM | POA: Diagnosis not present

## 2017-01-03 DIAGNOSIS — W19XXXA Unspecified fall, initial encounter: Secondary | ICD-10-CM | POA: Diagnosis present

## 2017-01-03 DIAGNOSIS — R531 Weakness: Secondary | ICD-10-CM

## 2017-01-03 DIAGNOSIS — F419 Anxiety disorder, unspecified: Secondary | ICD-10-CM | POA: Diagnosis present

## 2017-01-03 DIAGNOSIS — I959 Hypotension, unspecified: Secondary | ICD-10-CM | POA: Diagnosis not present

## 2017-01-03 DIAGNOSIS — Z992 Dependence on renal dialysis: Secondary | ICD-10-CM

## 2017-01-03 DIAGNOSIS — Z823 Family history of stroke: Secondary | ICD-10-CM

## 2017-01-03 DIAGNOSIS — Z66 Do not resuscitate: Secondary | ICD-10-CM | POA: Diagnosis not present

## 2017-01-03 DIAGNOSIS — I120 Hypertensive chronic kidney disease with stage 5 chronic kidney disease or end stage renal disease: Secondary | ICD-10-CM | POA: Diagnosis not present

## 2017-01-03 DIAGNOSIS — Z8673 Personal history of transient ischemic attack (TIA), and cerebral infarction without residual deficits: Secondary | ICD-10-CM

## 2017-01-03 DIAGNOSIS — F0390 Unspecified dementia without behavioral disturbance: Secondary | ICD-10-CM | POA: Diagnosis present

## 2017-01-03 DIAGNOSIS — L89312 Pressure ulcer of right buttock, stage 2: Secondary | ICD-10-CM | POA: Diagnosis present

## 2017-01-03 DIAGNOSIS — I2510 Atherosclerotic heart disease of native coronary artery without angina pectoris: Secondary | ICD-10-CM | POA: Diagnosis present

## 2017-01-03 DIAGNOSIS — F0391 Unspecified dementia with behavioral disturbance: Secondary | ICD-10-CM | POA: Diagnosis not present

## 2017-01-03 DIAGNOSIS — J9601 Acute respiratory failure with hypoxia: Secondary | ICD-10-CM | POA: Diagnosis present

## 2017-01-03 DIAGNOSIS — G301 Alzheimer's disease with late onset: Secondary | ICD-10-CM | POA: Diagnosis not present

## 2017-01-03 DIAGNOSIS — E876 Hypokalemia: Secondary | ICD-10-CM | POA: Diagnosis present

## 2017-01-03 DIAGNOSIS — E1122 Type 2 diabetes mellitus with diabetic chronic kidney disease: Secondary | ICD-10-CM | POA: Diagnosis not present

## 2017-01-03 DIAGNOSIS — N186 End stage renal disease: Secondary | ICD-10-CM | POA: Diagnosis not present

## 2017-01-03 DIAGNOSIS — E785 Hyperlipidemia, unspecified: Secondary | ICD-10-CM | POA: Diagnosis present

## 2017-01-03 DIAGNOSIS — Z7401 Bed confinement status: Secondary | ICD-10-CM | POA: Diagnosis not present

## 2017-01-03 DIAGNOSIS — E1151 Type 2 diabetes mellitus with diabetic peripheral angiopathy without gangrene: Secondary | ICD-10-CM | POA: Diagnosis present

## 2017-01-03 DIAGNOSIS — Z888 Allergy status to other drugs, medicaments and biological substances status: Secondary | ICD-10-CM

## 2017-01-03 DIAGNOSIS — R2689 Other abnormalities of gait and mobility: Secondary | ICD-10-CM | POA: Diagnosis present

## 2017-01-03 DIAGNOSIS — R0602 Shortness of breath: Secondary | ICD-10-CM

## 2017-01-03 DIAGNOSIS — N2581 Secondary hyperparathyroidism of renal origin: Secondary | ICD-10-CM | POA: Diagnosis present

## 2017-01-03 DIAGNOSIS — R4701 Aphasia: Secondary | ICD-10-CM

## 2017-01-03 DIAGNOSIS — G8929 Other chronic pain: Secondary | ICD-10-CM | POA: Diagnosis present

## 2017-01-03 DIAGNOSIS — L299 Pruritus, unspecified: Secondary | ICD-10-CM | POA: Diagnosis not present

## 2017-01-03 DIAGNOSIS — Z7982 Long term (current) use of aspirin: Secondary | ICD-10-CM

## 2017-01-03 DIAGNOSIS — J470 Bronchiectasis with acute lower respiratory infection: Secondary | ICD-10-CM | POA: Diagnosis not present

## 2017-01-03 DIAGNOSIS — M549 Dorsalgia, unspecified: Secondary | ICD-10-CM | POA: Diagnosis present

## 2017-01-03 DIAGNOSIS — Z09 Encounter for follow-up examination after completed treatment for conditions other than malignant neoplasm: Secondary | ICD-10-CM

## 2017-01-03 DIAGNOSIS — R0603 Acute respiratory distress: Secondary | ICD-10-CM | POA: Diagnosis not present

## 2017-01-03 DIAGNOSIS — F05 Delirium due to known physiological condition: Secondary | ICD-10-CM | POA: Diagnosis present

## 2017-01-03 DIAGNOSIS — E119 Type 2 diabetes mellitus without complications: Secondary | ICD-10-CM

## 2017-01-03 DIAGNOSIS — Z9889 Other specified postprocedural states: Secondary | ICD-10-CM

## 2017-01-03 DIAGNOSIS — R4182 Altered mental status, unspecified: Secondary | ICD-10-CM | POA: Diagnosis not present

## 2017-01-03 DIAGNOSIS — F0280 Dementia in other diseases classified elsewhere without behavioral disturbance: Secondary | ICD-10-CM | POA: Diagnosis not present

## 2017-01-03 DIAGNOSIS — E8889 Other specified metabolic disorders: Secondary | ICD-10-CM | POA: Diagnosis present

## 2017-01-03 DIAGNOSIS — I10 Essential (primary) hypertension: Secondary | ICD-10-CM | POA: Diagnosis not present

## 2017-01-03 DIAGNOSIS — Z79899 Other long term (current) drug therapy: Secondary | ICD-10-CM

## 2017-01-03 DIAGNOSIS — I252 Old myocardial infarction: Secondary | ICD-10-CM | POA: Diagnosis not present

## 2017-01-03 DIAGNOSIS — R091 Pleurisy: Secondary | ICD-10-CM | POA: Diagnosis not present

## 2017-01-03 DIAGNOSIS — Z86010 Personal history of colonic polyps: Secondary | ICD-10-CM

## 2017-01-03 DIAGNOSIS — Z8711 Personal history of peptic ulcer disease: Secondary | ICD-10-CM

## 2017-01-03 DIAGNOSIS — R079 Chest pain, unspecified: Secondary | ICD-10-CM | POA: Diagnosis not present

## 2017-01-03 DIAGNOSIS — Y95 Nosocomial condition: Secondary | ICD-10-CM | POA: Diagnosis present

## 2017-01-03 DIAGNOSIS — Z515 Encounter for palliative care: Secondary | ICD-10-CM

## 2017-01-03 DIAGNOSIS — I871 Compression of vein: Secondary | ICD-10-CM | POA: Diagnosis not present

## 2017-01-03 DIAGNOSIS — H409 Unspecified glaucoma: Secondary | ICD-10-CM | POA: Diagnosis present

## 2017-01-03 DIAGNOSIS — Z7984 Long term (current) use of oral hypoglycemic drugs: Secondary | ICD-10-CM

## 2017-01-03 DIAGNOSIS — Z7189 Other specified counseling: Secondary | ICD-10-CM

## 2017-01-03 DIAGNOSIS — J984 Other disorders of lung: Secondary | ICD-10-CM

## 2017-01-03 DIAGNOSIS — R000 Tachycardia, unspecified: Secondary | ICD-10-CM | POA: Diagnosis present

## 2017-01-03 DIAGNOSIS — R627 Adult failure to thrive: Secondary | ICD-10-CM

## 2017-01-03 DIAGNOSIS — K219 Gastro-esophageal reflux disease without esophagitis: Secondary | ICD-10-CM | POA: Diagnosis present

## 2017-01-03 DIAGNOSIS — L8990 Pressure ulcer of unspecified site, unspecified stage: Secondary | ICD-10-CM | POA: Insufficient documentation

## 2017-01-03 DIAGNOSIS — R0902 Hypoxemia: Secondary | ICD-10-CM

## 2017-01-03 DIAGNOSIS — E1129 Type 2 diabetes mellitus with other diabetic kidney complication: Secondary | ICD-10-CM | POA: Diagnosis not present

## 2017-01-03 DIAGNOSIS — Z90710 Acquired absence of both cervix and uterus: Secondary | ICD-10-CM

## 2017-01-03 DIAGNOSIS — J90 Pleural effusion, not elsewhere classified: Secondary | ICD-10-CM | POA: Diagnosis present

## 2017-01-03 DIAGNOSIS — Z885 Allergy status to narcotic agent status: Secondary | ICD-10-CM

## 2017-01-03 DIAGNOSIS — Z8249 Family history of ischemic heart disease and other diseases of the circulatory system: Secondary | ICD-10-CM

## 2017-01-03 DIAGNOSIS — R0600 Dyspnea, unspecified: Secondary | ICD-10-CM

## 2017-01-03 DIAGNOSIS — J181 Lobar pneumonia, unspecified organism: Secondary | ICD-10-CM | POA: Diagnosis not present

## 2017-01-03 LAB — CBC
HCT: 27.8 % — ABNORMAL LOW (ref 36.0–46.0)
Hemoglobin: 8.8 g/dL — ABNORMAL LOW (ref 12.0–15.0)
MCH: 31.5 pg (ref 26.0–34.0)
MCHC: 31.7 g/dL (ref 30.0–36.0)
MCV: 99.6 fL (ref 78.0–100.0)
PLATELETS: 201 10*3/uL (ref 150–400)
RBC: 2.79 MIL/uL — AB (ref 3.87–5.11)
RDW: 16.7 % — AB (ref 11.5–15.5)
WBC: 14.1 10*3/uL — AB (ref 4.0–10.5)

## 2017-01-03 LAB — PROCALCITONIN: Procalcitonin: 1.52 ng/mL

## 2017-01-03 LAB — BASIC METABOLIC PANEL
Anion gap: 12 (ref 5–15)
BUN: 25 mg/dL — ABNORMAL HIGH (ref 6–20)
CALCIUM: 9.1 mg/dL (ref 8.9–10.3)
CHLORIDE: 93 mmol/L — AB (ref 101–111)
CO2: 28 mmol/L (ref 22–32)
CREATININE: 6.01 mg/dL — AB (ref 0.44–1.00)
GFR calc non Af Amer: 6 mL/min — ABNORMAL LOW (ref >60)
GFR, EST AFRICAN AMERICAN: 7 mL/min — AB (ref >60)
Glucose, Bld: 232 mg/dL — ABNORMAL HIGH (ref 65–99)
Potassium: 2.7 mmol/L — CL (ref 3.5–5.1)
SODIUM: 133 mmol/L — AB (ref 135–145)

## 2017-01-03 LAB — MAGNESIUM: Magnesium: 1.7 mg/dL (ref 1.7–2.4)

## 2017-01-03 LAB — I-STAT TROPONIN, ED: TROPONIN I, POC: 0 ng/mL (ref 0.00–0.08)

## 2017-01-03 LAB — BRAIN NATRIURETIC PEPTIDE: B Natriuretic Peptide: 110.3 pg/mL — ABNORMAL HIGH (ref 0.0–100.0)

## 2017-01-03 LAB — GLUCOSE, CAPILLARY: Glucose-Capillary: 203 mg/dL — ABNORMAL HIGH (ref 65–99)

## 2017-01-03 LAB — TROPONIN I: Troponin I: 0.03 ng/mL (ref <0.03)

## 2017-01-03 MED ORDER — LORATADINE 10 MG PO TABS
10.0000 mg | ORAL_TABLET | Freq: Every day | ORAL | Status: DC
  Administered 2017-01-04 – 2017-01-16 (×11): 10 mg via ORAL
  Filled 2017-01-03 (×12): qty 1

## 2017-01-03 MED ORDER — AMLODIPINE BESYLATE 10 MG PO TABS
10.0000 mg | ORAL_TABLET | Freq: Every evening | ORAL | Status: DC
  Administered 2017-01-03 – 2017-01-15 (×11): 10 mg via ORAL
  Filled 2017-01-03 (×2): qty 2
  Filled 2017-01-03 (×8): qty 1
  Filled 2017-01-03: qty 2
  Filled 2017-01-03 (×2): qty 1

## 2017-01-03 MED ORDER — SODIUM CHLORIDE 0.9% FLUSH
3.0000 mL | Freq: Two times a day (BID) | INTRAVENOUS | Status: DC
  Administered 2017-01-03 – 2017-01-16 (×21): 3 mL via INTRAVENOUS

## 2017-01-03 MED ORDER — RENAL MULTIVITAMIN FORMULA PO TABS: ORAL_TABLET | Freq: Every evening | ORAL | Status: DC

## 2017-01-03 MED ORDER — ALBUTEROL SULFATE (2.5 MG/3ML) 0.083% IN NEBU
2.5000 mg | INHALATION_SOLUTION | RESPIRATORY_TRACT | Status: DC | PRN
  Administered 2017-01-04 – 2017-01-15 (×7): 2.5 mg via RESPIRATORY_TRACT
  Filled 2017-01-03 (×7): qty 3

## 2017-01-03 MED ORDER — RENA-VITE PO TABS
1.0000 | ORAL_TABLET | Freq: Every day | ORAL | Status: DC
  Administered 2017-01-03 – 2017-01-14 (×9): 1 via ORAL
  Filled 2017-01-03 (×10): qty 1

## 2017-01-03 MED ORDER — ALBUTEROL SULFATE (2.5 MG/3ML) 0.083% IN NEBU
2.5000 mg | INHALATION_SOLUTION | Freq: Once | RESPIRATORY_TRACT | Status: AC
  Administered 2017-01-03: 2.5 mg via RESPIRATORY_TRACT
  Filled 2017-01-03: qty 3

## 2017-01-03 MED ORDER — DEXTROSE 5 % IV SOLN: 1.0000 g | Freq: Once | INTRAVENOUS | Status: DC

## 2017-01-03 MED ORDER — ASPIRIN 81 MG PO CHEW
81.0000 mg | CHEWABLE_TABLET | Freq: Every morning | ORAL | Status: DC
  Administered 2017-01-04 – 2017-01-15 (×10): 81 mg via ORAL
  Filled 2017-01-03 (×11): qty 1

## 2017-01-03 MED ORDER — POTASSIUM CHLORIDE 10 MEQ/100ML IV SOLN
10.0000 meq | Freq: Once | INTRAVENOUS | Status: AC
  Administered 2017-01-03: 10 meq via INTRAVENOUS
  Filled 2017-01-03: qty 100

## 2017-01-03 MED ORDER — ATORVASTATIN CALCIUM 80 MG PO TABS
80.0000 mg | ORAL_TABLET | Freq: Every day | ORAL | Status: DC
  Administered 2017-01-03 – 2017-01-14 (×9): 80 mg via ORAL
  Filled 2017-01-03 (×10): qty 1

## 2017-01-03 MED ORDER — ENOXAPARIN SODIUM 30 MG/0.3ML ~~LOC~~ SOLN
30.0000 mg | SUBCUTANEOUS | Status: DC
  Administered 2017-01-03 – 2017-01-04 (×2): 30 mg via SUBCUTANEOUS
  Filled 2017-01-03 (×3): qty 0.3

## 2017-01-03 MED ORDER — DEXTROSE 5 % IV SOLN: 500.0000 mg | Freq: Once | INTRAVENOUS | Status: DC

## 2017-01-03 MED ORDER — INSULIN ASPART 100 UNIT/ML ~~LOC~~ SOLN
0.0000 [IU] | Freq: Three times a day (TID) | SUBCUTANEOUS | Status: DC
  Administered 2017-01-04: 1 [IU] via SUBCUTANEOUS
  Administered 2017-01-04 (×2): 5 [IU] via SUBCUTANEOUS
  Administered 2017-01-05 (×2): 2 [IU] via SUBCUTANEOUS
  Administered 2017-01-06: 5 [IU] via SUBCUTANEOUS
  Administered 2017-01-06: 2 [IU] via SUBCUTANEOUS
  Administered 2017-01-07: 3 [IU] via SUBCUTANEOUS
  Administered 2017-01-07: 1 [IU] via SUBCUTANEOUS
  Administered 2017-01-08: 2 [IU] via SUBCUTANEOUS
  Administered 2017-01-08 – 2017-01-09 (×4): 7 [IU] via SUBCUTANEOUS
  Administered 2017-01-10: 5 [IU] via SUBCUTANEOUS
  Administered 2017-01-10 (×2): 7 [IU] via SUBCUTANEOUS
  Administered 2017-01-11: 9 [IU] via SUBCUTANEOUS
  Administered 2017-01-11: 5 [IU] via SUBCUTANEOUS
  Administered 2017-01-11: 7 [IU] via SUBCUTANEOUS
  Administered 2017-01-12: 5 [IU] via SUBCUTANEOUS
  Administered 2017-01-12: 3 [IU] via SUBCUTANEOUS
  Administered 2017-01-13 (×2): 7 [IU] via SUBCUTANEOUS
  Administered 2017-01-13: 3 [IU] via SUBCUTANEOUS
  Administered 2017-01-14: 5 [IU] via SUBCUTANEOUS

## 2017-01-03 MED ORDER — DOCUSATE SODIUM 100 MG PO CAPS: 100.0000 mg | ORAL_CAPSULE | Freq: Every day | ORAL | Status: DC | PRN

## 2017-01-03 MED ORDER — METOPROLOL TARTRATE 25 MG PO TABS
25.0000 mg | ORAL_TABLET | Freq: Two times a day (BID) | ORAL | Status: DC
  Administered 2017-01-03 – 2017-01-04 (×3): 25 mg via ORAL
  Filled 2017-01-03 (×4): qty 1

## 2017-01-03 MED ORDER — ALBUTEROL SULFATE HFA 108 (90 BASE) MCG/ACT IN AERS: 2.0000 | INHALATION_SPRAY | RESPIRATORY_TRACT | Status: DC | PRN

## 2017-01-03 MED ORDER — VANCOMYCIN HCL 10 G IV SOLR
1500.0000 mg | Freq: Once | INTRAVENOUS | Status: AC
  Administered 2017-01-03: 1500 mg via INTRAVENOUS
  Filled 2017-01-03: qty 1500

## 2017-01-03 MED ORDER — ACETAMINOPHEN 650 MG RE SUPP
650.0000 mg | Freq: Three times a day (TID) | RECTAL | Status: DC | PRN
  Administered 2017-01-07: 650 mg via RECTAL
  Filled 2017-01-03: qty 1

## 2017-01-03 MED ORDER — ACETAMINOPHEN 325 MG PO TABS
650.0000 mg | ORAL_TABLET | Freq: Three times a day (TID) | ORAL | Status: DC | PRN
  Administered 2017-01-07 – 2017-01-11 (×6): 650 mg via ORAL
  Filled 2017-01-03 (×6): qty 2

## 2017-01-03 MED ORDER — PANTOPRAZOLE SODIUM 40 MG PO TBEC
40.0000 mg | DELAYED_RELEASE_TABLET | Freq: Every day | ORAL | Status: DC
  Administered 2017-01-04 – 2017-01-16 (×11): 40 mg via ORAL
  Filled 2017-01-03 (×12): qty 1

## 2017-01-03 MED ORDER — POLYETHYLENE GLYCOL 3350 17 G PO PACK: 17.0000 g | PACK | Freq: Every day | ORAL | Status: DC | PRN

## 2017-01-03 MED ORDER — DEXTROSE 5 % IV SOLN
1.0000 g | Freq: Once | INTRAVENOUS | Status: AC
  Administered 2017-01-03: 1 g via INTRAVENOUS
  Filled 2017-01-03: qty 1

## 2017-01-03 NOTE — ED Notes (Addendum)
Unable to obtain labs with IV start.  Still attempting to obtain blood work.

## 2017-01-03 NOTE — Progress Notes (Signed)
Pharmacy Antibiotic Note  Claudia Rich is a 83 y.o. female admitted on 01/03/2017 with AMS and SOB. Pt also noted to have a sore on sacram. Starting broad spectrum antibiotics for pneumonia. Pt is ESRD on HD.    Plan: -Vancomycin 1500 mg IV x1 then 750 mg IV qHD -Monitor HD schedule and tolerance -F/u culutres -VR as indicated    Weight: 147 lb (66.7 kg)  Temp (24hrs), Avg:98.6 F (37 C), Min:98.6 F (37 C), Max:98.6 F (37 C)   Recent Labs Lab 01/03/17 1719  WBC 14.1*  CREATININE 6.01*    Estimated Creatinine Clearance: 6.2 mL/min (A) (by C-G formula based on SCr of 6.01 mg/dL (H)).    Allergies  Allergen Reactions  . Codeine Sulfate Other (See Comments)    "It made me sick"  . Meperidine Hcl Nausea And Vomiting    REACTION: vomiting    Antimicrobials this admission: 5/13 cefepime x1 5/13 vancomycin >   Dose adjustments this admission: N/A   Microbiology results: 5/13 blood cx:   Claudia Rich, Claudia Rich 01/03/2017 7:18 PM

## 2017-01-03 NOTE — H&P (Signed)
Family Medicine Teaching [facility] Admission History and Physical Service Pager: [phone]  Patient name: Claudia Rich Medical record number: [id number] Date of birth: [birth year] Age: 83 y.o. Gender: female  Primary Care Provider: Araceli Bouche, DO Consultants: renal in am Code Status: FULL (for now, niece will discuss possible DNR going forward once acute illness resolved)  Chief Complaint: SOB, AMS  Assessment and Plan: Claudia Rich is a 83 y.o. female presenting with SOB, AMS . PMH is significant for ESRD on HD, dementia, CAD, T2DM, asthma.  # SOB/Hypoxia:  She is saturating in 90s on RA.  However, if she moves she desaturates.  No home O2 requirement.  VS otherwise stable. Afebrile. CXR with RLL opacity concerning for pna. WBC 14.1.  BNP 110.3.  iTrop neg. EKG with T wave changes.  However, I suspect this to be due to her hypokalemic state.  Working Dx is HCAP.  Doubt asthma exacerbation given lack of wheezes.  No pulmonary edema on CXR to suggest fluid overload.   - Admit to telemetry under Dr Jennette Kettle - VS per floor protocol - Vanc/ Cefepime per pharmacy (5/13>)   - f/u BCx obtained in ED. - CBC in am - O2 prn saturations <92%.  Will place on 2L O2 overnight. - f/u BCx obtained in ED - check sTrop  # AMS: described by family as "not as perky".  h/o dementia. Mental status change likely 2/2 acute infection but could be due to progression of dementia.  BUN 25. Had fall on Friday but per family did not hit her head.  AOx2 (did not know year/ president).  This is baseline for her per family.   - Consider CT head if develops neurologic deficits or does not improve with abx - PT/OT/SLP consult  # Right sided CP, chronic. She has h/o CAD/ HTN.  Has h/o right sided chest wall injury involving rib fractures 06/2012.  She had a negative Myoview in 05/2012.  She is on ASA, Norvasc, Lopresor, Lipitor 80mg  at home.  She is at increased risk of cardiac disease given ESRD on HD and DM2. Trop  neg. EKG with T wave changes, likely related to potassium status.  Doubt ischemia at this time. - obtain sTrop. - AM EKG  # Hypokalemia: K 2.7 in ED. s/p of KCL IV. - Check Mg - am EKG - c/s renal in am to assist with continued electrolyte management  # ESRD on HD: Tu,Th, Sat.  Electrolytes as above.  Occ produces small amounts of urine. - c/s renal in am - pharmacy to renally dose Vanc  # DM2:  11/2016 A1c 7.9. On Januvia at home. - sSSI while hospitalized - BG checks qAC   # Pressure wound: per POA is sacral wound.  This was not well visualized on exam. - c/s Wound RN  # GERD w/ h/o peptic ulcer: on Zantac 150mg  BID - will put on PPI, as this does not require renal dosing.   - If patient prefers to be on H2 blocker going forward, dose should be 1mg /kg/d of Zantac in setting of ESRD on HD.  FEN/GI: SLIV, HH/Carb mod renal diet, Protonix Prophylaxis: Lovenox subq renal dosed  Disposition: Admit to telemetry for IV abx and management of hypokalemia.  History of Present Illness:  Claudia Rich is a 83 y.o. female presenting with SOB, AMS  Her niece [name] is her POA, c. [phone].  She resides in [place].  She reports that Wednesday her BG was  a little low.  She reports that she was a little altered over the last week.  She seems not her normal self.  She is not as "perky" as normal, noting she is slower to respond and more tired than normal.  No hallucinations or inappropriate responses.  She reports a witnessed fall on Friday onto her bottom/ knees but did not hit her head.  No fevers at home but she reports that she keeps thermostat at home in the 90s.  No chills.  She has a chronic productive cough and congestion. ? Dx of COPD/ Asthma.  Never a smoker.  She is on inhalers at baseline.  She resides at home alone.  She has an aid that comes to help her at home for about 2 hours at a time.  Patient reports right sided CP today.  She reports occ SOB over last several days.   She reports chronic back pain.    No ETOH, tob, drugs.  At baseline she ambulates with a cane.  Review Of Systems: Per HPI with the following additions: none  ROS  Patient Active Problem List   Diagnosis Date Noted  . Pain of fifth toe 12/26/2016  . Chronic left hip pain 02/11/2016  . Trochanteric bursitis of left hip 01/17/2016  . Cutaneous horn 01/17/2016  . Balance problem 02/27/2015  . Preventative health care 12/14/2014  . DM2 (diabetes mellitus, type 2) (HCC) 12/14/2014  . Back pain 12/14/2014  . Hearing difficulty 10/10/2013  . Chronic restrictive lung disease 01/23/2013  . Pancreatic cyst 01/23/2013  . Dementia 07/02/2012  . CAD (coronary artery disease) 05/23/2012  . ESRD on dialysis (HCC) 05/07/2010  . Macrocytic anemia 03/20/2009  . Essential hypertension, benign 06/10/2007  . Hyperlipidemia 10/29/2006  . G E R D 10/29/2006    Past Medical History: Past Medical History:  Diagnosis Date  . Anemia   . Colon polyps   . COPD (chronic obstructive pulmonary disease) (HCC)   . Diabetes mellitus   . Diverticulosis   . ESRD on hemodialysis (HCC)    [facility]. Started HD   . GERD (gastroesophageal reflux disease)   . Glaucoma   . Hammer toe   . Hyperlipidemia   . Hypertension   . Internal hemorrhoids   . Myocardial infarction (HCC) 10/2010  . Peripheral vascular disease (HCC)   . Stroke [facility](HCC) 10/2010   Has some memory impairment per DTR    Past Surgical History: Past Surgical History:  Procedure Laterality Date  . ABDOMINAL HYSTERECTOMY    . AV FISTULA PLACEMENT    . AV FISTULA PLACEMENT  08/14/2011   Procedure: ARTERIOVENOUS (AV) FISTULA CREATION;  Surgeon: Chuck Hinthristopher S Dickson, MD;  [facility];  Service: Vascular;  Laterality: Right;  Right Arm Arteriovenous Fistula Creation/ contact pt's niece,[name] for any information to be relayed to pt: # [phone]  . AV FISTULA PLACEMENT  12/07/2011   Procedure: INSERTION OF ARTERIOVENOUS (AV)  GORE-TEX GRAFT ARM;  Surgeon: Sherren Kernsharles E Fields, MD;  Location: [facility] OR;  Service: Vascular;  Laterality: Left;  . AV FISTULA PLACEMENT, BRACHIOCEPHALIC  09/24/2010   Left  . AV FISTULA REPAIR  02/16/2011   Revision of left brachiocephalic arteriovenous fistula with patch angioplasty of left brachiocephalic fistula  . CATARACT EXTRACTION    . EYE SURGERY    . INSERTION OF DIALYSIS CATHETER  09/28/2011   Procedure: INSERTION OF DIALYSIS CATHETER;  Surgeon: Sherren Kernsharles E Fields, MD;  [facility];  Service: Vascular;  Laterality:  Left;  Insertion of Dialysis Catheter left IJ  . SHUNTOGRAM Right 09/28/2011   Procedure: SHUNTOGRAM;  Surgeon: Chuck Hint, MD;  Location: [facility] CATH LAB;  Service: Cardiovascular;  Laterality: Right;  . SHUNTOGRAM N/A 10/09/2011   Procedure: Betsey Amen;  Surgeon: Sherren Kerns, MD;  Location: [facility] CATH LAB;  Service: Cardiovascular;  Laterality: N/A;  . THROMBECTOMY AND REVISION OF ARTERIOVENTOUS (AV) GORETEX  GRAFT  06/28/2012   Procedure: THROMBECTOMY AND REVISION OF ARTERIOVENTOUS (AV) GORETEX  GRAFT;  Surgeon: Chuck Hint, MD;  Location: [facility] OR;  Service: Vascular;  Laterality: Left;    Social History: Social History  Substance Use Topics  . Smoking status: Never Smoker  . Smokeless tobacco: Never Used  . Alcohol use No   Additional social history: none  Please also refer to relevant sections of EMR.  Family History: Family History  Problem Relation Age of Onset  . Hypertension Sister   . Anesthesia problems Neg Hx     Allergies and Medications: Allergies  Allergen Reactions  . Codeine Sulfate Other (See Comments)    "It made me sick"  . Meperidine Hcl Nausea And Vomiting    REACTION: vomiting   No current facility-administered medications on file prior to encounter.    Current Outpatient Prescriptions on File Prior to Encounter  Medication Sig Dispense Refill  . acetaminophen (TYLENOL) 500 MG tablet Take 500 mg by mouth every 6 (six)  hours as needed for mild pain.    Marland Kitchen amLODipine (NORVASC) 10 MG tablet Take 1 tablet (10 mg total) by mouth every evening. 90 tablet 3  . aspirin 81 MG chewable tablet Chew 81 mg by mouth every morning.     Marland Kitchen atorvastatin (LIPITOR) 80 MG tablet TAKE ONE TABLET AT BEDTIME. 90 tablet 3  . B Complex-C-Folic Acid (RENAL MULTIVITAMIN FORMULA PO) Take 1 tablet by mouth every evening.     . calcium acetate (PHOSLO) 667 MG capsule Take 1,334 mg by mouth 2 (two) times daily.     . cephALEXin (KEFLEX) 500 MG capsule Take 1 capsule (500 mg total) by mouth daily. 5 capsule 0  . docusate sodium (COLACE) 100 MG capsule Take 100 mg by mouth 2 (two) times daily.    . fluticasone-salmeterol (ADVAIR HFA) 230-21 MCG/ACT inhaler Inhale 2 puffs into the lungs 2 (two) times daily. 1 Inhaler 12  . hydrocortisone cream-nystatin cream-zinc oxide Apply 1 application topically 2 (two) times daily. 1 Tube 3  . JANUVIA 25 MG tablet TAKE 1 TABLET ONCE DAILY. 90 tablet 3  . meloxicam (MOBIC) 7.5 MG tablet TAKE 1 TABLET DAILY. TAKE UP TO 2-4 WEEKS IF HELPING 30 tablet 0  . metoprolol tartrate (LOPRESSOR) 25 MG tablet TAKE 1 TABLET TWICE DAILY ON NON-DIALYSIS DAYS AND TAKE 1 TABLET DAILY ON DIALYSIS DAYS. 90 tablet 3  . QC LORATADINE ALLERGY RELIEF 10 MG tablet TAKE 1 TABLET ONCE DAILY. 90 tablet 3  . ranitidine (ZANTAC) 150 MG capsule Take 1 capsule (150 mg total) by mouth 2 (two) times daily. For stomach ulcer 60 capsule 2  . ranitidine (ZANTAC) 150 MG tablet TAKE 1 TABLET TWICE DAILY. 60 tablet 3  . tiotropium (SPIRIVA HANDIHALER) 18 MCG inhalation capsule Place 1 capsule (18 mcg total) into inhaler and inhale daily. (Patient taking differently: Place 18 mcg into inhaler and inhale every evening. ) 30 capsule 12  . traMADol (ULTRAM) 50 MG tablet Take 1 tablet (50 mg total) by mouth every 8 (eight) hours as needed. 60 tablet 0  .  VENTOLIN HFA 108 (90 Base) MCG/ACT inhaler USE 2 PUFFS ONCE A DAY AS NEEDED 18 g 0  . zoster vaccine  live, PF, (ZOSTAVAX) 16109 UNT/0.65ML injection Inject 19,400 Units into the skin once. (Patient not taking: Reported on 01/18/2015) 1 each 0  . zoster vaccine live, PF, (ZOSTAVAX) 60454 UNT/0.65ML injection Inject 19,400 Units into the skin once. (Patient not taking: Reported on 01/18/2015) 1 each 0    Objective: BP (!) 153/54   Pulse 95   Temp 98.6 F (37 C) (Oral)   Resp (!) 26   Wt 147 lb (66.7 kg)   SpO2 97%   BMI 23.73 kg/m  Exam: General: awake, alert, sitting up in bed, family at bedside Eyes: sclera white, arcus senilis appreciated, PERRL ENTM: MMM, o/p clear Neck: supple, no masses palpated, no JVD Cardiovascular: RRR, soft murmur appreciated Respiratory: lung exam technically difficult 2/2 shallow breathing, seemingly CTAB, normal WOB on room air Gastrointestinal: soft, NT/ ND, +BS MSK: no peripheral edema, WWP, +2 pedal pulses Derm: per ED note has superficial sacral ulcer w/out evidence of infection. Neuro: follows commands, AOx2, 4/5 LE and UE strength, light touch sensation grossly in tact Psych: mood stable, somewhat slow to respond but responds appropriately.  Labs and Imaging: CBC BMET   Recent Labs Lab 01/03/17 1719  WBC 14.1*  HGB 8.8*  HCT 27.8*  PLT 201    Recent Labs Lab 01/03/17 1719  NA 133*  K 2.7*  CL 93*  CO2 28  BUN 25*  CREATININE 6.01*  GLUCOSE 232*  CALCIUM 9.1     Dg Chest 2 View  Result Date: 01/03/2017 CLINICAL DATA:  Right chest pain, shortness of breath EXAM: CHEST  2 VIEW COMPARISON:  03/13/2014 FINDINGS: Moderate right pleural effusion. Associated right lower lobe opacity, likely compressive atelectasis. No frank interstitial edema.  No pneumothorax. The heart is normal in size. Vascular stent overlying the mediastinum. Old left rib fracture deformities. IMPRESSION: Moderate right pleural effusion. Associated right lower lobe opacity, likely compressive atelectasis. Electronically Signed   By: Charline Bills M.D.   On:  01/03/2017 16:48    Raliegh Ip, DO 01/03/2017, 7:13 PM PGY-3, [facility] FPTS Intern pager: [phone], text pages welcome

## 2017-01-03 NOTE — ED Triage Notes (Signed)
Report from [facility]> Pt from home.  C/o R sided chest pain and increased SOB.  Pt has history of COPD.  Dialysis on Tuesday, Thursday, Saturday.  Pt alert to person only.

## 2017-01-03 NOTE — ED Provider Notes (Signed)
[facility]-EMERGENCY DEPT Provider Note   CSN: [id number] Arrival date & time: 01/03/17  1548   History   Chief Complaint Chief Complaint  Patient presents with  . Chest Pain  . Shortness of Breath    HPI Claudia Rich is a 83 y.o. female.  HPI level 5 caveat due to altered mental status  83 year old female presents today with altered mental status.  Patient's power of attorney is [name] (patient's niece) she was on speaker throughout the evaluation and majority of patient's information was received from her.  Patient has a past medical history of hypertension, ESRD on dialysis, CAD, dementia, type 2 diabetes, asthma.  Patient undergoes dialysis on Tuesday Thursday Saturday most recently seen yesterday with full dialysis with no complications.  She notes that over the last several days patient has not been acting herself.  She notes that patient has been more weak, with a recent fall on Friday.  Patient is not normally on oxygen at home, it was noted to be hypoxic in the 80s today.  They also noted a cough with complaints of right-sided chest pain, and shortness of breath.  They deny any fevers at home, but notes that she normally does not run a fever when she is ill.  They also note very minor swelling in the feet bilateral.  Patient is unable to provide any significant information about her chest pain, but both her caretaker and niece over the phone reports she was describing right-sided chest pain.  Patient also noting pain to a sore on her sacrum.  They note that patient generally does not produce significant amount of urine due to renal disease.   Per chart review patient is a full code.  I discussed this with her power of attorney, and she would like to maintain full CODE STATUS.  She is very well-informed and understands that a meaningful outcome status post code would be highly unlikely that she would like to maintain full CODE STATUS until she has a formal discussion in person with the  patient   Past Medical History:  Diagnosis Date  . Anemia   . Colon polyps   . COPD (chronic obstructive pulmonary disease) (HCC)   . Diabetes mellitus   . Diverticulosis   . ESRD on hemodialysis (HCC)    [facility]S at [facility]. Started HD   . GERD (gastroesophageal reflux disease)   . Glaucoma   . Hammer toe   . Hyperlipidemia   . Hypertension   . Internal hemorrhoids   . Myocardial infarction (HCC) 10/2010  . Peripheral vascular disease (HCC)   . Stroke [facility](HCC) 10/2010   Has some memory impairment per DTR    Patient Active Problem List   Diagnosis Date Noted  . Pain of fifth toe 12/26/2016  . Chronic left hip pain 02/11/2016  . Trochanteric bursitis of left hip 01/17/2016  . Cutaneous horn 01/17/2016  . Balance problem 02/27/2015  . Preventative health care 12/14/2014  . DM2 (diabetes mellitus, type 2) (HCC) 12/14/2014  . Back pain 12/14/2014  . Hearing difficulty 10/10/2013  . Chronic restrictive lung disease 01/23/2013  . Pancreatic cyst 01/23/2013  . Dementia 07/02/2012  . CAD (coronary artery disease) 05/23/2012  . ESRD on dialysis (HCC) 05/07/2010  . Macrocytic anemia 03/20/2009  . Essential hypertension, benign 06/10/2007  . Hyperlipidemia 10/29/2006  . G E R D 10/29/2006    Past Surgical History:  Procedure Laterality Date  . ABDOMINAL HYSTERECTOMY    . AV FISTULA PLACEMENT    .  AV FISTULA PLACEMENT  08/14/2011   Procedure: ARTERIOVENOUS (AV) FISTULA CREATION;  Surgeon: Chuck Hint, MD;  Location: [facility] OR;  Service: Vascular;  Laterality: Right;  Right Arm Arteriovenous Fistula Creation/ contact pt's niece,[name] for any information to be relayed to pt: # [phone]  . AV FISTULA PLACEMENT  12/07/2011   Procedure: INSERTION OF ARTERIOVENOUS (AV) GORE-TEX GRAFT ARM;  Surgeon: Sherren Kerns, MD;  Location: [facility] OR;  Service: Vascular;  Laterality: Left;  . AV FISTULA PLACEMENT, BRACHIOCEPHALIC  09/24/2010   Left  . AV FISTULA REPAIR  02/16/2011    Revision of left brachiocephalic arteriovenous fistula with patch angioplasty of left brachiocephalic fistula  . CATARACT EXTRACTION    . EYE SURGERY    . INSERTION OF DIALYSIS CATHETER  09/28/2011   Procedure: INSERTION OF DIALYSIS CATHETER;  Surgeon: Sherren Kerns, MD;  Location: [facility] OR;  Service: Vascular;  Laterality: Left;  Insertion of Dialysis Catheter left IJ  . SHUNTOGRAM Right 09/28/2011   Procedure: SHUNTOGRAM;  Surgeon: Chuck Hint, MD;  Location: [facility] CATH LAB;  Service: Cardiovascular;  Laterality: Right;  . SHUNTOGRAM N/A 10/09/2011   Procedure: Betsey Amen;  Surgeon: Sherren Kerns, MD;  Location: [facility] C[facility] LAB;  Service: Cardiovascular;  Laterality: N/A;  . THROMBECTOMY AND REVISION OF ARTERIOVENTOUS (AV) GORETEX  GRAFT  06/28/2012   Procedure: THROMBECTOMY AND REVISION OF ARTERIOVENTOUS (AV) GORETEX  GRAFT;  Surgeon: Chuck Hint, MD;  Location: [facility] OR;  Service: Vascular;  Laterality: Left;    OB History    No data available       Home Medications    Prior to Admission medications   Medication Sig Start Date End Date Taking? Authorizing Provider  acetaminophen (TYLENOL) 500 MG tablet Take 500 mg by mouth every 6 (six) hours as needed for mild pain.    [provider]  amLODipine (NORVASC) 10 MG tablet Take 1 tablet (10 mg total) by mouth every evening. 01/29/16   Rumley, Lora Havens, DO  aspirin 81 MG chewable tablet Chew 81 mg by mouth every morning.     [provider]  atorvastatin (LIPITOR) 80 MG tablet TAKE ONE TABLET AT BEDTIME. 04/28/16   Rumley, [place] N, DO  B Complex-C-Folic Acid (RENAL MULTIVITAMIN FORMULA PO) Take 1 tablet by mouth every evening.     [provider]  calcium acetate (PHOSLO) 667 MG capsule Take 1,334 mg by mouth 2 (two) times daily.     [provider]  cephALEXin (KEFLEX) 500 MG capsule Take 1 capsule (500 mg total) by mouth daily. 07/03/16   Rumley, Lora Havens, DO  docusate sodium (COLACE) 100 MG  capsule Take 100 mg by mouth 2 (two) times daily.    [provider]  fluticasone-salmeterol (ADVAIR HFA) 230-21 MCG/ACT inhaler Inhale 2 puffs into the lungs 2 (two) times daily. 07/03/15   Rumley, Lora Havens, DO  hydrocortisone cream-nystatin cream-zinc oxide Apply 1 application topically 2 (two) times daily. 04/17/16   Rumley, [place] N, DO  JANUVIA 25 MG tablet TAKE 1 TABLET ONCE DAILY. 12/28/16   Rumley, [place] N, DO  meloxicam (MOBIC) 7.5 MG tablet TAKE 1 TABLET DAILY. TAKE UP TO 2-4 WEEKS IF HELPING 08/06/16   Rumley, [place] N, DO  metoprolol tartrate (LOPRESSOR) 25 MG tablet TAKE 1 TABLET TWICE DAILY ON NON-DIALYSIS DAYS AND TAKE 1 TABLET DAILY ON DIALYSIS DAYS. 08/06/16   Rumley,  N, DO  QC LORATADINE ALLERGY RELIEF 10 MG tablet TAKE 1 TABLET ONCE DAILY. 12/31/16  Rumley, [place] N, DO  ranitidine (ZANTAC) 150 MG capsule Take 1 capsule (150 mg total) by mouth 2 (two) times daily. For stomach ulcer 10/30/15   Rumley, [place] N, DO  ranitidine (ZANTAC) 150 MG tablet TAKE 1 TABLET TWICE DAILY. 12/28/16   Rumley, [place] N, DO  tiotropium (SPIRIVA HANDIHALER) 18 MCG inhalation capsule Place 1 capsule (18 mcg total) into inhaler and inhale daily. Patient taking differently: Place 18 mcg into inhaler and inhale every evening.  07/10/14   Tommie Sams, DO  traMADol (ULTRAM) 50 MG tablet Take 1 tablet (50 mg total) by mouth every 8 (eight) hours as needed. 12/14/14   Tommie Sams, DO  VENTOLIN HFA 108 (90 Base) MCG/ACT inhaler USE 2 PUFFS ONCE A DAY AS NEEDED 10/20/16   Rumley, Lora Havens, DO  zoster vaccine live, PF, (ZOSTAVAX) 95284 UNT/0.65ML injection Inject 19,400 Units into the skin once. Patient not taking: Reported on 01/18/2015 12/14/14   Tommie Sams, DO  zoster vaccine live, PF, (ZOSTAVAX) 13244 UNT/0.65ML injection Inject 19,400 Units into the skin once. Patient not taking: Reported on 01/18/2015 12/14/14   Tommie Sams, DO    Family History Family History  Problem Relation  Age of Onset  . Hypertension Sister   . Anesthesia problems Neg Hx     Social History Social History  Substance Use Topics  . Smoking status: Never Smoker  . Smokeless tobacco: Never Used  . Alcohol use No     Allergies   Codeine sulfate and Meperidine hcl   Review of Systems Review of Systems  Unable to perform ROS: Dementia   Physical Exam Updated Vital Signs BP (!) 153/54   Pulse 95   Temp 98.6 F (37 C) (Oral)   Resp (!) 26   Wt 66.7 kg   SpO2 97%   BMI 23.73 kg/m   Physical Exam  HENT:  Head: Normocephalic.  Eyes: Conjunctivae are normal. Pupils are equal, round, and reactive to light.  Neck: Normal range of motion.  Cardiovascular: Normal rate and regular rhythm.   Pulmonary/Chest: Effort normal. No respiratory distress.  Minor expiratory wheeze right, crackles noted RL  Musculoskeletal:  Small amount of bilateral pedal edema  Skin: Skin is warm.  Superficial sacral ulcer no surrounding cellulitis   Nursing note and vitals reviewed.    ED Treatments / Results  Labs (all labs ordered are listed, but only abnormal results are displayed) Labs Reviewed  BASIC METABOLIC PANEL - Abnormal; Notable for the following:       Result Value   Sodium 133 (*)    Potassium 2.7 (*)    Chloride 93 (*)    Glucose, Bld 232 (*)    BUN 25 (*)    Creatinine, Ser 6.01 (*)    GFR calc non Af Amer 6 (*)    GFR calc Af Amer 7 (*)    All other components within normal limits  CBC - Abnormal; Notable for the following:    WBC 14.1 (*)    RBC 2.79 (*)    Hemoglobin 8.8 (*)    HCT 27.8 (*)    RDW 16.7 (*)    All other components within normal limits  BRAIN NATRIURETIC PEPTIDE - Abnormal; Notable for the following:    B Natriuretic Peptide 110.3 (*)    All other components within normal limits  CULTURE, BLOOD (ROUTINE X 2)  CULTURE, BLOOD (ROUTINE X 2)  I-STAT TROPOININ, ED    EKG  EKG Interpretation  Date/Time:  Sunday Jan 03 2017 15:49:41 EDT Ventricular  Rate:  86 PR Interval:    QRS Duration: 104 QT Interval:  388 QTC Calculation: 465 R Axis:   9 Text Interpretation:  Sinus rhythm Nonspecific T abnormalities, lateral leads t wave changes new since last tracing Confirmed by KNAPP  MD-J, JON [phone]) on 01/03/2017 3:54:24 PM       Radiology Dg Chest 2 View  Result Date: 01/03/2017 CLINICAL DATA:  Right chest pain, shortness of breath EXAM: CHEST  2 VIEW COMPARISON:  03/13/2014 FINDINGS: Moderate right pleural effusion. Associated right lower lobe opacity, likely compressive atelectasis. No frank interstitial edema.  No pneumothorax. The heart is normal in size. Vascular stent overlying the mediastinum. Old left rib fracture deformities. IMPRESSION: Moderate right pleural effusion. Associated right lower lobe opacity, likely compressive atelectasis. Electronically Signed   By: Charline Bills M.D.   On: 01/03/2017 16:48    Procedures Procedures (including critical care time)  Medications Ordered in ED Medications  potassium chloride 10 mEq in 100 mL IVPB (10 mEq Intravenous New Bag/Given 01/03/17 1834)  ceFEPIme (MAXIPIME) 1 g in dextrose 5 % 50 mL IVPB (not administered)  albuterol (PROVENTIL) (2.5 MG/3ML) 0.083% nebulizer solution 2.5 mg (2.5 mg Nebulization Given 01/03/17 1733)     Initial Impression / Assessment and Plan / ED Course  I have reviewed the triage vital signs and the nursing notes.  Pertinent labs & imaging results that were available during my care of the patient were reviewed by me and considered in my medical decision making (see chart for details).     Final Clinical Impressions(s) / ED Diagnoses   Final diagnoses:  Community acquired pneumonia of left lower lobe of lung (HCC)    Labs: BMP, CBC, i-STAT troponin, blood cultures  Imaging: DG Chest 2 View  Consults: Family medicine  Therapeutics: Cefepime and vancomycin, potassium  Discharge Meds:   Assessment/Plan: 83 year old female presents today  with change in mental status, cough, shortness of breath. Patient's initial workup concerning for pneumonia versus heart failure.  Chest x-ray shows right-sided pleural effusion with right-sided opacity.  Patient's vital signs have remained stable with no signs of sepsis, BNP will be ordered.   The only slightly elevated, higher suspicion for pneumonia in this patient.  She will be given antibiotic here, admitted to the hospital for ongoing management.  She is a family medicine patient so family medicine will be consulted.  Patient has remained stable and is no acute distress here in the ED.   New Prescriptions New Prescriptions   No medications on file     Rosalio Loud 01/03/17 1918    Rosalio Loud 01/03/17 1919    Lorre Nick, MD 01/03/17 Norberta Keens    Lorre Nick, MD 01/03/17 2015

## 2017-01-03 NOTE — ED Notes (Signed)
Pt to radiology.  Drink given to family member and updated on plan of care.

## 2017-01-03 NOTE — ED Notes (Signed)
Date and time results received: 01/03/17 1756   Test: Potassium Critical Value: 2.7  Name of Provider Notified: Burna FortsJeff Hedges, PA and Dr. Freida BusmanAllen

## 2017-01-04 ENCOUNTER — Other Ambulatory Visit: Payer: Self-pay

## 2017-01-04 ENCOUNTER — Inpatient Hospital Stay (HOSPITAL_COMMUNITY): Payer: Medicare HMO

## 2017-01-04 DIAGNOSIS — L8990 Pressure ulcer of unspecified site, unspecified stage: Secondary | ICD-10-CM | POA: Insufficient documentation

## 2017-01-04 DIAGNOSIS — Z992 Dependence on renal dialysis: Secondary | ICD-10-CM

## 2017-01-04 DIAGNOSIS — N186 End stage renal disease: Secondary | ICD-10-CM

## 2017-01-04 LAB — RENAL FUNCTION PANEL
Albumin: 2.4 g/dL — ABNORMAL LOW (ref 3.5–5.0)
Anion gap: 15 (ref 5–15)
BUN: 32 mg/dL — ABNORMAL HIGH (ref 6–20)
CALCIUM: 9 mg/dL (ref 8.9–10.3)
CO2: 26 mmol/L (ref 22–32)
CREATININE: 6.07 mg/dL — AB (ref 0.44–1.00)
Chloride: 95 mmol/L — ABNORMAL LOW (ref 101–111)
GFR calc Af Amer: 6 mL/min — ABNORMAL LOW (ref >60)
GFR, EST NON AFRICAN AMERICAN: 6 mL/min — AB (ref >60)
Glucose, Bld: 223 mg/dL — ABNORMAL HIGH (ref 65–99)
Phosphorus: 4 mg/dL (ref 2.5–4.6)
Potassium: 3.2 mmol/L — ABNORMAL LOW (ref 3.5–5.1)
SODIUM: 136 mmol/L (ref 135–145)

## 2017-01-04 LAB — TROPONIN I: Troponin I: 0.04 ng/mL (ref <0.03)

## 2017-01-04 LAB — CBC
HCT: 30.6 % — ABNORMAL LOW (ref 36.0–46.0)
Hemoglobin: 9.8 g/dL — ABNORMAL LOW (ref 12.0–15.0)
MCH: 31.8 pg (ref 26.0–34.0)
MCHC: 32 g/dL (ref 30.0–36.0)
MCV: 99.4 fL (ref 78.0–100.0)
PLATELETS: 200 10*3/uL (ref 150–400)
RBC: 3.08 MIL/uL — ABNORMAL LOW (ref 3.87–5.11)
RDW: 16.9 % — ABNORMAL HIGH (ref 11.5–15.5)
WBC: 18.6 10*3/uL — ABNORMAL HIGH (ref 4.0–10.5)

## 2017-01-04 LAB — MRSA PCR SCREENING: MRSA by PCR: POSITIVE — AB

## 2017-01-04 LAB — GLUCOSE, CAPILLARY
GLUCOSE-CAPILLARY: 198 mg/dL — AB (ref 65–99)
GLUCOSE-CAPILLARY: 297 mg/dL — AB (ref 65–99)
Glucose-Capillary: 264 mg/dL — ABNORMAL HIGH (ref 65–99)
Glucose-Capillary: 253 mg/dL — ABNORMAL HIGH (ref 65–99)

## 2017-01-04 MED ORDER — DEXTROSE 5 % IV SOLN: 2.0000 g | INTRAVENOUS | Status: DC

## 2017-01-04 MED ORDER — FERRIC CITRATE 1 GM 210 MG(FE) PO TABS
420.0000 mg | ORAL_TABLET | Freq: Three times a day (TID) | ORAL | Status: DC
  Administered 2017-01-04 – 2017-01-14 (×19): 420 mg via ORAL
  Filled 2017-01-04 (×34): qty 2

## 2017-01-04 MED ORDER — PRO-STAT SUGAR FREE PO LIQD
30.0000 mL | Freq: Two times a day (BID) | ORAL | Status: DC
  Administered 2017-01-04 – 2017-01-14 (×15): 30 mL via ORAL
  Filled 2017-01-04 (×16): qty 30

## 2017-01-04 MED ORDER — VANCOMYCIN HCL IN DEXTROSE 750-5 MG/150ML-% IV SOLN
750.0000 mg | INTRAVENOUS | Status: DC
  Administered 2017-01-05 – 2017-01-09 (×3): 750 mg via INTRAVENOUS
  Filled 2017-01-04 (×4): qty 150

## 2017-01-04 MED ORDER — DEXTROSE 5 % IV SOLN
1.0000 g | Freq: Once | INTRAVENOUS | Status: AC
  Administered 2017-01-04: 1 g via INTRAVENOUS
  Filled 2017-01-04: qty 1

## 2017-01-04 MED ORDER — POTASSIUM CHLORIDE CRYS ER 20 MEQ PO TBCR
30.0000 meq | EXTENDED_RELEASE_TABLET | Freq: Once | ORAL | Status: AC
  Administered 2017-01-04: 30 meq via ORAL
  Filled 2017-01-04: qty 1

## 2017-01-04 MED ORDER — DARBEPOETIN ALFA 100 MCG/0.5ML IJ SOSY
100.0000 ug | PREFILLED_SYRINGE | INTRAMUSCULAR | Status: DC
  Administered 2017-01-05 – 2017-01-12 (×2): 100 ug via INTRAVENOUS
  Filled 2017-01-04 (×2): qty 0.5

## 2017-01-04 MED ORDER — DOXERCALCIFEROL 4 MCG/2ML IV SOLN
2.0000 ug | INTRAVENOUS | Status: DC
  Administered 2017-01-05 – 2017-01-14 (×5): 2 ug via INTRAVENOUS
  Filled 2017-01-04 (×5): qty 2

## 2017-01-04 MED ORDER — IOPAMIDOL (ISOVUE-370) INJECTION 76%
INTRAVENOUS | Status: AC
  Administered 2017-01-04: 100 mL via INTRAVENOUS
  Filled 2017-01-04: qty 100

## 2017-01-04 NOTE — Progress Notes (Signed)
[facility]  Patient Status: Active (receiving services up to time of hospitalization)  [facility] is providing the following services: RN  Admitted to Hospital prior to start of services.  If patient discharges after hours, please call [phone].   Kizzie FurnishDonna Fellmy 01/04/2017, 9:39 AM

## 2017-01-04 NOTE — Progress Notes (Signed)
Family Medicine Teaching Service Daily Progress Note Intern Pager: [phone]  Patient name: Claudia Rich Medical record number: [id number] Date of birth: Sep 12, 1926 Age: 83 y.o. Gender: female  Primary Care Provider: Araceli Boucheumley, Eden N, DO Consultants: Renal Code Status: Full  Pt Overview and Major Events to Date:    Assessment and Plan: Claudia Ridgesddie R Langenberg is a 83 y.o. female presenting with SOB, AMS . PMH is significant for ESRD on HD, dementia, CAD, T2DM, asthma.   SOB/Hypoxia:  Likely 2/2 HCAP, CXR with RML opacity. Leukocytosis worsening at 18.6 from 14.1. Treating with Vanc and cefepime dosed per pharm. Considered CHF (BNP 110) and CXR negative for fluid congestion. sTrop 0.03, EKG with T wave changes likely 2/2 hypokalemia, now resolved. Procalcitonin 1.52. Patient noted to be tachypnic by the attending this AM.   - Monitor respiratory status and consider CT chest if it does not improve. - Continue to monitor  telemetry - VS per floor protocol - Vanc/ Cefepime per pharmacy (5/13>)   - CBC in am - O2 prn saturations <92%. - f/u BCx obtained in ED - PT consult  AMS: described by family as "not as perky".  h/o dementia.  - Consider CT head if develops neurologic deficits or does not improve with abx - PT/OT/SLP consult  Right sided CP, chronic. She has h/o CAD/ HTN.  Has h/o right sided chest wall injury involving rib fractures 06/2012.  She had a negative Myoview in 05/2012.  She is on ASA, Norvasc, Lopresor, Lipitor 80mg  at home.  She is at increased risk of cardiac disease given ESRD on HD and DM2. Trop neg. EKG with T wave changes, likely related to potassium status.  Doubt ischemia at this time. Troponin 0.03, AM EKG improved, Tw changes likely were due to hypokalemia.  Hypokalemia: K 2.7 >>> 3.2 s/p repletion 10 mEq KCL IV. Mag 1.7 - am EKG pending - will give 30 mEq KDUR - monitor AM BMP  ESRD on HD: Tu,Th, Sat.  Electrolytes as above.  Occ produces small amounts of  urine. - c/s renal as noted above - pharmacy to renally dose Vanc  DM2:  11/2016 A1c 7.9. On Januvia at home. - sSSI while hospitalized - BG checks qAC   Pressure wound: per POA is sacral wound.  This was not well visualized on exam. - c/s Wound RN  GERD w/ h/o peptic ulcer: on Zantac 150mg  BID - will put on PPI, as this does not require renal dosing.   - If patient prefers to be on H2 blocker going forward, dose should be 1mg /kg/d of Zantac in setting of ESRD on HD.  FEN/GI: SLIV, HH/Carb mod renal diet, Protonix Prophylaxis: Lovenox subq renal dosed   Disposition: Home pending transition to PO Abx  Subjective:  NAD, rests comfortably in bed. Pleasant and appropriate. Endorses R-sided chest pain which she says is consistent with previous symptoms.   Objective: Temp:  [97.4 F (36.3 C)-98.7 F (37.1 C)] 97.4 F (36.3 C) (05/14 0950) Pulse Rate:  [81-95] 86 (05/14 0950) Resp:  [16-27] 17 (05/14 0950) BP: (113-157)/(41-93) 139/45 (05/14 0950) SpO2:  [82 %-100 %] 95 % (05/14 0950) Weight:  [66.7 kg (147 lb)-68 kg (150 lb)] 68 kg (149 lb 14.6 oz) (05/14 0500) Physical Exam: General: NAD, pleasant, rests comfortably in bed Cardiovascular: RRR, no m/r/g Respiratory: CTA bil, no W/R/R Abdomen: soft and nontender, nondistended Extremities: no LE edema  Laboratory:  Recent Labs Lab 01/03/17 1719 01/04/17 0602  WBC 14.1* 18.6*  HGB 8.8* 9.8*  HCT 27.8* 30.6*  PLT 201 200    Recent Labs Lab 01/03/17 1719 01/04/17 0602  NA 133* 136  K 2.7* 3.2*  CL 93* 95*  CO2 28 26  BUN 25* 32*  CREATININE 6.01* 6.07*  CALCIUM 9.1 9.0  GLUCOSE 232* 223*     Imaging/Diagnostic Tests: Dg Chest 2 View: 01/03/2017 FINDINGS: Moderate right pleural effusion. Associated right lower lobe opacity, likely compressive atelectasis. No frank interstitial edema.  No pneumothorax. The heart is normal in size. Vascular stent overlying the mediastinum. Old left rib fracture deformities.   IMPRESSION: Moderate right pleural effusion. Associated right lower lobe opacity, likely compressive atelectasis.    Howard Pouch, MD 01/04/2017, 10:59 AM PGY-1, [facility] Family Medicine FPTS Intern pager: [phone], text pages welcome

## 2017-01-04 NOTE — Progress Notes (Signed)
Patient desat down to 88-90% a couple of times today. Patient at rest is satting at 95% on 2L. Course crackles throughout lungs. Family Medicine aware.  Avelina LaineKimberly Olimpia Tinch RN

## 2017-01-04 NOTE — Evaluation (Signed)
Physical Therapy Evaluation Patient Details Name: Claudia Rich MRN: [id number] DOB: 12-26-1926 Today's Date: 01/04/2017   History of Present Illness  Claudia Rich is a 83 y.o. female with ESRD, COPD, HTN, Hx CVA, CAD, Type 2 DM who was admitted with pneumonia.  Clinical Impression   Pt admitted with above diagnosis. Pt currently with functional limitations due to the deficits listed below (see PT Problem List). [name] desats significantly with minimal activity; Session initiated on 2 L supplemental O2 and noted O2 sats dropped to 78% (observed lowest with good wave); recovered with 5-7 minutes of seated rest, focused breathing, and incr form 2 L to 3L supplemental O2; Dr. Chanetta Marshallimberlake arrived to assess; Assisted pt back to bed in prep fro transport to Radiology; Again desatted, thoguh not as low; Ended session in bed, on 2 L , and satting at 97%; RN aware;   Pt will benefit from skilled PT to increase their independence and safety with mobility to allow discharge to the venue listed below.       Follow Up Recommendations SNF    Equipment Recommendations  Rolling walker with 5" wheels;3in1 (PT)    Recommendations for Other Services OT consult     Precautions / Restrictions Precautions Precautions: Fall;Other (comment) Precaution Comments: Watch O2 sats closely      Mobility  Bed Mobility Overal bed mobility: Needs Assistance Bed Mobility: Supine to Sit;Sit to Supine     Supine to sit: Mod assist Sit to supine: Mod assist   General bed mobility comments: Mod assist to elevate trunk to sitting, and to square off hips at EOB; Mod assist to help LEs back into the bed  Transfers Overall transfer level: Needs assistance Equipment used: 1 person hand held assist Transfers: Sit to/from BJ'sStand;Stand Pivot Transfers Sit to Stand: Mod assist Stand pivot transfers: Mod assist       General transfer comment: Heavy mod assist and close guard of knees due to  buckling  Ambulation/Gait                Stairs            Wheelchair Mobility    Modified Rankin (Stroke Patients Only)       Balance                                             Pertinent Vitals/Pain Pain Assessment: Faces Faces Pain Scale: Hurts even more Pain Location: Chest pain with movement Pain Descriptors / Indicators: Grimacing Pain Intervention(s): Monitored during session    Home Living Family/patient expects to be discharged to:: Private residence Living Arrangements: Alone Available Help at Discharge: Personal care attendant Type of Home: House Home Access: Stairs to enter Entrance Stairs-Rails: None Entrance Stairs-Number of Steps: 3 Home Layout: Multi-level;Able to live on main level with bedroom/bathroom   Additional Comments: Will need to verify the above information    Prior Function Level of Independence: Independent         Comments: Tells me she takes the bus to HD; Unable to tell me how long the bus ride is typically, or if she has to change buses     Hand Dominance   Dominant Hand: Right    Extremity/Trunk Assessment   Upper Extremity Assessment Upper Extremity Assessment: Generalized weakness    Lower Extremity Assessment Lower Extremity Assessment: Generalized weakness  Communication   Communication: Other (comment) (Limited by not moving much air)  Cognition Arousal/Alertness: Awake/alert Behavior During Therapy: WFL for tasks assessed/performed Overall Cognitive Status: Impaired/Different from baseline                                 General Comments: Unable to give understandable detials re: how she gets to and from HD, something she must do 3x/week      General Comments      Exercises     Assessment/Plan    PT Assessment Patient needs continued PT services  PT Problem List Decreased strength;Decreased activity tolerance;Decreased balance;Decreased  mobility;Decreased coordination;Decreased cognition;Decreased knowledge of use of DME;Decreased safety awareness;Decreased knowledge of precautions;Cardiopulmonary status limiting activity       PT Treatment Interventions DME instruction;Gait training;Stair training;Functional mobility training;Therapeutic activities;Therapeutic exercise;Balance training;Neuromuscular re-education;Cognitive remediation;Patient/family education    PT Goals (Current goals can be found in the Care Plan section)  Acute Rehab PT Goals Patient Stated Goal: Did not state PT Goal Formulation: Patient unable to participate in goal setting Time For Goal Achievement: 01/18/17 Potential to Achieve Goals: Fair    Frequency Min 3X/week   Barriers to discharge Decreased caregiver support Must be completely modified independent to be able to dc home    Co-evaluation               AM-PAC PT "6 Clicks" Daily Activity  Outcome Measure Difficulty turning over in bed (including adjusting bedclothes, sheets and blankets)?: Total Difficulty moving from lying on back to sitting on the side of the bed? : Total Difficulty sitting down on and standing up from a chair with arms (e.g., wheelchair, bedside commode, etc,.)?: Total Help needed moving to and from a bed to chair (including a wheelchair)?: A Lot Help needed walking in hospital room?: A Lot Help needed climbing 3-5 steps with a railing? : Total 6 Click Score: 8    End of Session Equipment Utilized During Treatment: Gait belt;Oxygen Activity Tolerance: Other (comment) (Limited by decr functional capacity) Patient left: in bed;with call Tullo/phone within reach Nurse Communication: Mobility status PT Visit Diagnosis: Unsteadiness on feet (R26.81);Muscle weakness (generalized) (M62.81);Other (comment) (Decr functional capacity)    Time: 1610-9604 PT Time Calculation (min) (ACUTE ONLY): 30 min   Charges:   PT Evaluation $PT Eval Moderate Complexity: 1  Procedure PT Treatments $Therapeutic Activity: 8-22 mins   PT G Codes:        Van Clines, PT  Acute Rehabilitation Services Pager [phone] Office [phone]   Levi Aland 01/04/2017, 4:20 PM

## 2017-01-04 NOTE — Progress Notes (Signed)
Pharmacy Antibiotic Note  Claudia Rich is a 83 y.o. female admitted on 01/03/2017 with AMS and SOB. Pt also noted to have a sore on sacram. Starting broad spectrum antibiotics for pneumonia. Pt is ESRD on HD.  The patient received Vancomycin 1500 mg and Cefepime 1g loading doses around 2000 on 5/13. ESRD-TTS, assuming HD to stay on schedule  Plan: 1. Start Vancomycin 750 mg IV post HD-TTS 2. Cefepime 1g IV x 1 dose today @ 2000 3. Start Cefepime 2g on TTS @ 1800 starting on 5/15 4. Will continue to follow HD schedule/duration, culture results, LOT, and antibiotic de-escalation plans    Weight: 149 lb 14.6 oz (68 kg)  Temp (24hrs), Avg:98.5 F (36.9 C), Min:98.2 F (36.8 C), Max:98.7 F (37.1 C)   Recent Labs Lab 01/03/17 1719 01/04/17 0602  WBC 14.1* 18.6*  CREATININE 6.01* 6.07*    Estimated Creatinine Clearance: 6.1 mL/min (A) (by C-G formula based on SCr of 6.07 mg/dL (H)).    Allergies  Allergen Reactions  . Codeine Sulfate Other (See Comments)    "It made me sick"  . Meperidine Hcl Nausea And Vomiting    REACTION: vomiting    Antimicrobials this admission: 5/13 cefepime x1 5/13 vancomycin >  Dose adjustments this admission: N/A  Microbiology results: 5/13 BCx >> 5/13 MRSA PCR >>  Thank you for allowing pharmacy to be a part of this patient's care.  Georgina PillionElizabeth Sadonna Kotara, PharmD, BCPS Clinical Pharmacist Pager: [phone] Clinical phone for 01/04/2017 from 7a-3:30p: [phone] If after 3:30p, please call main pharmacy at: [phone] 01/04/2017 8:29 AM

## 2017-01-04 NOTE — Progress Notes (Signed)
Admitted to 6E22 via stretcher from ED. Spoke with niece," Claudia Rich [facility](POA) " who provided admission information. Assessment as charted.

## 2017-01-04 NOTE — Consult Note (Signed)
[facility] Renal Consultation Note    Indication for Consultation:  Management of ESRD/hemodialysis, anemia, hypertension/volume, and secondary hyperparathyroidism. PCP:  HPI: Claudia Rich is a 83 y.o. female with ESRD, COPD, HTN, Hx CVA, CAD, Type 2 DM who was admitted with pneumonia.   Pt reports that has had cough, congestion, dyspnea, and R sided chest pain since last Friday. No fever or chills. No N/V/D. Per notes, family had noticed that she was not acting like her usual baseline. Brought to ED last night with worsening dyspnea/CP. In ED, found to be hypoxic, requiring nasal oxygen. CXR showed R pleural effusion with RLL opacity. Labs with K 2.7, Hgb 8.8, WBC 14.1. EKG was slightly abnormal, felt to be related to low K, and this was supplemented. Today, she is having productive cough with yellow sputum. Still with R chest pain which is worse with coughing.  From renal standpoint, dialyzes TTS at [facility]. Last HD was Saturday, 5/12 which she completed in entirety.  Past Medical History:  Diagnosis Date  . Anemia   . Colon polyps   . COPD (chronic obstructive pulmonary disease) (HCC)   . Diabetes mellitus   . Diverticulosis   . ESRD on hemodialysis (HCC)    TTS at [facility]. Started HD   . GERD (gastroesophageal reflux disease)   . Glaucoma   . Hammer toe   . Hyperlipidemia   . Hypertension   . Internal hemorrhoids   . Myocardial infarction (HCC) 10/2010  . Peripheral vascular disease (HCC)   . Stroke [facility]) 10/2010   Has some memory impairment per DTR   Past Surgical History:  Procedure Laterality Date  . ABDOMINAL HYSTERECTOMY    . AV FISTULA PLACEMENT    . AV FISTULA PLACEMENT  08/14/2011   Procedure: ARTERIOVENOUS (AV) FISTULA CREATION;  Surgeon: Chuck Hint, MD;  Location: [facility] OR;  Service: Vascular;  Laterality: Right;  Right Arm Arteriovenous Fistula Creation/ contact pt's niece,[name] for any information to be relayed to pt: #  [phone]  . AV FISTULA PLACEMENT  12/07/2011   Procedure: INSERTION OF ARTERIOVENOUS (AV) GORE-TEX GRAFT ARM;  Surgeon: Sherren Kerns, MD;  Location: [facility] OR;  Service: Vascular;  Laterality: Left;  . AV FISTULA PLACEMENT, BRACHIOCEPHALIC  09/24/2010   Left  . AV FISTULA REPAIR  02/16/2011   Revision of left brachiocephalic arteriovenous fistula with patch angioplasty of left brachiocephalic fistula  . CATARACT EXTRACTION    . EYE SURGERY    . INSERTION OF DIALYSIS CATHETER  09/28/2011   Procedure: INSERTION OF DIALYSIS CATHETER;  Surgeon: Sherren Kerns, MD;  Location: [facility] OR;  Service: Vascular;  Laterality: Left;  Insertion of Dialysis Catheter left IJ  . SHUNTOGRAM Right 09/28/2011   Procedure: SHUNTOGRAM;  Surgeon: Chuck Hint, MD;  Location: [facility] CATH LAB;  Service: Cardiovascular;  Laterality: Right;  . SHUNTOGRAM N/A 10/09/2011   Procedure: Betsey Amen;  Surgeon: Sherren Kerns, MD;  Location: [facility] CATH LAB;  Service: Cardiovascular;  Laterality: N/A;  . THROMBECTOMY AND REVISION OF ARTERIOVENTOUS (AV) GORETEX  GRAFT  06/28/2012   Procedure: THROMBECTOMY AND REVISION OF ARTERIOVENTOUS (AV) GORETEX  GRAFT;  Surgeon: Chuck Hint, MD;  Location: [facility] OR;  Service: Vascular;  Laterality: Left;   Family History  Problem Relation Age of Onset  . Hypertension Sister   . Atrial fibrillation Sister   . Congestive Heart Failure Sister   . High Cholesterol Sister   . Breast cancer Sister   .  Pulmonary Hypertension Sister   . Stroke Sister   . Anesthesia problems Neg Hx    Social History:  reports that she has never smoked. She has never used smokeless tobacco. She reports that she does not drink alcohol or use drugs.  ROS: As per HPI otherwise negative.  Physical Exam: Vitals:   01/04/17 0500 01/04/17 0529 01/04/17 0700 01/04/17 0950  BP:  (!) 133/47  (!) 139/45  Pulse:  81  86  Resp:  16  17  Temp:  98.2 F (36.8 C)  97.4 F (36.3 C)  TempSrc:  Oral  Oral   SpO2:  97%  95%  Weight: 68 kg (149 lb 14.6 oz)     Height:   5\' 4"  (1.626 m)      General: Well developed, well nourished. Breathing slightly labored, on nasal oxygen. Head: Normocephalic, atraumatic, sclera non-icteric. Neck: Supple without lymphadenopathy/masses. Lungs: Rhonchi throughout all lung fields, not clearing mucus effectively, breathing shallow. Heart: RRR with normal S1, S2. No murmurs, rubs, or gallops appreciated. Abdomen: Soft, non-tender, non-distended. Musculoskeletal:  Strength and tone appear normal for age. Lower extremities: No LE edema or ischemic changes, no open wounds. Neuro: Alert and oriented X 3. Moves all extremities spontaneously. Psych:  Responds to questions appropriately with a normal affect. Dialysis Access: AVG + bruit.  Allergies  Allergen Reactions  . Codeine Sulfate Other (See Comments)    "It made me sick"  . Meperidine Hcl Nausea And Vomiting    REACTION: vomiting   Prior to Admission medications   Medication Sig Start Date End Date Taking? Authorizing Provider  acetaminophen (TYLENOL) 500 MG tablet Take 500 mg by mouth every 6 (six) hours as needed for mild pain.   Yes [provider]  amLODipine (NORVASC) 10 MG tablet Take 1 tablet (10 mg total) by mouth every evening. 01/29/16  Yes Rumley, Russell N, DO  aspirin 81 MG chewable tablet Chew 81 mg by mouth every morning.    Yes [provider]  atorvastatin (LIPITOR) 80 MG tablet TAKE ONE TABLET AT BEDTIME. 04/28/16  Yes Rumley, [place] N, DO  AURYXIA 1 GM 210 MG(Fe) tablet Take 1 tablet by mouth 3 (three) times daily. 11/04/16  Yes [provider]  B Complex-C-Folic Acid (RENAL MULTIVITAMIN FORMULA PO) Take 1 tablet by mouth every evening.    Yes [provider]  docusate sodium (COLACE) 100 MG capsule Take 100 mg by mouth daily as needed for mild constipation.    Yes [provider]  JANUVIA 25 MG tablet TAKE 1 TABLET ONCE DAILY. 12/28/16  Yes Rumley,  [place] N, DO  metoprolol tartrate (LOPRESSOR) 25 MG tablet TAKE 1 TABLET TWICE DAILY ON NON-DIALYSIS DAYS AND TAKE 1 TABLET DAILY ON DIALYSIS DAYS. 08/06/16  Yes Rumley, [place] N, DO  QC LORATADINE ALLERGY RELIEF 10 MG tablet TAKE 1 TABLET ONCE DAILY. 12/31/16  Yes Rumley, Hand N, DO  ranitidine (ZANTAC) 150 MG capsule Take 1 capsule (150 mg total) by mouth 2 (two) times daily. For stomach ulcer 10/30/15  Yes Rumley, [place] N, DO  VENTOLIN HFA [phone](90 Base) MCG/ACT inhaler USE 2 PUFFS ONCE A DAY AS NEEDED 10/20/16  Yes Rumley, Timpson N, DO   Current Facility-Administered Medications  Medication Dose Route Frequency Provider Last Rate Last Dose  . acetaminophen (TYLENOL) tablet 650 mg  650 mg Oral Q8H PRN Delynn FlavinGottschalk, Ashly M, DO       Or  . acetaminophen (TYLENOL) suppository 650 mg  650 mg Rectal  Q8H PRN Delynn Flavin M, DO      . albuterol (PROVENTIL) (2.5 MG/3ML) 0.083% nebulizer solution 2.5 mg  2.5 mg Nebulization Q4H PRN Nestor Ramp, MD      . amLODipine (NORVASC) tablet 10 mg  10 mg Oral QPM Delynn Flavin M, DO   10 mg at 01/03/17 2328  . aspirin chewable tablet 81 mg  81 mg Oral q morning - 10a Delynn Flavin M, DO   81 mg at 01/04/17 1610  . atorvastatin (LIPITOR) tablet 80 mg  80 mg Oral QHS Delynn Flavin M, DO   80 mg at 01/03/17 2328  . docusate sodium (COLACE) capsule 100 mg  100 mg Oral Daily PRN Delynn Flavin M, DO      . enoxaparin (LOVENOX) injection 30 mg  30 mg Subcutaneous Q24H Gottschalk, Kathie Rhodes M, DO   30 mg at 01/03/17 2327  . insulin aspart (novoLOG) injection 0-9 Units  0-9 Units Subcutaneous TID WC Delynn Flavin M, DO   5 Units at 01/04/17 1203  . loratadine (CLARITIN) tablet 10 mg  10 mg Oral Daily Delynn Flavin M, DO   10 mg at 01/04/17 9604  . metoprolol tartrate (LOPRESSOR) tablet 25 mg  25 mg Oral BID Delynn Flavin M, DO   25 mg at 01/04/17 5409  . multivitamin (RENA-VIT) tablet 1 tablet  1 tablet Oral QHS Nestor Ramp, MD   1 tablet  at 01/03/17 2328  . pantoprazole (PROTONIX) EC tablet 40 mg  40 mg Oral Daily Delynn Flavin M, DO   40 mg at 01/04/17 8119  . polyethylene glycol (MIRALAX / GLYCOLAX) packet 17 g  17 g Oral Daily PRN Gottschalk, Ashly M, DO      . potassium chloride (K-DUR,KLOR-CON) CR tablet 30 mEq  30 mEq Oral Once Garth Bigness, MD      . sodium chloride flush (NS) 0.9 % injection 3 mL  3 mL Intravenous Q12H Delynn Flavin M, DO   3 mL at 01/04/17 0943   Labs: Basic Metabolic Panel:  Recent Labs Lab 01/03/17 1719 01/04/17 0602  NA 133* 136  K 2.7* 3.2*  CL 93* 95*  CO2 28 26  GLUCOSE 232* 223*  BUN 25* 32*  CREATININE 6.01* 6.07*  CALCIUM 9.1 9.0  PHOS  --  4.0   Liver Function Tests:  Recent Labs Lab 01/04/17 0602  ALBUMIN 2.4*   CBC:  Recent Labs Lab 01/03/17 1719 01/04/17 0602  WBC 14.1* 18.6*  HGB 8.8* 9.8*  HCT 27.8* 30.6*  MCV 99.6 99.4  PLT 201 200   Cardiac Enzymes:  Recent Labs Lab 01/03/17 2108 01/04/17 0740  TROPONINI 0.03* 0.04*   CBG:  Recent Labs Lab 01/03/17 2336 01/04/17 0737 01/04/17 1145  GLUCAP 203* 198* 264*   Studies/Results: Dg Chest 2 View  Result Date: 01/03/2017 CLINICAL DATA:  Right chest pain, shortness of breath EXAM: CHEST  2 VIEW COMPARISON:  03/13/2014 FINDINGS: Moderate right pleural effusion. Associated right lower lobe opacity, likely compressive atelectasis. No frank interstitial edema.  No pneumothorax. The heart is normal in size. Vascular stent overlying the mediastinum. Old left rib fracture deformities. IMPRESSION: Moderate right pleural effusion. Associated right lower lobe opacity, likely compressive atelectasis. Electronically Signed   By: Charline Bills M.D.   On: 01/03/2017 16:48    Dialysis Orders:  TTS at [facility] 3:45hrs, BFR 400, DFR A1.5, 2K/2.25Ca, Profile 4, EDW 67.5kg, AVG - Heparin 2000 unit bolus q HD - Hectoral IV q HD -  Mircera q 2 weeks (just reordered, last given  11/24/16)  Assessment/Plan: 1.  RLL Pneumonia: On Vanc/Cefepime for now. Using albuterol nebs prn. Per primary. 2.  ESRD: Continue TTS schedule, next 5/15. 4K bath with next HD. 3.  Hypertension/volume: BP fine, UF as tolerated. 4.  Anemia: Hgb 9.8. Will start Aranesp weekly. 5.  Metabolic bone disease: Ca/Phos controlled. Continue Auryxia as binder, VDRA. 6.  Nutrition: Alb 2.4. Add protein supps. 7.  Type 2 DM: Per primary.  Ozzie Hoyle, PA-C 01/04/2017, 12:07 PM  [facility] Pager: [phone]  Pt seen, examined and agree w A/P as above.  Vinson Moselle MD [employer] pager [phone]   01/04/2017, 4:52 PM

## 2017-01-04 NOTE — Consult Note (Signed)
WOC Nurse wound consult note Reason for Consult: pressure injury Wound type: Stage 2 Pressure injury Pressure Injury POA: Yes Wound bed: clean, pale, partial thickness opening Drainage (amount, consistency, odor) none Periwound: intact  Dressing procedure/placement/frequency: Soft silicone foam to protect and insulate.  Discussed POC with patient and bedside nurse.  Re consult if needed, will not follow at this time. Thanks  Yumiko Alkins M.[employer] MSN, RN,CWOCN, CNS [phone])

## 2017-01-04 NOTE — Evaluation (Signed)
Speech Language Pathology Evaluation Patient Details Name: Claudia Rich MRN: [id number] DOB: 1926-10-24 Today's Date: 01/04/2017 Time: 4098-1191 SLP Time Calculation (min) (ACUTE ONLY): 12 min  Problem List:  Patient Active Problem List   Diagnosis Date Noted  . Pressure injury of skin 01/04/2017  . HCAP (healthcare-associated pneumonia) 01/03/2017  . Pain of fifth toe 12/26/2016  . Chronic left hip pain 02/11/2016  . Trochanteric bursitis of left hip 01/17/2016  . Cutaneous horn 01/17/2016  . Balance problem 02/27/2015  . Preventative health care 12/14/2014  . DM2 (diabetes mellitus, type 2) (HCC) 12/14/2014  . Back pain 12/14/2014  . Hearing difficulty 10/10/2013  . Chronic restrictive lung disease 01/23/2013  . Pancreatic cyst 01/23/2013  . Dementia 07/02/2012  . CAD (coronary artery disease) 05/23/2012  . ESRD on dialysis (HCC) 05/07/2010  . Macrocytic anemia 03/20/2009  . Essential hypertension, benign 06/10/2007  . Hyperlipidemia 10/29/2006  . G E R D 10/29/2006   Past Medical History:  Past Medical History:  Diagnosis Date  . Anemia   . Colon polyps   . COPD (chronic obstructive pulmonary disease) (HCC)   . Diabetes mellitus   . Diverticulosis   . ESRD on hemodialysis (HCC)    TTS at [facility]. Started HD   . GERD (gastroesophageal reflux disease)   . Glaucoma   . Hammer toe   . Hyperlipidemia   . Hypertension   . Internal hemorrhoids   . Myocardial infarction (HCC) 10/2010  . Peripheral vascular disease (HCC)   . Stroke [facility]) 10/2010   Has some memory impairment per DTR   Past Surgical History:  Past Surgical History:  Procedure Laterality Date  . ABDOMINAL HYSTERECTOMY    . AV FISTULA PLACEMENT    . AV FISTULA PLACEMENT  08/14/2011   Procedure: ARTERIOVENOUS (AV) FISTULA CREATION;  Surgeon: Chuck Hint, MD;  Location: [facility] OR;  Service: Vascular;  Laterality: Right;  Right Arm Arteriovenous Fistula Creation/ contact pt's niece,[name] for any  information to be relayed to pt: # [phone]  . AV FISTULA PLACEMENT  12/07/2011   Procedure: INSERTION OF ARTERIOVENOUS (AV) GORE-TEX GRAFT ARM;  Surgeon: Sherren Kerns, MD;  Location: [facility] OR;  Service: Vascular;  Laterality: Left;  . AV FISTULA PLACEMENT, BRACHIOCEPHALIC  09/24/2010   Left  . AV FISTULA REPAIR  02/16/2011   Revision of left brachiocephalic arteriovenous fistula with patch angioplasty of left brachiocephalic fistula  . CATARACT EXTRACTION    . EYE SURGERY    . INSERTION OF DIALYSIS CATHETER  09/28/2011   Procedure: INSERTION OF DIALYSIS CATHETER;  Surgeon: Sherren Kerns, MD;  Location: [facility] OR;  Service: Vascular;  Laterality: Left;  Insertion of Dialysis Catheter left IJ  . SHUNTOGRAM Right 09/28/2011   Procedure: SHUNTOGRAM;  Surgeon: Chuck Hint, MD;  Location: [facility] CATH LAB;  Service: Cardiovascular;  Laterality: Right;  . SHUNTOGRAM N/A 10/09/2011   Procedure: Betsey Amen;  Surgeon: Sherren Kerns, MD;  Location: [facility] CATH LAB;  Service: Cardiovascular;  Laterality: N/A;  . THROMBECTOMY AND REVISION OF ARTERIOVENTOUS (AV) GORETEX  GRAFT  06/28/2012   Procedure: THROMBECTOMY AND REVISION OF ARTERIOVENTOUS (AV) GORETEX  GRAFT;  Surgeon: Chuck Hint, MD;  Location: [facility] OR;  Service: Vascular;  Laterality: Left;   HPI:  Pt is 83 yr old admitted for AMS. PMH: stroke (2012), GERD, DM, COPD/ Asthma, ESRD, chronic back pain. Per chart she lives alone and has an aid that comes to help her at home for about 2 hours  at a time. Found to have hypoxia. CXR with RLL opacity concerning for pna.   Assessment / Plan / Recommendation Clinical Impression  Pt administered portions of the Cognistat exhibiting impairments in temporal and situational orientation, basic verbal and functional problem solving. She is responsible for taking medication (states she has a machine that alerts her if she missed meds). No family present to provide additional details. Presently, pt's  cognitive impairments would prevent her from safely managing ADL's alone or for even part of the day and would benefit from 24 hour assist and supervision. ST will continue to follow.       SLP Assessment  SLP Recommendation/Assessment: Patient needs continued Speech Lanaguage Pathology Services SLP Visit Diagnosis: Cognitive communication deficit (R41.841)    Follow Up Recommendations  Skilled Nursing facility    Frequency and Duration min 2x/week  2 weeks      SLP Evaluation Cognition  Overall Cognitive Status: Impaired/Different from baseline Arousal/Alertness: Awake/alert Orientation Level: Oriented to person;Disoriented to time;Disoriented to situation Attention: Sustained Sustained Attention: Appears intact Memory:  (will assess further) Awareness: Impaired Awareness Impairment: Intellectual impairment;Emergent impairment;Anticipatory impairment Problem Solving: Impaired Problem Solving Impairment: Verbal basic;Functional basic Executive Function: Self Monitoring;Self Correcting Self Monitoring: Impaired Self Correcting: Impaired Safety/Judgment: Impaired       Comprehension  Auditory Comprehension Overall Auditory Comprehension: Appears within functional limits for tasks assessed Visual Recognition/Discrimination Discrimination: Not tested Reading Comprehension Reading Status:  (TBA)    Expression Expression Primary Mode of Expression: Verbal Verbal Expression Overall Verbal Expression: Appears within functional limits for tasks assessed   Oral / Motor  Motor Speech Overall Motor Speech: Appears within functional limits for tasks assessed   GO                    [name], Hisako Bugh Willis 01/04/2017, 1:44 PM  Breck CoonsLisa Willis Lillan Mccreadie M.Ed [employer] Pager [phone]

## 2017-01-04 NOTE — Progress Notes (Signed)
RT called for a PRN neb tx. Neb tx done. BBS Crackles Expiratory Wheeze pre and post neb tx. Report given to RN.

## 2017-01-05 ENCOUNTER — Inpatient Hospital Stay (HOSPITAL_COMMUNITY): Payer: Medicare HMO

## 2017-01-05 DIAGNOSIS — J189 Pneumonia, unspecified organism: Principal | ICD-10-CM

## 2017-01-05 DIAGNOSIS — J90 Pleural effusion, not elsewhere classified: Secondary | ICD-10-CM

## 2017-01-05 DIAGNOSIS — I272 Pulmonary hypertension, unspecified: Secondary | ICD-10-CM

## 2017-01-05 DIAGNOSIS — R0902 Hypoxemia: Secondary | ICD-10-CM

## 2017-01-05 DIAGNOSIS — Z9889 Other specified postprocedural states: Secondary | ICD-10-CM

## 2017-01-05 DIAGNOSIS — I871 Compression of vein: Secondary | ICD-10-CM

## 2017-01-05 LAB — RESPIRATORY PANEL BY PCR
ADENOVIRUS-RVPPCR: NOT DETECTED
Bordetella pertussis: NOT DETECTED
CORONAVIRUS NL63-RVPPCR: NOT DETECTED
CORONAVIRUS OC43-RVPPCR: NOT DETECTED
Chlamydophila pneumoniae: NOT DETECTED
Coronavirus 229E: NOT DETECTED
Coronavirus HKU1: NOT DETECTED
INFLUENZA A-RVPPCR: NOT DETECTED
Influenza B: NOT DETECTED
Metapneumovirus: NOT DETECTED
Mycoplasma pneumoniae: NOT DETECTED
PARAINFLUENZA VIRUS 1-RVPPCR: NOT DETECTED
PARAINFLUENZA VIRUS 3-RVPPCR: NOT DETECTED
PARAINFLUENZA VIRUS 4-RVPPCR: NOT DETECTED
Parainfluenza Virus 2: NOT DETECTED
RHINOVIRUS / ENTEROVIRUS - RVPPCR: NOT DETECTED
Respiratory Syncytial Virus: NOT DETECTED

## 2017-01-05 LAB — RENAL FUNCTION PANEL
ALBUMIN: 2.3 g/dL — AB (ref 3.5–5.0)
ANION GAP: 15 (ref 5–15)
BUN: 51 mg/dL — ABNORMAL HIGH (ref 6–20)
CHLORIDE: 94 mmol/L — AB (ref 101–111)
CO2: 23 mmol/L (ref 22–32)
Calcium: 9.3 mg/dL (ref 8.9–10.3)
Creatinine, Ser: 8 mg/dL — ABNORMAL HIGH (ref 0.44–1.00)
GFR, EST AFRICAN AMERICAN: 5 mL/min — AB (ref >60)
GFR, EST NON AFRICAN AMERICAN: 4 mL/min — AB (ref >60)
Glucose, Bld: 394 mg/dL — ABNORMAL HIGH (ref 65–99)
PHOSPHORUS: 4.2 mg/dL (ref 2.5–4.6)
POTASSIUM: 3.5 mmol/L (ref 3.5–5.1)
Sodium: 132 mmol/L — ABNORMAL LOW (ref 135–145)

## 2017-01-05 LAB — GLUCOSE, CAPILLARY
GLUCOSE-CAPILLARY: 160 mg/dL — AB (ref 65–99)
Glucose-Capillary: 375 mg/dL — ABNORMAL HIGH (ref 65–99)
Glucose-Capillary: 170 mg/dL — ABNORMAL HIGH (ref 65–99)
Glucose-Capillary: 170 mg/dL — ABNORMAL HIGH (ref 65–99)

## 2017-01-05 LAB — BODY FLUID CELL COUNT WITH DIFFERENTIAL
EOS FL: 0 %
LYMPHS FL: 32 %
MONOCYTE-MACROPHAGE-SEROUS FLUID: 9 % — AB (ref 50–90)
Neutrophil Count, Fluid: 59 % — ABNORMAL HIGH (ref 0–25)
OTHER CELLS FL: 0 %
Total Nucleated Cell Count, Fluid: 3400 cu mm — ABNORMAL HIGH (ref 0–1000)

## 2017-01-05 LAB — CBC
HEMATOCRIT: 28.3 % — AB (ref 36.0–46.0)
HEMOGLOBIN: 9.1 g/dL — AB (ref 12.0–15.0)
MCH: 31.5 pg (ref 26.0–34.0)
MCHC: 32.2 g/dL (ref 30.0–36.0)
MCV: 97.9 fL (ref 78.0–100.0)
Platelets: 247 10*3/uL (ref 150–400)
RBC: 2.89 MIL/uL — AB (ref 3.87–5.11)
RDW: 16.8 % — AB (ref 11.5–15.5)
WBC: 22.4 10*3/uL — AB (ref 4.0–10.5)

## 2017-01-05 LAB — LACTATE DEHYDROGENASE, PLEURAL OR PERITONEAL FLUID: LD FL: 314 U/L — AB (ref 3–23)

## 2017-01-05 LAB — CHOLESTEROL, TOTAL: Cholesterol: 97 mg/dL (ref 0–200)

## 2017-01-05 LAB — TROPONIN I
Troponin I: 0.06 ng/mL (ref <0.03)
Troponin I: 0.04 ng/mL (ref <0.03)

## 2017-01-05 LAB — PROCALCITONIN: Procalcitonin: 9.1 ng/mL

## 2017-01-05 LAB — LACTATE DEHYDROGENASE: LDH: 155 U/L (ref 98–192)

## 2017-01-05 LAB — PROTEIN, TOTAL: TOTAL PROTEIN: 8.1 g/dL (ref 6.5–8.1)

## 2017-01-05 MED ORDER — SODIUM CHLORIDE 0.9 % IV SOLN: 100.0000 mL | INTRAVENOUS | Status: DC | PRN

## 2017-01-05 MED ORDER — IPRATROPIUM-ALBUTEROL 0.5-2.5 (3) MG/3ML IN SOLN
3.0000 mL | Freq: Two times a day (BID) | RESPIRATORY_TRACT | Status: DC
Start: 2017-01-05 — End: 2017-01-07
  Administered 2017-01-05 – 2017-01-06 (×3): 3 mL via RESPIRATORY_TRACT
  Filled 2017-01-05 (×5): qty 3

## 2017-01-05 MED ORDER — MUPIROCIN 2 % EX OINT
1.0000 "application " | TOPICAL_OINTMENT | Freq: Two times a day (BID) | CUTANEOUS | Status: AC
  Administered 2017-01-05 – 2017-01-09 (×8): 1 via NASAL
  Filled 2017-01-05 (×3): qty 22

## 2017-01-05 MED ORDER — CHLORHEXIDINE GLUCONATE CLOTH 2 % EX PADS
6.0000 | MEDICATED_PAD | Freq: Every day | CUTANEOUS | Status: AC
  Administered 2017-01-05 – 2017-01-09 (×5): 6 via TOPICAL

## 2017-01-05 MED ORDER — HEPARIN SODIUM (PORCINE) 1000 UNIT/ML DIALYSIS: 1000.0000 [IU] | INTRAMUSCULAR | Status: DC | PRN

## 2017-01-05 MED ORDER — LIDOCAINE-PRILOCAINE 2.5-2.5 % EX CREA: 1.0000 "application " | TOPICAL_CREAM | CUTANEOUS | Status: DC | PRN

## 2017-01-05 MED ORDER — LIDOCAINE HCL (PF) 1 % IJ SOLN: 5.0000 mL | INTRAMUSCULAR | Status: DC | PRN

## 2017-01-05 MED ORDER — DARBEPOETIN ALFA 100 MCG/0.5ML IJ SOSY
PREFILLED_SYRINGE | INTRAMUSCULAR | Status: AC
  Administered 2017-01-05: 100 ug via INTRAVENOUS
  Filled 2017-01-05: qty 0.5

## 2017-01-05 MED ORDER — ORAL CARE MOUTH RINSE
15.0000 mL | Freq: Two times a day (BID) | OROMUCOSAL | Status: DC
  Administered 2017-01-05 – 2017-01-16 (×16): 15 mL via OROMUCOSAL

## 2017-01-05 MED ORDER — HEPARIN SODIUM (PORCINE) 1000 UNIT/ML DIALYSIS
20.0000 [IU]/kg | INTRAMUSCULAR | Status: DC | PRN
  Administered 2017-01-05: 1400 [IU] via INTRAVENOUS_CENTRAL

## 2017-01-05 MED ORDER — PIPERACILLIN-TAZOBACTAM 3.375 G IVPB
3.3750 g | Freq: Two times a day (BID) | INTRAVENOUS | Status: DC
  Administered 2017-01-05 – 2017-01-10 (×9): 3.375 g via INTRAVENOUS
  Filled 2017-01-05 (×11): qty 50

## 2017-01-05 MED ORDER — PENTAFLUOROPROP-TETRAFLUOROETH EX AERO
1.0000 | INHALATION_SPRAY | CUTANEOUS | Status: DC | PRN
Start: 2017-01-05 — End: 2017-01-05

## 2017-01-05 MED ORDER — VANCOMYCIN HCL IN DEXTROSE 750-5 MG/150ML-% IV SOLN
INTRAVENOUS | Status: AC
  Administered 2017-01-05: 750 mg via INTRAVENOUS
  Filled 2017-01-05: qty 150

## 2017-01-05 MED ORDER — DOXERCALCIFEROL 4 MCG/2ML IV SOLN
INTRAVENOUS | Status: AC
  Administered 2017-01-05: 2 ug via INTRAVENOUS
  Filled 2017-01-05: qty 2

## 2017-01-05 NOTE — Progress Notes (Signed)
Inpatient Diabetes Program Recommendations  AACE/ADA: New Consensus Statement on Inpatient Glycemic Control (2015)  Target Ranges:  Prepandial:   less than 140 mg/dL      Peak postprandial:   less than 180 mg/dL (1-2 hours)      Critically ill patients:  140 - 180 mg/dL   Results for Claudia Rich, Claudia Rich (MRN [id number]) as of 01/05/2017 10:43  Ref. Range 01/04/2017 07:37 01/04/2017 11:45 01/04/2017 17:23 01/04/2017 20:59 01/05/2017 07:34  Glucose-Capillary Latest Ref Range: 65 - 99 mg/dL 621198 (H) 308264 (H) 657297 (H) 253 (H) 375 (H)   Review of Glycemic Control  Diabetes history: DM 2 Outpatient Diabetes medications: Januvia 25 mg Daily Current orders for Inpatient glycemic control: Novolog Sensitive tid  Inpatient Diabetes Program Recommendations:   Glucose in 300's. Consider Low dose basal insulin, Levemir 6 units (0.1 units/kg)  Thanks,  Christena DeemShannon Ingrid Shifrin RN, MSN, [facility] Inpatient Diabetes Coordinator Team Pager [phone] (8a-5p)

## 2017-01-05 NOTE — Progress Notes (Addendum)
She is now s/p right thoracentesis.  This yielded about 300 ml of cloudy yellow pleural fluid.  Unfortunately was not able to fully evacuate the right chest. She has marked fibrinous changes w/ loculated fluid pocket on the right.   I spoke w/ her power of attorney Sheela Stacklivia Miles via phone. Typically in this situation we would offer either VATS or chest tube placement w/ consideration for pleural administered lytics. Given the patients advanced age as well as dementia she is NOT a candidate for VATS. Given the chronicity of the pleural space changes by US I would say that this has been there for some time. 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 Based on risk, age & above finding I recommend no further invasive interventions. Ms Marvis MoellerMiles understands this and is in agreement w/ this plan  Plan F/u pleural studies Cont abx Assess for oxygen needs.  No further invasive interventions Of note Ms Marvis MoellerMiles seems very aware of her aunts current poor health and is very focused on quality of life. The patient is listed as FULL CODE. I would strongly urge that we approach goals of care during this hospital visit.   Simonne MartinetPeter E Janiyha Montufar ACNP-BC [facility] Pulmonary/Critical Care Pager # [phone] OR # [phone] if no answer

## 2017-01-05 NOTE — Progress Notes (Signed)
[facility] Progress Note   Subjective: confused, on HD  Vitals:   01/05/17 1030 01/05/17 1100 01/05/17 1130 01/05/17 1141  BP: (!) 123/41 (!) 128/45 (!) 125/48   Pulse: 98 100 (!) 101   Resp:      Temp:      TempSrc:      SpO2:    97%  Weight:      Height:        Inpatient medications: . amLODipine  10 mg Oral QPM  . aspirin  81 mg Oral q morning - 10a  . atorvastatin  80 mg Oral QHS  . Chlorhexidine Gluconate Cloth  6 each Topical Q0600  . darbepoetin (ARANESP) injection - DIALYSIS  100 mcg Intravenous Q Tue-HD  . doxercalciferol  2 mcg Intravenous Q T,Th,Sa-HD  . enoxaparin (LOVENOX) injection  30 mg Subcutaneous Q24H  . feeding supplement (PRO-STAT SUGAR FREE 64)  30 mL Oral BID  . ferric citrate  420 mg Oral TID WC  . insulin aspart  0-9 Units Subcutaneous TID WC  . ipratropium-albuterol  3 mL Nebulization BID  . loratadine  10 mg Oral Daily  . mouth rinse  15 mL Mouth Rinse BID  . metoprolol tartrate  25 mg Oral BID  . multivitamin  1 tablet Oral QHS  . mupirocin ointment  1 application Nasal BID  . pantoprazole  40 mg Oral Daily  . sodium chloride flush  3 mL Intravenous Q12H   . sodium chloride    . sodium chloride    . piperacillin-tazobactam (ZOSYN)  IV    . vancomycin 750 mg (01/05/17 1113)   sodium chloride, sodium chloride, acetaminophen **OR** acetaminophen, albuterol, docusate sodium, heparin, heparin, lidocaine (PF), lidocaine-prilocaine, pentafluoroprop-tetrafluoroeth, polyethylene glycol  Exam: Awake, confused, perseverates w/ asked questions +JVD Chest bronchial BS 2/3 up on R, L side clear RRR no mrg Abd soft no ascites Ext no edema NF, confused and not following commands well  Dialysis: TTS at [facility] 3:45hrs, BFR 400, DFR A1.5, 2K/2.25Ca, Profile 4, EDW 67.5kg, AVG - Heparin 2000 unit bolus q HD - Hectoral IV q HD - Mircera q 2 weeks (just reordered, last given 11/24/16)      Assessment: 1. Hypoxemia/ RLL  infiltrate+effusion/ R chest pain - ^^WBC, on Rx for suspected PNA.  2. Volume - no vol excess on exam, at dry wt, unable to pull volume with HD today (BP's dropped and pt was poorly responsive) 3. ESRD HD TTS 4. AMS - prob metabolic on background of dementia 5. DM on po meds at home, SSI here 6. Anemia of CKD - starting ESA here  Plan - HD today   Vinson Moselle MD [facility] pager [phone]   01/05/2017, 12:08 PM    Recent Labs Lab 01/03/17 1719 01/04/17 0602 01/05/17 0452  NA 133* 136 132*  K 2.7* 3.2* 3.5  CL 93* 95* 94*  CO2 28 26 23   GLUCOSE 232* 223* 394*  BUN 25* 32* 51*  CREATININE 6.01* 6.07* 8.00*  CALCIUM 9.1 9.0 9.3  PHOS  --  4.0 4.2    Recent Labs Lab 01/04/17 0602 01/05/17 0452  ALBUMIN 2.4* 2.3*    Recent Labs Lab 01/03/17 1719 01/04/17 0602 01/05/17 0452  WBC 14.1* 18.6* 22.4*  HGB 8.8* 9.8* 9.1*  HCT 27.8* 30.6* 28.3*  MCV 99.6 99.4 97.9  PLT 201 200 247   Iron/TIBC/Ferritin/ %Sat    Component Value Date/Time   IRON 84 12/14/2014 1443  TIBC 216 (L) 12/14/2014 1443   FERRITIN 1,385 (H) 12/14/2014 1443   IRONPCTSAT 39 12/14/2014 1443

## 2017-01-05 NOTE — Progress Notes (Signed)
Family Medicine Teaching Service Daily Progress Note Intern Pager: [phone]  Patient name: Claudia Rich Medical record number: [id number] Date of birth: 1926-10-20 Age: 83 y.o. Gender: female  Primary Care Provider: Araceli Bouche, DO Consultants: Renal Code Status: Full  Pt Overview and Major Events to Date:  5/13 admit to FPTS  Assessment and Plan: Claudia Rich is a 83 y.o. female presenting with SOB, AMS . PMH is significant for ESRD on HD, dementia, CAD, T2DM, asthma.  SOB/Hypoxia:  Thought to be 2/2 HCAP, though with worsened dyspnea with activity yesterday so CTA was obtained.  No PE, however CTA showed RLL and RML collapse with large pulmonary effusion. Leukocytosis worsening at 22.4 from 18, procalcitonin worsening at 9.10.  Patient with inc work of breathing this AM but vitals stable.  - consult to Pulmonology to eval for pulmonary paracentesis - Continue to monitor  telemetry - VS per floor protocol - currently Vanc/ Cefepime per pharmacy (5/13>)   >>>transition cefepime to zosyn for broader coverage today - O2 prn saturations <92%. - f/u BCx obtained in ED (NGTD) - ABG stat - PT consult: SNF  AMS, improving: described by family as "not as perky".  h/o dementia.  - Consider CT head if develops neurologic deficits or does not improve with abx - PT >> SNF  Hypokalemia: K 2.7 >>> 3.5 s/p repletion . Mag 1.7 - monitor AM BMP  ESRD on HD: Tu,Th, Sat.  Electrolytes as above.  produces small amounts of urine. - c/s renal as noted above - pharmacy to renally dose Vanc  DM2:  11/2016 A1c 7.9. On Januvia at home. - sSSI while hospitalized - BG checks qAC   Pressure wound: per POA is sacral wound.  This was not well visualized on exam. - c/s Wound RN  GERD w/ h/o peptic ulcer: on Zantac 150mg  BID - will put on PPI, as this does not require renal dosing.   - If patient prefers to be on H2 blocker going forward, dose should be 1mg /kg/d of Zantac in setting of ESRD on  HD.  FEN/GI: SLIV, HH/Carb mod renal diet, Protonix Prophylaxis: Lovenox subq renal dosed   Disposition: Home pending transition to PO Abx  Subjective:  Patient seen in HD this AM. She is working hard to breath on 2.5L but satting fine, I increased oxygen to 5L for comfort. She is oriented to person but will not answer further orientation questions. Asked senior resident to also go see her this AM, PCCM has been called.  Objective: Temp:  [97.4 F (36.3 C)-98.5 F (36.9 C)] 98.1 F (36.7 C) (05/15 0416) Pulse Rate:  [86-97] 96 (05/15 0416) Resp:  [16-18] 18 (05/15 0416) BP: (134-152)/(45-58) 152/51 (05/15 0416) SpO2:  [95 %-96 %] 95 % (05/15 0416) Weight:  [68.5 kg (151 lb)] 68.5 kg (151 lb) (05/14 2105) Physical Exam:  General: patient appears uncomfortable, working to breath with subcostal retractions Cardiovascular: RRR, no m/r/g Respiratory: +gurgling sounds with  Crackles heard throughout lung fields, +tachypnic, + inc work of breathing, + subcostal retractions Abdomen: soft and nontender, nondistended Extremities: no LE edema  Laboratory:  Recent Labs Lab 01/03/17 1719 01/04/17 0602 01/05/17 0452  WBC 14.1* 18.6* 22.4*  HGB 8.8* 9.8* 9.1*  HCT 27.8* 30.6* 28.3*  PLT 201 200 247    Recent Labs Lab 01/03/17 1719 01/04/17 0602 01/05/17 0452  NA 133* 136 132*  K 2.7* 3.2* 3.5  CL 93* 95* 94*  CO2 28 26 23   BUN 25*  32* 51*  CREATININE 6.01* 6.07* 8.00*  CALCIUM 9.1 9.0 9.3  GLUCOSE 232* 223* 394*     Imaging/Diagnostic Tests: Dg Chest 2 View: 01/03/2017 FINDINGS: Moderate right pleural effusion. Associated right lower lobe opacity, likely compressive atelectasis. No frank interstitial edema.  No pneumothorax. The heart is normal in size. Vascular stent overlying the mediastinum. Old left rib fracture deformities.  IMPRESSION: Moderate right pleural effusion. Associated right lower lobe opacity, likely compressive atelectasis.   Ct Angio Chest Pe W Or Wo  Contrast  01/04/2017 IMPRESSION: Large right pleural effusion layering dependently with subtotal collapse of the right lung. Patchy pneumonia left lower lobe.  Left lower lobe bronchiectasis. 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 Aortic and coronary artery calcification. Multiple venous collaterals suggest stenosis of the the SVC at the SVC innominate junction. Innominate stent in place. Uncertain if this is patent.     Howard PouchFeng, Shalimar Mcclain, MD 01/05/2017, 8:26 AM PGY-1, [facility] FPTS Intern pager: [phone], text pages welcome

## 2017-01-05 NOTE — Progress Notes (Signed)
FPTS Interim Progress Note  S:Checked on patient this evening to assess respiratory status. She is breathing much more comfortably than earlier this AM. She is unable to answer questions on exam, just saying "mhm" to each question.  She is not in any apparent distress.  O: BP 134/61 (BP Location: Right Arm)   Pulse (!) 109   Temp 97.8 F (36.6 C) (Oral)   Resp 20   Ht 5\' 4"  (1.626 m)   Wt 67.1 kg (147 lb 14.9 oz)   SpO2 98%   BMI 25.39 kg/m   GEN: Comfortable, rests in bed, NAD PULM: +crackles appreciated diffusely, moves air well,  No increased work of breathing or accessory muscle use CARD: RRR no m/r/g ABD soft and nontender PSYCH repetitive speech, not oriented to person place or time   A/P: Continue to monitor respiratory status. Management as documented in daily progress note. Will continue to monitor mentation.  Howard PouchFeng, Kem Parcher, MD 01/05/2017, 10:22 PM PGY-1, [facility] Family Medicine Service pager [phone]

## 2017-01-05 NOTE — Progress Notes (Signed)
Pharmacy Antibiotic Note  Claudia Rich is a 83 y.o. female admitted on 01/03/2017 with AMS and SOB. Pt also noted to have a sore on sacram. Pt is ESRD on HD. Antibiotics are being changed to Zosyn + Vancomycin today.   Plan: 1. Continue Vancomycin 750 mg IV post HD-TTS 2. Start Zosyn 3.375g IV every 12 hours 3. Will continue to follow HD schedule/duration, culture results, LOT, and antibiotic de-escalation plans   Height: 5\' 4"  (162.6 cm) Weight: 125 lb (56.7 kg) IBW/kg (Calculated) : 54.7  Temp (24hrs), Avg:98 F (36.7 C), Min:97.5 F (36.4 C), Max:98.5 F (36.9 C)   Recent Labs Lab 01/03/17 1719 01/04/17 0602 01/05/17 0452  WBC 14.1* 18.6* 22.4*  CREATININE 6.01* 6.07* 8.00*    Estimated Creatinine Clearance: 4.3 mL/min (A) (by C-G formula based on SCr of 8 mg/dL (H)).    Allergies  Allergen Reactions  . Codeine Sulfate Other (See Comments)    "It made me sick"  . Meperidine Hcl Nausea And Vomiting    REACTION: vomiting    Antimicrobials this admission: 5/13 Cefepime >> 5/15 5/13 Vancomycin >> 5/15 Zosyn >>  Dose adjustments this admission: N/A  Microbiology results: 5/13 BCx >> 5/13 MRSA PCR >>  Thank you for allowing pharmacy to be a part of this patient's care.  Georgina PillionElizabeth Avyanna Spada, PharmD, BCPS Clinical Pharmacist Pager: [phone] Clinical phone for 01/05/2017 from 7a-3:30p: [phone] If after 3:30p, please call main pharmacy at: [phone] 01/05/2017 10:29 AM

## 2017-01-05 NOTE — Progress Notes (Signed)
MD stated that she would like patient to have ABG drawn before patient leaves HD. RT notified of this information. RT stated that they would be there ASAP. Will continue to monitor.

## 2017-01-05 NOTE — Progress Notes (Signed)
Patient came from HD. Rapid response RN notified and at bedside. Patient appears to be less alert and unable to communicate. Will continue to monitor.

## 2017-01-05 NOTE — Progress Notes (Signed)
MD notified X2. Will continue to contact.

## 2017-01-05 NOTE — NC FL2 (Signed)
[place] MEDICAID FL2 LEVEL OF CARE SCREENING TOOL     IDENTIFICATION  Patient Name: Claudia Rich Birthdate: 02-20-1927 Sex: female Admission Date (Current Location): 01/03/2017  [facility] [place]   Facility and Address:  [facility], [address], [place], [place] [address]      Provider Number: [id number]  Attending Physician Name and Address:  Nestor RampNeal, Sara L, MD  Relative Name and Phone Number:       Current Level of Care: Hospital Recommended Level of Care: Skilled Nursing Facility Prior Approval Number:    Date Approved/Denied:   PASRR Number: [id number]  Discharge Plan: SNF    Current Diagnoses: Patient Active Problem List   Diagnosis Date Noted  . Pressure injury of skin 01/04/2017  . ESRD on hemodialysis (HCC)   . HCAP (healthcare-associated pneumonia) 01/03/2017  . Pain of fifth toe 12/26/2016  . Chronic left hip pain 02/11/2016  . Trochanteric bursitis of left hip 01/17/2016  . Cutaneous horn 01/17/2016  . Balance problem 02/27/2015  . Preventative health care 12/14/2014  . DM2 (diabetes mellitus, type 2) (HCC) 12/14/2014  . Back pain 12/14/2014  . Hearing difficulty 10/10/2013  . Chronic restrictive lung disease 01/23/2013  . Pancreatic cyst 01/23/2013  . Dementia 07/02/2012  . CAD (coronary artery disease) 05/23/2012  . ESRD on dialysis (HCC) 05/07/2010  . Macrocytic anemia 03/20/2009  . Essential hypertension, benign 06/10/2007  . Hyperlipidemia 10/29/2006  . G E R D 10/29/2006    Orientation RESPIRATION BLADDER Height & Weight     Self  O2 (3L [place]) Continent Weight: 125 lb (56.7 kg) Height:  5\' 4"  (162.6 cm)  BEHAVIORAL SYMPTOMS/MOOD NEUROLOGICAL BOWEL NUTRITION STATUS      Continent Diet  AMBULATORY STATUS COMMUNICATION OF NEEDS Skin   Extensive Assist Verbally PU Stage and Appropriate Care   PU Stage 2 Dressing:  (located on buttocks, foam dressing )                   Personal Care  Assistance Level of Assistance  Bathing, Dressing Bathing Assistance: Maximum assistance   Dressing Assistance: Maximum assistance     Functional Limitations Info             SPECIAL CARE FACTORS FREQUENCY  PT (By licensed PT), OT (By licensed OT)     PT Frequency: 5/wk OT Frequency: 5/wk            Contractures      Additional Factors Info  Code Status, Allergies, Isolation Precautions, Insulin Sliding Scale Code Status Info: FULL Allergies Info: Codeine Sulfate, Meperidine Hcl   Insulin Sliding Scale Info: 3/day Isolation Precautions Info: MRSA     Current Medications (01/05/2017):  This is the current hospital active medication list Current Facility-Administered Medications  Medication Dose Route Frequency Provider Last Rate Last Dose  . 0.9 %  sodium chloride infusion  100 mL Intravenous PRN Julien NordmannStovall, Kathryn R, PA-C      . 0.9 %  sodium chloride infusion  100 mL Intravenous PRN Julien NordmannStovall, Kathryn R, PA-C      . acetaminophen (TYLENOL) tablet 650 mg  650 mg Oral Q8H PRN Delynn FlavinGottschalk, Ashly M, DO       Or  . acetaminophen (TYLENOL) suppository 650 mg  650 mg Rectal Q8H PRN Delynn FlavinGottschalk, Ashly M, DO      . albuterol (PROVENTIL) (2.5 MG/3ML) 0.083% nebulizer solution 2.5 mg  2.5 mg Nebulization Q4H PRN Nestor RampNeal, Sara L, MD  2.5 mg at 01/04/17 1212  . amLODipine (NORVASC) tablet 10 mg  10 mg Oral QPM Delynn Flavin M, DO   10 mg at 01/04/17 2139  . aspirin chewable tablet 81 mg  81 mg Oral q morning - 10a Delynn Flavin M, DO   81 mg at 01/04/17 1610  . atorvastatin (LIPITOR) tablet 80 mg  80 mg Oral QHS Delynn Flavin M, DO   80 mg at 01/04/17 2139  . ceFEPIme (MAXIPIME) 2 g in dextrose 5 % 50 mL IVPB  2 g Intravenous Q T,Th,Sat-1800 Ann Held, [facility]      . Chlorhexidine Gluconate Cloth 2 % PADS 6 each  6 each Topical Q0600 Nestor Ramp, MD   6 each at 01/05/17 0554  . Darbepoetin Alfa (ARANESP) injection 100 mcg  100 mcg Intravenous Q Tue-HD Stovall, Kathryn R,  PA-C      . docusate sodium (COLACE) capsule 100 mg  100 mg Oral Daily PRN Gottschalk, Ashly M, DO      . doxercalciferol (HECTOROL) injection 2 mcg  2 mcg Intravenous Q T,Th,Sa-HD Julien Nordmann, PA-C      . enoxaparin (LOVENOX) injection 30 mg  30 mg Subcutaneous Q24H Gottschalk, Kathie Rhodes M, DO   30 mg at 01/04/17 2139  . feeding supplement (PRO-STAT SUGAR FREE 64) liquid 30 mL  30 mL Oral BID Julien Nordmann, PA-C   30 mL at 01/04/17 2139  . ferric citrate (AURYXIA) tablet 420 mg  420 mg Oral TID WC Julien Nordmann, PA-C   420 mg at 01/04/17 2140  . heparin injection 1,000 Units  1,000 Units Dialysis PRN Julien Nordmann, PA-C      . heparin injection 1,400 Units  20 Units/kg Dialysis PRN Julien Nordmann, PA-C   1,400 Units at 01/05/17 9604  . insulin aspart (novoLOG) injection 0-9 Units  0-9 Units Subcutaneous TID WC Delynn Flavin M, DO   5 Units at 01/04/17 1757  . lidocaine (PF) (XYLOCAINE) 1 % injection 5 mL  5 mL Intradermal PRN Julien Nordmann, PA-C      . lidocaine-prilocaine (EMLA) cream 1 application  1 application Topical PRN Julien Nordmann, PA-C      . loratadine (CLARITIN) tablet 10 mg  10 mg Oral Daily Delynn Flavin M, DO   10 mg at 01/04/17 0942  . MEDLINE mouth rinse  15 mL Mouth Rinse BID Nestor Ramp, MD   15 mL at 01/05/17 0554  . metoprolol tartrate (LOPRESSOR) tablet 25 mg  25 mg Oral BID Delynn Flavin M, DO   25 mg at 01/04/17 2141  . multivitamin (RENA-VIT) tablet 1 tablet  1 tablet Oral QHS Nestor Ramp, MD   1 tablet at 01/04/17 2139  . mupirocin ointment (BACTROBAN) 2 % 1 application  1 application Nasal BID Nestor Ramp, MD      . pantoprazole (PROTONIX) EC tablet 40 mg  40 mg Oral Daily Delynn Flavin M, DO   40 mg at 01/04/17 5409  . pentafluoroprop-tetrafluoroeth (GEBAUERS) aerosol 1 application  1 application Topical PRN Stovall, Wille Celeste, PA-C      . polyethylene glycol (MIRALAX / GLYCOLAX) packet 17 g  17 g Oral Daily PRN  Delynn Flavin M, DO      . sodium chloride flush (NS) 0.9 % injection 3 mL  3 mL Intravenous Q12H Delynn Flavin M, DO   3 mL at 01/04/17 2141  . vancomycin (VANCOCIN) IVPB 750 mg/150 ml premix  750 mg Intravenous Q T,Th,Sa-HD Ann Held, [facility]         Discharge Medications: Please see discharge summary for a list of discharge medications.  Relevant Imaging Results:  Relevant Lab Results:   Additional Information SS#: [id number], received dialysis TTS at [facility]  [address], [place], [place]

## 2017-01-05 NOTE — Progress Notes (Signed)
Patient refused lab draw. MD notified. Patients niece, who is POA, stated that she agreed to have the patients labs drawn. MD put in the orders. Patient educated on the importance of these labs. Will continue to monitor.

## 2017-01-05 NOTE — Progress Notes (Signed)
MD notified regarding cancelled ABG order. MD stated that they would place the order. Also, MD stated that patient would be transferred to step down. Patient currently in HD. Will continue to monitor.

## 2017-01-05 NOTE — Procedures (Signed)
Thoracentesis Procedure Note  Pre-operative Diagnosis: right effusion  Post-operative Diagnosis: loculated right effusion   Indications: evaluation and evacuation of pleural fluid   Procedure Details  Consent: Informed consent was obtained. Risks of the procedure were discussed including: infection, bleeding, pain, pneumothorax.  Under sterile conditions the patient was positioned. Betadine solution and sterile drapes were utilized.  1% buffered lidocaine was used to anesthetize the  rib space which was evaluated via real time US. Fluid was obtained without any difficulties and minimal blood loss.  A dressing was applied to the wound and wound care instructions were provided.   Findings 300 ml of cloudy pleural fluid was obtained. A sample was sent to Pathology for cell counts, as well as for infection analysis. Also cytology.  The pleural space was not completely evacuated. Still laterally located loculated pocket of fluid noted. She has marked fibrinous changes extensively t/o her right pleural space.   Complications:  None; patient tolerated the procedure well.          Condition: stable  Plan A follow up chest x-ray was ordered. Bed Rest for 0 hours. Tylenol 650 mg. for pain.  Simonne MartinetPeter E Babcock ACNP-BC [facility] Pulmonary/Critical Care Pager # [phone] OR # [phone] if no answer  Alyson ReedyWesam G. Yacoub, M.D. [facility] Pulmonary/Critical Care Medicine. Pager: [phone]. After hours pager: [phone].

## 2017-01-05 NOTE — Significant Event (Signed)
Rapid Response Event Note  Overview: Time Called: 1247 Arrival Time: 1300 Event Type: Other (Comment), Respiratory  Initial Focused Assessment: Bedside RN called me stated that patient needed a SDU after HD treatment.  During HD, patient had some shortness of breath.  I arrived to see the patient at 1300.  Patient was moaning, did respond to pain, but somewhat lethargic.  Patient was not really talking but move all extremities to commands.  Lung sounds, diminished, + rhonchi, + thick secretions in her mouth. + pulses in all extremities. VS: SBP 120-130s, HR 100s, SpO2 > 93% on 2L, MAP > 65, afebrile.  PCCM NP was there to evaluate patient for pulmonology consult, patient was already ordered for a IR guided thoracentesis to RIGHT sided pleural effusion.  NP assess patient, spoke with family, obtained consent, and bedside thoracentesis was performed.   Interventions: -- Suctioned -- ABG ordered and obtained -- Assisted in Thoracentesis, patient tolerated well. -- CXR ordered for after procedure -- Pressure dressing applied to site  Plan of Care (if not transferred): -- Awaiting SDU bed, patient will transfer as soon as SDU bed is available.  -- Primary team MD was at bedside during procedure.  Event Summary: Name of Physician Notified: FMTS paged by primary RN and then I paged at 1340 at    Name of Consulting Physician Notified: Anders SimmondsPete Babcock NP    Outcome: Stayed in room and stabalized, will transfer once SDU becomes available.  Event End Time: 1500  Claudia Rich

## 2017-01-05 NOTE — Progress Notes (Signed)
SLP Cancellation Note  Patient Details Name: Claudia Rich MRN: [id number] DOB: 29-Aug-1926   Cancelled treatment:        Attempted to see for cognitive therapy. Rapid response with pt (respiratory difficulties in HD). Will see tomorrow.   [name], Claudia Rich 01/05/2017, 1:48 PM  [name] M.Ed [employer] Pager [phone]

## 2017-01-05 NOTE — Consult Note (Signed)
Name: Claudia Rich MRN: [id number] DOB: 31-Aug-1926    ADMISSION DATE:  01/03/2017 CONSULTATION DATE: 01/05/2017  REFERRING MD : Dr. Mosetta Putt  CHIEF COMPLAINT:  Dyspnea with increased WOB, Large R Pleural Effusion per CTA 5/14  BRIEF PATIENT DESCRIPTION:   SIGNIFICANT EVENTS  5/13 >> Admit to FPTS for SOB, AMS 5/14 >> CTA negative for PE, + for large right pleural effusion and LLL infiltrate  STUDIES:   CTA 5/14 Negative for PE Large right pleural effusion layering dependently with complete collapse of the right lower lobe, and near complete collapse of the right middle lobe. Dependent atelectasis in the upper lobe.  Patchy pneumonia left lower lobe.  Left lower lobe bronchiectasis.  Cultures: 01/04/2017:  Blood>> 01/05/2017:  RVP>>   HISTORY OF PRESENT ILLNESS:  83 year old female presenting with SOB and AMS. Significant PMH includes ESRD on HD T, TH, Sat, COPD, PVD, dementia, stroke, CAD, T2DM, asthma. CTA was done to RO PE, which was negative. However, CT confirmed LLL Infiltrate and Large Right pleural Effusion with RLL and RML collapse.Leukocytosis has worsened  To  22.4 from 18, procalcitonin worsening to 9.10. Appropriate ABX therapy was initiated per FPTS. Pt has increased WOB, with stable vitals. PCCM was consulted for consideration of possible thoracentesis of Right Pleural Effusion.Marland Kitchen  PAST MEDICAL HISTORY :   has a past medical history of Anemia; Colon polyps; COPD (chronic obstructive pulmonary disease) (HCC); Diabetes mellitus; Diverticulosis; ESRD on hemodialysis (HCC); GERD (gastroesophageal reflux disease); Glaucoma; Hammer toe; Hyperlipidemia; Hypertension; Internal hemorrhoids; Myocardial infarction [facility]) (10/2010); Peripheral vascular disease (HCC); and Stroke (HCC) (10/2010).  has a past surgical history that includes AV fistula repair (02/16/2011); AV fistula, brachiocephalic (09/24/2010); AV fistula placement; Cataract extraction; Abdominal hysterectomy; AV fistula  placement (08/14/2011); Insertion of dialysis catheter (09/28/2011); Eye surgery; AV fistula placement (12/07/2011); Thrombectomy and revision of arterioventous (av) goretex  graft (06/28/2012); shuntogram (Right, 09/28/2011); and shuntogram (N/A, 10/09/2011).   Prior to Admission medications   Medication Sig Start Date End Date Taking? Authorizing Provider  acetaminophen (TYLENOL) 500 MG tablet Take 500 mg by mouth every 6 (six) hours as needed for mild pain.   Yes [provider]  amLODipine (NORVASC) 10 MG tablet Take 1 tablet (10 mg total) by mouth every evening. 01/29/16  Yes Rumley, [place] N, DO  aspirin 81 MG chewable tablet Chew 81 mg by mouth every morning.    Yes [provider]  atorvastatin (LIPITOR) 80 MG tablet TAKE ONE TABLET AT BEDTIME. 04/28/16  Yes Rumley, [place] N, DO  AURYXIA 1 GM 210 MG(Fe) tablet Take 1 tablet by mouth 3 (three) times daily. 11/04/16  Yes [provider]  B Complex-C-Folic Acid (RENAL MULTIVITAMIN FORMULA PO) Take 1 tablet by mouth every evening.    Yes [provider]  docusate sodium (COLACE) 100 MG capsule Take 100 mg by mouth daily as needed for mild constipation.    Yes [provider]  JANUVIA 25 MG tablet TAKE 1 TABLET ONCE DAILY. 12/28/16  Yes Rumley, [place] N, DO  metoprolol tartrate (LOPRESSOR) 25 MG tablet TAKE 1 TABLET TWICE DAILY ON NON-DIALYSIS DAYS AND TAKE 1 TABLET DAILY ON DIALYSIS DAYS. 08/06/16  Yes Rumley, [place] N, DO  QC LORATADINE ALLERGY RELIEF 10 MG tablet TAKE 1 TABLET ONCE DAILY. 12/31/16  Yes Rumley, [place] N, DO  ranitidine (ZANTAC) 150 MG capsule Take 1 capsule (150 mg total) by mouth 2 (two) times daily. For stomach ulcer 10/30/15  Yes [place], [place], DO  VENTOLIN HFA 108 (90 Base) MCG/ACT inhaler USE 2 PUFFS ONCE A DAY AS NEEDED 10/20/16  Yes Rumley, [place] N, DO   Allergies  Allergen Reactions  . Codeine Sulfate Other (See Comments)    "It made me sick"  . Meperidine Hcl Nausea And  Vomiting    REACTION: vomiting    FAMILY HISTORY:  family history includes Atrial fibrillation in her sister; Breast cancer in her sister; Congestive Heart Failure in her sister; High Cholesterol in her sister; Hypertension in her sister; Pulmonary Hypertension in her sister; Stroke in her sister. SOCIAL HISTORY:  reports that she has never smoked. She has never used smokeless tobacco. She reports that she does not drink alcohol or use drugs.  REVIEW OF SYSTEMS:   Unable as patient is confused  SUBJECTIVE:  States she is " Here " when asked where she is, otherwise moaning and moving about in HD bed. Elderly female with dementia, confused, wearing nasal oxygen at 4 L [place], saturation of 98%, RR 23 VITAL SIGNS: Temp:  [97.5 F (36.4 C)-98.5 F (36.9 C)] 97.5 F (36.4 C) (05/15 0745) Pulse Rate:  [92-100] 100 (05/15 0930) Resp:  [16-28] 28 (05/15 0745) BP: (113-152)/(40-61) 115/40 (05/15 0930) SpO2:  [95 %-100 %] 100 % (05/15 0800) Weight:  [125 lb (56.7 kg)-151 lb (68.5 kg)] 125 lb (56.7 kg) (05/15 0745)  PHYSICAL EXAMINATION: General:  Elderly awake and confused female supine in bed in HD Neuro:  Confused, cannot answer orientation questions, MAE x 4, selectively follows commands HEENT: Normocephalic, atraumatic Cardiovascular: RRR, S1, S2, No RMG noted. Lungs:  Diminished per right side, expiratory wheezes noted, Diminished per L base, crackles throughout. Abdomen:  Soft and non-tender, non-distended, BS +,obese Musculoskeletal:  No obvious deformities noted Skin: Intact, without lesions or rash.   Recent Labs Lab 01/03/17 1719 01/04/17 0602 01/05/17 0452  NA 133* 136 132*  K 2.7* 3.2* 3.5  CL 93* 95* 94*  CO2 28 26 23   BUN 25* 32* 51*  CREATININE 6.01* 6.07* 8.00*  GLUCOSE 232* 223* 394*    Recent Labs Lab 01/03/17 1719 01/04/17 0602 01/05/17 0452  HGB 8.8* 9.8* 9.1*  HCT 27.8* 30.6* 28.3*  WBC 14.1* 18.6* 22.4*  PLT 201 200 247   Dg Chest 2 View  Result  Date: 01/03/2017 CLINICAL DATA:  Right chest pain, shortness of breath EXAM: CHEST  2 VIEW COMPARISON:  03/13/2014 FINDINGS: Moderate right pleural effusion. Associated right lower lobe opacity, likely compressive atelectasis. No frank interstitial edema.  No pneumothorax. The heart is normal in size. Vascular stent overlying the mediastinum. Old left rib fracture deformities. IMPRESSION: Moderate right pleural effusion. Associated right lower lobe opacity, likely compressive atelectasis. Electronically Signed   By: Charline BillsSriyesh  Krishnan M.D.   On: 01/03/2017 16:48   Ct Angio Chest Pe W Or Wo Contrast  Result Date: 01/04/2017 CLINICAL DATA:  Low oxygen saturation. Worsening shortness of breath. EXAM: CT ANGIOGRAPHY CHEST WITH CONTRAST TECHNIQUE: Multidetector CT imaging of the chest was performed using the standard protocol during bolus administration of intravenous contrast. Multiplanar CT image reconstructions and MIPs were obtained to evaluate the vascular anatomy. CONTRAST:  100 cc Isovue 370 COMPARISON:  Chest radiography 01/03/2017 FINDINGS: Cardiovascular: Pulmonary arterial opacification is moderate to good. There are no pulmonary emboli. There is aortic atherosclerosis but no aneurysm or dissection. There is extensive coronary artery calcification. Heart size is at the upper limits of normal. There is a small amount of pericardial fluid. Contrast injected in the right arm  travels through of multiple chest wall collaterals. There is an innominate vein stent in place. There could be thrombosis in that region. There appears to be stenosis at the end of the stent at the junction with the SVC. Below that, the SVC is patent and fed largely from the azygos vein. Mediastinum/Nodes: There are small mediastinal lymph nodes, likely reactive. No dominant nodes are identified. Lungs/Pleura: There is a large right pleural effusion layering dependently with complete collapse of the right lower lobe, and near complete  collapse of the right middle lobe. Dependent atelectasis in the upper lobe. No pleural fluid on the left. Mild patchy density in the left lower lobe consistent with bronchopneumonia. Bronchiectasis in the left lower lobe. Tracheomalacia and bronchomalacia with flattening of the distal trachea and narrowing of the mainstem bronchi. Upper Abdomen: Negative Musculoskeletal: Negative Review of the MIP images confirms the above findings. IMPRESSION: Large right pleural effusion layering dependently with subtotal collapse of the right lung. Patchy pneumonia left lower lobe.  Left lower lobe bronchiectasis. 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 Aortic and coronary artery calcification. Multiple venous collaterals suggest stenosis of the the SVC at the SVC innominate junction. Innominate stent in place. Uncertain if this is patent. Electronically Signed   By: Paulina Fusi M.D.   On: 01/04/2017 19:57    ASSESSMENT / PLAN:  Acute  Respiratory Failure, Hypoxia 2/2  LLL  CAP, Large Right Pleural Effusion, Underlying COPD ( From History)  Plan: Titrate oxygen to maintain saturation > 92 % Schedule Duonebs BID  Continue  prn albuterol nebs  Continue Vancomycin per pharmacy dosing Agree with addition of Zosyn Follow Blood Cultures/ Micro with ABX de-escalation as able Respiratory Viral Panel Sputum Culture as able CXR PRN Pt. High risk for complication with Thoracentesis, would consider optimizing removal of fluid through HD if possible.( Pt was kept even during today's HD treatment) Will check PT/INR  Aggressive Pulmonary Toilet, IS, Flutter Valve and early mobilization/ OOB to chair. Trend fever/ WBC  Thank you for consulting CCM. We  will follow along with you.  Bevelyn Ngo, AGACNP-BC [facility] Pulmonary/Critical Care Medicine Pager # [phone] Pager: [phone]  01/05/2017, 10:10 AM  Attending Note:  83 year old female with ESRD who presents to the hospital with AMS and SOB.  CT of  the chest was done that I reviewed myself and pleural effusion was noted.  Patient is confused but cooperative on exam with decreased BS on the right.  Requiring 2L [place].  PCCM consulted for thoracentesis.  Discussed with renal and PCCM-NP.  Pleural effusion: related to ESRD and likely cardiac disease, I see no signs to be concerned that this is an empyema.  - No thora  - Diurese via HD as able  Hypoxemia:  - Titrate O2 for sat of 88-92%.  - May need home O2.  Pulmonary HTN: 36 mmHg 5 years ago  - Repeat echo  SVC syndrome:  - Would not recommend surgical intervention on this frail 83 year old patient  - Recommend palliative approach.  PCCM will sign off, please call back if needed.  Patient seen and examined, agree with above note.  I dictated the care and orders written for this patient under my direction.  Alyson Reedy, MD [phone]

## 2017-01-05 NOTE — Progress Notes (Signed)
Transferred from 6 E to room 3w27. Arrived via stretcher, report received at 1830 from previous nurse.transfereed safely into bed, bed alarm activated. Vitals taken. No signs of distress. Patient is on contact precautions, positive for MRSA/droplet precautions

## 2017-01-05 NOTE — Progress Notes (Signed)
OT Cancellation Note    01/05/17 1000  OT Visit Information  Last OT Received On 01/05/17  Reason Eval/Treat Not Completed Patient at procedure or test/ unavailable  [facility] LPilary Lamone Ferrelli, OT/L  [phone] 01/05/2017

## 2017-01-05 NOTE — Progress Notes (Signed)
MD notified regarding diet order. MD gave verbal order that patient could be NPO except with sips with meds. Order placed. Will continue to monitor.

## 2017-01-06 ENCOUNTER — Inpatient Hospital Stay (HOSPITAL_COMMUNITY): Payer: Medicare HMO

## 2017-01-06 DIAGNOSIS — R4182 Altered mental status, unspecified: Secondary | ICD-10-CM

## 2017-01-06 DIAGNOSIS — G301 Alzheimer's disease with late onset: Secondary | ICD-10-CM

## 2017-01-06 DIAGNOSIS — Z515 Encounter for palliative care: Secondary | ICD-10-CM

## 2017-01-06 DIAGNOSIS — F0280 Dementia in other diseases classified elsewhere without behavioral disturbance: Secondary | ICD-10-CM

## 2017-01-06 DIAGNOSIS — Z992 Dependence on renal dialysis: Secondary | ICD-10-CM

## 2017-01-06 DIAGNOSIS — N186 End stage renal disease: Secondary | ICD-10-CM

## 2017-01-06 DIAGNOSIS — Z66 Do not resuscitate: Secondary | ICD-10-CM

## 2017-01-06 LAB — CBC
HCT: 25.1 % — ABNORMAL LOW (ref 36.0–46.0)
Hemoglobin: 8.1 g/dL — ABNORMAL LOW (ref 12.0–15.0)
MCH: 31.5 pg (ref 26.0–34.0)
MCHC: 32.3 g/dL (ref 30.0–36.0)
MCV: 97.7 fL (ref 78.0–100.0)
PLATELETS: 223 10*3/uL (ref 150–400)
RBC: 2.57 MIL/uL — ABNORMAL LOW (ref 3.87–5.11)
RDW: 17.2 % — AB (ref 11.5–15.5)
WBC: 17.6 10*3/uL — AB (ref 4.0–10.5)

## 2017-01-06 LAB — BLOOD GAS, ARTERIAL
Acid-Base Excess: 3.7 mmol/L — ABNORMAL HIGH (ref 0.0–2.0)
Acid-Base Excess: 1.7 mmol/L (ref 0.0–2.0)
BICARBONATE: 27.3 mmol/L (ref 20.0–28.0)
BICARBONATE: 25.6 mmol/L (ref 20.0–28.0)
DRAWN BY: 441261
Drawn by: 441371
FIO2: 28
O2 Content: 15 L/min
O2 Saturation: 90.4 %
O2 Saturation: 95.3 %
PATIENT TEMPERATURE: 98.6
PATIENT TEMPERATURE: 98
PCO2 ART: 38.5 mmHg (ref 32.0–48.0)
PO2 ART: 97 mmHg (ref 83.0–108.0)
pCO2 arterial: 38.8 mmHg (ref 32.0–48.0)
pH, Arterial: 7.462 — ABNORMAL HIGH (ref 7.350–7.450)
pH, Arterial: 7.436 (ref 7.350–7.450)
pO2, Arterial: 66.5 mmHg — ABNORMAL LOW (ref 83.0–108.0)

## 2017-01-06 LAB — RENAL FUNCTION PANEL
Albumin: 2.1 g/dL — ABNORMAL LOW (ref 3.5–5.0)
Anion gap: 14 (ref 5–15)
BUN: 21 mg/dL — ABNORMAL HIGH (ref 6–20)
CALCIUM: 8.8 mg/dL — AB (ref 8.9–10.3)
CHLORIDE: 96 mmol/L — AB (ref 101–111)
CO2: 25 mmol/L (ref 22–32)
CREATININE: 4.39 mg/dL — AB (ref 0.44–1.00)
GFR calc non Af Amer: 8 mL/min — ABNORMAL LOW (ref >60)
GFR, EST AFRICAN AMERICAN: 10 mL/min — AB (ref >60)
Glucose, Bld: 216 mg/dL — ABNORMAL HIGH (ref 65–99)
Phosphorus: 2.9 mg/dL (ref 2.5–4.6)
Potassium: 3.7 mmol/L (ref 3.5–5.1)
SODIUM: 135 mmol/L (ref 135–145)

## 2017-01-06 LAB — TROPONIN I: Troponin I: 0.03 ng/mL (ref <0.03)

## 2017-01-06 LAB — GLUCOSE, CAPILLARY
GLUCOSE-CAPILLARY: 256 mg/dL — AB (ref 65–99)
GLUCOSE-CAPILLARY: 139 mg/dL — AB (ref 65–99)
Glucose-Capillary: 235 mg/dL — ABNORMAL HIGH (ref 65–99)
Glucose-Capillary: 178 mg/dL — ABNORMAL HIGH (ref 65–99)

## 2017-01-06 LAB — PROTEIN, BODY FLUID (OTHER): TOTAL PROTEIN, BODY FLUID OTHER: 6.1 g/dL

## 2017-01-06 MED ORDER — LORAZEPAM 2 MG/ML IJ SOLN
0.5000 mg | Freq: Once | INTRAMUSCULAR | Status: DC | PRN
  Filled 2017-01-06: qty 1

## 2017-01-06 MED ORDER — LORAZEPAM 2 MG/ML IJ SOLN
0.2500 mg | Freq: Once | INTRAMUSCULAR | Status: AC | PRN
  Administered 2017-01-06: 0.25 mg via INTRAVENOUS

## 2017-01-06 MED ORDER — LORAZEPAM 2 MG/ML IJ SOLN
0.2500 mg | Freq: Once | INTRAMUSCULAR | Status: AC | PRN
  Administered 2017-01-06: 0.25 mg via INTRAVENOUS
  Filled 2017-01-06: qty 1

## 2017-01-06 NOTE — Consult Note (Signed)
   [facility]   01/06/2017  Claudia Rich 1927-06-24 [id number]    [facility]. Spoke with inpatient RNCM and chart reviewed.  Noted Palliative Medicine Team notes pending. [facility] if [facility]. Discussed this with inpatient RNCM.   Claudia NobleAtika Shardea Cwynar, MSN-Ed, RN,BSN [facility] Care [facility] Hospital Liaison [phone]

## 2017-01-06 NOTE — Progress Notes (Signed)
Clinical Social Worker met patient at bedside to offer support and discuss patient discharge plan. Patient was unable to do assessment as she was disoriented x4. CSW spoke to patient POA/Niece Minette Brine) via phone. Minette Brine stated that at this time she would like patient to discharge home with home health to follow. Minette Brine stated she has arranged her cousin to stay with the pateint at her home to help with her recovery. Minette Brine also stated that patient has a lot of support from church and her neighbors. At this time CSW is signing off as patient has no SW needs. RNCM has been notified of family decision for home health to follow. Please re-consult CSW if anything changes  Rhea Pink, MSW,  [place]

## 2017-01-06 NOTE — Progress Notes (Signed)
Repeat CXR today shows worsening R sided effusion/ consolidation and new basilar L base opacities.  There is no indication in my opinion of volume overload as the cause of her CXR findings/ pleural effusions.  She is not vol overloaded on exam, has no edema on CXR or CT, she has WBC > 20K and CT showed socked-in R lung consolidation with significant effusion.  Effusion was tapped, was fibrinous and loculated, fluid was cloudy and exudative.  Not sure this is anything other than lung infection. We tried to pull fluid in the first hour of her last dialysis and her BP dropped and she became poorly responsive. She is at her dry wt.  Will reassess in am.  Have spoken pt's niece/ POA and with CCM.    Vinson Moselleob Temperance Kelemen MD [facility] pgr [phone]   01/06/2017, 5:42 PM

## 2017-01-06 NOTE — Evaluation (Signed)
Occupational Therapy Evaluation Patient Details Name: Claudia Rich MRN: [id number] DOB: 1926/09/17 Today's Date: 01/06/2017    History of Present Illness 83 yo female admitted with AMS  LLL infiltrate and L R pleural effusion with RML/ RLL collapse. PMH: ESRD TTHSAT COPD, PVD, dementia, CAD. DM. asthma   Clinical Impression   PT admitted with AMS with R pleural effusion s/p  thoracentesis 01/05/17 . Pt currently with functional limitiations due to the deficits listed below (see OT problem list). PTA pt was independent with all adls and even cooked herself breakfast. Spoke with [name] POA via phone and best friend [name] in room. Both report patient is very far from baseline and will need SNF if does not progress soon with therapy. Recommend PALLIATIVE consult regarding care.  Pt will benefit from skilled OT to increase their independence and safety with adls and balance to allow discharge SNF  Pt upon sitting up states "fall" over and over again. Pt did fall on 01/05/17 unwitnessed per family.     Follow Up Recommendations  SNF    Equipment Recommendations  Hospital bed;Wheelchair cushion (measurements OT);Wheelchair (measurements OT)    Recommendations for Other Services Other (comment) (Palliative)     Precautions / Restrictions Precautions Precautions: Fall Precaution Comments: Watch O2 sats closely      Mobility Bed Mobility Overal bed mobility: Needs Assistance Bed Mobility: Supine to Sit     Supine to sit: Mod assist Sit to supine: Mod assist   General bed mobility comments: pt needed physical max (A) to get supine in bed again with HOB elevated for long sitting position  Transfers                 General transfer comment[facility]    Balance Overall balance assessment: History of Falls (fell on 01/01/17)                                         ADL either performed or assessed with clinical judgement   ADL Overall ADL's  : Needs assistance/impaired     Grooming: Total assistance   Upper Body Bathing: Total assistance   Lower Body Bathing: Total assistance                         General ADL Comments: pt currently does not follow commands. resistant to (A) with movement more of a startle response. pt without visual attention to task      Vision Patient Visual Report: Other (comment) Additional Comments: pt with static stare and does not response to threat in R eye. pt will blink delayed to the L eye     Perception     Praxis      Pertinent Vitals/Pain Pain Assessment: No/denies pain     Hand Dominance Right   Extremity/Trunk Assessment Upper Extremity Assessment Upper Extremity Assessment: Generalized weakness   Lower Extremity Assessment Lower Extremity Assessment: Generalized weakness   Cervical / Trunk Assessment Cervical / Trunk Assessment: Kyphotic   Communication Communication Communication: Other (comment)   Cognition Arousal/Alertness: Awake/alert Behavior During Therapy: Restless Overall Cognitive Status: Impaired/Different from baseline                                 General Comments: pt does not follow any commands ,  does not blink to threat on R eye, does respond to name call for 3 seconds with eye contact in direction of the sound. static stare   General Comments  risk for skin break down due to decr mobility     Exercises     Shoulder Instructions      Home Living Family/patient expects to be discharged to:: Skilled nursing facility Living Arrangements: Alone Available Help at Discharge: Family;Personal care attendant Type of Home: House Home Access: Stairs to enter [employer] of Steps:  (1) Entrance Stairs-Rails: None Home Layout: One level     Bathroom Shower/Tub: Chief Strategy Officer: Standard Bathroom Accessibility: Yes   Home Equipment: Walker - 2 wheels;Hand held shower head;Shower seat;Bedside  commode;Cane - single point   Additional Comments: Will need to verify the above information  Lives With: Alone    Prior Functioning/Environment Level of Independence: Independent                 OT Problem List: Decreased strength;Decreased activity tolerance;Impaired balance (sitting and/or standing);Decreased safety awareness;Decreased knowledge of use of DME or AE;Decreased knowledge of precautions;Cardiopulmonary status limiting activity;Decreased cognition;Decreased coordination;Impaired vision/perception      OT Treatment/Interventions: Self-care/ADL training;Therapeutic exercise;Neuromuscular education;DME and/or AE instruction;Therapeutic activities;Cognitive remediation/compensation;Visual/perceptual remediation/compensation;Patient/family education;Balance training    OT Goals(Current goals can be found in the care plan section) Acute Rehab OT Goals Patient Stated Goal: none  OT Goal Formulation: Patient unable to participate in goal setting Time For Goal Achievement: 01/20/17 Potential to Achieve Goals: Good  OT Frequency: Min 2X/week   Barriers to D/C: Decreased caregiver support  has an aide on HD days for AM and PM assistance for 3 hours total        Co-evaluation              AM-PAC PT "6 Clicks" Daily Activity     Outcome Measure Help from another person eating meals?: Total Help from another person taking care of personal grooming?: Total Help from another person toileting, which includes using toliet, bedpan, or urinal?: Total Help from another person bathing (including washing, rinsing, drying)?: Total Help from another person to put on and taking off regular upper body clothing?: Total Help from another person to put on and taking off regular lower body clothing?: Total 6 Click Score: 6   End of Session Nurse Communication: Mobility status;Precautions  Activity Tolerance: Other (comment) (AMS) Patient left: in bed;with call [place]/phone within  reach;with bed alarm set;with family/visitor present  OT Visit Diagnosis: Unsteadiness on feet (R26.81)                Time: 9604-5409 OT Time Calculation (min): 33 min Charges:  OT General Charges $OT Visit: 1 Procedure OT Evaluation $OT Eval High Complexity: 1 Procedure G-Codes:      Mateo Flow   OTR/L Pager: [phone] Office: [phone] .   Boone Master B 01/06/2017, 2:13 PM

## 2017-01-06 NOTE — Progress Notes (Signed)
Orders written for IV ativan prior to CT scan.  Dr. Cathlean CowerMikell up to floor.  Asked MD if she felt it was safe to sedate patient for with current Respiratory status.  MD states at this time they want the CT scan and sedation was necessary.  Colman Caterarpley, Sharise Lippy Danielle

## 2017-01-06 NOTE — Evaluation (Signed)
Clinical/Bedside Swallow Evaluation Patient Details  Name: Claudia Rich MRN: [id number] Date of Birth: Dec 31, 1926  Today's Date: 01/06/2017 Time: SLP Start Time (ACUTE ONLY): 1623 SLP Stop Time (ACUTE ONLY): 1648 SLP Time Calculation (min) (ACUTE ONLY): 25 min  Past Medical History:  Past Medical History:  Diagnosis Date  . Anemia   . Colon polyps   . COPD (chronic obstructive pulmonary disease) (HCC)   . Diabetes mellitus   . Diverticulosis   . ESRD on hemodialysis (HCC)    TTS at [facility]. Started HD   . GERD (gastroesophageal reflux disease)   . Glaucoma   . Hammer toe   . Hyperlipidemia   . Hypertension   . Internal hemorrhoids   . Myocardial infarction (HCC) 10/2010  . Peripheral vascular disease (HCC)   . Stroke [facility]) 10/2010   Has some memory impairment per DTR   Past Surgical History:  Past Surgical History:  Procedure Laterality Date  . ABDOMINAL HYSTERECTOMY    . AV FISTULA PLACEMENT    . AV FISTULA PLACEMENT  08/14/2011   Procedure: ARTERIOVENOUS (AV) FISTULA CREATION;  Surgeon: Chuck Hint, MD;  Location[facility] OR;  Service: Vascular;  Laterality: Right;  Right Arm Arteriovenous Fistula Creation/ contact pt's niece,[name] for any information to be relayed to pt: # [phone]  . AV FISTULA PLACEMENT  12/07/2011   Procedure: INSERTION OF ARTERIOVENOUS (AV) GORE-TEX GRAFT ARM;  Surgeon: Sherren Kerns, MD;  Location: [facility] OR;  Service: Vascular;  Laterality: Left;  . AV FISTULA PLACEMENT, BRACHIOCEPHALIC  09/24/2010   Left  . AV FISTULA REPAIR  02/16/2011   Revision of left brachiocephalic arteriovenous fistula with patch angioplasty of left brachiocephalic fistula  . CATARACT EXTRACTION    . EYE SURGERY    . INSERTION OF DIALYSIS CATHETER  09/28/2011   Procedure: INSERTION OF DIALYSIS CATHETER;  Surgeon: Sherren Kerns, MD;  Location: [facility] OR;  Service: Vascular;  Laterality: Left;  Insertion of Dialysis Catheter left IJ  . SHUNTOGRAM Right 09/28/2011   Procedure: SHUNTOGRAM;  Surgeon: Chuck Hint, MD;  Location: [facility] CATH LAB;  Service: Cardiovascular;  Laterality: Right;  . SHUNTOGRAM N/A 10/09/2011   Procedure: Betsey Amen;  Surgeon: Sherren Kerns, MD;  Location: [facility] CATH LAB;  Service: Cardiovascular;  Laterality: N/A;  . THROMBECTOMY AND REVISION OF ARTERIOVENTOUS (AV) GORETEX  GRAFT  06/28/2012   Procedure: THROMBECTOMY AND REVISION OF ARTERIOVENTOUS (AV) GORETEX  GRAFT;  Surgeon: Chuck Hint, MD;  Location: [facility] OR;  Service: Vascular;  Laterality: Left;   HPI:  83 yo female admitted with AMS LLL infiltrate and L R pleural effusion with RML/ RLL collapse. PMH: ESRD TTHSAT COPD, PVD, dementia, CAD. DM. asthma. Thoracentesis 5/15. Significant deterioration from baseline function. Palliative medicine meeting with family is pending.    Assessment / Plan / Recommendation Clinical Impression  Pt with acute dysphagia after change in MS.  She was alert, staring ahead, not following commands.  With encouragement, hand-over-hand total assist with feeding, pt began to recognize approach of spoon/cup and orally manipulate POs.  She consumed small sips of water; several tspns of puree with intermittent oral holding; achieved a consistent swallow with no overt s/s of aspiration.  No verbalizatons; no eye contact could be elicited.  Results of head CT not yet available.  For tonight, allow sips of water, meds crushed in puree.  SLP will f/u next date for GOC after palliative care meeting.  D/W RN.  SLP Visit Diagnosis: Dysphagia, unspecified (R13.10)  Aspiration Risk  Moderate aspiration risk    Diet Recommendation   sips of water; meds crushed in puree  Medication Administration: Crushed with puree    Other  Recommendations Oral Care Recommendations: Oral care QID   Follow up Recommendations  (tba)      Frequency and Duration min 2x/week  1 week       Prognosis Prognosis for Safe Diet Advancement: Fair      Swallow  Study   General Date of Onset: 01/03/17 HPI: 83 yo female admitted with AMS LLL infiltrate and L R pleural effusion with RML/ RLL collapse. PMH: ESRD TTHSAT COPD, PVD, dementia, CAD. DM. asthma. Thoracentesis 5/15. Significant deterioration from baseline function. Palliative medicine meeting with family is pending.  Type of Study: Bedside Swallow Evaluation Previous Swallow Assessment: no Diet Prior to this Study: NPO Temperature Spikes Noted: No Respiratory Status: Nasal cannula History of Recent Intubation: No Behavior/Cognition: Alert;Doesn't follow directions Oral Cavity Assessment: Within Functional Limits Oral Care Completed by SLP: No Self-Feeding Abilities: Total assist Patient Positioning: Upright in bed Volitional Cough: Cognitively unable to elicit Volitional Swallow: Unable to elicit    Oral/Motor/Sensory Function Overall Oral Motor/Sensory Function: Other (comment) (symmetric at baseline)   Ice Chips Ice chips: Impaired Presentation: Spoon (pt unable to remove from spoon)   Thin Liquid Thin Liquid: Within functional limits Presentation: Cup    Nectar Thick Nectar Thick Liquid: Not tested   Honey Thick Honey Thick Liquid: Not tested   Puree Puree: Impaired Presentation: Spoon Oral Phase Impairments: Reduced labial seal Oral Phase Functional Implications: Oral holding   Solid   GO   Solid: Not tested        Blenda Mountsouture, Amazing Cowman Laurice 01/06/2017,5:46 PM  Marchelle FolksAmanda L. Samson Fredericouture, [place]MA CCC/SLP Pager [phone]

## 2017-01-06 NOTE — Progress Notes (Signed)
Patient in respiratory distress, 02 saturation dropped as low as 79%, applied 100 percent nonrebreather  sats increased at 100%, Respiratory obtained ABG's, CXR done at bedside. Family at bedside. CT Scan of head ordered.

## 2017-01-06 NOTE — Consult Note (Signed)
Consultation Note Date: 01/06/2017   Patient Name: Claudia Rich  DOB: 04/18/1927  MRN: [id number]  Age / Sex: 83 y.o., female  PCP: Araceli Bouche, DO Referring Physician: Nestor Ramp, MD  Reason for Consultation: Establishing goals of care and Psychosocial/spiritual support  HPI/Patient Profile: 83 y.o. female   admitted on 01/03/2017 with altered mental status from home where she lives alone.  PMH significant for ESRD on HD, COPD, PVD, mild dementia, DM.    Family report a witnessed fall on Friday onto her bottom/ knees but did not hit her head. They report change in mental status.    Admitted for workup.    R pleural effusion s/p thoracentesis 01/05/17, await cytology  Multiple co-morbid ites, long term poor prognosis.   Family face treatment option decision, advanced directive decision and anticipatory care needs.      Clinical Assessment and Goals of Care:   This NP Lorinda Creed reviewed medical records, received report from team, assessed the patient and then meet at the patient's bedside and spoke to her niece/HPOA [name]  By telephone [phone]  to discuss diagnosis, prognosis, GOC, EOL wishes disposition and options.  A detailed discussion was had today regarding advanced directives.  Concepts specific to code status, artifical feeding and hydration, continued IV antibiotics, ability to continue dialysis and rehospitalization was had.  The difference between a aggressive medical intervention path  and a palliative comfort care path for this patient at this time was had.  Values and goals of care important to patient and family were attempted to be elicited.    Questions and concerns addressed.   Family encouraged to call with questions or concerns.  PMT will continue to support holistically.   HCPOA/ [name]     SUMMARY OF RECOMMENDATIONS    Code Status/Advance Care  Planning  DNR- documented today   Palliative Prophylaxis:   Aspiration, Bowel Regimen, Delirium Protocol, Frequent Pain Assessment and Oral Care  Additional Recommendations (Limitations, Scope, Preferences):  Full Scope Treatment  Psycho-social/Spiritual:   Desire for further Chaplaincy support:no  Additional Recommendations: Education on Hospice  Prognosis:   Unable to determine  Discharge Planning: To Be Determined      Primary Diagnoses: Present on Admission: . HCAP (healthcare-associated pneumonia) . Back pain . Balance problem . CAD (coronary artery disease) . Essential hypertension, benign . Dementia   I have reviewed the medical record, interviewed the patient and family, and examined the patient. The following aspects are pertinent.  Past Medical History:  Diagnosis Date  . Anemia   . Colon polyps   . COPD (chronic obstructive pulmonary disease) (HCC)   . Diabetes mellitus   . Diverticulosis   . ESRD on hemodialysis (HCC)    TTS at [facility]. Started HD   . GERD (gastroesophageal reflux disease)   . Glaucoma   . Hammer toe   . Hyperlipidemia   . Hypertension   . Internal hemorrhoids   . Myocardial infarction (HCC) 10/2010  . Peripheral vascular disease (HCC)   .  Stroke [facility]) 10/2010   Has some memory impairment per DTR   Social History   Social History  . Marital status: Widowed    Spouse name: N/A  . Number of children: N/A  . Years of education: N/A   Social History Main Topics  . Smoking status: Never Smoker  . Smokeless tobacco: Never Used  . Alcohol use No  . Drug use: No  . Sexual activity: No   Other Topics Concern  . None   Social History Narrative   Niece [name] is patient's POA and main medical caregiver.   Niece lives in [place] and has access to Mychart pending (as of June 2014)   Family History  Problem Relation Age of Onset  . Hypertension Sister   . Atrial fibrillation Sister   . Congestive Heart Failure  Sister   . High Cholesterol Sister   . Breast cancer Sister   . Pulmonary Hypertension Sister   . Stroke Sister   . Anesthesia problems Neg Hx    Scheduled Meds: . amLODipine  10 mg Oral QPM  . aspirin  81 mg Oral q morning - 10a  . atorvastatin  80 mg Oral QHS  . Chlorhexidine Gluconate Cloth  6 each Topical Q0600  . darbepoetin (ARANESP) injection - DIALYSIS  100 mcg Intravenous Q Tue-HD  . doxercalciferol  2 mcg Intravenous Q T,Th,Sa-HD  . enoxaparin (LOVENOX) injection  30 mg Subcutaneous Q24H  . feeding supplement (PRO-STAT SUGAR FREE 64)  30 mL Oral BID  . ferric citrate  420 mg Oral TID WC  . insulin aspart  0-9 Units Subcutaneous TID WC  . ipratropium-albuterol  3 mL Nebulization BID  . loratadine  10 mg Oral Daily  . mouth rinse  15 mL Mouth Rinse BID  . metoprolol tartrate  25 mg Oral BID  . multivitamin  1 tablet Oral QHS  . mupirocin ointment  1 application Nasal BID  . pantoprazole  40 mg Oral Daily  . sodium chloride flush  3 mL Intravenous Q12H   Continuous Infusions: . piperacillin-tazobactam (ZOSYN)  IV Stopped (01/06/17 0730)  . vancomycin 750 mg (01/05/17 1113)   PRN Meds:.acetaminophen **OR** acetaminophen, albuterol, docusate sodium, polyethylene glycol Medications Prior to Admission:  Prior to Admission medications   Medication Sig Start Date End Date Taking? Authorizing Provider  acetaminophen (TYLENOL) 500 MG tablet Take 500 mg by mouth every 6 (six) hours as needed for mild pain.   Yes [provider]  amLODipine (NORVASC) 10 MG tablet Take 1 tablet (10 mg total) by mouth every evening. 01/29/16  Yes Rumley, [place] N, DO  aspirin 81 MG chewable tablet Chew 81 mg by mouth every morning.    Yes [provider]  atorvastatin (LIPITOR) 80 MG tablet TAKE ONE TABLET AT BEDTIME. 04/28/16  Yes Rumley, [place] N, DO  AURYXIA 1 GM 210 MG(Fe) tablet Take 1 tablet by mouth 3 (three) times daily. 11/04/16  Yes [provider]  B  Complex-C-Folic Acid (RENAL MULTIVITAMIN FORMULA PO) Take 1 tablet by mouth every evening.    Yes [provider]  docusate sodium (COLACE) 100 MG capsule Take 100 mg by mouth daily as needed for mild constipation.    Yes [provider]  JANUVIA 25 MG tablet TAKE 1 TABLET ONCE DAILY. 12/28/16  Yes Rumley, [place] N, DO  metoprolol tartrate (LOPRESSOR) 25 MG tablet TAKE 1 TABLET TWICE DAILY ON NON-DIALYSIS DAYS AND TAKE 1 TABLET DAILY ON DIALYSIS DAYS. 08/06/16  Yes  Rumley, [place] N, DO  QC LORATADINE ALLERGY RELIEF 10 MG tablet TAKE 1 TABLET ONCE DAILY. 12/31/16  Yes Rumley, [place] N, DO  ranitidine (ZANTAC) 150 MG capsule Take 1 capsule (150 mg total) by mouth 2 (two) times daily. For stomach ulcer 10/30/15  Yes Rumley, [place] N, DO  VENTOLIN HFA 108 [phone](90 Base) MCG/ACT inhaler USE 2 PUFFS ONCE A DAY AS NEEDED 10/20/16  Yes Rumley, [place] N, DO   Allergies  Allergen Reactions  . Codeine Sulfate Other (See Comments)    "It made me sick"  . Meperidine Hcl Nausea And Vomiting    REACTION: vomiting   Review of Systems  Unable to perform ROS: Mental status change    Physical Exam  Constitutional: She appears lethargic. She appears cachectic. She appears ill.  Cardiovascular: Tachycardia present.   Pulmonary/Chest: She has decreased breath sounds in the right lower field and the left lower field.  Neurological: She appears lethargic.  Skin: Skin is warm and dry.    Vital Signs: BP (!) 126/54 (BP Location: Left Arm)   Pulse (!) 102   Temp 98 F (36.7 C) (Axillary)   Resp 20   Ht 5\' 4"  (1.626 m)   Wt 68.7 kg (151 lb 7.3 oz)   SpO2 97%   BMI 26.00 kg/m  Pain Assessment: No/denies pain   Pain Score: 0-No pain   SpO2: SpO2: 97 % O2 Device:SpO2: 97 % O2 Flow Rate: .O2 Flow Rate (L/min): 5 L/min  IO: Intake/output summary:  Intake/Output Summary (Last 24 hours) at 01/06/17 1153 Last data filed at 01/06/17 0600  Gross per 24 hour  Intake               53 ml   Output                0 ml  Net               53 ml    LBM: Last BM Date: 01/02/17 Baseline Weight: Weight: 66.7 kg (147 lb) Most recent weight: Weight: 68.7 kg (151 lb 7.3 oz)      Palliative Assessment/Data: 40 % at best   Discussed with Dr Chanetta Marshallimberlake   This NP will f/u in the morning with family for further clarification of GOCs   Time In: 1045 Time Out: 1200 Time Total: 75 min Greater than 50%  of this time was spent counseling and coordinating care related to the above assessment and plan.  Signed by: Lorinda CreedLARACH, Annai Heick, NP   Please contact Palliative Medicine Team phone at [phone] for questions and concerns.  For individual provider: See Loretha StaplerAmion

## 2017-01-06 NOTE — Progress Notes (Signed)
Patient scheduled for CT SCAN, left floor around 1330 for CT SCAN, not completed because patient could not lay still for the test. Order given for 0.5mg  IV Ativan, continued to be alert, notified CT that nurse will call when patient is sedated. Notified Dr., received second order for another dose of ativan 0.5 mg which was given. BP 130/36, map 71, spo2 100 % on 5 liters via nasal cannula. Heart rate 103 bpm. Continuing to monitor

## 2017-01-06 NOTE — Progress Notes (Signed)
Name: Claudia Rich MRN: [id number] DOB: 1927/04/05    ADMISSION DATE:  01/03/2017 CONSULTATION DATE: 01/05/2017  REFERRING MD : Dr. Mosetta PuttFeng  CHIEF COMPLAINT:  Dyspnea with increased WOB, Large R Pleural Effusion per CTA 5/14  BRIEF PATIENT DESCRIPTION: 83 y/o F admitted with SOB / AMS.  PMH of ESRD on HD T, TH, Sat, COPD, PVD, dementia, stroke, CAD, T2DM, asthma. CTA chest negative for PE but showed LLL infiltrate and large right pleural effusion with RML/RLL collapse.  S/P R thora 5/15.    SUBJECTIVE:  AMS overnight but stable respiratory status overnight.  VITAL SIGNS: Temp:  [97.3 F (36.3 C)-98.3 F (36.8 C)] 97.3 F (36.3 C) (05/16 1015) Pulse Rate:  [97-110] 103 (05/16 1015) Resp:  [18-30] 25 (05/16 1015) BP: (92-156)/(41-105) 119/52 (05/16 1015) SpO2:  [94 %-100 %] 99 % (05/16 1015) FiO2 (%):  [2 %] 2 % (05/15 2008) Weight:  [147 lb 14.9 oz (67.1 kg)-151 lb 7.3 oz (68.7 kg)] 151 lb 7.3 oz (68.7 kg) (05/16 0500)  PHYSICAL EXAMINATION: General:  Chronically ill appearing female, NAD HEENT: [place]/AT, PERRL, EOM-I and MMM PSY: confused but stable Neuro: Awake but confused, exam is non-focal CV: RRR, Nl S1/S2 and -M/G/G PULM: Bibasilar crackles, decreased BS on the RLL GI: Soft, NT, ND and +BS Extremities: -edema and -tenderness Skin: Intact   Recent Labs Lab 01/04/17 0602 01/05/17 0452 01/06/17 0443  NA 136 132* 135  K 3.2* 3.5 3.7  CL 95* 94* 96*  CO2 26 23 25   BUN 32* 51* 21*  CREATININE 6.07* 8.00* 4.39*  GLUCOSE 223* 394* 216*    Recent Labs Lab 01/04/17 0602 01/05/17 0452 01/06/17 0443  HGB 9.8* 9.1* 8.1*  HCT 30.6* 28.3* 25.1*  WBC 18.6* 22.4* 17.6*  PLT 200 247 223   Ct Angio Chest Pe W Or Wo Contrast  Result Date: 01/04/2017 CLINICAL DATA:  Low oxygen saturation. Worsening shortness of breath. EXAM: CT ANGIOGRAPHY CHEST WITH CONTRAST TECHNIQUE: Multidetector CT imaging of the chest was performed using the standard protocol during bolus  administration of intravenous contrast. Multiplanar CT image reconstructions and MIPs were obtained to evaluate the vascular anatomy. CONTRAST:  100 cc Isovue 370 COMPARISON:  Chest radiography 01/03/2017 FINDINGS: Cardiovascular: Pulmonary arterial opacification is moderate to good. There are no pulmonary emboli. There is aortic atherosclerosis but no aneurysm or dissection. There is extensive coronary artery calcification. Heart size is at the upper limits of normal. There is a small amount of pericardial fluid. Contrast injected in the right arm travels through of multiple chest wall collaterals. There is an innominate vein stent in place. There could be thrombosis in that region. There appears to be stenosis at the end of the stent at the junction with the SVC. Below that, the SVC is patent and fed largely from the azygos vein. Mediastinum/Nodes: There are small mediastinal lymph nodes, likely reactive. No dominant nodes are identified. Lungs/Pleura: There is a large right pleural effusion layering dependently with complete collapse of the right lower lobe, and near complete collapse of the right middle lobe. Dependent atelectasis in the upper lobe. No pleural fluid on the left. Mild patchy density in the left lower lobe consistent with bronchopneumonia. Bronchiectasis in the left lower lobe. Tracheomalacia and bronchomalacia with flattening of the distal trachea and narrowing of the mainstem bronchi. Upper Abdomen: Negative Musculoskeletal: Negative Review of the MIP images confirms the above findings. IMPRESSION: Large right pleural effusion layering dependently with subtotal collapse of the right lung. Patchy  pneumonia left lower lobe.  Left lower lobe bronchiectasis. 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 Aortic and coronary artery calcification. Multiple venous collaterals suggest stenosis of the the SVC at the SVC innominate junction. Innominate stent in place. Uncertain if this is patent.  Electronically Signed   By: Paulina Fusi M.D.   On: 01/04/2017 19:57   Dg Chest Port 1 View  Result Date: 01/05/2017 CLINICAL DATA:  Status post right thoracentesis. EXAM: PORTABLE CHEST 1 VIEW COMPARISON:  Jan 03, 2017 FINDINGS: No pneumothorax after thoracentesis. The right-sided pleural effusion remains trauma action little larger when compared to Jan 03, 2017. Effusion patchy in left base has worsened as well. No other interval changes. IMPRESSION: No pneumothorax after thoracentesis. The bilateral pleural effusions, right greater than left, are larger when compared to Jan 03, 2017. Electronically Signed   By: Gerome Sam III M.D   On: 01/05/2017 14:54   SIGNIFICANT EVENTS  5/13  Admit to FPTS for SOB, AMS 5/14  CTA negative for PE, + for large right pleural effusion and LLL infiltrate  STUDIES:  CTA Chest 5/14 >> Negative for PE, Large right pleural effusion layering dependently with complete collapse of the right lower lobe, and near complete collapse of the right middle lobe. Dependent atelectasis in the upper lobe. Patchy pneumonia left lower lobe.  Left lower lobe bronchiectasis. Pleural Cytology 5/15 >> CT Head 5/16 >>    CULTURES: BCx2 5/14 >>  RVP 5/14 >> negative R pleural fluid 5/15 >>     ASSESSMENT / PLAN:  Acute Hypoxic Respiratory Failure - multifactorial in the setting of LLL CAP, bronchiectasis, underlying COPD, pulmonary HTN (36 mm Hg 5 years ago) and large right pleural effusion.  S/P thora with 300 ml pleural fluid.  Exudative by LDH (ratio 2).    Plan: Follow plural cultures / cytology  Continue duonebs + PRN albuterol  Vanco / Zosyn per primary SVC  Wean O2 for sats > 90% Follow cultures / PCT and narrow antibiotics as able  Intermittent CXR  Aggressive pulmonary hygiene - IS, early mobilization Follow up ECHO Consider palliative care discussion regarding GOC given multiple medical problems / advanced age   Canary Brim, NP-C [place] [facility] Pgr: [phone] or if no answer [phone] 01/06/2017, 11:07 AM  Attending Note:  83 year old female with ESRD who presents to the hospital with AMS and SOB.  CT of the chest was done that I reviewed myself and pleural effusion was noted.  Patient is confused but cooperative on exam with decreased BS on the right.  Requiring 2L [place].  PCCM consulted for thoracentesis.  Discussed with renal and PCCM-NP.  Effusion exudative with pending cytology.  Pleural effusion: Thora performed, exudative.  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             - F/U on cytology             - Fluid even  Hypoxemia:             - Titrate O2 for sat of 88-92%             - May need home O2, will discuss when closer to discharge.  Pulmonary HTN: 36 mmHg 5 years ago             - Repeat echo pending  SVC syndrome:             - No surgical  intervention given age and frailty.             - Palliative care seeing, recommend palliative approach  PCCM will see again in AM to f/u on cytology.  Patient seen and examined, agree with above note.  I dictated the care and orders written for this patient under my direction.  Alyson Reedy, MD [phone]

## 2017-01-06 NOTE — Progress Notes (Signed)
Family Medicine Teaching Service Daily Progress Note Intern Pager: [phone]  Patient name: Claudia Rich Medical record number: [id number] Date of birth: [birth year] Age: 83 y.o. Gender: female  Primary Care Provider: Araceli Bouche, DO Consultants: Renal Code Status: Full  Pt Overview and Major Events to Date:  5/13 admit to FPTS  Assessment and Plan: Claudia Rich is a 83 y.o. female presenting with SOB, AMS . PMH is significant for ESRD on HD, dementia, CAD, T2DM, asthma.  SOB/Hypoxia, improved:  Thought to be 2/2 HAP, additionally CTA showed RLL and RML collapse with large pulmonary effusion. Patient is day #1 s/p thoracentesis by pulmonology with 300 ml cloudy pleural fluid obtained. Per pulm note, there remains located loculated pocket of fluid unable to be removed. Exudative fluid by light's criteria. Leukocytosis improving at 17.6 from 22 - Continue to monitor  telemetry - VS per floor protocol - currently continuing Vanc (5/13 - ), Zosyn (5/15 - ), s/p cefepime (5/13>5/15)  - O2 prn saturations <92%. - f/u BCx obtained in ED (NGTD) - will place palliative consult - PT >> SNF. [employer] worker) elects home wit home  health  AMS - using repetitive speech such as "uh huh" and "mhm" since yesterday.  - will order CT head now - NPO because altered  HTN currently normotensive. on norvasc 10 mg, metoprolol 25 mg at home.  - patient not receiving PO meds while NPO - monitor BP, add on IV meds as needed  ESRD on HD: Tu,Th, Sat.  Electrolytes as above.  produces small amounts of urine.  DM2:  11/2016 A1c 7.9. On Januvia at home. - sSSI while hospitalized - BG checks qAC   Pressure wound: per POA is sacral wound.  This was not well visualized on exam. - c/s Wound RN  GERD w/ h/o peptic ulcer: on Zantac 150mg  BID - will put on PPI, as this does not require renal dosing.   - If patient prefers to be on H2 blocker going forward, dose should be 1mg /kg/d of Zantac in setting of ESRD  on HD.  FEN/GI: SLIV, HH/Carb mod renal diet, Protonix Prophylaxis: Lovenox subq renal dosed     Disposition: Home pending transition to PO Abx  Subjective:  Patient unable to verbalize anything in response to my questions this AM. Respiratory status is improved.  Objective: Temp:  [97.3 F (36.3 C)-98.3 F (36.8 C)] 97.3 F (36.3 C) (05/16 1015) Pulse Rate:  [97-110] 103 (05/16 1015) Resp:  [18-30] 25 (05/16 1015) BP: (92-156)/(41-105) 119/52 (05/16 1015) SpO2:  [94 %-100 %] 99 % (05/16 1015) FiO2 (%):  [2 %] 2 % (05/15 2008) Weight:  [67.1 kg (147 lb 14.9 oz)-68.7 kg (151 lb 7.3 oz)] 68.7 kg (151 lb 7.3 oz) (05/16 0500) Physical Exam:  General: patient appears uncomfortable, breathing comfortably Cardiovascular: RRR, no m/r/g Respiratory: comfortable work of breathing,  Abdomen: soft and nontender, nondistended Extremities: no LE edema Psych: AAOx0/4  Laboratory:  Recent Labs Lab 01/04/17 0602 01/05/17 0452 01/06/17 0443  WBC 18.6* 22.4* 17.6*  HGB 9.8* 9.1* 8.1*  HCT 30.6* 28.3* 25.1*  PLT 200 247 223    Recent Labs Lab 01/04/17 0602 01/05/17 0452 01/05/17 1653 01/06/17 0443  NA 136 132*  --  135  K 3.2* 3.5  --  3.7  CL 95* 94*  --  96*  CO2 26 23  --  25  BUN 32* 51*  --  21*  CREATININE 6.07* 8.00*  --  4.39*  CALCIUM  9.0 9.3  --  8.8*  PROT  --   --  8.1  --   GLUCOSE 223* 394*  --  216*     Imaging/Diagnostic Tests: Dg Chest 2 View: 01/03/2017 FINDINGS: Moderate right pleural effusion. Associated right lower lobe opacity, likely compressive atelectasis. No frank interstitial edema.  No pneumothorax. The heart is normal in size. Vascular stent overlying the mediastinum. Old left rib fracture deformities.  IMPRESSION: Moderate right pleural effusion. Associated right lower lobe opacity, likely compressive atelectasis.   Ct Angio Chest Pe W Or Wo Contrast  01/04/2017 IMPRESSION: Large right pleural effusion layering dependently with subtotal  collapse of the right lung. Patchy pneumonia left lower lobe.  Left lower lobe bronchiectasis. 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 Aortic and coronary artery calcification. Multiple venous collaterals suggest stenosis of the the SVC at the SVC innominate junction. Innominate stent in place. Uncertain if this is patent.    Howard PouchFeng, Damoney Julia, MD 01/06/2017, 10:58 AM PGY-1, [facility] Family Medicine FPTS Intern pager: [phone], text pages welcome

## 2017-01-06 NOTE — Progress Notes (Signed)
S:  Called by RN regarding shortness of breath.  Was seen by FTPS during event (primary SVC).  ABG and CXR ordered. Per report, they were unable to pull much volume off during HD 5/16 due to low pressures.  ? If patient had O2 on face during episode of desaturation > she kept pulling mask off during CT.  Did not tolerate CT > became very agitated and pulling mask off face.   O:  Vitals:   01/06/17 1015 01/06/17 1112  BP: (!) 119/52 (!) 126/54  Pulse: (!) 103 (!) 102  Resp: (!) 25 20  Temp: 97.3 F (36.3 C) 98 F (36.7 C)    Recent Labs Lab 01/05/17 1230 01/06/17 1250  PHART 7.462* 7.436  PCO2ART 38.8 38.5  PO2ART PENDING 97.0  HCO3 27.3 25.6  O2SAT 90.4 95.3    Recent Labs Lab 01/04/17 0602 01/05/17 0452 01/06/17 0443  HGB 9.8* 9.1* 8.1*  HCT 30.6* 28.3* 25.1*  WBC 18.6* 22.4* 17.6*  PLT 200 247 223    Recent Labs Lab 01/03/17 1719 01/03/17 2108 01/04/17 0602 01/05/17 0452 01/06/17 0443  NA 133*  --  136 132* 135  K 2.7*  --  3.2* 3.5 3.7  CL 93*  --  95* 94* 96*  CO2 28  --  26 23 25   GLUCOSE 232*  --  223* 394* 216*  BUN 25*  --  32* 51* 21*  CREATININE 6.01*  --  6.07* 8.00* 4.39*  CALCIUM 9.1  --  9.0 9.3 8.8*  MG  --  1.7  --   --   --   PHOS  --   --  4.0 4.2 2.9   General: frail elderly female, sitting up in bed   HEENT: MM pink/dry Neuro: awake, alert, no verbal communication but MAE, pulls O2 off face, agitated during CT scan  CV: s1s2 rrr, no m/r/g PULM: even/non-labored, diminished on R with crackles, L posterior basilar crackles  ZO:XWRUGI:soft, non-tender, bsx4 active  Extremities: warm/dry, no edema  Skin: no rashes or lesions    CXR > images personally reviewed, increase in right pleural effusion size.   A:  Right Pleural Effusion - re-acummulation but doubt hemothorax from prior thora as no associated change in vital signs, modest change in Hgb but could be dilutional as only partial HD completed yesterday Shortness of Breath    Plan: Monitor respiratory status closely  O2 as needed to support sats > 90% Prior fluid studies are consistent with an exudative process  No role for repeat thoracentesis at this time She is not a candidate for surgical intervention given advanced dementia  Agree with DNR status  Could consider asking if renal would do additional mini session of HD?  Not sure she could tolerate  Canary BrimBrandi Ollis, NP-C  Pulmonary & Critical Care Pgr: [phone] or if no answer [phone] 01/06/2017, 1:57 PM  Alyson ReedyWesam G. Raybon Conard, M.D. [facility] Pulmonary/Critical Care Medicine. Pager: [phone]. After hours pager: [phone].

## 2017-01-06 NOTE — Progress Notes (Signed)
Patient off unit for CT Scan. Left unit via bed, accompanied by transportation techinician. No signs of distress. 100% non rebreather intact, 02 saturation 99-100%.

## 2017-01-06 NOTE — Progress Notes (Signed)
Pt's mental status has stayed the same throughout the night. Pt is awake and sitting upright in the bed. Pt is using repetitive speech such as "uh huh" and "mhm" when being asked questions. Pt is not oriented to person, place, time, or situation. Pt last BP 136/78. O2 sats 99% on 3L [place]. Pt's O2 sats dropped to low 80s when patient was off the nasal cannula. Will continue to monitor.

## 2017-01-06 NOTE — Progress Notes (Signed)
Results of Stat PCXR called to Canary BrimBrandi Ollis NP with Pulmonary.  Brandi NP notified of patient's respiratory distress and need for 100% nonrebreather to maintain sats >100%.  Dr. Chanetta Marshallimberlake paged and notified of CXR results and that it was called to Pulmonary.  Colman Caterarpley, Damariz Paganelli Danielle

## 2017-01-06 NOTE — Discharge Summary (Signed)
[facility]  Patient name: Claudia Rich Medical record number: [id number] Date of birth: 15-May-1927 Age: 83 y.o. Gender: female Date of Admission: 01/03/2017  Date of Discharge: 01/16/2017 Admitting Physician: Nestor RampSara L Neal, MD  Primary Care Provider: Araceli Boucheumley, Collinston N, DO Consultants: Nephrology, CCM, Palliative care, Interventional Radiology  Indication for Hospitalization: Shortness of breath, Altered mental status  Discharge Diagnoses/Problem List:  ESRD Pneumonia with loculation Dementia  T2DM GERD  Disposition: SNF/Hospice/Comfort care  Discharge Condition: Stable  Discharge Exam:  General: NAD, pleasant, able to participate in exam Cardiac: RRR, normal heart sounds, no murmurs. 2+ radial and PT pulses bilaterally Respiratory: CTAB, normal effort, No wheezes, rales or rhonchi Abdomen: soft, nontender, nondistended, no hepatic or splenomegaly, +BS Extremities: no edema or cyanosis. WWP. Skin: warm and dry, no rashes noted Neuro: alert and oriented x4, no focal deficits Psych: Normal affect and mood  Brief Hospital Course:  Claudia Rich a 83 y.o.femalepresenting with SOB, AMS. PMH is significant for ESRD on HD, dementia, CAD, T2DM, asthma.  Patient was noted to have HAP initially on CXR and was started on vancomycin and cefepime in the ED. Subsequently, patient was found to have worsening respiratory status with desaturations and labored breathing as well as worsening mentation. CTA was completed and was negative for PE, but notable for RLL and RML collapse with large pulmonary effusion. Pulmonology/Critical Care Medicine was consulted for thoracentesis (completed 5/15) with 300 ml of pleural fluid removed. There were loculated areas noted that were unreachable by thoracentesis.  Fluid was exudative by light's criteria.   After thoracentesis, cefepime was discontinued and zosyn was added to vancomycin with noted improvement in  leukocytosis and respiratory status. However, mentation was worsened. She was noted to have repetitive speech, and was alert and oriented x0.  CT head was performed and showed Evidence of old infarct but otherwise unremarkable. Over the next few days, mentation gradually improve however patient respiratory deteriorated with increase oxygen requirement. Patient was on 5L [place] and intermittently required Non Rebreather mask to maintain oxygenation in the low to mid 90s. Patient lung exam was concerning for reaccumulation of pleural effusion. Repeat CXR showed near complete opacity of her right lung explaining increase oxygen need and desaturation. Patient became increasingly anxious with desaturation which exacerbated air hunger. PCCM was reconsulted for possible thoracentesis but patient was not a candidate.  Given patient's age, multiple comorbidities, worsening clinical picture and poor prognosis, palliative care was consulted to discuss goals of care with Family. Patient was placed on DNR after talking conversation POA. Patient was transitioned from vancomycin/zosyn to Levaquin. Leukocytosis was stable but predictable slightly above normal given loculated areas. Interventional Radiology was also consulted to evaluate patient for possible thoracentesis and pleurX placement for palliative measures. However after further discussion with Family they opted against further treatment and placed patient under hospice care.  Throughout her hospitalization, patient was closely followed by Nephrology and received HD on T, TH, Sat with good tolerance and no complications. Mental status has waxed and waned with some delirium contributing to worsening mental status at times. Patient was regularly see by PT and speech therapy to assess gait and risk for aspiration respectively. Sacrum pressure wound was well care for by nursing staff during inpatient stay.  Issues for Follow Up:   Significant Procedures: Thoracentesis  5/15  Significant Labs and Imaging:   Recent Labs Lab 01/13/17 0825 01/14/17 0645 01/15/17 0346  WBC 11.8* 12.6* 13.6*  HGB 8.2* 7.6* 9.4*  HCT 26.9* 25.1* 29.9*  PLT 342 324 338    Recent Labs Lab 01/11/17 0420 01/12/17 0432 01/13/17 0825 01/14/17 0645 01/15/17 0346  NA 132* 131* 133* 131* 133*  K 3.7 3.9 4.7 4.9 4.2  CL 93* 91* 95* 92* 93*  CO2 27 26 25 26 28   GLUCOSE 245* 219* 256* 291* 269*  BUN 31* 45* 21* 33* 14  CREATININE 5.07* 6.73* 4.52* 6.21* 3.88*  CALCIUM 9.2 9.3 9.2 9.3 9.2  PHOS  --   --   --  4.3  --   ALBUMIN  --   --   --  2.0*  --      Results/Tests Pending at Time of Discharge: None  Discharge Medications:  Allergies as of 01/16/2017      Reactions   Codeine Sulfate Other (See Comments)   "It made me sick"   Meperidine Hcl Nausea And Vomiting   REACTION: vomiting      Medication List    STOP taking these medications   amLODipine 10 MG tablet Commonly known as:  NORVASC   aspirin 81 MG chewable tablet   atorvastatin 80 MG tablet Commonly known as:  LIPITOR   JANUVIA 25 MG tablet Generic drug:  sitaGLIPtin     TAKE these medications   acetaminophen 500 MG tablet Commonly known as:  TYLENOL Take 500 mg by mouth every 6 (six) hours as needed for mild pain.   alum & mag hydroxide-simeth 200-200-20 MG/5ML suspension Commonly known as:  MAALOX/MYLANTA Take 30 mLs by mouth every 6 (six) hours as needed for indigestion or heartburn.   AURYXIA 1 GM 210 MG(Fe) tablet Generic drug:  ferric citrate Take 1 tablet by mouth 3 (three) times daily.   docusate sodium 100 MG capsule Commonly known as:  COLACE Take 100 mg by mouth daily as needed for mild constipation.   feeding supplement (PRO-STAT SUGAR FREE 64) Liqd Take 30 mLs by mouth 2 (two) times daily.   fentaNYL Soln Commonly known as:  SUBLIMAZE Inject 20 mcg into the vein every 15 (fifteen) minutes as needed (uncontrolled pain, distress or if respiratory rate is greater than  25).   glycopyrrolate 1 MG tablet Commonly known as:  ROBINUL Take 1 tablet (1 mg total) by mouth every 4 (four) hours as needed (excessive secretions).   glycopyrrolate 0.2 MG/ML injection Commonly known as:  ROBINUL Inject 1 mL (0.2 mg total) into the skin every 4 (four) hours as needed (excessive secretions).   glycopyrrolate 0.2 MG/ML injection Commonly known as:  ROBINUL Inject 1 mL (0.2 mg total) into the vein every 4 (four) hours as needed (excessive secretions).   haloperidol 0.5 MG tablet Commonly known as:  HALDOL Take 1 tablet (0.5 mg total) by mouth every 4 (four) hours as needed for agitation (or delirium).   haloperidol 2 MG/ML solution Commonly known as:  HALDOL Place 0.3 mLs (0.6 mg total) under the tongue every 4 (four) hours as needed for agitation (or delirium).   haloperidol lactate 5 MG/ML injection Commonly known as:  HALDOL Inject 0.1 mLs (0.5 mg total) into the vein every 4 (four) hours as needed (or delirium).   hydrocortisone cream 1 % Apply topically 4 (four) times daily as needed for itching.   LORazepam 1 MG tablet Commonly known as:  ATIVAN Take 1 tablet (1 mg total) by mouth every 4 (four) hours as needed for anxiety.   LORazepam 2 MG/ML concentrated solution Commonly known as:  ATIVAN Place 0.5 mLs (1  mg total) under the tongue every 4 (four) hours as needed for anxiety.   LORazepam 2 MG/ML injection Commonly known as:  ATIVAN Inject 0.5 mLs (1 mg total) into the vein every 4 (four) hours as needed for anxiety.   metoprolol tartrate 25 MG tablet Commonly known as:  LOPRESSOR TAKE 1 TABLET TWICE DAILY ON NON-DIALYSIS DAYS AND TAKE 1 TABLET DAILY ON DIALYSIS DAYS.   mometasone-formoterol 200-5 MCG/ACT Aero Commonly known as:  DULERA Inhale 2 puffs into the lungs 2 (two) times daily.   nystatin 100000 UNIT/ML suspension Commonly known as:  MYCOSTATIN Take 5 mLs (500,000 Units total) by mouth 4 (four) times daily.   pantoprazole 40 MG  tablet Commonly known as:  PROTONIX Take 1 tablet (40 mg total) by mouth daily. Start taking on:  01/17/2017   polyethylene glycol packet Commonly known as:  MIRALAX / GLYCOLAX Take 17 g by mouth daily as needed for mild constipation.   QC LORATADINE ALLERGY RELIEF 10 MG tablet Generic drug:  loratadine TAKE 1 TABLET ONCE DAILY.   ranitidine 150 MG capsule Commonly known as:  ZANTAC Take 1 capsule (150 mg total) by mouth 2 (two) times daily. For stomach ulcer   RENAL MULTIVITAMIN FORMULA PO Take 1 tablet by mouth every evening.   tiotropium 18 MCG inhalation capsule Commonly known as:  SPIRIVA Place 1 capsule (18 mcg total) into inhaler and inhale daily. Start taking on:  01/17/2017   VENTOLIN HFA 108 (90 Base) MCG/ACT inhaler Generic drug:  albuterol USE 2 PUFFS ONCE A DAY AS NEEDED       Discharge Instructions: Please refer to Patient Instructions section of EMR for full details.  Patient was counseled important signs and symptoms that should prompt return to medical care, changes in medications, dietary instructions, activity restrictions, and follow up appointments.   Follow-Up Appointments: Follow-up Information    Araceli Bouche, DO. Go on 01/12/2017.   Specialty:  Family Medicine Why:  9:45 am (hospital follow up) Contact information: [address] [place] [address] [phone]           Lovena Neighbours, MD 01/16/2017, 11:34 AM PGY-1, [facility] Family Medicine

## 2017-01-06 NOTE — Progress Notes (Addendum)
[facility] Progress Note   Subjective: confused, weak  Vitals:   01/06/17 0500 01/06/17 0800 01/06/17 1015 01/06/17 1112  BP: 136/78 113/75 (!) 119/52 (!) 126/54  Pulse: 97  (!) 103 (!) 102  Resp: 20 20 (!) 25 20  Temp: 98.3 F (36.8 C)  97.3 F (36.3 C) 98 F (36.7 C)  TempSrc: Oral  Oral Axillary  SpO2: 100% 100% 99% 97%  Weight: 68.7 kg (151 lb 7.3 oz)     Height:        Inpatient medications: . amLODipine  10 mg Oral QPM  . aspirin  81 mg Oral q morning - 10a  . atorvastatin  80 mg Oral QHS  . Chlorhexidine Gluconate Cloth  6 each Topical Q0600  . darbepoetin (ARANESP) injection - DIALYSIS  100 mcg Intravenous Q Tue-HD  . doxercalciferol  2 mcg Intravenous Q T,Th,Sa-HD  . enoxaparin (LOVENOX) injection  30 mg Subcutaneous Q24H  . feeding supplement (PRO-STAT SUGAR FREE 64)  30 mL Oral BID  . ferric citrate  420 mg Oral TID WC  . insulin aspart  0-9 Units Subcutaneous TID WC  . ipratropium-albuterol  3 mL Nebulization BID  . loratadine  10 mg Oral Daily  . mouth rinse  15 mL Mouth Rinse BID  . metoprolol tartrate  25 mg Oral BID  . multivitamin  1 tablet Oral QHS  . mupirocin ointment  1 application Nasal BID  . pantoprazole  40 mg Oral Daily  . sodium chloride flush  3 mL Intravenous Q12H   . piperacillin-tazobactam (ZOSYN)  IV Stopped (01/06/17 0730)  . vancomycin 750 mg (01/05/17 1113)   acetaminophen **OR** acetaminophen, albuterol, docusate sodium, polyethylene glycol  Exam: Awake, confused, perseverates w/ asked questions +JVD Chest bronchial BS 2/3 up on R, L side clear RRR no mrg Abd soft no ascites Ext no edema NF, confused and not following commands well  [facility], BFR 400, DFR A1.5, 2K/2.25Ca, Profile 4, EDW 67.5kg, AVG - Heparin 2000 unit bolus q HD - Hectoral 2mcg IV q HD - Mircera 150mcg q 2 weeks (just reordered, last given 11/24/16)      Assessment: 1. Hypoxemia/ RLL PNA/ exudative R pleural effusion  - on IV abx, not candidate for VATS/ drainage 2. Volume - no vol excess 3. ESRD HD TTS 4. AMS - prob metabolic on background of dementia 5. DM on po meds at home, SSI here 6. Anemia of CKD - starting ESA here 7. DNR  Plan - patient is DNR now, very frail on exam, altered MS, not sure she will be able to tolerate dialysis in this condition. My recommendation would be to move towards comfort care if possible.  Will hold on HD for now pending results of pall care meetings.    Vinson Moselleob Krishna Heuer MD [facility] pager [phone]   01/06/2017, 1:17 PM    Recent Labs Lab 01/04/17 0602 01/05/17 0452 01/06/17 0443  NA 136 132* 135  K 3.2* 3.5 3.7  CL 95* 94* 96*  CO2 26 23 25   GLUCOSE 223* 394* 216*  BUN 32* 51* 21*  CREATININE 6.07* 8.00* 4.39*  CALCIUM 9.0 9.3 8.8*  PHOS 4.0 4.2 2.9    Recent Labs Lab 01/04/17 0602 01/05/17 0452 01/05/17 1653 01/06/17 0443  PROT  --   --  8.1  --   ALBUMIN 2.4* 2.3*  --  2.1*    Recent Labs Lab 01/04/17 0602 01/05/17 0452 01/06/17 0443  WBC  18.6* 22.4* 17.6*  HGB 9.8* 9.1* 8.1*  HCT 30.6* 28.3* 25.1*  MCV 99.4 97.9 97.7  PLT 200 247 223   Iron/TIBC/Ferritin/ %Sat    Component Value Date/Time   IRON 84 12/14/2014 1443   TIBC 216 (L) 12/14/2014 1443   FERRITIN 1,385 (H) 12/14/2014 1443   IRONPCTSAT 39 12/14/2014 1443

## 2017-01-06 NOTE — Progress Notes (Signed)
PT Cancellation Note  Patient Details Name: Claudia Rich MRN: [id number] DOB: 04/19/1927   Cancelled Treatment:    Reason Eval/Treat Not Completed: Medical issues which prohibited therapy, spoke with OT, not appropriate for therapy at this time, await stat head CT    [name] 01/06/2017, 12:25 PM

## 2017-01-07 ENCOUNTER — Telehealth: Payer: Self-pay | Admitting: Family Medicine

## 2017-01-07 DIAGNOSIS — R531 Weakness: Secondary | ICD-10-CM

## 2017-01-07 LAB — RENAL FUNCTION PANEL
ANION GAP: 20 — AB (ref 5–15)
Albumin: 2.1 g/dL — ABNORMAL LOW (ref 3.5–5.0)
BUN: 33 mg/dL — ABNORMAL HIGH (ref 6–20)
CALCIUM: 9.2 mg/dL (ref 8.9–10.3)
CO2: 23 mmol/L (ref 22–32)
CREATININE: 6.6 mg/dL — AB (ref 0.44–1.00)
Chloride: 95 mmol/L — ABNORMAL LOW (ref 101–111)
GFR, EST AFRICAN AMERICAN: 6 mL/min — AB (ref >60)
GFR, EST NON AFRICAN AMERICAN: 5 mL/min — AB (ref >60)
Glucose, Bld: 185 mg/dL — ABNORMAL HIGH (ref 65–99)
Phosphorus: 3.7 mg/dL (ref 2.5–4.6)
Potassium: 3.7 mmol/L (ref 3.5–5.1)
SODIUM: 138 mmol/L (ref 135–145)

## 2017-01-07 LAB — CBC
HCT: 28.2 % — ABNORMAL LOW (ref 36.0–46.0)
HEMOGLOBIN: 8.7 g/dL — AB (ref 12.0–15.0)
MCH: 30.6 pg (ref 26.0–34.0)
MCHC: 30.9 g/dL (ref 30.0–36.0)
MCV: 99.3 fL (ref 78.0–100.0)
PLATELETS: 250 10*3/uL (ref 150–400)
RBC: 2.84 MIL/uL — AB (ref 3.87–5.11)
RDW: 17.3 % — ABNORMAL HIGH (ref 11.5–15.5)
WBC: 16.3 10*3/uL — ABNORMAL HIGH (ref 4.0–10.5)

## 2017-01-07 LAB — VANCOMYCIN, RANDOM: Vancomycin Rm: 23

## 2017-01-07 LAB — GLUCOSE, CAPILLARY
GLUCOSE-CAPILLARY: 139 mg/dL — AB (ref 65–99)
Glucose-Capillary: 221 mg/dL — ABNORMAL HIGH (ref 65–99)

## 2017-01-07 LAB — PROCALCITONIN: Procalcitonin: 18.56 ng/mL

## 2017-01-07 MED ORDER — LIDOCAINE-PRILOCAINE 2.5-2.5 % EX CREA
1.0000 | TOPICAL_CREAM | CUTANEOUS | Status: DC | PRN
Start: 2017-01-07 — End: 2017-01-08
  Filled 2017-01-07: qty 5

## 2017-01-07 MED ORDER — HEPARIN SODIUM (PORCINE) 1000 UNIT/ML DIALYSIS
1000.0000 [IU] | INTRAMUSCULAR | Status: DC | PRN
  Filled 2017-01-07: qty 1

## 2017-01-07 MED ORDER — PENTAFLUOROPROP-TETRAFLUOROETH EX AERO: 1.0000 "application " | INHALATION_SPRAY | CUTANEOUS | Status: DC | PRN

## 2017-01-07 MED ORDER — LIDOCAINE HCL (PF) 1 % IJ SOLN
5.0000 mL | INTRAMUSCULAR | Status: DC | PRN
  Filled 2017-01-07: qty 5

## 2017-01-07 MED ORDER — SODIUM CHLORIDE 0.9 % IV SOLN: 100.0000 mL | INTRAVENOUS | Status: DC | PRN

## 2017-01-07 MED ORDER — METOPROLOL TARTRATE 5 MG/5ML IV SOLN
2.5000 mg | Freq: Four times a day (QID) | INTRAVENOUS | Status: DC
  Administered 2017-01-07 – 2017-01-08 (×7): 2.5 mg via INTRAVENOUS
  Filled 2017-01-07 (×6): qty 5

## 2017-01-07 MED ORDER — ALTEPLASE 2 MG IJ SOLR: 2.0000 mg | Freq: Once | INTRAMUSCULAR | Status: DC | PRN

## 2017-01-07 MED ORDER — HEPARIN SODIUM (PORCINE) 1000 UNIT/ML DIALYSIS
2000.0000 [IU] | Freq: Once | INTRAMUSCULAR | Status: AC
  Administered 2017-01-07: 2000 [IU] via INTRAVENOUS_CENTRAL
  Filled 2017-01-07: qty 2

## 2017-01-07 MED ORDER — NYSTATIN 100000 UNIT/ML MT SUSP
5.0000 mL | Freq: Four times a day (QID) | OROMUCOSAL | Status: DC
  Administered 2017-01-08 – 2017-01-16 (×25): 500000 [IU] via ORAL
  Filled 2017-01-07 (×24): qty 5

## 2017-01-07 MED ORDER — DOXERCALCIFEROL 4 MCG/2ML IV SOLN
INTRAVENOUS | Status: AC
  Administered 2017-01-07: 2 ug via INTRAVENOUS
  Filled 2017-01-07: qty 2

## 2017-01-07 NOTE — Progress Notes (Signed)
  Speech Language Pathology Treatment:    Patient Details Name: Claudia Rich MRN: [id number] DOB: [birth year] Today's Date: 01/07/2017 Time:  -     Assessment / Plan / Recommendation Clinical Impression:  Pt was sitting on the edge of the bed and was able to follow simple commands and answered questions with a single word delayed response appropriately. Nurse reported patient held pills in applesauce in mouth and then let them spill out anteriorly. Pt does appear to have oral thrush and nurse was notified. Pt tolerated chocolate pudding X3 by spoon with no s/s of aspiration. However she chewed fruit cocktail and never swallowed (spit out into napkin). Pt tolerated ice ships and small cup sips of water with no s/s of aspiration. A diet of Dysphagia 1, thin liquids is recommended with full assistance to ensure safelty. Please monitor for small bites, sips and changes in cognition. Pills to be given in pudding crushed.     HPI  83 yo female admitted with AMS LLL infiltrate and L R pleural effusion with RML/ RLL collapse. PMH: ESRD TTHSAT COPD, PVD, dementia, CAD. DM. asthma. Thoracentesis 5/15. Significant deterioration from baseline function.          SLP Plan: Follow for diet tolerations          Recommendations: Dys 1, thin liquid diet, meds crushed in pudding (pt preference),full assistance.                           GO               Lindalou HoseSarah J. Wilmer Berryhill, MA, CCC-SLP 01/07/2017 3:05 PM

## 2017-01-07 NOTE — Telephone Encounter (Signed)
Called Valentina GuLucy and gave her the number to the inpatient pager. Sunday SpillersSharon T Mazella Deen, CMA

## 2017-01-07 NOTE — Progress Notes (Signed)
[facility] Progress Note   Subjective: feeling better, conversant today  Vitals:   01/07/17 0223 01/07/17 0400 01/07/17 0743 01/07/17 1056  BP: (!) 135/56 126/74 99/77   Pulse: (!) 108 (!) 105 (!) 103 (!) 102  Resp: 12 (!) 27 (!) 22   Temp:   97.9 F (36.6 C)   TempSrc:  Axillary Oral Oral  SpO2: 100% 97% 91%   Weight:  66.3 kg (146 lb 1.6 oz)    Height:        Inpatient medications: . amLODipine  10 mg Oral QPM  . aspirin  81 mg Oral q morning - 10a  . atorvastatin  80 mg Oral QHS  . Chlorhexidine Gluconate Cloth  6 each Topical Q0600  . darbepoetin (ARANESP) injection - DIALYSIS  100 mcg Intravenous Q Tue-HD  . doxercalciferol  2 mcg Intravenous Q T,Th,Sa-HD  . feeding supplement (PRO-STAT SUGAR FREE 64)  30 mL Oral BID  . ferric citrate  420 mg Oral TID WC  . insulin aspart  0-9 Units Subcutaneous TID WC  . loratadine  10 mg Oral Daily  . mouth rinse  15 mL Mouth Rinse BID  . metoprolol tartrate  2.5 mg Intravenous Q6H  . multivitamin  1 tablet Oral QHS  . mupirocin ointment  1 application Nasal BID  . pantoprazole  40 mg Oral Daily  . sodium chloride flush  3 mL Intravenous Q12H   . piperacillin-tazobactam (ZOSYN)  IV 3.375 g (01/07/17 0228)  . vancomycin 750 mg (01/05/17 1113)   acetaminophen **OR** acetaminophen, albuterol, docusate sodium, polyethylene glycol  Exam: Awake, alert, responding to questions, oreinted to "hospital", not year, sig better today Chest bronchial BS 2/3 up on R, L side slight basilar rales RRR no mrg Abd soft no ascites Ext no edema Nonfocal, Ox 2  D[facility], BFR 400, DFR A1.5, 2K/2.25Ca, Profile 4, EDW 67.5kg, AVG - Heparin 2000 unit bolus q HD - Hectoral 2mcg IV q HD - Mircera 150mcg q 2 weeks (just reordered, last given 11/24/16)      Assessment: 1. Hypoxemia/ RLL PNA/ exudative R pleural effusion - on IV abx, not candidate for VATS/ drainage. Looks better today 1st time, MS changes  resolving, responding and looking stronger.  2. Volume - no vol excess 3. ESRD HD TTS 4. AMS - prob metabolic on background of dementia 5. DM on po meds at home, SSI here 6. Anemia of CKD - starting ESA here 7. DNR  Plan - looks better, may be turning the corner.  Plan HD today upstairs on schedule.     Vinson Moselleob Kyro Joswick MD [facility] pager [phone]   01/07/2017, 10:57 AM    Recent Labs Lab 01/05/17 0452 01/06/17 0443 01/07/17 0515  NA 132* 135 138  K 3.5 3.7 3.7  CL 94* 96* 95*  CO2 23 25 23   GLUCOSE 394* 216* 185*  BUN 51* 21* 33*  CREATININE 8.00* 4.39* 6.60*  CALCIUM 9.3 8.8* 9.2  PHOS 4.2 2.9 3.7    Recent Labs Lab 01/05/17 0452 01/05/17 1653 01/06/17 0443 01/07/17 0515  PROT  --  8.1  --   --   ALBUMIN 2.3*  --  2.1* 2.1*    Recent Labs Lab 01/05/17 0452 01/06/17 0443 01/07/17 0515  WBC 22.4* 17.6* 16.3*  HGB 9.1* 8.1* 8.7*  HCT 28.3* 25.1* 28.2*  MCV 97.9 97.7 99.3  PLT 247 223 250   Iron/TIBC/Ferritin/ %Sat    Component Value Date/Time  IRON 84 12/14/2014 1443   TIBC 216 (L) 12/14/2014 1443   FERRITIN 1,385 (H) 12/14/2014 1443   IRONPCTSAT 39 12/14/2014 1443

## 2017-01-07 NOTE — Telephone Encounter (Signed)
Claudia Rich, nurse at [employer], is requesting another type of pain medication.  Pt and family have complained about the rectal pain meds.  She is not reliablely  taking PO meds. Please advise

## 2017-01-07 NOTE — Progress Notes (Signed)
HD tx completed @ 2030 w/o problem, uf goal met (kept even per MD order), blood rinsed back, pt has increased congestion and rattling w/ non productive cough, primary RN made aware that the pt may benefit from suctioning if appropriate, report given to primary RN Delma Officer, RN

## 2017-01-07 NOTE — Progress Notes (Signed)
HD tx initiated via 15G x2 w/o problem,, VSS, RR and O2sat responded to O2 being started back after not transported on O2 to unit, pt confused, will cont to monitor while on HD

## 2017-01-07 NOTE — Progress Notes (Signed)
Family Medicine Teaching Service Daily Progress Note Intern Pager: [phone]  Patient name: Claudia Rich Medical record number: [id number] Date of birth: Jan 19, 1927 Age: 83 y.o. Gender: female  Primary Care Provider: Araceli Bouche, DO Consultants: Renal Code Status: Full  Pt Overview and Major Events to Date:  5/13 admit to FPTS  Assessment and Plan: Claudia Rich is a 83 y.o. female presenting with SOB, AMS . PMH is significant for ESRD on HD, dementia, CAD, T2DM, asthma.  #SOB with Hypoxia Patient is day #2 s/p thoracentesis by pulmonology with 300 ml cloudy pleural fluid obtained. Loculated pocket of fluid unable to be removed. Currently on Vancomycin and Zosyn. WBCs this am is 16.3 from 17.7. Patient is currently on 5L [place] with O2 sat in the low to mid 90's. Blood cultures have shown no growth to date. Patient is now DNR after discussion between Dr. Pollie Meyer and POA.  --Continue telemetry --Continue Vancomycin DAY 5 ( start 5/13 - ) --Continue Zosyn DAY 3 (start 5/15 ) --O2 prn saturations <92% --Follow up on pleural fluid culture and cytology. --VS per floor protocol --Follow up on palliative care consult   #AMS  Patient has improved mental status compared to yesterday, still confused but more interactive. CT scan was unremarkable and showed evidence of old infarct.  Speech therapy recommend sips of water and meds with puree.   #HTN  Currently normotensive. on norvasc 10 mg, metoprolol 25 mg at home.  --Could resume PO meds as long as mixed in with puree --Continue to monitor   #ESRD on HD  Tu,Th, Sat. Produces small amounts of urine. --Follow up on nephrology recs  DM2 11/2016 A1c 7.9. On Januvia at home. - sSSI while hospitalized - BG checks qAC   #Pressure wound Patient has stage II pressure ulcer wound on the right buttock. Appears dry and is being assess regularly by nursing --Would consult wound care as needed  #GERD w/ h/o peptic ulcer --Continue  pantoprazole 40 mg daily     FEN/GI: SLIV, HH/Carb mod renal diet, Protonix Prophylaxis: Lovenox subq renal dosed    Disposition: Home pending transition to PO Abx  Subjective:  Patient is still confused but is a little more interactive and coherent this morning.  Objective: Temp:  [97.3 F (36.3 C)-98.4 F (36.9 C)] 98.1 F (36.7 C) (05/17 0000) Pulse Rate:  [97-110] 105 (05/17 0400) Resp:  [12-28] 27 (05/17 0400) BP: (113-143)/(45-75) 126/74 (05/17 0400) SpO2:  [93 %-100 %] 97 % (05/17 0400) Weight:  [146 lb 1.6 oz (66.3 kg)] 146 lb 1.6 oz (66.3 kg) (05/17 0400) Physical Exam:  General: patient appears uncomfortable, breathing comfortably Cardiovascular: RRR, no m/r/g Respiratory: comfortable work of breathing,  Abdomen: soft and nontender, nondistended Extremities: no LE edema Psych: AAOx0/4  Laboratory:  Recent Labs Lab 01/05/17 0452 01/06/17 0443 01/07/17 0515  WBC 22.4* 17.6* 16.3*  HGB 9.1* 8.1* 8.7*  HCT 28.3* 25.1* 28.2*  PLT 247 223 250    Recent Labs Lab 01/05/17 0452 01/05/17 1653 01/06/17 0443 01/07/17 0515  NA 132*  --  135 138  K 3.5  --  3.7 3.7  CL 94*  --  96* 95*  CO2 23  --  25 23  BUN 51*  --  21* 33*  CREATININE 8.00*  --  4.39* 6.60*  CALCIUM 9.3  --  8.8* 9.2  PROT  --  8.1  --   --   GLUCOSE 394*  --  216* 185*  Imaging/Diagnostic Tests: Dg Chest 2 View: 01/03/2017 FINDINGS: Moderate right pleural effusion. Associated right lower lobe opacity, likely compressive atelectasis. No frank interstitial edema.  No pneumothorax. The heart is normal in size. Vascular stent overlying the mediastinum. Old left rib fracture deformities.  IMPRESSION: Moderate right pleural effusion. Associated right lower lobe opacity, likely compressive atelectasis.   Ct Angio Chest Pe W Or Wo Contrast  01/04/2017 IMPRESSION: Large right pleural effusion layering dependently with subtotal collapse of the right lung. Patchy pneumonia left lower lobe.   Left lower lobe bronchiectasis. 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 Aortic and coronary artery calcification. Multiple venous collaterals suggest stenosis of the the SVC at the SVC innominate junction. Innominate stent in place. Uncertain if this is patent.    Claudia Neighboursiallo, Zuha Dejonge, MD 01/07/2017, 7:20 AM PGY-1, [facility] Family Medicine FPTS Intern pager: [phone], text pages welcome

## 2017-01-07 NOTE — Progress Notes (Signed)
Patient ID: [name], female   DOB: Oct 03, 1926, 83 y.o.   MRN: [id number]  This NP visited patient at the bedside as a follow up to  yesterday's GOCs meeting.  Spoke with Claudia Rich/neice at bedside and then [name] by telephone to continued discussion regarding diagnosis, prognosis, GOCs, disposition and options.  Patient is improving however she is high risk for decompensation.  Family understand this, they remain hopeful for improvement.    They are open to SNF for rehabilitation when stable and if eligible.  I notified SW of this.  Discussed with family the importance of continued conversation with family and their  medical providers regarding overall plan of care and treatment options,  ensuring decisions are within the context of the patients values and GOCs.   MOST form introduced  Questions and concerns addressed  Time in   1300        Time out  1335 Total time spent on the unit was 35 minutes  Greater than 50% of the time was spent in counseling and coordination of care  Claudia CreedMary Rual Vermeer NP  Palliative Medicine Team Team Phone # [phone] Pager [phone]

## 2017-01-07 NOTE — Progress Notes (Signed)
Pharmacy Antibiotic Note  Claudia Rich is a 83 y.o. female admitted on 01/03/2017 with PNA with lung collapse and pleural effusion. Patient is afebrile and wbc trending down (16.3). Procalcitonin remains elevated at 18.56.   Pre-HD vancomycin level is 23 (therapeutic) on 750 mg IV qHD-TTS.   Plan: 1. Continue Vancomycin 750 mg IV post HD-TTS 2. Continue Zosyn 3.375g IV every 12 hours 3. Will continue to follow HD schedule/duration, culture results, LOT, and antibiotic de-escalation plans   Height: 5\' 4"  (162.6 cm) Weight: 146 lb 1.6 oz (66.3 kg) IBW/kg (Calculated) : 54.7  Temp (24hrs), Avg:98 F (36.7 C), Min:97.6 F (36.4 C), Max:98.4 F (36.9 C)   Recent Labs Lab 01/03/17 1719 01/04/17 0602 01/05/17 0452 01/06/17 0443 01/07/17 0515 01/07/17 1307  WBC 14.1* 18.6* 22.4* 17.6* 16.3*  --   CREATININE 6.01* 6.07* 8.00* 4.39* 6.60*  --   VANCORANDOM  --   --   --   --   --  23    Estimated Creatinine Clearance: 5.6 mL/min (A) (by C-G formula based on SCr of 6.6 mg/dL (H)).    Allergies  Allergen Reactions  . Codeine Sulfate Other (See Comments)    "It made me sick"  . Meperidine Hcl Nausea And Vomiting    REACTION: vomiting    Antimicrobials this admission: 5/13 Cefepime >> 5/15 5/13 Vancomycin >> 5/15 Zosyn >>  Dose adjustments this admission: 5/17 PreHD vancomycin random: 23  Microbiology results: 5/13 BCx >> ngtd 5/13 MRSA PCR >> positive 5/15 Resp panel >> negative 5/15 Pleural fluid >> ngtd  Thank you for allowing pharmacy to be a part of this patient's care.  Allie BossierApryl Anderson, PharmD PGY1 Pharmacy Resident [phone] (Pager) 01/07/2017 3:53 PM

## 2017-01-07 NOTE — Progress Notes (Signed)
Name: Claudia Rich MRN: [id number] DOB: 01/05/1927    ADMISSION DATE:  01/03/2017 CONSULTATION DATE: 01/05/2017  REFERRING MD : Dr. Mosetta Putt  CHIEF COMPLAINT:  Dyspnea with increased WOB, Large R Pleural Effusion per CTA 5/14  BRIEF PATIENT DESCRIPTION: 83 y/o F admitted with SOB / AMS.  PMH of ESRD on HD T, TH, Sat, COPD, PVD, dementia, stroke, CAD, T2DM, asthma. CTA chest negative for PE but showed LLL infiltrate and large right pleural effusion with RML/RLL collapse.  S/P R thora 5/15.    SUBJECTIVE:  Confused, unable to tolerate ct scan.  VITAL SIGNS: Temp:  [97.6 F (36.4 C)-98.4 F (36.9 C)] 97.6 F (36.4 C) (05/17 1056) Pulse Rate:  [97-110] 101 (05/17 1056) Resp:  [12-28] 22 (05/17 0743) BP: (99-143)/(45-77) 132/62 (05/17 1056) SpO2:  [91 %-100 %] 98 % (05/17 1056) Weight:  [146 lb 1.6 oz (66.3 kg)] 146 lb 1.6 oz (66.3 kg) (05/17 0400)  PHYSICAL EXAMINATION: General:  Confused female in NAD HEENT: MM pink/moist WUJ:[id number] Neuro: Confused , follows some commands  CV:HSD PULM: Decreased bs bases GN:FAOZ, non-tender, bsx4 active  Extremities: warm/dry, + edema  Skin: no rashes or lesions    Recent Labs Lab 01/05/17 0452 01/06/17 0443 01/07/17 0515  NA 132* 135 138  K 3.5 3.7 3.7  CL 94* 96* 95*  CO2 23 25 23   BUN 51* 21* 33*  CREATININE 8.00* 4.39* 6.60*  GLUCOSE 394* 216* 185*    Recent Labs Lab 01/05/17 0452 01/06/17 0443 01/07/17 0515  HGB 9.1* 8.1* 8.7*  HCT 28.3* 25.1* 28.2*  WBC 22.4* 17.6* 16.3*  PLT 247 223 250   Ct Head Wo Contrast  Result Date: 01/06/2017 CLINICAL DATA:  Aphasia, history of stroke and hypertension EXAM: CT HEAD WITHOUT CONTRAST TECHNIQUE: Contiguous axial images were obtained from the base of the skull through the vertex without intravenous contrast. COMPARISON:  11/04/2010 FINDINGS: Brain: Left parietal encephalomalacia is noted consistent with a chronic left MCA distribution infarct since 2012. Hypodensity in the left  caudate head is also noted which may reflect a remote lacunar infarct as well. Chronic appearing small vessel ischemic changes are otherwise noted bilaterally. Sulcal and ventricular prominence is noted consistent with atrophy. No acute intracranial hemorrhage, midline shift or edema. Cerebellum is intact. No effacement of the basal cisterns or fourth ventricle. Vascular: Moderate bilateral carotid siphon calcifications. No hyperdense vessels. Skull: Maxillary sinus wall thickening consistent with chronic sinusitis. No acute skull fracture nor suspicious lesions. Sinuses/Orbits: Mild-to-moderate ethmoid, sphenoid and right maxillary sinus mucosal thickening. Moderate to severe mucosal thickening of the visualized left maxillary sinus. The frontal sinus appears clear. The orbits are unremarkable. The globes are symmetric in appearance with evidence of bilateral prior lens replacement. Other: None IMPRESSION: 1. Remote left MCA distribution infarct with parietal lobe encephalomalacia. Probable remote left caudate head lacunar infarct. Cerebral atrophy with chronic small vessel ischemia. 2. No acute intracranial abnormality is identified. 3. Chronic paranasal sinusitis. Electronically Signed   By: Tollie Eth M.D.   On: 01/06/2017 20:32   Dg Chest Port 1 View  Result Date: 01/06/2017 CLINICAL DATA:  Hypoxia, history of hypertension, dialysis dependent renal failure EXAM: PORTABLE CHEST 1 VIEW COMPARISON:  Portable chest x-ray of Jan 05, 2017 FINDINGS: There remains a large right pleural effusion. Right basilar atelectasis or pneumonia is present and more conspicuous today. On the left there is increased density at the lung base which is stable. There is small left pleural effusion. The left mid and  upper lung are clear. The pulmonary vascularity is mildly prominent. There is calcification in the wall of the aortic arch. There are bold fractures of the lateral aspects of the sixth and seventh left ribs. IMPRESSION:  Persistent large right pleural effusion. Decreased aeration of the right lower lung worrisome for progressive atelectasis or pneumonia. Small left pleural effusion. No significant pulmonary edema. Thoracic aortic atherosclerosis. Electronically Signed   By: David  [place] M.D.   On: 01/06/2017 13:07   Dg Chest Port 1 View  Result Date: 01/05/2017 CLINICAL DATA:  Status post right thoracentesis. EXAM: PORTABLE CHEST 1 VIEW COMPARISON:  Jan 03, 2017 FINDINGS: No pneumothorax after thoracentesis. The right-sided pleural effusion remains trauma action little larger when compared to Jan 03, 2017. Effusion patchy in left base has worsened as well. No other interval changes. IMPRESSION: No pneumothorax after thoracentesis. The bilateral pleural effusions, right greater than left, are larger when compared to Jan 03, 2017. Electronically Signed   By: Gerome Sam III M.D   On: 01/05/2017 14:54   SIGNIFICANT EVENTS  5/13  Admit to FPTS for SOB, AMS 5/14  CTA negative for PE, + for large right pleural effusion and LLL infiltrate  STUDIES:  CTA Chest 5/14 >> Negative for PE, Large right pleural effusion layering dependently with complete collapse of the right lower lobe, and near complete collapse of the right middle lobe. Dependent atelectasis in the upper lobe. Patchy pneumonia left lower lobe.  Left lower lobe bronchiectasis. Pleural Cytology 5/15 >> CT Head 5/16 >>  negative  CULTURES: BCx2 5/14 >>  RVP 5/14 >> negative R pleural fluid 5/15 >>     ASSESSMENT / PLAN:  Acute Hypoxic Respiratory Failure - multifactorial in the setting of LLL CAP, bronchiectasis, underlying COPD, pulmonary HTN (36 mm Hg 5 years ago) and large right pleural effusion.  S/P thora with 300 ml pleural fluid.  Exudative by LDH (ratio 2).    Plan: Follow plural cultures / cytology  Continue duonebs + PRN albuterol  Vanco / Zosyn per primary SVC  Wean O2 for sats > 90% Follow cultures / PCT and narrow antibiotics as  able  Intermittent CXR  Aggressive pulmonary hygiene - IS, early mobilization Follow up ECHO Now a DNR along with confusion. Cytology pending from thora but as she is a DNR the findings are mute. PCCM will sign off.  Brett Canales Minor ACNP [facility]ack PCCM Pager [phone] till 3 pm If no answer page [phone] 01/07/2017, 12:19 PM  Attending Note:  83 year old female with ESRD who presents to the hospital with AMS and SOB. CT of the chest was done that I reviewed myself and pleural effusion was noted. Patient is confused but cooperative on exam with decreased BS on the right. Requiring 2L [place]. PCCM consulted for thoracentesis. Discussed with renal and PCCM-NP.  Effusion exudative and cytology is inflammatory with no cancer.  Pleural effusion: Thora performed, exudative.  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 - Cytology with inflammatory cells - Fluid even  Hypoxemia: - Titrate o2 for sat of 88-92% - May need home O2, will need an ambulatory desaturation study to see if qualifies for home O2.  Pulmonary HTN: 36 mmHg 5 years ago - I do not see results for repeat echo, may be a consideration only if a candidate for treatment which I doubt in this case.  Would recommend more of a palliative approach.  SVC syndrome: - No  surgical intervention given age and frailty. - Palliative care seeing, recommend palliative approach  PCCM will sign off, please call back if needed.  Patient seen and examined, agree with above note. I dictated the care and orders written for this patient under my direction.  Alyson ReedyYacoub, Omar Gayden G, MD [phone]

## 2017-01-07 NOTE — Progress Notes (Signed)
Physical Therapy Treatment Patient Details Name: Claudia Rich MRN: [id number] DOB: Sep 19, 1926 Today's Date: 01/07/2017    History of Present Illness 83 yo female admitted with AMS  LLL infiltrate and L R pleural effusion with RML/ RLL collapse. PMH: ESRD TTHSAT COPD, PVD, dementia, CAD. DM. asthma    PT Comments    Pt had CT yesterday, 01-06-17, which revealed remote left MCA distribution infarct with parietal lobe Encephalomalacia and probable remote left caudate head lacunar infarct. Pt more alert today and able to participate with PT. She recognized family. She was able to say hello to them and call them by name. Pt was unable to tolerate HD Tues due to BP.  Unsure of her medical prognosis as her HD is currently on hold.   Follow Up Recommendations  SNF     Equipment Recommendations  Rolling walker with 5" wheels;3in1 (PT)    Recommendations for Other Services OT consult     Precautions / Restrictions Precautions Precautions: Fall;Other (comment) Precaution Comments: Watch O2 sats closely    Mobility  Bed Mobility   Bed Mobility: Rolling Rolling: Modified independent (Device/Increase time)   Supine to sit: Mod assist;HOB elevated Sit to supine: Mod assist   General bed mobility comments: Pt prefers long sitting in bed.  Transfers   Equipment used: Rolling walker (2 wheeled)   Sit to Stand: +2 safety/equipment;Mod assist         General transfer comment: Multi sit to stand bedside with RW. Pt able to maintain static stand with RW and min assist approx 30 seconds. Pt shaky with stance. Able to sidestep 3 feet with RW and +2 mod assist for better positioning with sit to supine.  Ambulation/Gait             General Gait Details: unable    Stairs            Wheelchair Mobility    Modified Rankin (Stroke Patients Only) Modified Rankin (Stroke Patients Only) Pre-Morbid Rankin Score: No significant disability Modified Rankin: Moderately severe  disability     Balance Overall balance assessment: Needs assistance Sitting-balance support: No upper extremity supported;Feet supported Sitting balance-Leahy Scale: Good     Standing balance support: Bilateral upper extremity supported;During functional activity Standing balance-Leahy Scale: Poor                              Cognition Arousal/Alertness: Awake/alert Behavior During Therapy: Flat affect Overall Cognitive Status: Impaired/Different from baseline Area of Impairment: Following commands                       Following Commands: Follows one step commands with increased time;Follows one step commands inconsistently       General Comments: Very minimal verbalizations. RN gave meds in applesauce just prior to Rx. Pt unable to swallow, having to spit out all meds and applesauce after 15-20 minutes.       Exercises      General Comments        Pertinent Vitals/Pain Pain Assessment: Faces Faces Pain Scale: No hurt    Home Living                      Prior Function            PT Goals (current goals can now be found in the care plan section) Acute Rehab PT Goals Patient Stated Goal: not stated PT  Goal Formulation: Patient unable to participate in goal setting Time For Goal Achievement: 01/18/17 Potential to Achieve Goals: Fair Progress towards PT goals: Progressing toward goals    Frequency    Min 3X/week      PT Plan Current plan remains appropriate    Co-evaluation              AM-PAC PT "6 Clicks" Daily Activity  Outcome Measure  Difficulty turning over in bed (including adjusting bedclothes, sheets and blankets)?: None Difficulty moving from lying on back to sitting on the side of the bed? : Total Difficulty sitting down on and standing up from a chair with arms (e.g., wheelchair, bedside commode, etc,.)?: Total Help needed moving to and from a bed to chair (including a wheelchair)?: A Lot Help needed  walking in hospital room?: A Lot Help needed climbing 3-5 steps with a railing? : Total 6 Click Score: 11    End of Session Equipment Utilized During Treatment: Gait belt;Oxygen Activity Tolerance: Other (comment) (Pt unable to swallow meds, pocketing throughout Rx. ) Patient left: in bed;with bed alarm set;with call M[place]ar[place]/phone within reach;with family/visitor present Nurse Communication: Mobility status PT Visit Diagnosis: Unsteadiness on feet (R26.81);Muscle weakness (generalized) (M62.81);Other (comment)     Time: 1610-9604 PT Time Calculation (min) (ACUTE ONLY): 31 min  Charges:  $Therapeutic Activity: 23-37 mins                    G Codes:       Aida Raider, PT  Office # [phone] Pager [phone]    Ilda Foil 01/07/2017, 10:27 AM

## 2017-01-08 LAB — CBC
HCT: 27.4 % — ABNORMAL LOW (ref 36.0–46.0)
HEMOGLOBIN: 8.5 g/dL — AB (ref 12.0–15.0)
MCH: 30.8 pg (ref 26.0–34.0)
MCHC: 31 g/dL (ref 30.0–36.0)
MCV: 99.3 fL (ref 78.0–100.0)
PLATELETS: 273 10*3/uL (ref 150–400)
RBC: 2.76 MIL/uL — AB (ref 3.87–5.11)
RDW: 17.3 % — ABNORMAL HIGH (ref 11.5–15.5)
WBC: 15.2 10*3/uL — AB (ref 4.0–10.5)

## 2017-01-08 LAB — CULTURE, BLOOD (ROUTINE X 2)
Culture: NO GROWTH
Culture: NO GROWTH
Special Requests: ADEQUATE
Special Requests: ADEQUATE

## 2017-01-08 LAB — CHOLESTEROL, BODY FLUID: Cholesterol, Fluid: 74 mg/dL

## 2017-01-08 LAB — BASIC METABOLIC PANEL
ANION GAP: 13 (ref 5–15)
BUN: 23 mg/dL — AB (ref 6–20)
CO2: 25 mmol/L (ref 22–32)
Calcium: 9.1 mg/dL (ref 8.9–10.3)
Chloride: 95 mmol/L — ABNORMAL LOW (ref 101–111)
Creatinine, Ser: 4.66 mg/dL — ABNORMAL HIGH (ref 0.44–1.00)
GFR, EST AFRICAN AMERICAN: 9 mL/min — AB (ref >60)
GFR, EST NON AFRICAN AMERICAN: 8 mL/min — AB (ref >60)
Glucose, Bld: 360 mg/dL — ABNORMAL HIGH (ref 65–99)
POTASSIUM: 4 mmol/L (ref 3.5–5.1)
SODIUM: 133 mmol/L — AB (ref 135–145)

## 2017-01-08 LAB — GLUCOSE, CAPILLARY
GLUCOSE-CAPILLARY: 341 mg/dL — AB (ref 65–99)
Glucose-Capillary: 155 mg/dL — ABNORMAL HIGH (ref 65–99)
Glucose-Capillary: 331 mg/dL — ABNORMAL HIGH (ref 65–99)
Glucose-Capillary: 339 mg/dL — ABNORMAL HIGH (ref 65–99)

## 2017-01-08 MED ORDER — INSULIN ASPART 100 UNIT/ML ~~LOC~~ SOLN
4.0000 [IU] | Freq: Once | SUBCUTANEOUS | Status: AC
  Administered 2017-01-08: 4 [IU] via SUBCUTANEOUS

## 2017-01-08 MED ORDER — METOPROLOL TARTRATE 25 MG PO TABS
25.0000 mg | ORAL_TABLET | Freq: Two times a day (BID) | ORAL | Status: DC
  Administered 2017-01-08 – 2017-01-16 (×15): 25 mg via ORAL
  Filled 2017-01-08 (×15): qty 1

## 2017-01-08 NOTE — Progress Notes (Signed)
Family Medicine Teaching Service Daily Progress Note Intern Pager: [phone]  Patient name: Claudia Rich Medical record number: [id number] Date of birth: June 02, 1927 Age: 83 y.o. Gender: female  Primary Care Provider: Araceli Boucheumley, [place] N, DO Consultants: Renal Code Status: Full  Pt Overview and Major Events to Date:  5/13 admit to FPTS  Assessment and Plan: Claudia Ridgesddie R Shockley is a 83 y.o. female presenting with SOB, AMS . PMH is significant for ESRD on HD, dementia, CAD, T2DM, asthma.  #SOB with Hypoxia Patient is day #3 s/p thoracentesis by pulmonology with 300 ml cloudy pleural fluid obtained. Loculated pocket of fluid unable to be removed. Currently on Vancomycin and Zosyn. Patient is still requiring 5L [place] to maintain O2 sat in the low to mid 90's. Blood  and pleural fluid cultures have shown no growth to date. Patient is now DNR after discussion between Dr. Pollie MeyerMcIntyre and POA.  --Continue telemetry --Continue Vancomycin 750 mg IV DAY 6 ( start 5/13 - ) --Continue Zosyn3.375 g bid DAY 4 (start 5/15 ) --O2 prn saturations <92% --Will try to wean off oxygen   #AMS, improving Patient's mental status continue to improve. CT scan was unremarkable and showed evidence of old infarct. Supportive family with goal to transition to SNF if status continue to improve. Discussion engaged with palliative care and primary team about GOCs. --Will continue to monitor --Palliative care following appreciate recs   #HTN  BP 147/64. Patient is supposed to be on norvasc 10 mg, metoprolol 25 mg  but appears that she has not been getting her Norvasc for the past two days. Patient has been cleared by speech to have meds crushed in pudding. Will address with nursing staff --Continue amlodipine 10 mg daily --Continue metoprolol IV 2.5 mg q6  #ESRD on HD  patient is on Tu,Th, Sat schedule. Had HD session yesterday with no complications. --Follow up on nephrology recs  #DM2 11/2016 A1c 7.9. On Januvia at home. --sSSI  while hospitalized --BG checks qAC   #Pressure wound Patient has stage II pressure ulcer wound on the right buttock. Appears dry and is being assess regularly by nursing. Patient endorsed some discomfort yesterday and requested pain meds after tylenol did not provide any relief. --Would consult wound care as needed --Will follow up on pain   #GERD w/ h/o peptic ulcer --Continue pantoprazole 40 mg daily    #Oral Thrush Patient started nystatin, will follow  FEN/GI: Dysphagia 1 diet thin liquid med crushed in pudding, HH/Carb mod renal diet, Protonix Prophylaxis: Lovenox subq renal dosed    Disposition: Home pending transition to PO Abx  Subjective:  Patient continue to have improve mental status, and is much more coherent this morning. She has been asking about her sister and feels she is lost in this place.   Objective: Temp:  [97.6 F (36.4 C)-98 F (36.7 C)] 97.9 F (36.6 C) (05/18 0421) Pulse Rate:  [101-114] 114 (05/18 0421) Resp:  [22-109] 25 (05/18 0421) BP: (99-147)/(30-77) 147/64 (05/18 0421) SpO2:  [91 %-100 %] 92 % (05/18 0421) Weight:  [121 lb 11.1 oz (55.2 kg)-149 lb 4 oz (67.7 kg)] 149 lb 4 oz (67.7 kg) (05/18 0421)   Physical Exam:  General: patient appears uncomfortable, breathing comfortably Cardiovascular: RRR, no m/r/g Respiratory: comfortable work of breathing, right lower lobe expiratory wheezing, rest of lung exam is within normal limit. Abdomen: soft and nontender, nondistended Extremities: no LE edema Psych: AAOx0/4  Laboratory:  Recent Labs Lab 01/05/17 0452 01/06/17 0443 01/07/17 0515  WBC 22.4* 17.6* 16.3*  HGB 9.1* 8.1* 8.7*  HCT 28.3* 25.1* 28.2*  PLT 247 223 250    Recent Labs Lab 01/05/17 0452 01/05/17 1653 01/06/17 0443 01/07/17 0515  NA 132*  --  135 138  K 3.5  --  3.7 3.7  CL 94*  --  96* 95*  CO2 23  --  25 23  BUN 51*  --  21* 33*  CREATININE 8.00*  --  4.39* 6.60*  CALCIUM 9.3  --  8.8* 9.2  PROT  --  8.1  --    --   GLUCOSE 394*  --  216* 185*     Imaging/Diagnostic Tests: Dg Chest 2 View: 01/03/2017 FINDINGS: Moderate right pleural effusion. Associated right lower lobe opacity, likely compressive atelectasis. No frank interstitial edema.  No pneumothorax. The heart is normal in size. Vascular stent overlying the mediastinum. Old left rib fracture deformities.  IMPRESSION: Moderate right pleural effusion. Associated right lower lobe opacity, likely compressive atelectasis.   Ct Angio Chest Pe W Or Wo Contrast  01/04/2017 IMPRESSION: Large right pleural effusion layering dependently with subtotal collapse of the right lung. Patchy pneumonia left lower lobe.  Left lower lobe bronchiectasis. 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 Aortic and coronary artery calcification. Multiple venous collaterals suggest stenosis of the the SVC at the SVC innominate junction. Innominate stent in place. Uncertain if this is patent.    Lovena Neighbours, MD 01/08/2017, 6:40 AM PGY-1, [facility] Family Medicine FPTS Intern pager: [phone], text pages welcome

## 2017-01-08 NOTE — Progress Notes (Signed)
[facility] Progress Note   Subjective: no new c/o, no new SOB .   Vitals:   01/07/17 2134 01/08/17 0034 01/08/17 0421 01/08/17 0800  BP: 128/60 (!) 127/51 (!) 147/64 111/64  Pulse: (!) 110 (!) 108 (!) 114 (!) 103  Resp: (!) 24  (!) 25 (!) 28  Temp: 97.9 F (36.6 C) 97.8 F (36.6 C) 97.9 F (36.6 C) 97.8 F (36.6 C)  TempSrc: Oral Oral Oral Oral  SpO2: 99% 100% 92% 100%  Weight:   67.7 kg (149 lb 4 oz)   Height:        Inpatient medications: . amLODipine  10 mg Oral QPM  . aspirin  81 mg Oral q morning - 10a  . atorvastatin  80 mg Oral QHS  . Chlorhexidine Gluconate Cloth  6 each Topical Q0600  . darbepoetin (ARANESP) injection - DIALYSIS  100 mcg Intravenous Q Tue-HD  . doxercalciferol  2 mcg Intravenous Q T,Th,Sa-HD  . feeding supplement (PRO-STAT SUGAR FREE 64)  30 mL Oral BID  . ferric citrate  420 mg Oral TID WC  . insulin aspart  0-9 Units Subcutaneous TID WC  . loratadine  10 mg Oral Daily  . mouth rinse  15 mL Mouth Rinse BID  . metoprolol tartrate  2.5 mg Intravenous Q6H  . multivitamin  1 tablet Oral QHS  . mupirocin ointment  1 application Nasal BID  . nystatin  5 mL Oral QID  . pantoprazole  40 mg Oral Daily  . sodium chloride flush  3 mL Intravenous Q12H   . piperacillin-tazobactam (ZOSYN)  IV Stopped (01/08/17 0549)  . vancomycin Stopped (01/07/17 2030)   acetaminophen **OR** acetaminophen, albuterol, docusate sodium, polyethylene glycol  Exam: Awake, alert, responding to questions, oreinted to "hospital", still weak Chest bronchial BS 2/3 up on R, L side slight basilar rales RRR no mrg Abd soft no ascites Ext no edema Nonfocal, Ox 2  [facility], BFR 400, DFR A1.5, 2K/2.25Ca, Profile 4, EDW 67.5kg, AVG - Heparin 2000 unit bolus q HD - Hectoral 2mcg IV q HD - Mircera 150mcg q 2 weeks (just reordered, last given 11/24/16)      Assessment: 1. Hypoxemia/ large RLL PNA/ exudative R pleural effusion - on IV abx,  not surg candidate.  2. Volume - no vol excess on exam /CXR, at dry wt 3. ESRD HD TTS 4. AMS - due to infection on background of dementia 5. DM on po meds at home, SSI here 6. Anemia of CKD - starting ESA here 7. DNR  Plan - still looks frail and has a way to go.  HD tomorrow.    Vinson Moselleob Taheerah Guldin MD [facility] pager [phone]   01/08/2017, 1:25 PM    Recent Labs Lab 01/05/17 0452 01/06/17 0443 01/07/17 0515  NA 132* 135 138  K 3.5 3.7 3.7  CL 94* 96* 95*  CO2 23 25 23   GLUCOSE 394* 216* 185*  BUN 51* 21* 33*  CREATININE 8.00* 4.39* 6.60*  CALCIUM 9.3 8.8* 9.2  PHOS 4.2 2.9 3.7    Recent Labs Lab 01/05/17 0452 01/05/17 1653 01/06/17 0443 01/07/17 0515  PROT  --  8.1  --   --   ALBUMIN 2.3*  --  2.1* 2.1*    Recent Labs Lab 01/05/17 0452 01/06/17 0443 01/07/17 0515  WBC 22.4* 17.6* 16.3*  HGB 9.1* 8.1* 8.7*  HCT 28.3* 25.1* 28.2*  MCV 97.9 97.7 99.3  PLT 247 223 250  Iron/TIBC/Ferritin/ %Sat    Component Value Date/Time   IRON 84 12/14/2014 1443   TIBC 216 (L) 12/14/2014 1443   FERRITIN 1,385 (H) 12/14/2014 1443   IRONPCTSAT 39 12/14/2014 1443

## 2017-01-08 NOTE — Progress Notes (Signed)
Physical Therapy Treatment Patient Details Name: Claudia Rich MRN: [id number] DOB: 08/30/1926 Today's Date: 01/08/2017    History of Present Illness 83 yo female admitted with AMS  LLL infiltrate and L R pleural effusion with RML/ RLL collapse. PMH: ESRD TTHSAT COPD, PVD, dementia, CAD. DM. asthma    PT Comments    Pt looks much better today. She is alert and conversive. Very pleasand and agreeable to therapy. Muscular weakness and fatigue continue to be her limiting factors. 5 L O2 briefly removed for transition bed to chair with desat to 80%. Pt quickly recovered to 93% when [place] replaced in recliner. Pt reclined in bedside recliner with call button at end of session.   Follow Up Recommendations  SNF     Equipment Recommendations  Rolling walker with 5" wheels;3in1 (PT)    Recommendations for Other Services       Precautions / Restrictions Precautions Precautions: Fall;Other (comment) Precaution Comments: Watch O2 sats    Mobility  Bed Mobility     Rolling: Modified independent (Device/Increase time)   Supine to sit: Mod assist;HOB elevated     General bed mobility comments: multi modal cues for sequencing  Transfers   Equipment used: Rolling walker (2 wheeled)   Sit to Stand: Mod assist;+2 safety/equipment         General transfer comment: verbal cues for hand placement  Ambulation/Gait Ambulation/Gait assistance: Min assist;+2 safety/equipment Ambulation Distance (Feet): 3 Feet Assistive device: Rolling walker (2 wheeled) Gait Pattern/deviations: Decreased stride length;Step-through pattern Gait velocity: decreased Gait velocity interpretation: Below normal speed for age/gender General Gait Details: slow, short steps for bed to recliner transfer   Stairs            Wheelchair Mobility    Modified Rankin (Stroke Patients Only) Modified Rankin (Stroke Patients Only) Pre-Morbid Rankin Score: No significant disability Modified Rankin: Moderately  severe disability     Balance   Sitting-balance support: Feet supported;No upper extremity supported Sitting balance-Leahy Scale: Good     Standing balance support: Bilateral upper extremity supported;During functional activity Standing balance-Leahy Scale: Poor                              Cognition Arousal/Alertness: Awake/alert Behavior During Therapy: WFL for tasks assessed/performed (labile) Overall Cognitive Status: Impaired/Different from baseline Area of Impairment: Following commands;Orientation                 Orientation Level: Disoriented to;Situation;Time     Following Commands: Follows one step commands with increased time       General Comments: Pt more alert and vocal today.      Exercises      General Comments        Pertinent Vitals/Pain Pain Assessment: Faces Faces Pain Scale: Hurts little more Pain Location: back Pain Descriptors / Indicators: Grimacing Pain Intervention(s): Monitored during session;Repositioned    Home Living                      Prior Function            PT Goals (current goals can now be found in the care plan section) Acute Rehab PT Goals Patient Stated Goal: not stated PT Goal Formulation: Patient unable to participate in goal setting Time For Goal Achievement: 01/18/17 Potential to Achieve Goals: Fair Progress towards PT goals: Progressing toward goals    Frequency    Min 3X/week  PT Plan Current plan remains appropriate    Co-evaluation              AM-PAC PT "6 Clicks" Daily Activity  Outcome Measure  Difficulty turning over in bed (including adjusting bedclothes, sheets and blankets)?: None Difficulty moving from lying on back to sitting on the side of the bed? : Total Difficulty sitting down on and standing up from a chair with arms (e.g., wheelchair, bedside commode, etc,.)?: Total Help needed moving to and from a bed to chair (including a wheelchair)?: A  Little Help needed walking in hospital room?: A Little Help needed climbing 3-5 steps with a railing? : A Lot 6 Click Score: 14    End of Session Equipment Utilized During Treatment: Gait belt;Oxygen (5 L) Activity Tolerance: Patient tolerated treatment well Patient left: in chair;with chair alarm set;with call Meany/phone within reach Nurse Communication: Mobility status PT Visit Diagnosis: Unsteadiness on feet (R26.81);Muscle weakness (generalized) (M62.81);Other (comment)     Time: 4696-2952 PT Time Calculation (min) (ACUTE ONLY): 18 min  Charges:  $Therapeutic Activity: 8-22 mins                    G Codes:       Aida Raider, PT  Office # [phone] Pager [phone]    Ilda Foil 01/08/2017, 9:51 AM

## 2017-01-09 LAB — BASIC METABOLIC PANEL
Anion gap: 15 (ref 5–15)
BUN: 31 mg/dL — AB (ref 6–20)
CALCIUM: 9.4 mg/dL (ref 8.9–10.3)
CO2: 25 mmol/L (ref 22–32)
CREATININE: 5.6 mg/dL — AB (ref 0.44–1.00)
Chloride: 95 mmol/L — ABNORMAL LOW (ref 101–111)
GFR calc Af Amer: 7 mL/min — ABNORMAL LOW (ref >60)
GFR, EST NON AFRICAN AMERICAN: 6 mL/min — AB (ref >60)
Glucose, Bld: 300 mg/dL — ABNORMAL HIGH (ref 65–99)
POTASSIUM: 3.4 mmol/L — AB (ref 3.5–5.1)
SODIUM: 135 mmol/L (ref 135–145)

## 2017-01-09 LAB — CBC
HCT: 27.1 % — ABNORMAL LOW (ref 36.0–46.0)
Hemoglobin: 8.6 g/dL — ABNORMAL LOW (ref 12.0–15.0)
MCH: 31.5 pg (ref 26.0–34.0)
MCHC: 31.7 g/dL (ref 30.0–36.0)
MCV: 99.3 fL (ref 78.0–100.0)
PLATELETS: 274 10*3/uL (ref 150–400)
RBC: 2.73 MIL/uL — AB (ref 3.87–5.11)
RDW: 17.7 % — ABNORMAL HIGH (ref 11.5–15.5)
WBC: 16.8 10*3/uL — ABNORMAL HIGH (ref 4.0–10.5)

## 2017-01-09 LAB — GLUCOSE, CAPILLARY
GLUCOSE-CAPILLARY: 312 mg/dL — AB (ref 65–99)
Glucose-Capillary: 306 mg/dL — ABNORMAL HIGH (ref 65–99)
Glucose-Capillary: 274 mg/dL — ABNORMAL HIGH (ref 65–99)

## 2017-01-09 LAB — ADENOSIDE DEAMINASE, PLEURAL FL: ADENOSIDE DEAMINASE, PLEURAL FL: 5.3 U/L (ref 0.0–9.4)

## 2017-01-09 LAB — BODY FLUID CULTURE: Culture: NO GROWTH

## 2017-01-09 MED ORDER — VANCOMYCIN HCL IN DEXTROSE 750-5 MG/150ML-% IV SOLN
INTRAVENOUS | Status: AC
  Administered 2017-01-09: 750 mg via INTRAVENOUS
  Filled 2017-01-09: qty 150

## 2017-01-09 MED ORDER — HYDROXYZINE HCL 10 MG PO TABS
10.0000 mg | ORAL_TABLET | Freq: Once | ORAL | Status: AC
  Administered 2017-01-09: 10 mg via ORAL
  Filled 2017-01-09: qty 1

## 2017-01-09 MED ORDER — DOXERCALCIFEROL 4 MCG/2ML IV SOLN
INTRAVENOUS | Status: AC
  Administered 2017-01-09: 2 ug via INTRAVENOUS
  Filled 2017-01-09: qty 2

## 2017-01-09 NOTE — Progress Notes (Signed)
[facility] Progress Note   Subjective: no new c/o, no new SOB .   Vitals:   01/09/17 0300 01/09/17 0315 01/09/17 0439 01/09/17 0754  BP:   (!) 133/50 (!) 106/58  Pulse: (!) 104 (!) 28 (!) 103 98  Resp: (!) 21 (!) 26 (!) 25 (!) 27  Temp:   98.8 F (37.1 C) 98.5 F (36.9 C)  TempSrc:    Oral  SpO2: (!) 80% 99% 100% 100%  Weight:   67.4 kg (148 lb 11.2 oz)   Height:        Inpatient medications: . amLODipine  10 mg Oral QPM  . aspirin  81 mg Oral q morning - 10a  . atorvastatin  80 mg Oral QHS  . darbepoetin (ARANESP) injection - DIALYSIS  100 mcg Intravenous Q Tue-HD  . doxercalciferol  2 mcg Intravenous Q T,Th,Sa-HD  . feeding supplement (PRO-STAT SUGAR FREE 64)  30 mL Oral BID  . ferric citrate  420 mg Oral TID WC  . insulin aspart  0-9 Units Subcutaneous TID WC  . loratadine  10 mg Oral Daily  . mouth rinse  15 mL Mouth Rinse BID  . metoprolol tartrate  25 mg Oral BID  . multivitamin  1 tablet Oral QHS  . mupirocin ointment  1 application Nasal BID  . nystatin  5 mL Oral QID  . pantoprazole  40 mg Oral Daily  . sodium chloride flush  3 mL Intravenous Q12H   . piperacillin-tazobactam (ZOSYN)  IV 3.375 g (01/09/17 0200)  . vancomycin Stopped (01/07/17 2030)   acetaminophen **OR** acetaminophen, albuterol, docusate sodium, polyethylene glycol  Exam: Awake, alert, responding to questions, oreinted to "hospital", still weak Chest bronchial BS 2/3 up on R, new pleural friction rub; slight basilar crackles on L RRR no mrg Abd soft no ascites Ext no edema Nonfocal, Ox 2  Dialysis: TTS at [facility] 3:45hrs, BFR 400, DFR A1.5, 2K/2.25Ca, Profile 4, EDW 67.5kg, AVG - Heparin 2000 unit bolus q HD - Hectoral IV q HD - Mircera q 2 weeks (just reordered, last given 11/24/16)      Assessment: 1. Hypoxemia/ large RLL PNA/ exudative R pleural effusion - on IV abx, not surg candidate. Not sure she will be able to resolve this significant infection, very  old and frail.   2. Volume - no vol excess on exam /CXR, at dry wt 3. ESRD HD TTS 4. AMS - due to infection on background of dementia 5. DM on po meds at home, SSI here 6. Anemia of CKD - starting ESA here 7. DNR  Plan - HD today   Vinson Moselle MD [facility] pager [phone]   01/09/2017, 11:21 AM    Recent Labs Lab 01/05/17 0452 01/06/17 0443 01/07/17 0515 01/08/17 1340 01/09/17 0308  NA 132* 135 138 133* 135  K 3.5 3.7 3.7 4.0 3.4*  CL 94* 96* 95* 95* 95*  CO2 23 25 23 25 25   GLUCOSE 394* 216* 185* 360* 300*  BUN 51* 21* 33* 23* 31*  CREATININE 8.00* 4.39* 6.60* 4.66* 5.60*  CALCIUM 9.3 8.8* 9.2 9.1 9.4  PHOS 4.2 2.9 3.7  --   --     Recent Labs Lab 01/05/17 0452 01/05/17 1653 01/06/17 0443 01/07/17 0515  PROT  --  8.1  --   --   ALBUMIN 2.3*  --  2.1* 2.1*    Recent Labs Lab 01/07/17 0515 01/08/17 1340 01/09/17 0308  WBC 16.3* 15.2* 16.8*  HGB 8.7* 8.5* 8.6*  HCT 28.2* 27.4* 27.1*  MCV 99.3 99.3 99.3  PLT 250 273 274   Iron/TIBC/Ferritin/ %Sat    Component Value Date/Time   IRON 84 12/14/2014 1443   TIBC 216 (L) 12/14/2014 1443   FERRITIN 1,385 (H) 12/14/2014 1443   IRONPCTSAT 39 12/14/2014 1443

## 2017-01-09 NOTE — Plan of Care (Signed)
Problem: Fluid Volume: Goal: Ability to maintain a balanced intake and output will improve Outcome: Progressing Pt for Dialysis today.

## 2017-01-09 NOTE — Progress Notes (Signed)
HD tx completed @ 8841 w/o problem, UF goal met (kept even per MD orders), blood rinsed back, report called to Boyd Kerbs, RN

## 2017-01-09 NOTE — Progress Notes (Signed)
Clinical Social Worker spoke to patients niece/POA in regards to facility placement. [name] (niece) stated her and her sister have chosen [facility]. CSW reached out to admission coordinator for [facility] Loser(Rhonda) and she stated they are able to take patient Monday . Patient has [employer] as a Editor, commissioningprimary insurance and authorization for insurance needs to be started and attained before [facility] can take [facility].  Marrianne MoodAshley Krystine Pabst, MSW,  [employer]CSWA [phone]

## 2017-01-09 NOTE — Progress Notes (Signed)
Family Medicine Teaching Service Daily Progress Note Intern Pager: [phone]  Patient name: Claudia Rich Medical record number: [id number] Date of birth: Jan 10, 1927 Age: 83 y.o. Gender: female  Primary Care Provider: Araceli Boucheumley, [place] N, DO Consultants: Renal Code Status: Full  Pt Overview and Major Events to Date:  5/13 admit to FPTS  Assessment and Plan: Claudia Ridgesddie R Junco is a 83 y.o. female presenting with SOB, AMS . PMH is significant for ESRD on HD, dementia, CAD, T2DM, asthma.  #SOB with Hypoxia Patient is day #4 s/p thoracentesis by pulmonology with 300 ml cloudy pleural fluid obtained. Loculated pocket of fluid unable to be removed. Currently on Vancomycin and Zosyn. Patient was on non rebreather 6L for desaturation into the 80's. Patient O2 saturation return to the high 90's. Patient is back on 5L [place] this morning and maintaining good oxygen saturation. Blood and pleural fluid cultures have shown no growth to date. Will consider transition to Levaquin today after EKG to assess prolonged QT Patient is now DNR after discussion between Dr. Pollie MeyerMcIntyre and POA.  --Continue telemetry --Continue Vancomycin 750 mg IV DAY 7 ( start 5/13- ) --Continue Zosyn3.375 g bid DAY 5 (start 5/15 ) --O2 prn saturations <92% --Will try to wean off oxygen   #AMS, improving Patient's mental status continue to improve, close to baseline. rapicd change in mental status consistent with delirium. CT scan was unremarkable and showed evidence of old infarct. Supportive family with goal to transition to SNF if status continue to improve. Discussion engaged with palliative care and primary team about GOCs. --Will continue to monitor --Reorient as needed --Palliative care following appreciate recs   #HTN  BP 133/50. Patient was restarted metoprolol 25 mg and norvasc 10 mg yesterday. They were improvement in tachycardia after receiving metoprolol making us less suspicious for PE, though patient is tachycardic this  morning. Will monitor throughout the day if no change will consider CTA to rule out PE. --Continue amlodipine 10 mg daily --Continue metoprolol 25 mg bid   #ESRD on HD Patient is on Tu,Th, Sat schedule. Will have HD session today. --Follow up on nephrology recs  #DM2 11/2016 A1c 7.9. Patient's po intake continue to improve as seen with increase inshort acting insulin use. --sSSI while hospitalized --BG checks qAC/hs  #Pressure wound Patient has stage II pressure ulcer wound on the right buttock. Appears dry and is being assess regularly by nursing. Patient endorsed some discomfort yesterday and requested pain meds after tylenol did not provide any relief. --Would consult wound care as needed --Will follow up on pain   #GERD w/ h/o peptic ulcer --Continue pantoprazole 40 mg daily    #Oral Thrush Patient started nystatin, will follow  FEN/GI: Dysphagia 1 diet thin liquid med crushed in pudding, HH/Carb mod renal diet, Protonix Prophylaxis: Lovenox subq renal dosed    Disposition: Home pending transition to PO Abx  Subjective:  Patient is doing well this morning she is smiling and remember who I am. Appetite is much improved and patient able to verbalize needs. She remembered having a conversation yesterday with her niece. Overall mentation is much improved.  Objective: Temp:  [97.8 F (36.6 C)-98.8 F (37.1 C)] 98.8 F (37.1 C) (05/19 0439) Pulse Rate:  [28-110] 103 (05/19 0439) Resp:  [21-28] 25 (05/19 0439) BP: (111-157)/(50-69) 133/50 (05/19 0439) SpO2:  [80 %-100 %] 100 % (05/19 0439) Weight:  [148 lb 11.2 oz (67.4 kg)] 148 lb 11.2 oz (67.4 kg) (05/19 0439)   Physical Exam:  General: patient  appears uncomfortable, breathing comfortably Cardiovascular: RRR, no m/r/g Respiratory: comfortable work of breathing, poor air movement throughout right lung fields, moving air clearly upper and lower lobes on the left. Abdomen: soft and nontender, nondistended Extremities:  no LE edema Psych: AAOx0/4  Laboratory:  Recent Labs Lab 01/07/17 0515 01/08/17 1340 01/09/17 0308  WBC 16.3* 15.2* 16.8*  HGB 8.7* 8.5* 8.6*  HCT 28.2* 27.4* 27.1*  PLT 250 273 274    Recent Labs Lab 01/05/17 1653  01/07/17 0515 01/08/17 1340 01/09/17 0308  NA  --   < > 138 133* 135  K  --   < > 3.7 4.0 3.4*  CL  --   < > 95* 95* 95*  CO2  --   < > 23 25 25   BUN  --   < > 33* 23* 31*  CREATININE  --   < > 6.60* 4.66* 5.60*  CALCIUM  --   < > 9.2 9.1 9.4  PROT 8.1  --   --   --   --   GLUCOSE  --   < > 185* 360* 300*  < > = values in this interval not displayed.   Imaging/Diagnostic Tests: Dg Chest 2 View: 01/03/2017 FINDINGS: Moderate right pleural effusion. Associated right lower lobe opacity, likely compressive atelectasis. No frank interstitial edema.  No pneumothorax. The heart is normal in size. Vascular stent overlying the mediastinum. Old left rib fracture deformities.  IMPRESSION: Moderate right pleural effusion. Associated right lower lobe opacity, likely compressive atelectasis.   Ct Angio Chest Pe W Or Wo Contrast  01/04/2017 IMPRESSION: Large right pleural effusion layering dependently with subtotal collapse of the right lung. Patchy pneumonia left lower lobe.  Left lower lobe bronchiectasis. 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 Aortic and coronary artery calcification. Multiple venous collaterals suggest stenosis of the the SVC at the SVC innominate junction. Innominate stent in place. Uncertain if this is patent.    Lovena Neighbours, MD 01/09/2017, 7:14 AM PGY-1, [facility] FPTS Intern pager: [phone], text pages welcome

## 2017-01-09 NOTE — Progress Notes (Signed)
Pt arrived to unit @ 1431 A&O to self only, VSS, tx initiated @1442  via 15G x2 w/o problem, will cont to monitor while on HD tx

## 2017-01-09 NOTE — Progress Notes (Signed)
SLP Cancellation Note  Patient Details Name: Clent Ridgesddie R Truax MRN: [id number] DOB: 25-May-1927   Cancelled treatment:       Reason Eval/Treat Not Completed: Patient at procedure or test/unavailable. Pt leaving for dialysis. Will continue attempts.   Metro KungAmy K Sufyan Meidinger, MA, CCC-SLP 01/09/2017, 2:19 PM

## 2017-01-10 ENCOUNTER — Inpatient Hospital Stay (HOSPITAL_COMMUNITY): Payer: Medicare HMO

## 2017-01-10 DIAGNOSIS — R0902 Hypoxemia: Secondary | ICD-10-CM

## 2017-01-10 DIAGNOSIS — R627 Adult failure to thrive: Secondary | ICD-10-CM

## 2017-01-10 LAB — GLUCOSE, CAPILLARY
GLUCOSE-CAPILLARY: 293 mg/dL — AB (ref 65–99)
GLUCOSE-CAPILLARY: 312 mg/dL — AB (ref 65–99)
GLUCOSE-CAPILLARY: 212 mg/dL — AB (ref 65–99)
Glucose-Capillary: 339 mg/dL — ABNORMAL HIGH (ref 65–99)

## 2017-01-10 MED ORDER — LEVOFLOXACIN 500 MG PO TABS
500.0000 mg | ORAL_TABLET | ORAL | Status: DC
  Administered 2017-01-12: 500 mg via ORAL
  Filled 2017-01-10 (×2): qty 1

## 2017-01-10 MED ORDER — LEVOFLOXACIN 750 MG PO TABS
750.0000 mg | ORAL_TABLET | Freq: Once | ORAL | Status: AC
  Administered 2017-01-10: 750 mg via ORAL
  Filled 2017-01-10: qty 1

## 2017-01-10 MED ORDER — ENSURE ENLIVE PO LIQD: 237.0000 mL | Freq: Two times a day (BID) | ORAL | Status: DC

## 2017-01-10 NOTE — Progress Notes (Signed)
Pharmacy Antibiotic Note  Claudia Rich is a 83 y.o. female admitted on 01/03/2017 with pneumonia, lung collapse and pleural effusion. Patient has been treated with approximately 8 days of Vancomycin and zosyn and will now be narrowed.   Pharmacy has been consulted for levaquin dosing. Patient is s/p thoracentesis X 5 days. WBC 16.8 and patient afebrile.   Plan: Levaquin 750 mg X 1 load Then levaquin 500 mg every 48 hours in the evening after HD Monitor cultures and s/sx of infection Monitor length of therapy  Height: 5\' 4"  (162.6 cm) Weight: 151 lb 14.4 oz (68.9 kg) IBW/kg (Calculated) : 54.7  Temp (24hrs), Avg:97.9 F (36.6 C), Min:97.6 F (36.4 C), Max:98.3 F (36.8 C)   Recent Labs Lab 01/05/17 0452 01/06/17 0443 01/07/17 0515 01/07/17 1307 01/08/17 1340 01/09/17 0308  WBC 22.4* 17.6* 16.3*  --  15.2* 16.8*  CREATININE 8.00* 4.39* 6.60*  --  4.66* 5.60*  VANCORANDOM  --   --   --  23  --   --     Estimated Creatinine Clearance: 6.7 mL/min (A) (by C-G formula based on SCr of 5.6 mg/dL (H)).    Allergies  Allergen Reactions  . Codeine Sulfate Other (See Comments)    "It made me sick"  . Meperidine Hcl Nausea And Vomiting    REACTION: vomiting    Antimicrobials this admission: Vanc 5/13 >>5/20 * 1500 LD on 5/13 @ 2057, 750 mg (5/15, 5/17,5/19 ) Cefepime 5/13 >> 5/15 Zosyn 5/15 >>5/20 Levaquin 5/20>>  Microbiology results: 5/13 BCx >> ngf 5/13 MRSA PCR >> positive 5/15 Resp panel >> negative 5/15 Pleural fluid >> ngf  Thank you for allowing pharmacy to be a part of this patient's care.  Hillis RangeEmily A Stewart, PharmD PGY1 Pharmacy Resident Pager: [phone]  01/10/2017 12:51 PM

## 2017-01-10 NOTE — Progress Notes (Signed)
EKG done, hard copy in chart.

## 2017-01-10 NOTE — Progress Notes (Signed)
  Speech Language Pathology Treatment: Dysphagia  Patient Details Name: Clent Ridgesddie R Innocent MRN: [id number] DOB: [birth year] Today's Date: 01/10/2017 Time: [id number] SLP Time Calculation (min) (ACUTE ONLY): 10 min  Assessment / Plan / Recommendation Clinical Impression  Pt much more alert and interactive since last seen by SLP. Answering questions; smiling.  Attentive to POs; consumed solids and liquids with active mastication, brisk swallow response.  No s/s of aspiration. Recommend advancing diet to Dysphagia 2, thin liquids with assistance as needed for self-feeding.  SLP will follow briefly pending D/C to SNF.   HPI HPI: 83 yo female admitted with AMS LLL infiltrate and L R pleural effusion with RML/ RLL collapse. PMH: ESRD TTHSAT COPD, PVD, dementia, CAD. DM. asthma. Thoracentesis 5/15. Significant deterioration from baseline function. Palliative medicine is following.      SLP Plan  Continue with current plan of care       Recommendations  Diet recommendations: Dysphagia 2 (fine chop);Thin liquid Liquids provided via: Cup;Straw Medication Administration: Whole meds with puree Supervision: Staff to assist with self feeding Compensations: Minimize environmental distractions;Slow rate;Small sips/bites Postural Changes and/or Swallow Maneuvers: Seated upright 90 degrees                Oral Care Recommendations: Oral care BID SLP Visit Diagnosis: Dysphagia, unspecified (R13.10) Plan: Continue with current plan of care       GO                Blenda MountsCouture, Miesha Bachmann Laurice 01/10/2017, 12:53 PM

## 2017-01-10 NOTE — Progress Notes (Signed)
Family Medicine Teaching Service Daily Progress Note Intern Pager: [phone]  Patient name: Claudia Rich Medical record number: [id number] Date of birth: 30-Oct-1926 Age: 83 y.o. Gender: female  Primary Care Provider: Araceli Boucheumley, [place] N, DO Consultants: Renal Code Status: Full  Pt Overview and Major Events to Date:  5/13 admit to FPTS  Assessment and Plan: Claudia Ridgesddie R Brabec is a 83 y.o. female presenting with SOB, AMS . PMH is significant for ESRD on HD, dementia, CAD, T2DM, asthma.  #SOB with Hypoxia secondary to Pneumonia Patient is day #5 s/p thoracentesis by pulmonology with 300 ml cloudy pleural fluid obtained in the setting of a RML pneumonia. Loculated pocket of fluid unable to be removed. Currently on Vancomycin and Zosyn and will be transition to Levaquin today.Overnight, patient was on non rebreather for desaturation into the 80's. Patient O2 saturation return to the high 90's.  Blood and pleural fluid cultures show no growth to date.  --Continue telemetry --Start patient on Levaquin 750 mg loading dose and 500 mg q48h --Discontinue Vancomycin 750 mg IV DAY 8 ( start 5/13- ) --Discontinue Zosyn3.375 g bid DAY 6 (start 5/15 ) --O2 prn saturations <92% --Will try to wean off oxygen   #AMS, improving Patient's mental status have been improvin in the past few days, however patient was noted to be more agitated after HD session yesterday evening and needed reorientation. This morning patient was back to baseline with improved mentation. --Patient received one time dose of hydroxyzine 10 mg --Will continue to monitor --Reorient as needed --Palliative care following appreciate recs   #HTN  BP 110/48. Patient was restarted on metoprolol 25 mg and norvasc 10 mg yesterday. They were improvement in tachycardia after receiving metoprolol making us less suspicious for PE, though patient was tachycardic overnight and this morning.  --Continue amlodipine 10 mg daily --Continue metoprolol 25 mg  bid   #ESRD on HD Patient is on Tu,Th, Sat schedule.  Had a session yesterday, patient tolerated well, no complications. --Follow up on nephrology recs  #DM2 11/2016 A1c 7.9. Patient's po intake continue to improve as seen with increase inshort acting insulin use. --sSSI while hospitalized --BG checks qAC/hs  #Pressure wound Patient has stage II pressure ulcer wound on the right buttock. Appears dry and is being assess regularly by nursing. Patient endorsed some discomfort yesterday and requested pain meds after tylenol did not provide any relief. --Would consult wound care as needed --Will follow up on pain   #GERD w/ h/o peptic ulcer --Continue pantoprazole 40 mg daily    #Oral Thrush --Continue Nystatin, will reassess  FEN/GI: Dysphagia 1 diet thin liquid med crushed in pudding, HH/Carb mod renal diet, Protonix Prophylaxis: Lovenox subq renal dosed    [facility]: [facility] able [facility] patient on Monday pending insurance approval.  Subjective:  Patient is doing well this morning, smiling and able to answer basic questions.  Objective: Temp:  [97.7 F (36.5 C)-98.5 F (36.9 C)] 98.2 F (36.8 C) (05/20 0356) Pulse Rate:  [89-116] 105 (05/20 0356) Resp:  [17-33] 23 (05/20 0356) BP: (106-140)/(46-64) 110/48 (05/20 0356) SpO2:  [84 %-100 %] 94 % (05/20 0356) Weight:  [151 lb 14.4 oz (68.9 kg)] 151 lb 14.4 oz (68.9 kg) (05/19 1839)   Physical Exam:  General: patient appears uncomfortable, breathing comfortably Cardiovascular: RRR, no m/r/g Respiratory: comfortable work of breathing, poor air movement throughout right lung fields, moving air clearly upper and lower lobes on the left. Abdomen: soft  and nontender, nondistended Extremities: no LE edema Psych: AAOx0/4  Laboratory:  Recent Labs Lab 01/07/17 0515 01/08/17 1340 01/09/17 0308  WBC 16.3* 15.2* 16.8*  HGB 8.7* 8.5* 8.6*  HCT 28.2* 27.4* 27.1*  PLT 250 273 274     Recent Labs Lab 01/05/17 1653  01/07/17 0515 01/08/17 1340 01/09/17 0308  NA  --   < > 138 133* 135  K  --   < > 3.7 4.0 3.4*  CL  --   < > 95* 95* 95*  CO2  --   < > 23 25 25   BUN  --   < > 33* 23* 31*  CREATININE  --   < > 6.60* 4.66* 5.60*  CALCIUM  --   < > 9.2 9.1 9.4  PROT 8.1  --   --   --   --   GLUCOSE  --   < > 185* 360* 300*  < > = values in this interval not displayed.   Imaging/Diagnostic Tests: Dg Chest 2 View: 01/03/2017 FINDINGS: Moderate right pleural effusion. Associated right lower lobe opacity, likely compressive atelectasis. No frank interstitial edema.  No pneumothorax. The heart is normal in size. Vascular stent overlying the mediastinum. Old left rib fracture deformities.  IMPRESSION: Moderate right pleural effusion. Associated right lower lobe opacity, likely compressive atelectasis.   Ct Angio Chest Pe W Or Wo Contrast  01/04/2017 IMPRESSION: Large right pleural effusion layering dependently with subtotal collapse of the right lung. Patchy pneumonia left lower lobe.  Left lower lobe bronchiectasis. 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 Aortic and coronary artery calcification. Multiple venous collaterals suggest stenosis of the the SVC at the SVC innominate junction. Innominate stent in place. Uncertain if this is patent.    Lovena Neighbours, MD 01/10/2017, 7:11 AM PGY-1, [facility] Family Medicine FPTS Intern pager: [phone], text pages welcome

## 2017-01-10 NOTE — Progress Notes (Signed)
Patient ID: [name], female   DOB: 1926/12/28, 83 y.o.   MRN: [id number]  This NP visited patient at the bedside as a follow up for palliative needs.  Spoke with  niece/HPOA/[name] Marvis MoellerMiles by telephone to continued discussion regarding diagnosis, prognosis, GOCs, disposition and options.  Although the patient is patient is more alert and less confused at this time she  remains high risk for decompensation 2/2 to multiple co-morbidites; requiring significant O2 support to maintain acceptable saturation levels.   We discussed natural trajectory of adult failure to thrive within the context of mortality.  Family understand " that she is getting close to the end" however for now  they remain hopeful for improvement.    They are open to SNF for rehabilitation when stable and if eligible.   Discussed with family the importance of continued conversation with family and their  medical providers regarding overall plan of care and treatment options,  ensuring decisions are within the context of the patients values and GOCs.     Questions and concerns addressed  Time in  1000        Time out  1035 Total time spent on the unit was 35 minutes  Greater than 50% of the time was spent in counseling and coordination of care  Lorinda CreedMary Khristian Phillippi NP  Palliative Medicine Team Team Phone # [phone] Pager [phone]

## 2017-01-10 NOTE — Progress Notes (Signed)
[facility] Progress Note   Subjective: no new c/o', occ cough  Vitals:   01/10/17 0011 01/10/17 0349 01/10/17 0356 01/10/17 0832  BP: (!) 111/49  (!) 110/48   Pulse: (!) 106 (!) 107 (!) 105   Resp: (!) 27 (!) 33 (!) 23   Temp: 97.7 F (36.5 C)  98.2 F (36.8 C) 97.6 F (36.4 C)  TempSrc: Axillary  Axillary Axillary  SpO2: 99% (!) 86% 94%   Weight:      Height:        Inpatient medications: . amLODipine  10 mg Oral QPM  . aspirin  81 mg Oral q morning - 10a  . atorvastatin  80 mg Oral QHS  . darbepoetin (ARANESP) injection - DIALYSIS  100 mcg Intravenous Q Tue-HD  . doxercalciferol  2 mcg Intravenous Q T,Th,Sa-HD  . feeding supplement (PRO-STAT SUGAR FREE 64)  30 mL Oral BID  . ferric citrate  420 mg Oral TID WC  . insulin aspart  0-9 Units Subcutaneous TID WC  . loratadine  10 mg Oral Daily  . mouth rinse  15 mL Mouth Rinse BID  . metoprolol tartrate  25 mg Oral BID  . multivitamin  1 tablet Oral QHS  . nystatin  5 mL Oral QID  . pantoprazole  40 mg Oral Daily  . sodium chloride flush  3 mL Intravenous Q12H   . piperacillin-tazobactam (ZOSYN)  IV Stopped (01/10/17 1610)  . vancomycin Stopped (01/09/17 1846)   acetaminophen **OR** acetaminophen, albuterol, docusate sodium, polyethylene glycol  Exam: Awake, alert, responsive, weak Chest bronchial BS 2/3 up on R, no change; slight basilar crackles on L RRR no mrg Abd soft no ascites Ext no edema Nonfocal, Ox 2  Dialysis: TTS at [facility] 3:45hrs, BFR 400, DFR A1.5, 2K/2.25Ca, Profile 4, EDW 67.5kg, AVG - Heparin 2000 unit bolus q HD - Hectoral IV q HD - Mircera q 2 weeks (just reordered, last given 11/24/16)      Assessment: 1. Hypoxemia/ large RLL and RML PNA/ exudative R pleural effusion - on IV abx, not surg candidate. Clinically somewhat better.  No improvement on exam, will get repeat CXR.  2. Volume - no vol excess on exam /CXR, up 1-2kg 3. ESRD HD TTS 4. Dementia - mild, was  living alone PTA 5. DM on po meds at home, SSI here 6. Anemia of CKD - starting ESA here 7. DNR  Plan - HD TTS, abx, have d/w primary team   Vinson Moselle MD [facility] pager [phone]   01/10/2017, 10:47 AM    Recent Labs Lab 01/05/17 0452 01/06/17 0443 01/07/17 0515 01/08/17 1340 01/09/17 0308  NA 132* 135 138 133* 135  K 3.5 3.7 3.7 4.0 3.4*  CL 94* 96* 95* 95* 95*  CO2 23 25 23 25 25   GLUCOSE 394* 216* 185* 360* 300*  BUN 51* 21* 33* 23* 31*  CREATININE 8.00* 4.39* 6.60* 4.66* 5.60*  CALCIUM 9.3 8.8* 9.2 9.1 9.4  PHOS 4.2 2.9 3.7  --   --     Recent Labs Lab 01/05/17 0452 01/05/17 1653 01/06/17 0443 01/07/17 0515  PROT  --  8.1  --   --   ALBUMIN 2.3*  --  2.1* 2.1*    Recent Labs Lab 01/07/17 0515 01/08/17 1340 01/09/17 0308  WBC 16.3* 15.2* 16.8*  HGB 8.7* 8.5* 8.6*  HCT 28.2* 27.4* 27.1*  MCV 99.3 99.3 99.3  PLT 250 273 274   Iron/TIBC/Ferritin/ %Sat  Component Value Date/Time   IRON 84 12/14/2014 1443   TIBC 216 (L) 12/14/2014 1443   FERRITIN 1,385 (H) 12/14/2014 1443   IRONPCTSAT 39 12/14/2014 1443

## 2017-01-10 NOTE — Progress Notes (Signed)
Pt tried to get out of bed, assisted to sit in recliner. Chair alarm on, call Henneman within reach.

## 2017-01-10 NOTE — Progress Notes (Signed)
O2 sats 86% on 5L. Pt sitting upright in bed, WOB noted with diminished lung sound. Pt placed on NRB, WOB improved.

## 2017-01-11 DIAGNOSIS — E1122 Type 2 diabetes mellitus with diabetic chronic kidney disease: Secondary | ICD-10-CM

## 2017-01-11 LAB — BASIC METABOLIC PANEL
ANION GAP: 12 (ref 5–15)
BUN: 31 mg/dL — ABNORMAL HIGH (ref 6–20)
CHLORIDE: 93 mmol/L — AB (ref 101–111)
CO2: 27 mmol/L (ref 22–32)
Calcium: 9.2 mg/dL (ref 8.9–10.3)
Creatinine, Ser: 5.07 mg/dL — ABNORMAL HIGH (ref 0.44–1.00)
GFR calc non Af Amer: 7 mL/min — ABNORMAL LOW (ref >60)
GFR, EST AFRICAN AMERICAN: 8 mL/min — AB (ref >60)
Glucose, Bld: 245 mg/dL — ABNORMAL HIGH (ref 65–99)
POTASSIUM: 3.7 mmol/L (ref 3.5–5.1)
Sodium: 132 mmol/L — ABNORMAL LOW (ref 135–145)

## 2017-01-11 LAB — CBC
HEMATOCRIT: 27.1 % — AB (ref 36.0–46.0)
HEMOGLOBIN: 8.5 g/dL — AB (ref 12.0–15.0)
MCH: 31.3 pg (ref 26.0–34.0)
MCHC: 31.4 g/dL (ref 30.0–36.0)
MCV: 99.6 fL (ref 78.0–100.0)
Platelets: 295 10*3/uL (ref 150–400)
RBC: 2.72 MIL/uL — AB (ref 3.87–5.11)
RDW: 17.7 % — ABNORMAL HIGH (ref 11.5–15.5)
WBC: 13 10*3/uL — AB (ref 4.0–10.5)

## 2017-01-11 LAB — GLUCOSE, CAPILLARY
GLUCOSE-CAPILLARY: 292 mg/dL — AB (ref 65–99)
GLUCOSE-CAPILLARY: 328 mg/dL — AB (ref 65–99)
GLUCOSE-CAPILLARY: 407 mg/dL — AB (ref 65–99)
GLUCOSE-CAPILLARY: 301 mg/dL — AB (ref 65–99)
Glucose-Capillary: 429 mg/dL — ABNORMAL HIGH (ref 65–99)

## 2017-01-11 MED ORDER — ALUM & MAG HYDROXIDE-SIMETH 200-200-20 MG/5ML PO SUSP
30.0000 mL | Freq: Four times a day (QID) | ORAL | Status: DC | PRN
  Administered 2017-01-11: 30 mL via ORAL
  Filled 2017-01-11 (×2): qty 30

## 2017-01-11 MED ORDER — INSULIN ASPART 100 UNIT/ML ~~LOC~~ SOLN
5.0000 [IU] | Freq: Once | SUBCUTANEOUS | Status: AC
  Administered 2017-01-11: 5 [IU] via SUBCUTANEOUS

## 2017-01-11 NOTE — Progress Notes (Addendum)
[facility] Progress Note   Subjective: no new c/o Plans for transfer to [facility] 5/22 Some SOB/O2 desats - sitting on edge of bed now - acknowledges currently SOB but anxious to get out of the hospital tomorrow  Vitals:   01/11/17 0050 01/11/17 0547 01/11/17 0755 01/11/17 1135  BP: (!) 150/63 (!) 148/53 (!) 132/98 138/65  Pulse: 96 98 96 92  Resp: 20 (!) 25 (!) 24 18  Temp: 98.2 F (36.8 C) 97.6 F (36.4 C) 97.8 F (36.6 C) 97.6 F (36.4 C)  TempSrc: Oral Oral Oral Oral  SpO2: 100% 100% 100% 100%  Weight:  68.5 kg (151 lb 1.6 oz)    Height:       Exam: Awake, alert, responsive, weak Chest bronchial BS 2/3 up on R, no change; slight basilar crackles on L RRR no mrg Abd soft no ascites Ext no edema Nonfocal, Ox 2   Inpatient medications: . amLODipine  10 mg Oral QPM  . aspirin  81 mg Oral q morning - 10a  . atorvastatin  80 mg Oral QHS  . darbepoetin (ARANESP) injection - DIALYSIS  100 mcg Intravenous Q Tue-HD  . doxercalciferol  2 mcg Intravenous Q T,Th,Sa-HD  . feeding supplement (PRO-STAT SUGAR FREE 64)  30 mL Oral BID  . ferric citrate  420 mg Oral TID WC  . insulin aspart  0-9 Units Subcutaneous TID WC  . [START ON 01/12/2017] levofloxacin  500 mg Oral Q48H  . loratadine  10 mg Oral Daily  . mouth rinse  15 mL Mouth Rinse BID  . metoprolol tartrate  25 mg Oral BID  . multivitamin  1 tablet Oral QHS  . nystatin  5 mL Oral QID  . pantoprazole  40 mg Oral Daily  . sodium chloride flush  3 mL Intravenous Q12H    acetaminophen **OR** acetaminophen, albuterol, docusate sodium, polyethylene glycol Dg Chest 2 View  Result Date: 01/10/2017 CLINICAL DATA:  Follow-up pneumonia EXAM: CHEST  2 VIEW COMPARISON:  01/06/2017 FINDINGS: Cardiomegaly. Large right pleural effusion and small left pleural effusion. Diffuse right lung airspace disease. Aeration arthritis worsened since prior study. Patchy left lower lung lobe airspace disease, stable. IMPRESSION:  Large right pleural effusion and small left effusion. This may have increased slightly on the right since prior study. Diffuse right lung airspace disease and left lower lobe opacity. Electronically Signed   By: Charlett Nose M.D.   On: 01/10/2017 11:33    Recent Labs Lab 01/05/17 0452 01/06/17 0443 01/07/17 0515 01/08/17 1340 01/09/17 0308 01/11/17 0420  NA 132* 135 138 133* 135 132*  K 3.5 3.7 3.7 4.0 3.4* 3.7  CL 94* 96* 95* 95* 95* 93*  CO2 23 25 23 25 25 27   GLUCOSE 394* 216* 185* 360* 300* 245*  BUN 51* 21* 33* 23* 31* 31*  CREATININE 8.00* 4.39* 6.60* 4.66* 5.60* 5.07*  CALCIUM 9.3 8.8* 9.2 9.1 9.4 9.2  PHOS 4.2 2.9 3.7  --   --   --     Recent Labs Lab 01/05/17 0452 01/05/17 1653 01/06/17 0443 01/07/17 0515  PROT  --  8.1  --   --   ALBUMIN 2.3*  --  2.1* 2.1*    Recent Labs Lab 01/08/17 1340 01/09/17 0308 01/11/17 0420  WBC 15.2* 16.8* 13.0*  HGB 8.5* 8.6* 8.5*  HCT 27.4* 27.1* 27.1*  MCV 99.3 99.3 99.6  PLT 273 274 295   Iron/TIBC/Ferritin/ %Sat    Component Value Date/Time  IRON 84 12/14/2014 1443   TIBC 216 (L) 12/14/2014 1443   FERRITIN 1,385 (H) 12/14/2014 1443   IRONPCTSAT 39 12/14/2014 1443   [facility],  BFR 400, DFR A1.5,  2K/2.25Ca,  Profile 4,  EDW 67.5kg, lower at discharge AVG - Heparin 2000 unit bolus q HD - Hectoral 2mcg IV q HD - Mircera 150mcg q 2 weeks (just reordered, last given 11/24/16)  Assessment: 1. Hypoxemia/ large RLL and RML PNA/ exudative R pleural effusion - s/p IV abx ->levaquin, not surg candidate. No improvement on exam, and CXR w/large R effusion 2. ESRD HD TTS. EDW 67.5. Lower to at least 66.5. (Pulm wise will be better "drier" than wet 3. Dementia - mild, was living alone PTA 4. DM on po meds at home, SSI here 5. Anemia of CKD - starting ESA here (darbe 100 QTuesday) 6. DNR 7. Disposition - SNF (tomorrow after HD)  Camille Balynthia Ules Marsala, MD [facility] [phone]  Pager 01/11/2017, 2:17 PM

## 2017-01-11 NOTE — Care Management Note (Addendum)
Case Management Note  Patient Details  Name: Clent Ridgesddie R Belshe MRN: [id number] Date of Birth: 05/27/1927  Subjective/Objective: Pt presented for SOB- 2/2 HCAP and AMS. Pt initiated on IV antibiotic therapy. ESRD on HD-T, TH, Sat. Plan for d/c to SNF.                     Action/Plan: CSW assisting with disposition needs. [facility]. No further needs from CM at this time.    Expected Discharge Date:  01/06/17               Expected Discharge Plan:  Skilled Nursing Facility  In-House Referral:  Clinical Social Work  Discharge planning Services  CM Consult  Post Acute Care Choice:  NA Choice offered to:  NA  DME Arranged:  N/A DME Agency:  NA  HH Arranged:  NA HH Agency:  NA  Status of Service:  Completed, signed off  If discussed at [employer] Length of Stay Meetings, dates discussed:  01-12-17, 01-14-17  Additional Comments: 1659 01-15-17 Tomi BambergerBrenda Graves-Bigelow, RN,BSN [phone] Pt now comfort care. CSW spoke with family in regards to Residential Facility. [facility] No further needs from CM at this time.    1438 01-14-17 Tomi BambergerBrenda Graves-Bigelow, RN, BSN [phone] [facility] Recommendations made from Physician Advisor: to see if patient can tolerate HD in recliner chair. Pt was in route to HD before this could happen today. Pt will need an order per HD to dialyze in the chair. We will need to see if pt will be able to tolerate outpatient HD as well from Renal Standpoint. Pt has been requiring NRB at times- Morphine ordered prn anxiety and air hunger-Now on 5 Liters. Family may benefit from a continued conversation to discuss goals of care/ comfort care. Plan for SNF once stable. CM will continue to monitor.    1021 01-13-17 Tomi BambergerBrenda Graves-Bigelow, RN,BSN [phone] Pt on po Levaquin- nonrebreather 2/2 pulmonary status. Pt is a DNR- Plan will be for SNF with Palliative to follow vs Hospice. Family has hopes for patient to  improve. Pt/ Family may benefit from a continued conversation to discuss goals of care/ comfort care. CM will continue to monitor for additional needs.   Gala LewandowskyGraves-Bigelow, Dalal Livengood Kaye, RN 01/11/2017, 2:50 PM

## 2017-01-11 NOTE — Progress Notes (Signed)
Inpatient Diabetes Program Recommendations  AACE/ADA: New Consensus Statement on Inpatient Glycemic Control (2015)  Target Ranges:  Prepandial:   less than 140 mg/dL      Peak postprandial:   less than 180 mg/dL (1-2 hours)      Critically ill patients:  140 - 180 mg/dL  Results for [name], Claudia Rich (MRN [id number]) as of 01/11/2017 10:24  Ref. Range 01/10/2017 07:36 01/10/2017 11:28 01/10/2017 16:31 01/10/2017 21:43 01/11/2017 07:51  Glucose-Capillary Latest Ref Range: 65 - 99 mg/dL 409339 (H) 811293 (H) 914312 (H) 212 (H) 292 (H)   Results for [name], Claudia Rich (MRN [id number]) as of 01/11/2017 10:24  Ref. Range 11/23/2016 11:33  Hemoglobin A1C Unknown 7.9   Review of Glycemic Control  Diabetes history: DM2 Outpatient Diabetes medications: Januvia 25 mg daily Current orders for Inpatient glycemic control: Novolog 0-9 units TID with meals  Inpatient Diabetes Program Recommendations: Insulin - Basal: Glucose ranged from 212-339 mg/dl on 0/86/575/20/18 and fasting glucose 292 mg/dl today. Please order Levemir 7 units Q24H starting now (based on 68 kg x 0.1 units). Correction (SSI): Please consider ordering Novolog 0-5 units QHS for bedtime correction scale. HgbA1C: A1C 7.9% on 11/23/16 indicating an average glucose of 180 mg/dl.  Thanks, Orlando PennerMarie Savion Washam, RN, MSN, CDE Diabetes Coordinator Inpatient Diabetes Program [phone] (Team Pager from 8am to 5pm)

## 2017-01-11 NOTE — Plan of Care (Signed)
Problem: Activity: Goal: Risk for activity intolerance will decrease Outcome: Progressing PT/OT consult, pt able to sit up in chair part of the day.

## 2017-01-11 NOTE — Progress Notes (Addendum)
1:30pm Authorization received- csw spoke with MD who plans for DC tomorrow- [facility] updated and able to accept tomorrow  9:45pm CSW continuing to follow for [facility]- [employer] authorization submitted- awaiting auth number  Burna SisJenna H. Madisan Bice, LCSW Clinical Social Worker [phone]

## 2017-01-11 NOTE — Progress Notes (Signed)
Initial Nutrition Assessment  DOCUMENTATION CODES:   Not applicable  INTERVENTION:    Continue Pro-stat 30 ml BID, each supplement provides 100 kcal and 15 gm protein.  Send Magic cup TID with meals, each supplement provides 290 kcal and 9 grams of protein  NUTRITION DIAGNOSIS:   Increased nutrient needs related to wound healing as evidenced by estimated needs.  GOAL:   Patient will meet greater than or equal to 90% of their needs  MONITOR:   PO intake, Supplement acceptance, Skin  REASON FOR ASSESSMENT:   Malnutrition Screening Tool    ASSESSMENT:   83 y.o. female presenting with SOB, AMS. PMH is significant for ESRD on HD, dementia, CAD, T2DM, asthma.  Patient somewhat confused during RD visit. She asked to get in her chair, but she was already up in the chair. She does not like Ensure, but she took the Pro-stat supplement well today per RN. Nutrition-Focused physical exam completed. Findings are no fat depletion, mild-moderate muscle depletion, and no edema.   Patient with minimal 3% weight loss over the past year. Labs and medications reviewed. CBG's: [phone] Potassium 3.7 WNL, phosphorus 3.7 WNL  Diet Order:  DIET DYS 2 Room service appropriate? Yes; Fluid consistency: Thin  Skin:  Wound (see comment) (stage II to buttocks)  Last BM:  5/18  Height:   Ht Readings from Last 1 Encounters:  01/04/17 5\' 4"  (1.626 m)    Weight:   Wt Readings from Last 1 Encounters:  01/11/17 151 lb 1.6 oz (68.5 kg)    Ideal Body Weight:  54.5 kg  BMI:  Body mass index is 25.94 kg/m.  Estimated Nutritional Needs:   Kcal:  1500-1700  Protein:  80-100 gm  Fluid:  1.7 L  EDUCATION NEEDS:   No education needs identified at this time  Joaquin CourtsKimberly Harris, RD, LDN, CNSC Pager [phone] After Hours Pager [phone]

## 2017-01-11 NOTE — Progress Notes (Signed)
Family Medicine Teaching Service Daily Progress Note Intern Pager: [phone]  Patient name: Claudia Rich Medical record number: [id number] Date of birth: 02-06-1927 Age: 83 y.o. Gender: female  Primary Care Provider: Araceli Bouche, DO Consultants: Renal Code Status: Full  Pt Overview and Major Events to Date:  5/13 admit to FPTS  Assessment and Plan: Claudia Rich is a 83 y.o. female presenting with SOB, AMS . PMH is significant for ESRD on HD, dementia, CAD, T2DM, asthma.  #SOB with Hypoxia secondary to Pneumonia Patient is day #6 s/p thoracentesis by pulmonology with 300 ml cloudy pleural fluid obtained in the setting of a RML pneumonia. Loculated pocket of fluid unable to be removed. Transition to Levaquin on 05/20. Overnight, patient was on non rebreather for desaturation into the 80's. Blood and pleural fluid cultures show no growth. Patient is currently on 4L [place] and satting at a 100% improving slowly. Patient has not required a breathing treatment two days. CXR from 5/20 showed increase pleural effusion on the right from previous imaging (5/16) and small effusion on the left. --Continue telemetry --Continue Levaquin 750 mg loading dose (5/20) DAY 1 and 500 mg q48h Start 5/22 --Discontinue Vancomycin 750 mg IV ( start 5/13-5/20) --Discontinue Zosyn3.375 g bid (start 5/15-5/20 ) --O2 prn saturations <92% --Will continue weaning oxygen   #AMS, improving Patient's mental status have been improving in the past few days likely combination of baseline dementia and delirium. She is much improved and almost back to baseline. --Will continue to monitor --Reorient as needed --Delirium precautions, family at bedside, open blinds  #HTN  BP this am is 148/53.  Patient is currently on metoprolol 25 mg and norvasc 10 mg. Tachycardia has improved. Increased  --Continue amlodipine 10 mg daily --Continue metoprolol 25 mg bid   #ESRD on HD Patient is on Tu,Th, Sat schedule.  Patient is  tolerating  well, no complications. Good volume out, patient also receiving Darbepoetin alfa and Hemoglobin has been stable.  -- Nephrology following, appreciate recs  #DM2 11/2016 A1c 7.9. Patient's po intake continue to improve as seen with increase in short acting insulin use. --sSSI while hospitalized --BG checks qAC/hs  #Pressure wound Patient has stage II pressure ulcer wound on the right buttock. Appears dry and is being assess regularly by nursing. Patient endorsed some discomfort yesterday and requested pain meds after tylenol did not provide any relief. --Would consult wound care as needed --Will follow up on pain   #GERD w/ h/o peptic ulcer --Continue pantoprazole 40 mg daily    #Oral Thrush --Continue Nystatin, will reassess  FEN/GI: Dysphagia 2  Fine chop, meds whole w/ pudding, HH/Carb mod renal diet, Protonix Prophylaxis: Lovenox subq renal dosed    Disposition: Anticipate discharge to [facility] upon insurance approval and medical clearance  Subjective:  Patient's mental status is much improved this morning. She is able to carry a conversation and is coherent. Patient endorses feeling much better, appetite is better.   Objective: Temp:  [97.6 F (36.4 C)-98.8 F (37.1 C)] 97.6 F (36.4 C) (05/21 0547) Pulse Rate:  [91-102] 98 (05/21 0547) Resp:  [20-26] 25 (05/21 0547) BP: (120-150)/(53-66) 148/53 (05/21 0547) SpO2:  [93 %-100 %] 100 % (05/21 0547) Weight:  [151 lb 1.6 oz (68.5 kg)] 151 lb 1.6 oz (68.5 kg) (05/21 0547)   Physical Exam:  General: patient appears uncomfortable, breathing comfortably Cardiovascular: RRR, no m/r/g Respiratory: comfortable work of breathing, poor air movement throughout right lung fields, moving air clearly upper and lower  lobes on the left. Abdomen: soft and nontender, nondistended Extremities: no LE edema Psych: AAOx0/4  Laboratory:  Recent Labs Lab 01/08/17 1340 01/09/17 0308 01/11/17 0420  WBC 15.2* 16.8* 13.0*   HGB 8.5* 8.6* 8.5*  HCT 27.4* 27.1* 27.1*  PLT 273 274 295    Recent Labs Lab 01/05/17 1653  01/08/17 1340 01/09/17 0308 01/11/17 0420  NA  --   < > 133* 135 132*  K  --   < > 4.0 3.4* 3.7  CL  --   < > 95* 95* 93*  CO2  --   < > 25 25 27   BUN  --   < > 23* 31* 31*  CREATININE  --   < > 4.66* 5.60* 5.07*  CALCIUM  --   < > 9.1 9.4 9.2  PROT 8.1  --   --   --   --   GLUCOSE  --   < > 360* 300* 245*  < > = values in this interval not displayed.   Imaging/Diagnostic Tests: Dg Chest 2 View: 01/03/2017 FINDINGS: Moderate right pleural effusion. Associated right lower lobe opacity, likely compressive atelectasis. No frank interstitial edema.  No pneumothorax. The heart is normal in size. Vascular stent overlying the mediastinum. Old left rib fracture deformities.  IMPRESSION: Moderate right pleural effusion. Associated right lower lobe opacity, likely compressive atelectasis.   Ct Angio Chest Pe W Or Wo Contrast  01/04/2017 IMPRESSION: Large right pleural effusion layering dependently with subtotal collapse of the right lung. Patchy pneumonia left lower lobe.  Left lower lobe bronchiectasis. 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 Aortic and coronary artery calcification. Multiple venous collaterals suggest stenosis of the the SVC at the SVC innominate junction. Innominate stent in place. Uncertain if this is patent.    Lovena Neighboursiallo, Tianna Baus, MD 01/11/2017, 6:23 AM PGY-1, [facility] FPTS Intern pager: [phone], text pages welcome

## 2017-01-11 NOTE — Consult Note (Signed)
   [facility]   01/11/2017  Claudia Rich 1927-01-13 [id number]    [facility]. Chart reviewed. [facility]. Confirmed discharge plans with inpatient RNCM. No identifiable [facility] needs at this time.   Raiford NobleAtika Bensen [place], MSN-Ed, RN,BSN [facility] Care Management Hospital Liaison [phone]

## 2017-01-11 NOTE — Progress Notes (Signed)
Physical Therapy Treatment Patient Details Name: Claudia Rich MRN: [id number] DOB: Jun 20, 1927 Today's Date: 01/11/2017    History of Present Illness 83 yo female admitted with AMS  LLL infiltrate and L R pleural effusion with RML/ RLL collapse. PMH: ESRD TTHSAT COPD, PVD, dementia, CAD. DM. asthma    PT Comments    Pt very anxious today. Stating she feels SOB but sats at 99-100% on 3 L O2. Pt agreeable to sitting up in recliner.She appears more comfortable and relaxed in recliner. Pt with c/o buttocks pain due to pressure sore. Pt declined additional sit to stand to place foam pad in chair. RN notified.   Follow Up Recommendations  SNF     Equipment Recommendations  Rolling walker with 5" wheels;3in1 (PT)    Recommendations for Other Services       Precautions / Restrictions Precautions Precautions: Fall;Other (comment) Precaution Comments: Watch O2 sats    Mobility  Bed Mobility         Supine to sit: Min assist;HOB elevated     General bed mobility comments: +rail, increased time, continuous verbal cues for sequencing  Transfers   Equipment used: Rolling walker (2 wheeled)   Sit to Stand: Min assist         General transfer comment: verbal cues for hand placement and sequencing  Ambulation/Gait Ambulation/Gait assistance: Min assist Ambulation Distance (Feet): 3 Feet Assistive device: Rolling walker (2 wheeled) Gait Pattern/deviations: Decreased stride length;Step-through pattern Gait velocity: decreased   General Gait Details: unsteady, shaky gait. Pt with c/o SOB. SpO2 at 99-100% with pt on 3 L O2 throughout Rx. HR 103   Stairs            Wheelchair Mobility    Modified Rankin (Stroke Patients Only) Modified Rankin (Stroke Patients Only) Pre-Morbid Rankin Score: No significant disability Modified Rankin: Moderately severe disability     Balance   Sitting-balance support: Feet supported;No upper extremity supported Sitting balance-Leahy  Scale: Good     Standing balance support: Bilateral upper extremity supported;During functional activity Standing balance-Leahy Scale: Poor                              Cognition Arousal/Alertness: Awake/alert Behavior During Therapy: Anxious;Restless Overall Cognitive Status: Impaired/Different from baseline Area of Impairment: Orientation;Following commands;Safety/judgement;Problem solving                 Orientation Level: Disoriented to;Time;Situation     Following Commands: Follows one step commands with increased time Safety/Judgement: Decreased awareness of safety   Problem Solving: Slow processing;Difficulty sequencing;Requires verbal cues General Comments: Pt alert and conversive. Very anxious today.      Exercises      General Comments        Pertinent Vitals/Pain Pain Assessment: Faces Faces Pain Scale: Hurts whole lot Pain Location: buttocks due to pressure wound Pain Descriptors / Indicators: Grimacing;Moaning;Discomfort Pain Intervention(s): Monitored during session;Repositioned;Patient requesting pain meds-RN notified    Home Living                      Prior Function            PT Goals (current goals can now be found in the care plan section) Acute Rehab PT Goals Patient Stated Goal: not stated PT Goal Formulation: Patient unable to participate in goal setting Time For Goal Achievement: 01/18/17 Potential to Achieve Goals: Fair Progress towards PT goals: Progressing toward goals  Frequency    Min 3X/week      PT Plan Current plan remains appropriate    Co-evaluation              AM-PAC PT "6 Clicks" Daily Activity  Outcome Measure  Difficulty turning over in bed (including adjusting bedclothes, sheets and blankets)?: None Difficulty moving from lying on back to sitting on the side of the bed? : Total Difficulty sitting down on and standing up from a chair with arms (e.g., wheelchair, bedside  commode, etc,.)?: Total Help needed moving to and from a bed to chair (including a wheelchair)?: A Little Help needed walking in hospital room?: A Little Help needed climbing 3-5 steps with a railing? : A Lot 6 Click Score: 14    End of Session Equipment Utilized During Treatment: Gait belt;Oxygen (3 L O2) Activity Tolerance: Other (comment);Patient limited by pain (anxiety) Patient left: in chair;with chair alarm set;with call Bedwell/phone within reach Nurse Communication: Mobility status PT Visit Diagnosis: Unsteadiness on feet (R26.81);Muscle weakness (generalized) (M62.81);Other (comment)     Time: 1610-9604 PT Time Calculation (min) (ACUTE ONLY): 26 min  Charges:  $Therapeutic Activity: 23-37 mins                    G Codes:       Aida Raider, PT  Office # [phone] Pager [phone]    Ilda Foil 01/11/2017, 9:15 AM

## 2017-01-12 ENCOUNTER — Inpatient Hospital Stay (HOSPITAL_COMMUNITY): Payer: Medicare HMO

## 2017-01-12 ENCOUNTER — Inpatient Hospital Stay: Payer: Medicare HMO | Admitting: Family Medicine

## 2017-01-12 DIAGNOSIS — I10 Essential (primary) hypertension: Secondary | ICD-10-CM

## 2017-01-12 LAB — BASIC METABOLIC PANEL
Anion gap: 14 (ref 5–15)
BUN: 45 mg/dL — AB (ref 6–20)
CHLORIDE: 91 mmol/L — AB (ref 101–111)
CO2: 26 mmol/L (ref 22–32)
CREATININE: 6.73 mg/dL — AB (ref 0.44–1.00)
Calcium: 9.3 mg/dL (ref 8.9–10.3)
GFR calc Af Amer: 6 mL/min — ABNORMAL LOW (ref >60)
GFR calc non Af Amer: 5 mL/min — ABNORMAL LOW (ref >60)
Glucose, Bld: 219 mg/dL — ABNORMAL HIGH (ref 65–99)
POTASSIUM: 3.9 mmol/L (ref 3.5–5.1)
Sodium: 131 mmol/L — ABNORMAL LOW (ref 135–145)

## 2017-01-12 LAB — CBC
HEMATOCRIT: 25.5 % — AB (ref 36.0–46.0)
HEMOGLOBIN: 8 g/dL — AB (ref 12.0–15.0)
MCH: 31.3 pg (ref 26.0–34.0)
MCHC: 31.4 g/dL (ref 30.0–36.0)
MCV: 99.6 fL (ref 78.0–100.0)
Platelets: 323 10*3/uL (ref 150–400)
RBC: 2.56 MIL/uL — ABNORMAL LOW (ref 3.87–5.11)
RDW: 17.6 % — ABNORMAL HIGH (ref 11.5–15.5)
WBC: 14.5 10*3/uL — ABNORMAL HIGH (ref 4.0–10.5)

## 2017-01-12 LAB — GLUCOSE, CAPILLARY
GLUCOSE-CAPILLARY: 222 mg/dL — AB (ref 65–99)
GLUCOSE-CAPILLARY: 279 mg/dL — AB (ref 65–99)
Glucose-Capillary: 109 mg/dL — ABNORMAL HIGH (ref 65–99)
Glucose-Capillary: 243 mg/dL — ABNORMAL HIGH (ref 65–99)

## 2017-01-12 MED ORDER — DOXERCALCIFEROL 4 MCG/2ML IV SOLN
INTRAVENOUS | Status: AC
  Filled 2017-01-12: qty 2

## 2017-01-12 MED ORDER — DARBEPOETIN ALFA 100 MCG/0.5ML IJ SOSY
PREFILLED_SYRINGE | INTRAMUSCULAR | Status: AC
  Administered 2017-01-12: 100 ug via INTRAVENOUS
  Filled 2017-01-12: qty 0.5

## 2017-01-12 NOTE — Progress Notes (Signed)
Family Medicine Teaching Service Daily Progress Note Intern Pager: [phone]  Patient name: Claudia Ridgesddie R Roher Medical record number: [id number] Date of birth: September 05, 1926 Age: 83 y.o. Gender: female  Primary Care Provider: Araceli Boucheumley, Thayer N, DO Consultants: Renal Code Status: Full  Pt Overview and Major Events to Date:  5/13 admit to FPTS  Assessment and Plan: Claudia Rich is a 83 y.o. female presenting with SOB, AMS . PMH is significant for ESRD on HD, dementia, CAD, T2DM, asthma.  #SOB with Hypoxia secondary to Pneumonia Patient is day #7 s/p thoracentesis by pulmonology with 300 ml cloudy pleural fluid obtained in the setting of a RML pneumonia. Loculated pocket of fluid unable to be removed. Overnight, patient was on non rebreather for desaturation into the 80's.  Patient is currently on 3L [place] and satting at a 100%.  --Continue telemetry --Continue Levaquin 750 mg loading dose (5/20) DAY 1 and 500 mg q48h Start 5/22 --O2 prn saturations <92% --Will continue weaning oxygen   #AMS, improving Patient's mental status have been improving in the past few days likely combination of baseline dementia and delirium. She is much improved and almost back to baseline. --Will continue to monitor --Reorient as needed --Delirium precautions, family at bedside, open blinds  #HTN  BP this am is 153/86.  Patient is currently on metoprolol 25 mg and norvasc 10 mg. Tachycardia has improved. Blood pressure within normal range most of the day with slight increase in the evening. --Continue amlodipine 10 mg daily --Continue metoprolol 25 mg bid   #ESRD on HD Patient is on Tu,Th, Sat schedule. Patient is tolerating well, no complications. Good volume out, patient also receiving Darbepoetin alfa and Hemoglobin has been stable.  -- Nephrology following, appreciate recs  #DM2 11/2016 A1c 7.9. Patient's po intake continue to improve as seen with increase in short acting insulin use. --sSSI while  hospitalized --BG checks qAC/hs  #Pressure wound Patient has stage II pressure ulcer wound on the right buttock. Appears dry and is being assess regularly by nursing. Patient endorsed some discomfort yesterday and requested pain meds after tylenol did not provide any relief. --Would consult wound care as needed --Will follow up on pain   #GERD w/ h/o peptic ulcer --Continue pantoprazole 40 mg daily    #Oral Thrush --Continue Nystatin, will reassess  FEN/GI: Dysphagia 2 Fine chop, meds whole w/ pudding, HH/Carb mod renal diet, Protonix Prophylaxis: Lovenox subq renal dosed    Dis[facility]: [facility] session  Subjective:  Patient was sitting up in her chair this morning eating breakfast. Patient reports that she feels better. I told her [facility] and patient in agreement with plan.  Objective: Temp:  [97.5 F (36.4 C)-97.8 F (36.6 C)] 97.8 F (36.6 C) (05/22 0429) Pulse Rate:  [92-111] 97 (05/22 0239) Resp:  [17-24] 20 (05/22 0239) BP: (63-153)/(37-98) 153/86 (05/22 0429) SpO2:  [57 %-100 %] 92 % (05/22 0239) Weight:  [160 lb 7.9 oz (72.8 kg)] 160 lb 7.9 oz (72.8 kg) (05/22 0429)   Physical Exam:  General: patient appears uncomfortable, breathing comfortably Cardiovascular: RRR, no m/r/g Respiratory: comfortable work of breathing, poor air movement throughout right lung fields, moving air clearly upper and lower lobes on the left. Abdomen: soft and nontender, nondistended Extremities: no LE edema Neuro: AOx3, no focal deficits  Laboratory:  Recent Labs Lab 01/09/17 0308 01/11/17 0420 01/12/17 0432  WBC 16.8* 13.0* 14.5*  HGB 8.6* 8.5* 8.0*  HCT  27.1* 27.1* 25.5*  PLT 274 295 323    Recent Labs Lab 01/05/17 1653  01/09/17 0308 01/11/17 0420 01/12/17 0432  NA  --   < > 135 132* 131*  K  --   < > 3.4* 3.7 3.9  CL  --   < > 95* 93* 91*  CO2  --   < > 25 27 26   BUN  --   < > 31*  31* 45*  CREATININE  --   < > 5.60* 5.07* 6.73*  CALCIUM  --   < > 9.4 9.2 9.3  PROT 8.1  --   --   --   --   GLUCOSE  --   < > 300* 245* 219*  < > = values in this interval not displayed.   Imaging/Diagnostic Tests: Dg Chest 2 View: 01/03/2017 FINDINGS: Moderate right pleural effusion. Associated right lower lobe opacity, likely compressive atelectasis. No frank interstitial edema.  No pneumothorax. The heart is normal in size. Vascular stent overlying the mediastinum. Old left rib fracture deformities.  IMPRESSION: Moderate right pleural effusion. Associated right lower lobe opacity, likely compressive atelectasis.   Ct Angio Chest Pe W Or Wo Contrast  01/04/2017 IMPRESSION: Large right pleural effusion layering dependently with subtotal collapse of the right lung. Patchy pneumonia left lower lobe.  Left lower lobe bronchiectasis. 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 Aortic and coronary artery calcification. Multiple venous collaterals suggest stenosis of the the SVC at the SVC innominate junction. Innominate stent in place. Uncertain if this is patent.    Lovena Neighbours, MD 01/12/2017, 6:24 AM PGY-1, [facility] Family Medicine FPTS Intern pager: [phone], text pages welcome

## 2017-01-12 NOTE — Progress Notes (Signed)
PT Cancellation Note  Patient Details Name: Claudia Rich MRN: [id number] DOB: 24-Jan-1927   Cancelled Treatment:    Reason Eval/Treat Not Completed: Patient at procedure or test/unavailable .  Pt is in HD  SonoraRebecca B. Analese Sovine, PT, DPT [phone]   01/12/2017, 4:19 PM

## 2017-01-12 NOTE — Progress Notes (Addendum)
Inpatient Diabetes Program Recommendations  AACE/ADA: New Consensus Statement on Inpatient Glycemic Control (2015)  Target Ranges:  Prepandial:   less than 140 mg/dL      Peak postprandial:   less than 180 mg/dL (1-2 hours)      Critically ill patients:  140 - 180 mg/dL   Results for Claudia Rich, Claudia Rich (MRN [id number]) as of 01/12/2017 09:19  Ref. Range 01/11/2017 11:32 01/11/2017 16:47 01/11/2017 18:40 01/11/2017 20:43 01/12/2017 07:24  Glucose-Capillary Latest Ref Range: 65 - 99 mg/dL 308328 (H) 657407 (H) 846429 (H) 301 (H) 222 (H)   Review of Glycemic Control Diabetes history: DM2 Outpatient Diabetes medications: Januvia 25 mg daily Current orders for Inpatient glycemic control: Novolog 0-9 units TID with meals  Inpatient Diabetes Program Recommendations: Insulin - Basal: Glucose ranged from 212-339 mg/dl on 9/62/955/20/18, 284-132301-429 mg/dl on 4/40/105/21/18, and fasting glucose 222 mg/dl today. Please order Levemir 7 units Q24H starting now (based on 72 kg x 0.1 units). Correction (SSI): Please consider ordering Novolog 0-5 units QHS for bedtime correction scale. Insulin-Meal Coverage: Please consider ordering Novolog 3 units TID with meals for meal coverage if patient eats at least 50% of meals. HgbA1C: A1C 7.9% on 11/23/16 indicating an average glucose of 180 mg/dl.  Thanks, Claudia PennerMarie Syon Tews, RN, MSN, CDE Diabetes Coordinator Inpatient Diabetes Program [phone] (Team Pager from 8am to 5pm)

## 2017-01-12 NOTE — Progress Notes (Signed)
Occupational Therapy Treatment Patient Details Name: Claudia Rich MRN: [id number] DOB: [birth year] Today's Date: 01/12/2017    History of present illness 83 yo female admitted with AMS  LLL infiltrate and L R pleural effusion with RML/ RLL collapse. PMH: ESRD TTHSAT COPD, PVD, dementia, CAD. DM. asthma   OT comments  Pt with anxiety, fatigue with increased WOB and fluctuating 02 sats from 85% to 97% on 3L. Pt requiring 2 person assist to attempt ambulation and stand pivot back to bed today. Pt more alert, but requiring multimodal cues to follow commands. Will continue to follow.  Follow Up Recommendations  SNF    Equipment Recommendations  Hospital bed;Wheelchair cushion (measurements OT);Wheelchair (measurements OT)    Recommendations for Other Services      Precautions / Restrictions Precautions Precautions: Fall Precaution Comments: Watch O2 sats Restrictions Weight Bearing Restrictions: No       Mobility Bed Mobility Overal bed mobility: Needs Assistance Bed Mobility: Rolling;Sit to Supine Rolling: Max assist     Sit to supine: Mod assist   General bed mobility comments: assist for LEs back in bed, pt with difficulty sequencing rolling  Transfers Overall transfer level: Needs assistance Equipment used: Rolling walker (2 wheeled) Transfers: Sit to/from Stand;Stand Pivot Transfers   Stand pivot transfers: +2 physical assistance;Min assist       General transfer comment: physical cues for hand placement, assist to rise and gain balance, pt with posterior lean    Balance Overall balance assessment: Needs assistance   Sitting balance-Leahy Scale: Fair       Standing balance-Leahy Scale: Poor                             ADL either performed or assessed with clinical judgement   ADL Overall ADL's : Needs assistance/impaired     Grooming: Wash/dry face;Bed level;Set up               Lower Body Dressing: Total assistance;Sitting/lateral  leans Lower Body Dressing Details (indicate cue type and reason): socks             Functional mobility during ADLs: +2 for physical assistance;Minimal assistance;Rolling walker General ADL Comments: pt with improvement in level of alertness, becomes anxious with mobility with increased WOB     Vision       Perception     Praxis      Cognition Arousal/Alertness: Awake/alert Behavior During Therapy: Anxious Overall Cognitive Status: Impaired/Different from baseline Area of Impairment: Orientation;Following commands;Safety/judgement;Problem solving                 Orientation Level: Disoriented to;Time;Situation     Following Commands: Follows one step commands with increased time Safety/Judgement: Decreased awareness of safety   Problem Solving: Slow processing;Difficulty sequencing;Requires verbal cues General Comments: 02 sats decrease with anxiety        Exercises     Shoulder Instructions       General Comments      Pertinent Vitals/ Pain       Pain Assessment: Faces Faces Pain Scale: Hurts little more Pain Location: back Pain Descriptors / Indicators: Grimacing;Moaning;Discomfort Pain Intervention(s): Monitored during session;Repositioned  Home Living                                          Prior Functioning/Environment  Frequency  Min 2X/week        Progress Toward Goals  OT Goals(current goals can now be found in the care plan section)  Progress towards OT goals: Progressing toward goals  Acute Rehab OT Goals Patient Stated Goal: not stated OT Goal Formulation: Patient unable to participate in goal setting Time For Goal Achievement: 01/20/17 Potential to Achieve Goals: Good  Plan Discharge plan remains appropriate    Co-evaluation                 AM-PAC PT "6 Clicks" Daily Activity     Outcome Measure   Help from another person eating meals?: A Little Help from another person  taking care of personal grooming?: A Lot Help from another person toileting, which includes using toliet, bedpan, or urinal?: Total Help from another person bathing (including washing, rinsing, drying)?: Total Help from another person to put on and taking off regular upper body clothing?: A Lot Help from another person to put on and taking off regular lower body clothing?: Total 6 Click Score: 10    End of Session Equipment Utilized During Treatment: Gait belt;Rolling walker;Oxygen (3L)  OT Visit Diagnosis: Unsteadiness on feet (R26.81)   Activity Tolerance Patient limited by fatigue   Patient Left in bed;with call [place]/phone within reach;with bed alarm set   Nurse Communication  (aware of whole pills on tray)        Time: [id number] OT Time Calculation (min): 32 min  Charges: OT General Charges $OT Visit: 1 Procedure OT Treatments $Self Care/Home Management : 23-37 mins   Evern BioMayberry, Milda Lindvall Lynn 01/12/2017, 11:41 AM  [phone]

## 2017-01-12 NOTE — Progress Notes (Signed)
[facility] Progress Note   Subjective:  Extremely SOB when I was in to see O2 sats only 85-90 Unable to work with OT d/t SOB  (Plans for transfer to [facility] SNF today...) For HD this AM   Vitals:   01/12/17 0233 01/12/17 0239 01/12/17 0429 01/12/17 0725  BP:   (!) 153/86   Pulse: 98 97  (!) 103  Resp: 17 20  (!) 27  Temp:   97.8 F (36.6 C) 97.4 F (36.3 C)  TempSrc:   Oral Axillary  SpO2: 93% 92%  100%  Weight:   72.8 kg (160 lb 7.9 oz)   Height:       Exam: Awake, very anxious, air Increased WOB/RR near 30 Minimal breath sounds heard in right chest Regular/tachy Abd soft no ascites Ext no edema Nonfocal, Ox 2   Inpatient medications: . amLODipine  10 mg Oral QPM  . aspirin  81 mg Oral q morning - 10a  . atorvastatin  80 mg Oral QHS  . darbepoetin (ARANESP) injection - DIALYSIS  100 mcg Intravenous Q Tue-HD  . doxercalciferol  2 mcg Intravenous Q T,Th,Sa-HD  . feeding supplement (PRO-STAT SUGAR FREE 64)  30 mL Oral BID  . ferric citrate  420 mg Oral TID WC  . insulin aspart  0-9 Units Subcutaneous TID WC  . levofloxacin  500 mg Oral Q48H  . loratadine  10 mg Oral Daily  . mouth rinse  15 mL Mouth Rinse BID  . metoprolol tartrate  25 mg Oral BID  . multivitamin  1 tablet Oral QHS  . nystatin  5 mL Oral QID  . pantoprazole  40 mg Oral Daily  . sodium chloride flush  3 mL Intravenous Q12H    Recent Labs Lab 01/06/17 0443 01/07/17 0515  01/09/17 0308 01/11/17 0420 01/12/17 0432  NA 135 138  < > 135 132* 131*  K 3.7 3.7  < > 3.4* 3.7 3.9  CL 96* 95*  < > 95* 93* 91*  CO2 25 23  < > 25 27 26   GLUCOSE 216* 185*  < > 300* 245* 219*  BUN 21* 33*  < > 31* 31* 45*  CREATININE 4.39* 6.60*  < > 5.60* 5.07* 6.73*  CALCIUM 8.8* 9.2  < > 9.4 9.2 9.3  PHOS 2.9 3.7  --   --   --   --   < > = values in this interval not displayed.  Recent Labs Lab 01/05/17 1653 01/06/17 0443 01/07/17 0515  PROT 8.1  --   --   ALBUMIN  --  2.1* 2.1*     Recent Labs Lab 01/09/17 0308 01/11/17 0420 01/12/17 0432  WBC 16.8* 13.0* 14.5*  HGB 8.6* 8.5* 8.0*  HCT 27.1* 27.1* 25.5*  MCV 99.3 99.6 99.6  PLT 274 295 323   Iron/TIBC/Ferritin/ %Sat    Component Value Date/Time   IRON 84 12/14/2014 1443   TIBC 216 (L) 12/14/2014 1443   FERRITIN 1,385 (H) 12/14/2014 1443   IRONPCTSAT 39 12/14/2014 1443   Dialysis: TTS at [facility] 3:45hrs,  BFR 400, DFR A1.5,  2K/2.25Ca,  Profile 4,  EDW 67.5kg, lower at discharge AVG - Heparin 2000 unit bolus q HD - Hectoral IV q HD - Mircera q 2 weeks (just reordered, last given 11/24/16)  Assessment: 1. Hypoxemia/ large RLL and RML PNA/ exudative R pleural effusion - s/p IV abx ->levaquin, not surg candidate. No improvement on exam, and CXR w/large R effusion (5/20).  Thoracentesis earlier in adm 01/05/17 of 300 cc. By report some loculated/not accessible to tap.Today appears more resp distress and desatting some. Repeat CXR/ABG. I personally am not comfortable with transfer to SNF. Paged primary service no answer 2. ESRD HD TTS. EDW 67.5. Lower to at least 66.5. (Pulm wise will be better "drier" than wet) for HD today 3. Dementia - mild, was living alone PTA. Going to SNF at d/c 4. DM on po meds at home, SSI here 5. Anemia of CKD - starting ESA here (darbe 100 QTuesday). Hb down to 8. 6. DNR 7. Disposition - HD/SNF was plan. Pulm status will dictate if that can happen  Claudia Balynthia Katsumi Wisler, MD [facility] [phone] Pager 01/12/2017, 11:26 AM

## 2017-01-12 NOTE — Procedures (Signed)
I have personally attended this patient's dialysis session.   K 3.9->4K bath Very SOB Increasing UFR to 2.5 liters though I don't think this is a fluid overload issue Currently on NRB     Component Value Date/Time   PHART 7.409 01/12/2017 1248   PCO2ART 42.4 01/12/2017 1248   PO2ART 115 (H) 01/12/2017 1248   HCO3 26.3 01/12/2017 1248   TCO2 33 10/09/2011 1018   O2SAT 98.0 01/12/2017 1248  CXR pending FPTS was called earlier about her status.  Camille Balynthia Gurfateh Mcclain, MD [facility] [phone] Pager 01/12/2017, 1:23 PM

## 2017-01-13 DIAGNOSIS — I2510 Atherosclerotic heart disease of native coronary artery without angina pectoris: Secondary | ICD-10-CM

## 2017-01-13 LAB — BASIC METABOLIC PANEL
Anion gap: 13 (ref 5–15)
BUN: 21 mg/dL — ABNORMAL HIGH (ref 6–20)
CALCIUM: 9.2 mg/dL (ref 8.9–10.3)
CO2: 25 mmol/L (ref 22–32)
CREATININE: 4.52 mg/dL — AB (ref 0.44–1.00)
Chloride: 95 mmol/L — ABNORMAL LOW (ref 101–111)
GFR, EST AFRICAN AMERICAN: 9 mL/min — AB (ref >60)
GFR, EST NON AFRICAN AMERICAN: 8 mL/min — AB (ref >60)
Glucose, Bld: 256 mg/dL — ABNORMAL HIGH (ref 65–99)
Potassium: 4.7 mmol/L (ref 3.5–5.1)
SODIUM: 133 mmol/L — AB (ref 135–145)

## 2017-01-13 LAB — CBC
HCT: 26.9 % — ABNORMAL LOW (ref 36.0–46.0)
Hemoglobin: 8.2 g/dL — ABNORMAL LOW (ref 12.0–15.0)
MCH: 30.7 pg (ref 26.0–34.0)
MCHC: 30.5 g/dL (ref 30.0–36.0)
MCV: 100.7 fL — ABNORMAL HIGH (ref 78.0–100.0)
PLATELETS: 342 10*3/uL (ref 150–400)
RBC: 2.67 MIL/uL — ABNORMAL LOW (ref 3.87–5.11)
RDW: 18 % — AB (ref 11.5–15.5)
WBC: 11.8 10*3/uL — ABNORMAL HIGH (ref 4.0–10.5)

## 2017-01-13 LAB — GLUCOSE, CAPILLARY
GLUCOSE-CAPILLARY: 313 mg/dL — AB (ref 65–99)
Glucose-Capillary: 247 mg/dL — ABNORMAL HIGH (ref 65–99)
Glucose-Capillary: 325 mg/dL — ABNORMAL HIGH (ref 65–99)
Glucose-Capillary: 200 mg/dL — ABNORMAL HIGH (ref 65–99)

## 2017-01-13 LAB — BLOOD GAS, ARTERIAL
ACID-BASE EXCESS: 2.1 mmol/L — AB (ref 0.0–2.0)
BICARBONATE: 26.3 mmol/L (ref 20.0–28.0)
Drawn by: 301361
O2 Content: 5 L/min
O2 Saturation: 98 %
PCO2 ART: 42.4 mmHg (ref 32.0–48.0)
PH ART: 7.409 (ref 7.350–7.450)
Patient temperature: 98.3
pO2, Arterial: 115 mmHg — ABNORMAL HIGH (ref 83.0–108.0)

## 2017-01-13 MED ORDER — MORPHINE SULFATE (CONCENTRATE) 10 MG/0.5ML PO SOLN
5.0000 mg | ORAL | Status: DC | PRN
  Administered 2017-01-13 – 2017-01-14 (×5): 5 mg via ORAL
  Filled 2017-01-13 (×5): qty 0.5

## 2017-01-13 MED ORDER — TIOTROPIUM BROMIDE MONOHYDRATE 18 MCG IN CAPS
18.0000 ug | ORAL_CAPSULE | Freq: Every day | RESPIRATORY_TRACT | Status: DC
  Administered 2017-01-14 – 2017-01-16 (×3): 18 ug via RESPIRATORY_TRACT
  Filled 2017-01-13: qty 5

## 2017-01-13 MED ORDER — MOMETASONE FURO-FORMOTEROL FUM 200-5 MCG/ACT IN AERO
2.0000 | INHALATION_SPRAY | Freq: Two times a day (BID) | RESPIRATORY_TRACT | Status: DC
  Administered 2017-01-13 – 2017-01-16 (×6): 2 via RESPIRATORY_TRACT
  Filled 2017-01-13: qty 8.8

## 2017-01-13 NOTE — Progress Notes (Signed)
[facility] Progress Note   Subjective:  Had HD yesterday Weights not accurate 2 liters off with hypotension during TMT yesterday and severe SOB + anxiety Desatting again since  Vitals:   01/13/17 0334 01/13/17 0340 01/13/17 0452 01/13/17 0913  BP:    (!) 120/57  Pulse: (!) 104 (!) 103 (!) 103 (!) 104  Resp:   (!) 24   Temp:      TempSrc:      SpO2: (!) 66% 92%    Weight:      Height:       Exam: VS as noted Awake, very anxious, air hunger intermittently Hardly any BS heard in R chest Regular/tachy low 100's Abd soft no ascites Ext no edema Nonfocal  Inpatient medications: . amLODipine  10 mg Oral QPM  . aspirin  81 mg Oral q morning - 10a  . atorvastatin  80 mg Oral QHS  . darbepoetin (ARANESP) injection - DIALYSIS  100 mcg Intravenous Q Tue-HD  . doxercalciferol  2 mcg Intravenous Q T,Th,Sa-HD  . feeding supplement (PRO-STAT SUGAR FREE 64)  30 mL Oral BID  . ferric citrate  420 mg Oral TID WC  . insulin aspart  0-9 Units Subcutaneous TID WC  . levofloxacin  500 mg Oral Q48H  . loratadine  10 mg Oral Daily  . mouth rinse  15 mL Mouth Rinse BID  . metoprolol tartrate  25 mg Oral BID  . multivitamin  1 tablet Oral QHS  . nystatin  5 mL Oral QID  . pantoprazole  40 mg Oral Daily  . sodium chloride flush  3 mL Intravenous Q12H    Recent Labs Lab 01/07/17 0515  01/11/17 0420 01/12/17 0432 01/13/17 0825  NA 138  < > 132* 131* 133*  K 3.7  < > 3.7 3.9 4.7  CL 95*  < > 93* 91* 95*  CO2 23  < > 27 26 25   GLUCOSE 185*  < > 245* 219* 256*  BUN 33*  < > 31* 45* 21*  CREATININE 6.60*  < > 5.07* 6.73* 4.52*  CALCIUM 9.2  < > 9.2 9.3 9.2  PHOS 3.7  --   --   --   --   < > = values in this interval not displayed.  Recent Labs Lab 01/07/17 0515  ALBUMIN 2.1*    Recent Labs Lab 01/11/17 0420 01/12/17 0432 01/13/17 0825  WBC 13.0* 14.5* 11.8*  HGB 8.5* 8.0* 8.2*  HCT 27.1* 25.5* 26.9*  MCV 99.6 99.6 100.7*  PLT 295 323 342    Dg Chest  Port 1 View  Result Date: 01/12/2017 CLINICAL DATA:  Dyspnea EXAM: PORTABLE CHEST 1 VIEW COMPARISON:  01/10/2017 chest radiograph. FINDINGS: Right rotated chest radiograph. Vascular stent in the upper mediastinum appears stable. Stable cardiomediastinal silhouette with normal heart size. No pneumothorax. Large right pleural effusion is stable. Trace left pleural effusion is stable. Complete opacification of the mid to lower right lung, unchanged. Mild patchy left lung base opacity is unchanged. IMPRESSION: Stable chest radiograph with large right and trace left pleural effusions, complete opacification of the mid to lower right lung and mild patchy left lung base opacity. Electronically Signed   By: Delbert Phenix M.D.   On: 01/12/2017 17:35   Dialysis:  TTS at [facility] 3:45hrs,  BFR 400, DFR A1.5,  2K/2.25Ca,  Profile 4,  EDW 67.5kg AVG - Heparin 2000 unit bolus q HD - Hectoral IV q HD - Mircera q 2  weeks (just reordered, last given 11/24/16)  Assessment: 1. Hypoxemia/ large RLL and RML PNA/ exudative R pleural effusion - s/p IV abx ->levaquin, not surg candidate. No improvement on exam (in fact clinically getting worse). Thoracentesis earlier in adm 01/05/17 of 300 cc. By report some loculated/not accessible to tap.5/22 progressively more dyspneic, CXR near opacification of R chest. Primary team to re consult pulmonary.  2. ESRD HD TTS. Need accurate weights. Hd 5/24 3. Dementia - mild, was living alone PTA. SNF at d/c 4. DM on po meds at home, SSI here 5. Anemia of CKD - starting ESA here (darbe 100 QTuesday). Hb down to 8. 6. DNR 7. Disposition - HD/SNF was plan. Pulm status will dictate if that can happen. Primary service assessing.  Claudia Balynthia Sirinity Outland, MD [facility] [phone] Pager 01/13/2017, 11:17 AM

## 2017-01-13 NOTE — Plan of Care (Signed)
Problem: Nutrition: Goal: Adequate nutrition will be maintained Outcome: Not Progressing Patient is on a Dys 1, thin liquids diet, Patient on a NRB mask and struggles with eating and breathing, becomes short of breath in between bites.

## 2017-01-13 NOTE — Progress Notes (Signed)
Daily Progress Note   Patient Name: Claudia Rich       Date: 01/13/2017 DOB: [birth year]  Age: 83 y.o. MRN#: [id number] Attending Physician: Nestor RampNeal, Sara L, MD Primary Care Physician: Araceli Boucheumley, Hamilton N, DO Admit Date: 01/03/2017  Reason for Consultation/Follow-up: Establishing goals of care and Non pain symptom management  Subjective: Sitting on edge of chair. Nasal cannula in place- O2 sat in 90 s RR in 30's. Confused. Discussed with RN. Patient gets very anxious when SOB.  Per chart review patient is not clinically improving. Continues to desat into 60's and is requiring NRB oxygen support.  Review of Systems  Unable to perform ROS: Dementia    Length of Stay: 10  Current Medications: Scheduled Meds:  . amLODipine  10 mg Oral QPM  . aspirin  81 mg Oral q morning - 10a  . atorvastatin  80 mg Oral QHS  . darbepoetin (ARANESP) injection - DIALYSIS  100 mcg Intravenous Q Tue-HD  . doxercalciferol  2 mcg Intravenous Q T,Th,Sa-HD  . feeding supplement (PRO-STAT SUGAR FREE 64)  30 mL Oral BID  . ferric citrate  420 mg Oral TID WC  . insulin aspart  0-9 Units Subcutaneous TID WC  . levofloxacin  500 mg Oral Q48H  . loratadine  10 mg Oral Daily  . mouth rinse  15 mL Mouth Rinse BID  . metoprolol tartrate  25 mg Oral BID  . multivitamin  1 tablet Oral QHS  . nystatin  5 mL Oral QID  . pantoprazole  40 mg Oral Daily  . sodium chloride flush  3 mL Intravenous Q12H    Continuous Infusions:   PRN Meds: acetaminophen **OR** acetaminophen, albuterol, alum & mag hydroxide-simeth, docusate sodium, morphine, polyethylene glycol  Physical Exam  Cardiovascular: Normal rate.   Pulmonary/Chest: Tachypnea noted. She has decreased breath sounds.  Wet, productive cough  Neurological:    Lehtargic, not oriented to place or time  Nursing note and vitals reviewed.           Vital Signs: BP 126/74 (BP Location: Right Arm)   Pulse 93   Temp 98.6 F (37 C) (Axillary)   Resp 20   Ht 5\' 4"  (1.626 m)   Wt 68.7 kg (151 lb 7.3 oz)   SpO2 94%   BMI 26.00 kg/m  SpO2:  SpO2: 94 % O2 Device: O2 Device: NRB O2 Flow Rate: O2 Flow Rate (L/min): 15 L/min  Intake/output summary:  Intake/Output Summary (Last 24 hours) at 01/13/17 1404 Last data filed at 01/13/17 1610  Gross per 24 hour  Intake              263 ml  Output             2064 ml  Net            -1801 ml   LBM: Last BM Date: 01/12/17 Baseline Weight: Weight: 66.7 kg (147 lb) Most recent weight: Weight: 68.7 kg (151 lb 7.3 oz)       Palliative Assessment/Data: PPS: 40%   Flowsheet Rows     Most Recent Value  Intake Tab  Referral Department  Hospitalist  Unit at Time of Referral  Med/Surg Unit  Palliative Care Primary Diagnosis  Pulmonary  Date Notified  01/06/17  Palliative Care Type  New Palliative care  Reason for referral  Clarify Goals of Care  Date of Admission  01/03/17  Date first seen by Palliative Care  01/06/17  # of days Palliative referral response time  0 Day(s)  # of days IP prior to Palliative referral  3  Clinical Assessment  Palliative Performance Scale Score  30%  Psychosocial & Spiritual Assessment  Palliative Care Outcomes      Patient Active Problem List   Diagnosis Date Noted  . Hypoxia   . Adult failure to thrive   . Weakness   . Altered mental status   . Palliative care by specialist   . DNR (do not resuscitate)   . S/P thoracentesis   . Pressure injury of skin 01/04/2017  . ESRD on hemodialysis (HCC)   . HCAP (healthcare-associated pneumonia) 01/03/2017  . Pain of fifth toe 12/26/2016  . Chronic left hip pain 02/11/2016  . Trochanteric bursitis of left hip 01/17/2016  . Cutaneous horn 01/17/2016  . Balance problem 02/27/2015  . Preventative health care 12/14/2014   . DM2 (diabetes mellitus, type 2) (HCC) 12/14/2014  . Back pain 12/14/2014  . Hearing difficulty 10/10/2013  . Chronic restrictive lung disease 01/23/2013  . Pancreatic cyst 01/23/2013  . Dementia 07/02/2012  . CAD (coronary artery disease) 05/23/2012  . Pleural effusion 01/05/2011  . ESRD on dialysis (HCC) 05/07/2010  . Macrocytic anemia 03/20/2009  . Essential hypertension, benign 06/10/2007  . Hyperlipidemia 10/29/2006  . G E R D 10/29/2006    Palliative Care Assessment & Plan   Patient Profile:  83 y.o. female   admitted on 01/03/2017 with altered mental status from home where she lives alone.  PMH significant for ESRD on HD, COPD, PVD, mild dementia, DM.  Workup revealed extensive pneumonia with pleural effusions requiring thoracentesis. Palliative medicine consulted for GOC.  Assessment/Recommendations/Plan   Morphine intensol SL 5mg  q1h prn SOB or anxiety  Will attempt to contact family to discuss further GOC  Goals of Care and Additional Recommendations:  Limitations on Scope of Treatment: Full Scope Treatment  Code Status:  DNR  Prognosis:   Unable to determine  Discharge Planning:  To Be Determined  Care plan was discussed with patient's RN- Claudia Rich.  Thank you for allowing the Palliative Medicine Team to assist in the care of this patient.   Time In: 1345 Time Out: 1410 Total Time 25 mins Prolonged Time Billed No      Greater than 50%  of this time was spent counseling and coordinating care  related to the above assessment and plan.  Claudia Rich, AGNP-C Palliative Medicine   Please contact Palliative Medicine Team phone at [phone] for questions and concerns.

## 2017-01-13 NOTE — Progress Notes (Signed)
Name: Claudia Rich MRN: [id number] DOB: 12-Sep-1926    ADMISSION DATE:  01/03/2017 CONSULTATION DATE: 01/05/2017  REFERRING MD : Dr. Jennette KettleNeal  CHIEF COMPLAINT:  Dyspnea, Large R pleural effusion  BRIEF PATIENT DESCRIPTION: 83 y/o F admitted 5/13 with SOB / AMS.  PMH of ESRD on HD TTS, COPD, PVD, dementia, stroke, CAD, T2DM, and asthma. CTA chest negative for PE on 5/14 but showed LLL infiltrate and large right pleural effusion with RML/RLL collapse.  S/P R thora 5/15 w/ 300 ml of cloudy yellow pleural fluid.  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  Right pleural fluid was exudative by LDH (ratio 2) and cytology was inflammatory with no indications of cancer.   SUBJECTIVE:  Worsening respiratory status overnight requiring NRB and increased WOB At present on 4L [place] at 99-100%, normal WOB Afebrile   VITAL SIGNS: Temp:  [98.6 F (37 C)] 98.6 F (37 C) (05/22 2300) Pulse Rate:  [93-118] 93 (05/23 1202) Resp:  [20-28] 20 (05/23 1202) BP: (89-126)/(48-74) 126/74 (05/23 1202) SpO2:  [66 %-100 %] 94 % (05/23 1202)  PHYSICAL EXAMINATION: General:  Elderly female sitting in bedside chair napping, in NAD HEENT: MM pink/moist PSY: Confused Neuro: Awakens, confused, conversant, MAE CV: s1s2 rrr, no m/r/g PULM: Clear anteriorly, decreased breath sounds on right mid and lower, mild left basilar rales, normal work of breathing, speaking full sentences, 99-100% on 4L [place], no wheezing HQ:IONGGI:soft, non-tender, bsx4 active  Extremities: warm/dry, no edema, AVF to left upper arm- dressing intact Skin: no rashes or lesions   Recent Labs Lab 01/11/17 0420 01/12/17 0432 01/13/17 0825  NA 132* 131* 133*  K 3.7 3.9 4.7  CL 93* 91* 95*  CO2 27 26 25   BUN 31* 45* 21*  CREATININE  5.07* 6.73* 4.52*  GLUCOSE 245* 219* 256*    Recent Labs Lab 01/11/17 0420 01/12/17 0432 01/13/17 0825  HGB 8.5* 8.0* 8.2*  HCT 27.1* 25.5* 26.9*  WBC 13.0* 14.5* 11.8*  PLT 295 323 342   Dg Chest Port 1 View  Result Date: 01/12/2017 CLINICAL DATA:  Dyspnea EXAM: PORTABLE CHEST 1 VIEW COMPARISON:  01/10/2017 chest radiograph. FINDINGS: Right rotated chest radiograph. Vascular stent in the upper mediastinum appears stable. Stable cardiomediastinal silhouette with normal heart size. No pneumothorax. Large right pleural effusion is stable. Trace left pleural effusion is stable. Complete opacification of the mid to lower right lung, unchanged. Mild patchy left lung base opacity is unchanged. IMPRESSION: Stable chest radiograph with large right and trace left pleural effusions, complete opacification of the mid to lower right lung and mild patchy left lung base opacity. Electronically Signed   By: Delbert PhenixJason A Poff M.D.   On: 01/12/2017 17:35   SIGNIFICANT EVENTS  5/13  Admit to FPTS for SOB, AMS 5/14  CTA negative for PE, + for large right pleural effusion and LLL infiltrate  STUDIES:  CTA Chest 5/14 >> Negative for PE, Large right pleural effusion layering dependently with complete collapse of the right lower lobe, and near complete collapse of the right middle lobe. Dependent atelectasis in the upper lobe. Patchy pneumonia left lower lobe.  Left lower lobe bronchiectasis. Pleural Cytology 5/15 >> inflammatory CT Head 5/16 >>  negative  CULTURES: BCx2 5/14 >>  RVP 5/14 >> negative R pleural fluid  5/15 >> LDH ratio 2, ngtd, PMN cells    ASSESSMENT / PLAN:  83 year old female with ESRD who presents to the hospital with AMS and SOB. CT of the chest showed LLL infiltrate and large right pleural effusion with RML/RLL collapse s/p thora on 5/15 that was exudative by LDH ratio of 2.  Cytolgoy with inflammatory cells, no cancer.    Pleural effusion: Thora performed 5/15, exudative with  inflammatory cells.  This is likely an older fibrothorax and nothing further to be done.  Patient is not a candidate for VATS or CT w/lytic therapy. - Could ask IR to evaluate again for pleurx catheter for palliation efforts - Support symptomatically - Consider palliation at this point given her dementia and DNR status  Acute Hypoxic Respiratory Failure - multifactorial in the setting of LLL CAP, bronchiectasis, underlying COPD, pulmonary HTN and large right pleural effusion.  ? Intermittent mucous plugging - continue to titrate for Sats of 88-92 %  - continue pulmonary hygiene with flutter valve - Resume home spiriva and dulera (on advair at home)  - Abx per primary team - May need home O2 - Consider ambulatory desaturation study to see if she qualifies for home O2   Pulmonary HTN: 36 mmHg 5 years ago - Last TTE 05/2012, doubt she would be a candidate for therapy given other comorbidities  Posey Boyer, AGACNP-BC [facility] Pgr: [phone] or if no answer [phone] 01/13/2017, 2:12 PM

## 2017-01-13 NOTE — Progress Notes (Signed)
Physical Therapy Treatment Patient Details Name: Claudia Rich MRN: [id number] DOB: [birth year] Today's Date: 01/13/2017    History of Present Illness 83 yo female admitted with AMS  LLL infiltrate and L R pleural effusion with RML/ RLL collapse. PMH: ESRD TTHSAT COPD, PVD, dementia, CAD. DM. asthma.  s/p R throacentesis on 01/05/17.  Put on NRB to maintain sats on 01/12/17.     PT Comments    Pt is now on NRB mask to help maintain sats.  She was able to tolerate seated leg and arm exercises and practice sit to stand transitions working on standing endurance and posture with RW.  Pt with poor endurance.  RR up to 34 during mobility and exercises.  Pt maintained sats >94% on NRB mask 15 L O2.  PT will continue to follow acutely.  Frequency changed to match department protocols based on d/c plan, but pt would benefit from more days per week of therapy at SNF.    Follow Up Recommendations  SNF     Equipment Recommendations  Rolling walker with 5" wheels;3in1 (PT)    Recommendations for Other Services   NA     Precautions / Restrictions Precautions Precautions: Fall;Other (comment) Precaution Comments: O2 sats    Mobility  Bed Mobility               General bed mobility comments: Pt was OOB in the chair.   Transfers Overall transfer level: Needs assistance Equipment used: Rolling walker (2 wheeled) Transfers: Sit to/from Stand Sit to Stand: Min assist         General transfer comment: Min assist to power up to standing from low recliner chair x 2.  Attempted to stand as long as possible before muscle and respiratory fatigue with good upright posutre.  Verbal cues for safe hand placement.   Ambulation/Gait             General Gait Details: did not attempt at this time as pt continues to need NRB and with basic mobility and seated AA exercises pt's RR rate increased to the mid 30s.  We will gradually progress back to gait as pt's repiratory status allows.                        Balance Overall balance assessment: Needs assistance Sitting-balance support: Feet supported;Bilateral upper extremity supported Sitting balance-Leahy Scale: Fair     Standing balance support: Bilateral upper extremity supported Standing balance-Leahy Scale: Poor Standing balance comment: needs support from RW and therapist to prevent posterior LOB.  Only able to stand <30 seconds x 2 before both muscular (LEs) and repiratory (increased RR ) fatigue.  Pt was able to maintain sats >94% on NRB throughout mobility and exercises.                             Cognition Arousal/Alertness: Awake/alert Behavior During Therapy: Anxious Overall Cognitive Status:  (not specifically tested and hard to say since not talking)                                        Exercises General Exercises - Upper Extremity Shoulder Flexion: AAROM;Both;10 reps Elbow Flexion: AAROM;Both;10 reps Elbow Extension: AAROM;Both;10 reps General Exercises - Lower Extremity Long Arc Quad: AROM;Both;10 reps Hip ABduction/ADduction: AAROM;Both;10 reps Hip Flexion/Marching: AAROM;Both;10 reps Toe Raises: AROM;Both;20  reps Heel Raises: AROM;Both;20 reps        Pertinent Vitals/Pain Pain Assessment: Faces Pain Score: 0-No pain           PT Goals (current goals can now be found in the care plan section) Acute Rehab PT Goals Patient Stated Goal: not stated.  Pt doesn't say much .  Progress towards PT goals: Not progressing toward goals - comment (repiratory status is worse)    Frequency    Min 2X/week      PT Plan Current plan remains appropriate;Frequency needs to be updated       AM-PAC PT "6 Clicks" Daily Activity  Outcome Measure  Difficulty turning over in bed (including adjusting bedclothes, sheets and blankets)?: Total Difficulty moving from lying on back to sitting on the side of the bed? : Total Difficulty sitting down on and standing up from a  chair with arms (e.g., wheelchair, bedside commode, etc,.)?: Total Help needed moving to and from a bed to chair (including a wheelchair)?: A Little Help needed walking in hospital room?: A Lot Help needed climbing 3-5 steps with a railing? : Total 6 Click Score: 9    End of Session Equipment Utilized During Treatment: Oxygen (NRB mask, 15 L O2) Activity Tolerance: Patient limited by fatigue;Other (comment) (limited by DOE, increased RR) Patient left: in chair;with call [place]/phone within reach;with family/visitor present;with chair alarm set   PT Visit Diagnosis: Muscle weakness (generalized) (M62.81);Difficulty in walking, not elsewhere classified (R26.2);Unsteadiness on feet (R26.81);Other (comment) (decreased activity tolerance)     Time: 1914-7829 PT Time Calculation (min) (ACUTE ONLY): 19 min  Charges:  $Therapeutic Exercise: 8-22 mins             Cathy Crounse B. Elenor Wildes, PT, DPT [phone]           01/13/2017, 12:26 PM

## 2017-01-13 NOTE — Progress Notes (Signed)
Pt very restless w/ desaturation down to 65%. Placed on non-rebreather. RT administered breathing treatment w/ improvement in O2 status to >90%  Now sleeping. Continue to monitor.

## 2017-01-13 NOTE — Progress Notes (Signed)
  Speech Language Pathology Treatment: Dysphagia  Patient Details Name: Claudia Rich MRN: [id number] DOB: 1926-11-28 Today's Date: 01/13/2017 Time: 9810-2548 SLP Time Calculation (min) (ACUTE ONLY): 8 min  Assessment / Plan / Recommendation Clinical Impression  Pt seen with lunch meal, had already consumed a few bites of soft vegetables and applesauce but refused other solids due to lack of appetite. No signs of aspiration with thin liquids. Pt does become fatugued with intake dur to respiratory function. She is happy with softer foods, but would not want pureed solids. Dys 2 (fine chip) is the best texture at this time. Recommend pt continue diet, no SLP f/u needed in acute care, would benefit from f/u at next level of care for eventual diet advancement if respiratory endurance improves.   HPI HPI: 83 yo female admitted with AMS LLL infiltrate and L R pleural effusion with RML/ RLL collapse. PMH: ESRD TTHSAT COPD, PVD, dementia, CAD. DM. asthma. Thoracentesis 5/15. Significant deterioration from baseline function. Palliative medicine is following.      SLP Plan  All goals met       Recommendations  Diet recommendations: Dysphagia 2 (fine chop);Thin liquid Liquids provided via: Cup;Straw Medication Administration: Whole meds with puree Supervision: Staff to assist with self feeding Compensations: Minimize environmental distractions;Slow rate;Small sips/bites Postural Changes and/or Swallow Maneuvers: Seated upright 90 degrees                Plan: All goals met       GO                Shalaya Swailes, Katherene Ponto 01/13/2017, 2:15 PM

## 2017-01-13 NOTE — Plan of Care (Signed)
Problem: Safety: Goal: Ability to remain free from injury will improve Outcome: Not Progressing Pt is till impulsive and hanging feet out of bed. Bed and chair alarm in use. Repositioned for comfort. Needs addressed. Will continue to monitor.  Problem: Pain Managment: Goal: General experience of comfort will improve Outcome: Not Progressing Pt reports no improvement in pain. Back/buttock/ and neck. Related to positioning. Attempted to reposition patient. Very minimal comfort abel to me maintained.  Problem: Skin Integrity: Goal: Risk for impaired skin integrity will decrease Outcome: Not Progressing Pt is non compliant w/ positioning. Only like to sit >45 degrees. Pillows in place for positioning. Educated and repositioned patient. Foam dressing in place. Skin kept clean/dry/intact. Continue to monitor.

## 2017-01-13 NOTE — Progress Notes (Signed)
Nutrition Follow-up  DOCUMENTATION CODES:   Not applicable  INTERVENTION:    Continue Pro-stat 30 ml BID, each supplement provides 100 kcal and 15 gm protein.  Continue Magic cup TID with meals, each supplement provides 290 kcal and 9 grams of protein  NUTRITION DIAGNOSIS:   Increased nutrient needs related to wound healing as evidenced by estimated needs.  Ongoing  GOAL:   Patient will meet greater than or equal to 90% of their needs   Progressing  MONITOR:   PO intake, Supplement acceptance, Skin  ASSESSMENT:   83 y.o. female presenting with SOB, AMS. PMH is significant for ESRD on HD, dementia, CAD, T2DM, asthma.  PO intake improving; patient is consuming 60-100% of meals. Receiving Magic cup with meals. S/P HD yesterday. Weights are inaccurate. Currently requiring nonrebreather mask. Labs and medications reviewed. CBG's: 247-325 Sodium 133 (L), Potassium 4.7 WNL  Diet Order:  Diet renal/carb modified with fluid restriction Diet-HS Snack? Nothing; Room service appropriate? Yes; Fluid consistency: Thin; Fluid restriction: 1200 mL Fluid  Skin:  Wound (see comment) (stage II to buttocks)  Last BM:  5/22  Height:   Ht Readings from Last 1 Encounters:  01/04/17 5\' 4"  (1.626 m)    Weight:   Wt Readings from Last 1 Encounters:  01/12/17 151 lb 7.3 oz (68.7 kg)    Ideal Body Weight:  54.5 kg  BMI:  Body mass index is 26 kg/m.  Estimated Nutritional Needs:   Kcal:  1500-1700  Protein:  80-100 gm  Fluid:  1.7 L  EDUCATION NEEDS:   No education needs identified at this time  Joaquin CourtsKimberly Melonee Gerstel, RD, LDN, CNSC Pager [phone] After Hours Pager [phone]

## 2017-01-13 NOTE — Progress Notes (Signed)
Family Medicine Teaching Service Daily Progress Note Intern Pager: [phone]  Patient name: Claudia Rich Medical record number: [id number] Date of birth: 05-21-1927 Age: 83 y.o. Gender: female  Primary Care Provider: Araceli Bouche, DO Consultants: Renal Code Status: Full  Pt Overview and Major Events to Date:  5/13 admit to FPTS  Assessment and Plan: Claudia Rich is a 83 y.o. female presenting with SOB, AMS . PMH is significant for ESRD on HD, dementia, CAD, T2DM, asthma.  #SOB with Hypoxia secondary to Pneumonia Patient is day #7 s/p thoracentesis by pulmonology with 300 ml cloudy pleural fluid obtained in the setting of a RML pneumonia. Loculated pocket of fluid unable to be removed. During the afternoon, patient was on non rebreather for desaturation into the 60's.  Patient is currently on nonrebreather, although had removed it from her face with good O2 sat on my exam. --Continue telemetry --Continue Levaquin 750 mg loading dose (5/20) DAY 1 and 500 mg q48h Start 5/22 --O2 prn saturations <92% --reconsult pulm today --consider CTA c/w tachycardia --consider additional goals of care discussions, ?palliative with morphine for air hunger  #AMS, improving Patient's mental status have been improving in the past few days likely combination of baseline dementia and delirium. She is much improved and almost back to baseline. --Will continue to monitor --Reorient as needed --Delirium precautions, family at bedside, open blinds  #HTN  Patient is currently on metoprolol 25 mg and norvasc 10 mg. Tachycardia noted overnight.  --Continue amlodipine 10 mg daily --Continue metoprolol 25 mg bid   #ESRD on HD Patient is on Tu,Th, Sat schedule. Patient is tolerating well, no complications. Good volume out, patient also receiving Darbepoetin alfa and Hemoglobin has been stable.  -- Nephrology following, appreciate recs  #DM2 11/2016 A1c 7.9. Patient's po intake continue to improve as seen  with increase in short acting insulin use. --sSSI while hospitalized --BG checks qAC/hs  #Pressure wound Patient has stage II pressure ulcer wound on the right buttock. Appears dry and is being assess regularly by nursing. Patient endorsed some discomfort yesterday and requested pain meds after tylenol did not provide any relief. --Would consult wound care as needed --Will follow up on pain   #GERD w/ h/o peptic ulcer --Continue pantoprazole 40 mg daily    #Oral Thrush --Continue Nystatin, will reassess  FEN/GI: Dysphagia 2 Fine chop, meds whole w/ pudding, HH/Carb mod renal diet, Protonix Prophylaxis: Lovenox subq renal dosed   Disposition: continued inpatient management of respiratory status  Subjective:  Patient doesn't feel well this morning. On nonrebreather, but had this on her chin with normal O2 sats. Mental status appears at baseline.   Objective: Temp:  [97.4 F (36.3 C)-98.6 F (37 C)] 98.6 F (37 C) (05/22 2300) Pulse Rate:  [94-118] 103 (05/23 0452) Resp:  [21-28] 24 (05/23 0452) BP: (89-155)/(48-70) 123/48 (05/22 2300) SpO2:  [66 %-100 %] 92 % (05/23 0340) Weight:  [151 lb 7.3 oz (68.7 kg)] 151 lb 7.3 oz (68.7 kg) (05/22 1305)   Physical Exam:  General: patient appears uncomfortable, breathing comfortably Cardiovascular: RRR, no m/r/g Respiratory: minimal air movement throughout right lung fields, moving air clearly upper and lower lobes on the left. Abdomen: soft and nontender, nondistended Extremities: no LE edema Neuro: AOx3, no focal deficits  Laboratory:  Recent Labs Lab 01/09/17 0308 01/11/17 0420 01/12/17 0432  WBC 16.8* 13.0* 14.5*  HGB 8.6* 8.5* 8.0*  HCT 27.1* 27.1* 25.5*  PLT 274 295 323    Recent Labs Lab  01/09/17 0308 01/11/17 0420 01/12/17 0432  NA 135 132* 131*  K 3.4* 3.7 3.9  CL 95* 93* 91*  CO2 25 27 26   BUN 31* 31* 45*  CREATININE 5.60* 5.07* 6.73*  CALCIUM 9.4 9.2 9.3  GLUCOSE 300* 245* 219*     Imaging/Diagnostic Tests: Dg Chest 2 View: 01/03/2017 FINDINGS: Moderate right pleural effusion. Associated right lower lobe opacity, likely compressive atelectasis. No frank interstitial edema.  No pneumothorax. The heart is normal in size. Vascular stent overlying the mediastinum. Old left rib fracture deformities.  IMPRESSION: Moderate right pleural effusion. Associated right lower lobe opacity, likely compressive atelectasis.   Ct Angio Chest Pe W Or Wo Contrast  01/04/2017 IMPRESSION: Large right pleural effusion layering dependently with subtotal collapse of the right lung. Patchy pneumonia left lower lobe.  Left lower lobe bronchiectasis. 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 Aortic and coronary artery calcification. Multiple venous collaterals suggest stenosis of the the SVC at the SVC innominate junction. Innominate stent in place. Uncertain if this is patent.    Claudia Rich, Claudia Lipson, MD 01/13/2017, 7:11 AM PGY-1, [facility] FPTS Intern pager: [phone], text pages welcome

## 2017-01-13 NOTE — Clinical Social Work Note (Signed)
Clinical Social Worker continuing to follow patient and family for support and discharge planning needs.  Patient continues with nonrebreather at this time, therefore not medically [facility]  CSW remains available for support and to facilitate patient discharge needs once medically stable.  Macario GoldsJesse Woods Gangemi, Kentucky[place]CS[place] [phone]

## 2017-01-13 NOTE — Progress Notes (Signed)
Inpatient Diabetes Program Recommendations  AACE/ADA: New Consensus Statement on Inpatient Glycemic Control (2015)  Target Ranges:  Prepandial:   less than 140 mg/dL      Peak postprandial:   less than 180 mg/dL (1-2 hours)      Critically ill patients:  140 - 180 mg/dL   Results for Claudia Rich, Claudia Rich (MRN [id number]) as of 01/13/2017 10:24  Ref. Range 01/12/2017 07:24 01/12/2017 11:36 01/12/2017 17:52 01/12/2017 21:34 01/13/2017 08:03  Glucose-Capillary Latest Ref Range: 65 - 99 mg/dL 469222 (H)  Novolog 3 units 279 (H)  Novolog 5 units 109 (H) 243 (H) 247 (H)  Novolog 3 units   Review of Glycemic Control  Diabetes history:DM2 Outpatient Diabetes medications: Januvia 25 mg daily Current orders for Inpatient glycemic control: Novolog 0-9 units TID with meals  Inpatient Diabetes Program Recommendations: Insulin - Basal: Fasting glucose 247 mg/dl today. While inpatient, please consider ordering Levemir 7 units Q24H starting now (based on 68 kg x 0.1 units). Correction (SSI): Please consider ordering Novolog 0-5 units QHS for bedtime correction scale. Insulin-Meal Coverage: If post prandial glucose is consistently elevated despite basal insulin, please consider ordering Novolog 3 units TID with meals for meal coverage if patient eats at least 50% of meals. HgbA1C:A1C 7.9% on 11/23/16 indicating an average glucose of 180 mg/dl.  Thanks, Orlando PennerMarie Kenden Brandt, RN, MSN, CDE Diabetes Coordinator Inpatient Diabetes Program [phone] (Team Pager from 8am to 5pm)

## 2017-01-14 DIAGNOSIS — F0391 Unspecified dementia with behavioral disturbance: Secondary | ICD-10-CM

## 2017-01-14 LAB — CBC
HEMATOCRIT: 25.1 % — AB (ref 36.0–46.0)
Hemoglobin: 7.6 g/dL — ABNORMAL LOW (ref 12.0–15.0)
MCH: 30.3 pg (ref 26.0–34.0)
MCHC: 30.3 g/dL (ref 30.0–36.0)
MCV: 100 fL (ref 78.0–100.0)
Platelets: 324 10*3/uL (ref 150–400)
RBC: 2.51 MIL/uL — ABNORMAL LOW (ref 3.87–5.11)
RDW: 17.7 % — ABNORMAL HIGH (ref 11.5–15.5)
WBC: 12.6 10*3/uL — ABNORMAL HIGH (ref 4.0–10.5)

## 2017-01-14 LAB — RENAL FUNCTION PANEL
ANION GAP: 13 (ref 5–15)
Albumin: 2 g/dL — ABNORMAL LOW (ref 3.5–5.0)
BUN: 33 mg/dL — ABNORMAL HIGH (ref 6–20)
CALCIUM: 9.3 mg/dL (ref 8.9–10.3)
CHLORIDE: 92 mmol/L — AB (ref 101–111)
CO2: 26 mmol/L (ref 22–32)
Creatinine, Ser: 6.21 mg/dL — ABNORMAL HIGH (ref 0.44–1.00)
GFR calc non Af Amer: 5 mL/min — ABNORMAL LOW (ref >60)
GFR, EST AFRICAN AMERICAN: 6 mL/min — AB (ref >60)
GLUCOSE: 291 mg/dL — AB (ref 65–99)
POTASSIUM: 4.9 mmol/L (ref 3.5–5.1)
Phosphorus: 4.3 mg/dL (ref 2.5–4.6)
SODIUM: 131 mmol/L — AB (ref 135–145)

## 2017-01-14 LAB — ABO/RH: ABO/RH(D): O POS

## 2017-01-14 LAB — GLUCOSE, CAPILLARY
GLUCOSE-CAPILLARY: 275 mg/dL — AB (ref 65–99)
Glucose-Capillary: 255 mg/dL — ABNORMAL HIGH (ref 65–99)
Glucose-Capillary: 178 mg/dL — ABNORMAL HIGH (ref 65–99)

## 2017-01-14 LAB — PREPARE RBC (CROSSMATCH)

## 2017-01-14 MED ORDER — HEPARIN SODIUM (PORCINE) 1000 UNIT/ML DIALYSIS: 1000.0000 [IU] | INTRAMUSCULAR | Status: DC | PRN

## 2017-01-14 MED ORDER — SODIUM CHLORIDE 0.9 % IV SOLN: 100.0000 mL | INTRAVENOUS | Status: DC | PRN

## 2017-01-14 MED ORDER — DIPHENHYDRAMINE HCL 50 MG/ML IJ SOLN
12.5000 mg | Freq: Once | INTRAMUSCULAR | Status: AC
  Administered 2017-01-14: 12.5 mg via INTRAVENOUS

## 2017-01-14 MED ORDER — SODIUM CHLORIDE 0.9 % IV SOLN
Freq: Once | INTRAVENOUS | Status: AC
  Administered 2017-01-15: 19:00:00 via INTRAVENOUS

## 2017-01-14 MED ORDER — LIDOCAINE HCL (PF) 1 % IJ SOLN: 5.0000 mL | INTRAMUSCULAR | Status: DC | PRN

## 2017-01-14 MED ORDER — LINAGLIPTIN 5 MG PO TABS
5.0000 mg | ORAL_TABLET | Freq: Every day | ORAL | Status: DC
  Administered 2017-01-14 – 2017-01-16 (×3): 5 mg via ORAL
  Filled 2017-01-14 (×3): qty 1

## 2017-01-14 MED ORDER — LIDOCAINE-PRILOCAINE 2.5-2.5 % EX CREA: 1.0000 "application " | TOPICAL_CREAM | CUTANEOUS | Status: DC | PRN

## 2017-01-14 MED ORDER — HEPARIN SODIUM (PORCINE) 1000 UNIT/ML DIALYSIS
2000.0000 [IU] | Freq: Once | INTRAMUSCULAR | Status: DC
Start: 2017-01-15 — End: 2017-01-14

## 2017-01-14 MED ORDER — LEVOFLOXACIN 500 MG PO TABS
500.0000 mg | ORAL_TABLET | ORAL | Status: AC
  Administered 2017-01-14: 500 mg via ORAL
  Filled 2017-01-14 (×2): qty 1

## 2017-01-14 MED ORDER — ALTEPLASE 2 MG IJ SOLR: 2.0000 mg | Freq: Once | INTRAMUSCULAR | Status: DC | PRN

## 2017-01-14 MED ORDER — HEPARIN SODIUM (PORCINE) 1000 UNIT/ML IJ SOLN
2000.0000 [IU] | Freq: Once | INTRAMUSCULAR | Status: AC
  Administered 2017-01-14: 2000 [IU] via INTRAVENOUS

## 2017-01-14 MED ORDER — DIPHENHYDRAMINE HCL 50 MG/ML IJ SOLN
INTRAMUSCULAR | Status: AC
  Filled 2017-01-14: qty 1

## 2017-01-14 MED ORDER — PENTAFLUOROPROP-TETRAFLUOROETH EX AERO: 1.0000 "application " | INHALATION_SPRAY | CUTANEOUS | Status: DC | PRN

## 2017-01-14 MED ORDER — OXYCODONE HCL 20 MG/ML PO CONC
5.0000 mg | ORAL | Status: DC | PRN
  Administered 2017-01-14 – 2017-01-15 (×3): 5 mg via ORAL
  Filled 2017-01-14 (×3): qty 1

## 2017-01-14 MED ORDER — DOXERCALCIFEROL 4 MCG/2ML IV SOLN
INTRAVENOUS | Status: AC
  Filled 2017-01-14: qty 2

## 2017-01-14 NOTE — Progress Notes (Signed)
Family Medicine Teaching Service Daily Progress Note Intern Pager: [phone]  Patient name: Claudia Ridgesddie R Wigfall Medical record number: [id number] Date of birth: 03/15/1927 Age: 83 y.o. Gender: female  Primary Care Provider: Araceli Boucheumley, [place] N, DO Consultants: Renal Code Status: Full  Pt Overview and Major Events to Date:  5/13 admit to FPTS  Assessment and Plan: Claudia Ridgesddie R Rich is a 83 y.o. female presenting with SOB, AMS . PMH is significant for ESRD on HD, dementia, CAD, T2DM, asthma.  #SOB with Hypoxia secondary to Pneumonia Patient is day #8 s/p thoracentesis by pulmonology with 300 ml cloudy pleural fluid obtained in the setting of a RML pneumonia. Loculated pocket of fluid unable to be removed. overnight patient was 4L [place] with O2 sat of 100%. Will consult IR this morning to evaluate possible therapeutic thoracentesis and possible palliative pleurX cath placement. --Continue telemetry --Continue Levaquin 750 mg loading dose (5/20) DAY 1 and 500 mg q48h Start 5/22. Last dose today 5/24 --O2 prn saturations <92% --Consult IR for therapeutic thoracentesis and pleurX placement --Continue sublingual morphine per palliative recs for air hunger --Consider additional goals of care discussions with family  #AMS, improving Patient's mental status have been improving in the past few days likely combination of baseline dementia and delirium. She is much improved and almost back to baseline. --Will continue to monitor --Reorient as needed --Delirium precautions, family at bedside, open blinds  #HTN  Patient is currently on metoprolol 25 mg and norvasc 10 mg. Mild tachycardia this morning. --Continue amlodipine 10 mg daily --Continue metoprolol 25 mg bid   #ESRD on HD Patient is on Tu,Th, Sat schedule. Patient has tolerated all her HD sessions without complications. Good volume out, patient also receiving Darbepoetin alfa and Hemoglobin has been stable.  --Nephrology following, appreciate  recs  #DM2 11/2016 A1c 7.9. Patient's po intake continue to improve as seen with increase in short acting insulin use. --sSSI while hospitalized --BG checks qAC/hs  #Pressure wound Patient has stage II pressure ulcer wound on the right buttock. Appears dry and is being assess regularly by nursing. Patient endorsed some discomfort yesterday and requested pain meds after tylenol did not provide any relief. --Would consult wound care as needed --Will follow up on pain   #GERD w/ h/o peptic ulcer --Continue pantoprazole 40 mg daily    #Oral Thrush --Continue Nystatin, will reassess  FEN/GI: Dysphagia 2 Fine chop, meds whole w/ pudding, HH/Carb mod renal diet, Protonix Prophylaxis: Lovenox subq renal dosed   Disposition: Continued inpatient management of respiratory status  Subjective:  Patient is a little more confused this morning but endorse improvement in breathing.   Objective: Temp:  [98 F (36.7 C)-98.5 F (36.9 C)] 98 F (36.7 C) (05/24 0600) Pulse Rate:  [69-104] 103 (05/24 0600) Resp:  [13-28] 23 (05/24 0600) BP: (105-126)/(47-74) 105/49 (05/24 0600) SpO2:  [90 %-100 %] 100 % (05/24 0600) Weight:  [146 lb 9.7 oz (66.5 kg)] 146 lb 9.7 oz (66.5 kg) (05/24 0600)   Physical Exam:  General: patient appears uncomfortable, breathing comfortably Cardiovascular: RRR, no m/r/g Respiratory: minimal air movement throughout right lung fields, moving air clearly upper and lower lobes on the left. Abdomen: soft and nontender, nondistended Extremities: no LE edema Neuro: AOx3, no focal deficits  Laboratory:  Recent Labs Lab 01/11/17 0420 01/12/17 0432 01/13/17 0825  WBC 13.0* 14.5* 11.8*  HGB 8.5* 8.0* 8.2*  HCT 27.1* 25.5* 26.9*  PLT 295 323 342    Recent Labs Lab 01/11/17 0420 01/12/17 0432 01/13/17  0825  NA 132* 131* 133*  K 3.7 3.9 4.7  CL 93* 91* 95*  CO2 27 26 25   BUN 31* 45* 21*  CREATININE 5.07* 6.73* 4.52*  CALCIUM 9.2 9.3 9.2  GLUCOSE 245* 219*  256*    Imaging/Diagnostic Tests: Dg Chest 2 View: 01/03/2017 FINDINGS: Moderate right pleural effusion. Associated right lower lobe opacity, likely compressive atelectasis. No frank interstitial edema.  No pneumothorax. The heart is normal in size. Vascular stent overlying the mediastinum. Old left rib fracture deformities.  IMPRESSION: Moderate right pleural effusion. Associated right lower lobe opacity, likely compressive atelectasis.   Ct Angio Chest Pe W Or Wo Contrast  01/04/2017 IMPRESSION: Large right pleural effusion layering dependently with subtotal collapse of the right lung. Patchy pneumonia left lower lobe.  Left lower lobe bronchiectasis. 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 Aortic and coronary artery calcification. Multiple venous collaterals suggest stenosis of the the SVC at the SVC innominate junction. Innominate stent in place. Uncertain if this is patent.    Lovena Neighbours, MD 01/14/2017, 6:48 AM PGY-1, [facility] Family Medicine FPTS Intern pager: [phone], text pages welcome

## 2017-01-14 NOTE — Progress Notes (Signed)
CS[facility] following [facility].  [employer] Medicare auth request restarted at 10:45am- per MD likely ready for DC Friday vs Saturday  Burna SisJenna H. Macarius Ruark, LCSW Clinical Social Worker [phone]

## 2017-01-14 NOTE — Progress Notes (Signed)
Name: Clent Ridgesddie R Strausbaugh MRN: [id number] DOB: 12-Apr-1927    ADMISSION DATE:  01/03/2017 CONSULTATION DATE: 01/05/2017  REFERRING MD : Dr. Jennette KettleNeal  CHIEF COMPLAINT:  Dyspnea, Large R pleural effusion  BRIEF PATIENT DESCRIPTION: 83 y/o F admitted 5/13 with SOB / AMS.  PMH of ESRD on HD TTS, COPD, PVD, dementia, stroke, CAD, T2DM, and asthma. CTA chest negative for PE on 5/14 but showed LLL infiltrate and large right pleural effusion with RML/RLL collapse.  S/P R thora 5/15 w/ 300 ml of cloudy yellow pleural fluid.  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  Right pleural fluid was exudative by LDH (ratio 2) and cytology was inflammatory with no indications of cancer.   SUBJECTIVE:  On HD   VITAL SIGNS: Temp:  [97 F (36.1 C)-98.5 F (36.9 C)] 97.7 F (36.5 C) (05/24 1230) Pulse Rate:  [69-105] 101 (05/24 1300) Resp:  [13-28] 20 (05/24 1300) BP: (105-147)/(46-100) 123/80 (05/24 1300) SpO2:  [90 %-100 %] 100 % (05/24 1300) Weight:  [66.5 kg (146 lb 9.7 oz)-67.5 kg (148 lb 13 oz)] 67.5 kg (148 lb 13 oz) (05/24 0930)  PHYSICAL EXAMINATION: General: on HD, no distress Neuro: awake, alert, slight confusion, moving all ext equally HEENT: jvd up PULM: reduced BS rt, no egophany, coarse CV: s1 s2 RRt GI: soft, low ,muscl emass, no r/g Extremities:  No edema    Recent Labs Lab 01/12/17 0432 01/13/17 0825 01/14/17 0645  NA 131* 133* 131*  K 3.9 4.7 4.9  CL 91* 95* 92*  CO2 26 25 26   BUN 45* 21* 33*  CREATININE 6.73* 4.52* 6.21*  GLUCOSE 219* 256* 291*    Recent Labs Lab 01/12/17 0432 01/13/17 0825 01/14/17 0645  HGB 8.0* 8.2* 7.6*  HCT 25.5* 26.9* 25.1*  WBC 14.5* 11.8* 12.6*  PLT 323 342 324   Dg Chest Port 1 View  Result Date: 01/12/2017 CLINICAL  DATA:  Dyspnea EXAM: PORTABLE CHEST 1 VIEW COMPARISON:  01/10/2017 chest radiograph. FINDINGS: Right rotated chest radiograph. Vascular stent in the upper mediastinum appears stable. Stable cardiomediastinal silhouette with normal heart size. No pneumothorax. Large right pleural effusion is stable. Trace left pleural effusion is stable. Complete opacification of the mid to lower right lung, unchanged. Mild patchy left lung base opacity is unchanged. IMPRESSION: Stable chest radiograph with large right and trace left pleural effusions, complete opacification of the mid to lower right lung and mild patchy left lung base opacity. Electronically Signed   By: Delbert PhenixJason A Poff M.D.   On: 01/12/2017 17:35   SIGNIFICANT EVENTS  5/13  Admit to FPTS for SOB, AMS 5/14  CTA negative for PE, + for large right pleural effusion and LLL infiltrate  STUDIES:  CTA Chest 5/14 >> Negative for PE, Large right pleural effusion layering dependently with complete collapse of the right lower lobe, and near complete collapse of the right middle lobe. Dependent atelectasis in the upper lobe. Patchy pneumonia left lower lobe.  Left lower lobe bronchiectasis. Pleural Cytology 5/15 >> inflammatory CT Head 5/16 >>  negative  CULTURES: BCx2 5/14 >>  RVP 5/14 >> negative R pleural fluid 5/15 >> LDH ratio 2, ngtd, PMN cells    ASSESSMENT / PLAN:  83 year old female with ESRD who presents to the hospital with AMS  and SOB. CT of the chest showed LLL infiltrate and large right pleural effusion with RML/RLL collapse s/p thora on 5/15 that was exudative by LDH ratio of 2.  Cytolgoy with inflammatory cells, no cancer.    Pleural effusion: Thora performed 5/15, exudative with inflammatory cells.  This is likely an older fibrothorax and nothing further to be done.  Patient is not a candidate for VATS or CT w/lytic therapy. - Could ask IR to evaluate again for pleurx catheter for palliation efforts - Support symptomatically - no distress  today, all CT and pcxr reviewed by me, with increased loculated effusion on 5/22 -can consider repeat pcxr - I am concerned that if we place any type of chest tube in setting loculations and the chronicty at the point, we may not yiled much relief of volume off chest ( as thora ended up same) - would prefer pall care to discuss further her wishes ( i would be amenable to placing a small bore chest tube 20 fr)  -tube placed may not change her symptoms or outcome  -o2 support -volume to even on hd -given loculations would NOT do pleurex or pigtail chest tube  Acute Hypoxic Respiratory Failure - multifactorial in the setting of LLL CAP, bronchiectasis, underlying COPD, pulmonary HTN and large right pleural effusion.  ? Intermittent mucous plugging - continue to titrate for Sats of 88-92 %  - continue pulmonary hygiene with flutter valve - home spiriva and dulera (on advair at home)   Pulmonary HTN: 36 mmHg 5 years ago - Last TTE 05/2012, doubt she would be a candidate for therapy given other comorbidities  Please cal back if chest tube desired  Mcarthur Rossetti. Tyson Alias, MD, FACP Pgr: [phone] [facility]

## 2017-01-14 NOTE — Progress Notes (Addendum)
Daily Progress Note   Patient Name: Claudia Rich       Date: 01/14/2017 DOB: 07/25/1927  Age: 83 y.o. MRN#: [id number] Attending Physician: Claudia Ramp, MD Primary Care Physician: Claudia Bouche, DO Admit Date: 01/03/2017  Reason for Consultation/Follow-up: Establishing goals of care and Non pain symptom management  Subjective: Noted patient itching per nephrology note- will change morphine to oxycodone for SOB.  Spoke with Claudia Rich. Discussed GOC. Noted loculated effusions, worsening clinical status, end of antibiotic regimen.  Claudia Rich would like to further discuss patient's medical options with attending MD. She is open to transitioning to Hospice if there are not reasonable expectations that further antibiotic therapy or aggressive therapies will improve patient's status to full function, however, she would like to hear this from the patient's attending MD.  Claudia Rich expressed that she does not want to prolong patient's suffering if poor likelihood of recovery. Discussed option pleurex cath, thoracentesis vs symptom control with medication. Claudia Rich does not want invasive procedure that could possibly cause more suffering.   For now- continue with medication until Claudia Rich Claudia Rich) can discuss further medical therapy with attending MD.  Review of Systems  Unable to perform ROS: Dementia    Length of Stay: 11  Current Medications: Scheduled Meds:  . amLODipine  10 mg Oral QPM  . aspirin  81 mg Oral q morning - 10a  . atorvastatin  80 mg Oral QHS  . darbepoetin (ARANESP) injection - DIALYSIS  100 mcg Intravenous Q Tue-HD  . diphenhydrAMINE      . doxercalciferol      . doxercalciferol  2 mcg Intravenous Q T,Th,Sa-HD  . feeding supplement (PRO-STAT SUGAR FREE 64)  30 mL Oral BID  . ferric  citrate  420 mg Oral TID WC  . [START ON 01/15/2017] heparin  2,000 Units Dialysis Once in dialysis  . levofloxacin  500 mg Oral Q48H  . linagliptin  5 mg Oral Daily  . loratadine  10 mg Oral Daily  . mouth rinse  15 mL Mouth Rinse BID  . metoprolol tartrate  25 mg Oral BID  . mometasone-formoterol  2 puff Inhalation BID  . multivitamin  1 tablet Oral QHS  . nystatin  5 mL Oral QID  . pantoprazole  40 mg Oral Daily  . sodium chloride flush  3  mL Intravenous Q12H  . tiotropium  18 mcg Inhalation Daily    Continuous Infusions: . sodium chloride    . sodium chloride    . sodium chloride      PRN Meds: sodium chloride, sodium chloride, acetaminophen **OR** acetaminophen, albuterol, alteplase, alum & mag hydroxide-simeth, docusate sodium, heparin, lidocaine (PF), lidocaine-prilocaine, oxyCODONE, pentafluoroprop-tetrafluoroeth, polyethylene glycol   Nursing note and vitals reviewed.           Vital Signs: BP (!) 137/100 (BP Location: Right Arm)   Pulse 95   Temp 97.7 F (36.5 C) (Oral) Comment: post PRBC VS check  Resp (!) 21   Ht 5\' 4"  (1.626 m)   Wt 67.5 kg (148 lb 13 oz) Comment: standing  SpO2 100%   BMI 25.54 kg/m  SpO2: SpO2: 100 % O2 Device: O2 Device: Nasal Cannula O2 Flow Rate: O2 Flow Rate (L/min): 5 L/min  Intake/output summary:   Intake/Output Summary (Last 24 hours) at 01/14/17 1245 Last data filed at 01/14/17 1228  Gross per 24 hour  Intake              573 ml  Output                0 ml  Net              573 ml   LBM: Last BM Date: 01/12/17 Baseline Weight: Weight: 66.7 kg (147 lb) Most recent weight: Weight: 67.5 kg (148 lb 13 oz) (standing)       Palliative Assessment/Data: PPS: 30%   Flowsheet Rows     Most Recent Value  Intake Tab  Referral Department  Hospitalist  Unit at Time of Referral  Med/Surg Unit  Palliative Care Primary Diagnosis  Pulmonary  Date Notified  01/06/17  Palliative Care Type  New Palliative care  Reason for referral   Clarify Goals of Care  Date of Admission  01/03/17  Date first seen by Palliative Care  01/06/17  # of days Palliative referral response time  0 Day(s)  # of days IP prior to Palliative referral  3  Clinical Assessment  Palliative Performance Rich Score  30%  Psychosocial & Spiritual Assessment  Palliative Care Outcomes      Patient Active Problem List   Diagnosis Date Noted  . Hypoxia   . Adult failure to thrive   . Weakness   . Altered mental status   . Palliative care by specialist   . DNR (do not resuscitate)   . S/P thoracentesis   . Pressure injury of skin 01/04/2017  . ESRD on hemodialysis (HCC)   . HCAP (healthcare-associated pneumonia) 01/03/2017  . Pain of fifth toe 12/26/2016  . Chronic left hip pain 02/11/2016  . Trochanteric bursitis of left hip 01/17/2016  . Cutaneous horn 01/17/2016  . Balance problem 02/27/2015  . Preventative health care 12/14/2014  . DM2 (diabetes mellitus, type 2) (HCC) 12/14/2014  . Back pain 12/14/2014  . Hearing difficulty 10/10/2013  . Chronic restrictive lung disease 01/23/2013  . Pancreatic cyst 01/23/2013  . Dementia 07/02/2012  . CAD (coronary artery disease) 05/23/2012  . Pleural effusion 01/05/2011  . ESRD on dialysis (HCC) 05/07/2010  . Macrocytic anemia 03/20/2009  . Essential hypertension, benign 06/10/2007  . Hyperlipidemia 10/29/2006  . G E R D 10/29/2006    Palliative Care Assessment & Plan   Patient Profile:  83 y.o. female   admitted on 01/03/2017 with altered mental status from home where she lives alone.  PMH significant for ESRD on HD, COPD, PVD, mild dementia, DM.  Workup revealed extensive pneumonia with pleural effusions requiring thoracentesis. She has continued to be SOB and has not clinically improved. She has worsening loculated pleural effusions, pleurex cath is not recommended per pulmonary critical care. Palliative medicine consulted for GOC.  Assessment/Recommendations/Plan   Oxycodone  concentrated solution 5mg  every 1 hr pr SOB, agitation, anxiety  GOC in process with Claudia Rich- Claudia Rich who lives in [place]- Claudia Rich is considering transition to residential Hospice  Requested Family medicine service call Claudia Rich and discuss patient medical status per Claudia Rich request  Goals of Care and Additional Recommendations:  Limitations on Scope of Treatment: Full Scope Treatment  Code Status:  DNR  Prognosis:   Unable to determine  Discharge Planning:  To Be Determined  Care plan was discussed with patient's RNSharia Reeve, patient's attending MD, Tresa Endo and Pam in Interventional radiology.  Thank you for allowing the Palliative Medicine Team to assist in the care of this patient.   Time In: 1000 1400 Time Out: 1100 1445 Total Time 95 minutes Prolonged Time Billed Yes      Greater than 50%  of this time was spent counseling and coordinating care related to the above assessment and plan.  Ocie Bob, AGNP-C Palliative Medicine   Please contact Palliative Medicine Team phone at [phone] for questions and concerns.

## 2017-01-14 NOTE — Progress Notes (Signed)
IR is aware of initial request for pleurX catheter.  However, after discussion with palliative care today, the family has decided they do not want to proceed with a PleurX catheter.  No intervention from IR service at this time.  Claudia Rich E 3:59 PM 01/14/2017

## 2017-01-14 NOTE — Progress Notes (Signed)
[facility] Progress Note   Subjective:   PATIENT SEEN IN HD AT [facility] TO GET 1 UNIT PRBC'S FOR HB 7.6 L AVG 400 NO ACCESS ISSUES MAIN C/O IS ITCHING - We are giving some IV benadryl for this  C/o itching today Has been getting oral morphine frequently for anxiety and air hungry - ? If this is why itching Primary team/pulm pursuing possibility of Hospice involvement   Vitals:   01/14/17 0940 01/14/17 1000 01/14/17 1030 01/14/17 1100  BP: (!) 125/59 (!) 115/53 (!) 107/56 (!) 122/55  Pulse: 97 96 95 94  Resp: 15 20 (!) 26 (!) 21  Temp:      TempSrc:      SpO2: 97% 100% 100% 100%  Weight:      Height:       Exam: VS as noted Less air hungry but scratching and restless/anxious Hardly any BS heard in R chest Regular/tachy low 100's Abd soft no ascites Ext no edema Nonfocal Can't assess orientation d/t her anxiety  Inpatient medications: . amLODipine  10 mg Oral QPM  . aspirin  81 mg Oral q morning - 10a  . atorvastatin  80 mg Oral QHS  . darbepoetin (ARANESP) injection - DIALYSIS  100 mcg Intravenous Q Tue-HD  . diphenhydrAMINE  12.5 mg Intravenous Once in dialysis  . doxercalciferol      . doxercalciferol  2 mcg Intravenous Q T,Th,Sa-HD  . feeding supplement (PRO-STAT SUGAR FREE 64)  30 mL Oral BID  . ferric citrate  420 mg Oral TID WC  . [START ON 01/15/2017] heparin  2,000 Units Dialysis Once in dialysis  . levofloxacin  500 mg Oral Q48H  . linagliptin  5 mg Oral Daily  . loratadine  10 mg Oral Daily  . mouth rinse  15 mL Mouth Rinse BID  . metoprolol tartrate  25 mg Oral BID  . mometasone-formoterol  2 puff Inhalation BID  . multivitamin  1 tablet Oral QHS  . nystatin  5 mL Oral QID  . pantoprazole  40 mg Oral Daily  . sodium chloride flush  3 mL Intravenous Q12H  . tiotropium  18 mcg Inhalation Daily   prn medications sodium chloride, sodium chloride, acetaminophen **OR** acetaminophen, albuterol, alteplase, alum & mag hydroxide-simeth,  docusate sodium, heparin, lidocaine (PF), lidocaine-prilocaine, morphine CONCENTRATE, pentafluoroprop-tetrafluoroeth, polyethylene glycol   Recent Labs Lab 01/12/17 0432 01/13/17 0825 01/14/17 0645  NA 131* 133* 131*  K 3.9 4.7 4.9  CL 91* 95* 92*  CO2 26 25 26   GLUCOSE 219* 256* 291*  BUN 45* 21* 33*  CREATININE 6.73* 4.52* 6.21*  CALCIUM 9.3 9.2 9.3  PHOS  --   --  4.3    Recent Labs Lab 01/14/17 0645  ALBUMIN 2.0*    Recent Labs Lab 01/12/17 0432 01/13/17 0825 01/14/17 0645  WBC 14.5* 11.8* 12.6*  HGB 8.0* 8.2* 7.6*  HCT 25.5* 26.9* 25.1*  MCV 99.6 100.7* 100.0  PLT 323 342 324    Dg Chest Port 1 View  Result Date: 01/12/2017 CLINICAL DATA:  Dyspnea EXAM: PORTABLE CHEST 1 VIEW COMPARISON:  01/10/2017 chest radiograph. FINDINGS: Right rotated chest radiograph. Vascular stent in the upper mediastinum appears stable. Stable cardiomediastinal silhouette with normal heart size. No pneumothorax. Large right pleural effusion is stable. Trace left pleural effusion is stable. Complete opacification of the mid to lower right lung, unchanged. Mild patchy left lung base opacity is unchanged. IMPRESSION: Stable chest radiograph with large right and trace left pleural effusions,  complete opacification of the mid to lower right lung and mild patchy left lung base opacity. Electronically Signed   By: Delbert PhenixJason A Poff M.D.   On: 01/12/2017 17:35   [facility]  [facility],  BFR 400, DFR A1.5,  2K/2.25Ca,  Profile 4,  EDW 67.5kg AVG - Heparin 2000 unit bolus q HD - Hectoral 2mcg IV q HD - Mircera 150mcg q 2 weeks (just reordered, last given 11/24/16)  Assessment: 1. Hypoxemia/ large RLL and RML PNA/ exudative R pleural effusion - s/p IV abx ->levaquin, not surg candidate. No improvement on exam (clinically worse). Thoracentesis 01/05/17 of 300 cc. By report some loculated/not accessible to tap.5/22 progressively more dyspneic, CXR near opacification of R chest. Current plan is to  pursue possible Pleurx tube in IR. 2. ESRD HD TTS. At [facility]. No edema. 3. Dementia - Was living alone PTA. SNF at d/c 4. DM on po meds at home, SSI here 5. Anemia of CKD - started ESA here (darbe 100 5/22). Hb 7.6. Transfuse 1 unit on HD today 6. DNR 7. Diffuse itching - I worry that this is related to morphine - which is clearly helping her anxiety/air hungry but the itching right now is severe. Giving IV benadryl on HD. 8. Disposition - SNF at discharge. DNR. Current plan is to continue HD. Primary service entertaining getting Hospice involved (but want her to continue dialysis for now)  Camille Balynthia Kelie Gainey, MD [facility] [phone] Pager 01/14/2017, 11:34 AM

## 2017-01-14 NOTE — Progress Notes (Signed)
HD tx completed @ 1325 w/o problem, UF goal met (keep even per MD orders to get to [facility] and pt was at [facility] upon pre assess wt), blood rinsed back, pt received 1 unit PRBC on HD and that too was completed w/o s/s of reaction, report called to Adline Potter, RN

## 2017-01-14 NOTE — Progress Notes (Signed)
OT Cancellation Note  Patient Details Name: Clent Ridgesddie R Adamcik MRN: [id number] DOB: [birth year]   Cancelled Treatment:    Reason Eval/Treat Not Completed: Patient at procedure or test/ unavailable (HD)  Evern BioMayberry, Aavya Shafer Lynn 01/14/2017, 10:23 AM

## 2017-01-14 NOTE — Progress Notes (Signed)
HD tx initiated via 15G x2 w/o problem, VSS, sample send for T&S to transfuse one unit PRBC,  will cont to monitor while on HD

## 2017-01-14 NOTE — Progress Notes (Addendum)
No charge note:  Spoke again with [name]- patient's HCPOA-   Again discussed patient's overall illness trajectory, poor prognosis. She declines pleurex cath or thoracentesis for now. Notified IR and attending physician. She is considering full transition to comfort measures and Hospice. Will notify healthcare team of her decision tonight or tomorrow.   Ocie BobKasie Mahan, AGNP-C Palliative Medicine  Please call Palliative Medicine team phone with any questions [phone]. For individual providers please see AMION.

## 2017-01-14 NOTE — Progress Notes (Signed)
The patient came back from HD in a Vent Trigeminy on telemetry. EKG was taken to confirm. Family Medicine on call notified. I will continue to monitor the patient closely.  Sheppard Evensina Ainara Eldridge RN

## 2017-01-15 DIAGNOSIS — J181 Lobar pneumonia, unspecified organism: Secondary | ICD-10-CM

## 2017-01-15 DIAGNOSIS — Z7189 Other specified counseling: Secondary | ICD-10-CM

## 2017-01-15 DIAGNOSIS — Z515 Encounter for palliative care: Secondary | ICD-10-CM

## 2017-01-15 DIAGNOSIS — R0603 Acute respiratory distress: Secondary | ICD-10-CM

## 2017-01-15 DIAGNOSIS — J189 Pneumonia, unspecified organism: Secondary | ICD-10-CM

## 2017-01-15 LAB — CBC
HCT: 29.9 % — ABNORMAL LOW (ref 36.0–46.0)
Hemoglobin: 9.4 g/dL — ABNORMAL LOW (ref 12.0–15.0)
MCH: 30.9 pg (ref 26.0–34.0)
MCHC: 31.4 g/dL (ref 30.0–36.0)
MCV: 98.4 fL (ref 78.0–100.0)
PLATELETS: 338 10*3/uL (ref 150–400)
RBC: 3.04 MIL/uL — ABNORMAL LOW (ref 3.87–5.11)
RDW: 18.1 % — AB (ref 11.5–15.5)
WBC: 13.6 10*3/uL — AB (ref 4.0–10.5)

## 2017-01-15 LAB — TYPE AND SCREEN
ABO/RH(D): O POS
Antibody Screen: NEGATIVE
Unit division: 0

## 2017-01-15 LAB — GLUCOSE, CAPILLARY
GLUCOSE-CAPILLARY: 290 mg/dL — AB (ref 65–99)
Glucose-Capillary: 256 mg/dL — ABNORMAL HIGH (ref 65–99)

## 2017-01-15 LAB — BASIC METABOLIC PANEL
ANION GAP: 12 (ref 5–15)
BUN: 14 mg/dL (ref 6–20)
CALCIUM: 9.2 mg/dL (ref 8.9–10.3)
CO2: 28 mmol/L (ref 22–32)
Chloride: 93 mmol/L — ABNORMAL LOW (ref 101–111)
Creatinine, Ser: 3.88 mg/dL — ABNORMAL HIGH (ref 0.44–1.00)
GFR, EST AFRICAN AMERICAN: 11 mL/min — AB (ref >60)
GFR, EST NON AFRICAN AMERICAN: 10 mL/min — AB (ref >60)
GLUCOSE: 269 mg/dL — AB (ref 65–99)
Potassium: 4.2 mmol/L (ref 3.5–5.1)
Sodium: 133 mmol/L — ABNORMAL LOW (ref 135–145)

## 2017-01-15 LAB — BPAM RBC
Blood Product Expiration Date: 201806232359
ISSUE DATE / TIME: 201805241144
Unit Type and Rh: 5100

## 2017-01-15 MED ORDER — GLYCOPYRROLATE 0.2 MG/ML IJ SOLN
0.2000 mg | INTRAMUSCULAR | Status: DC | PRN
  Administered 2017-01-16: 0.2 mg via INTRAVENOUS

## 2017-01-15 MED ORDER — LORAZEPAM 2 MG/ML IJ SOLN
1.0000 mg | INTRAMUSCULAR | Status: DC | PRN
  Administered 2017-01-15: 1 mg via INTRAVENOUS
  Filled 2017-01-15: qty 1

## 2017-01-15 MED ORDER — GLYCOPYRROLATE 1 MG PO TABS: 1.0000 mg | ORAL_TABLET | ORAL | Status: DC | PRN

## 2017-01-15 MED ORDER — HYDROCORTISONE 1 % EX CREA
TOPICAL_CREAM | Freq: Four times a day (QID) | CUTANEOUS | Status: DC | PRN
  Administered 2017-01-15: 18:00:00 via TOPICAL
  Filled 2017-01-15: qty 28

## 2017-01-15 MED ORDER — HALOPERIDOL 0.5 MG PO TABS
0.5000 mg | ORAL_TABLET | ORAL | Status: DC | PRN
  Filled 2017-01-15: qty 1

## 2017-01-15 MED ORDER — LORAZEPAM 1 MG PO TABS: 1.0000 mg | ORAL_TABLET | ORAL | Status: DC | PRN

## 2017-01-15 MED ORDER — HALOPERIDOL LACTATE 5 MG/ML IJ SOLN
0.5000 mg | INTRAMUSCULAR | Status: DC | PRN
  Administered 2017-01-15: 0.5 mg via INTRAVENOUS
  Filled 2017-01-15: qty 1

## 2017-01-15 MED ORDER — LORAZEPAM 2 MG/ML PO CONC: 1.0000 mg | ORAL | Status: DC | PRN

## 2017-01-15 MED ORDER — FENTANYL BOLUS VIA INFUSION
20.0000 ug | INTRAVENOUS | Status: DC | PRN
  Administered 2017-01-15 – 2017-01-16 (×3): 20 ug via INTRAVENOUS
  Filled 2017-01-15: qty 20

## 2017-01-15 MED ORDER — GLYCOPYRROLATE 0.2 MG/ML IJ SOLN
0.2000 mg | INTRAMUSCULAR | Status: DC | PRN
  Filled 2017-01-15: qty 1

## 2017-01-15 MED ORDER — FENTANYL 2500MCG IN NS 250ML (10MCG/ML) PREMIX INFUSION
15.0000 ug/h | INTRAVENOUS | Status: DC
  Administered 2017-01-15: 15 ug/h via INTRAVENOUS
  Filled 2017-01-15: qty 250

## 2017-01-15 MED ORDER — HALOPERIDOL LACTATE 2 MG/ML PO CONC
0.5000 mg | ORAL | Status: DC | PRN
  Filled 2017-01-15: qty 0.3

## 2017-01-15 MED ORDER — SODIUM CHLORIDE 0.9 % IV SOLN
15.0000 ug/h | INTRAVENOUS | Status: DC
  Filled 2017-01-15: qty 50

## 2017-01-15 NOTE — Progress Notes (Signed)
Family Medicine Teaching Service Daily Progress Note Intern Pager: [phone]  Patient name: Claudia Rich Medical record number: [id number] Date of birth: 08-11-1927 Age: 83 y.o. Gender: female  Primary Care Provider: Araceli Bouche, DO Consultants: Renal Code Status: Full  Pt Overview and Major Events to Date:  5/13 admit to FPTS  Assessment and Plan: Claudia Rich is a 83 y.o. female presenting with SOB, AMS . PMH is significant for ESRD on HD, dementia, CAD, T2DM, asthma.  #SOB with Hypoxia secondary to Pneumonia Patient is day #9 s/p thoracentesis by pulmonology with 300 ml cloudy pleural fluid obtained in the setting of a RML pneumonia. Loculated pocket of fluid unable to be removed. Overnight patient was 5L [place] with O2 sat in the high 90's in the setting of large pleural effusion. IR was consulted yesterday 5/24 for evaluation for thoracentesis and pleurX, but after discussing with palliative care, family opted out. CCM is also willing to place small bore chest tube.  --Continue telemetry  --Completed course of Levaquin 750 mg loading dose (5/20) DAY 1 and 500 mg q48h Start 5/22. Last dose today 5/24 --O2 prn saturations <92% --Follow up on Palliative care discussion with family on GOC --Discontinue sublingual morphine for severe pruritus --Start patient on Oxycodone concentrated solution 5 mg every 1 hr pr SOB, agitation, anxiety --Continue Dulera and Spiriva   #AMS, improving Patient's mental status have been improving in the past few days likely combination of baseline dementia and delirium. She is much improved and almost back to baseline. --Will continue to monitor --Reorient as needed --Delirium precautions, family at bedside, open blinds  #HTN  Patient is currently on metoprolol 25 mg and norvasc 10 mg. BP this am is 119/57 with mild tachycardia noted this morning. Patient tachycardia was much improved these past few days but seem to have returned. Etiology unclear though  PE was considered even though CTA from earlier in her admission was negative. --Continue amlodipine 10 mg daily --Continue metoprolol 25 mg bid   #ESRD on HD Patient is on Tu,Th, Sat schedule. Patient has tolerated all her HD sessions without complications. Good volume out, patient also receiving Darbepoetin alfa and Hemoglobin has been stable. Patient was transfused 1u of pRBCs at HD for an hemoglobin of 7.6. Patient started on Darbepoetin on 5/22. Patient also given IV benadryl for diffuse itching most likely secondary to morphine. --Follow up on CBC  --Nephrology following, appreciate recs  #DM2 11/2016 A1c 7.9. Patient's po intake continue to improve. Given patient current clinical status and poor prognosis aggressive control of BG might not be the goal with minimal benefits from such plan. Will resume home regimen and follow. --Discontinue sSSI while hospitalized --Restarted Trajenta 5 mg daily --Appreciate Diabetic Coordinator recs  #Pressure wound Patient has stage II pressure ulcer wound on the right buttock. Appears dry and is being assess regularly by nursing. Patient endorsed some discomfort yesterday and requested pain meds after tylenol did not provide any relief. --Would consult wound care as needed --Will follow up on pain   #GERD w/ h/o peptic ulcer --Continue pantoprazole 40 mg daily    #Oral Thrush --Continue Nystatin, will reassess  FEN/GI: Dysphagia 2 Fine chop, meds whole w/ pudding, HH/Carb mod renal diet, Protonix Prophylaxis: Lovenox subq renal dosed   Disposition: Continued inpatient management of respiratory status, discussing goals of care with POA with imminent decision for hospice/comfort care.   Subjective:  Patient is sitting in chair this morning on NRB mask but maintain good  oxygenation. Patient is appropriate and able to answer question. Patient appears a bit weak but stable.  Objective: Temp:  [97 F (36.1 C)-98.8 F (37.1 C)] 98.8 F (37.1  C) (05/25 0417) Pulse Rate:  [71-134] 102 (05/25 0417) Resp:  [13-28] 23 (05/25 0417) BP: (96-147)/(46-100) 119/57 (05/25 0417) SpO2:  [79 %-100 %] 97 % (05/25 0417) Weight:  [123 lb 14.4 oz (56.2 kg)-148 lb 13 oz (67.5 kg)] 123 lb 14.4 oz (56.2 kg) (05/25 0417)   Physical Exam:  General: patient appears uncomfortable, breathing comfortably Cardiovascular: RRR, no m/r/g Respiratory: minimal air movement throughout right lung fields, moving air clearly upper and lower lobes on the left. Abdomen: soft and nontender, nondistended Extremities: no LE edema Neuro: AOx3, no focal deficits  Laboratory:  Recent Labs Lab 01/13/17 0825 01/14/17 0645 01/15/17 0346  WBC 11.8* 12.6* 13.6*  HGB 8.2* 7.6* 9.4*  HCT 26.9* 25.1* 29.9*  PLT 342 324 338    Recent Labs Lab 01/13/17 0825 01/14/17 0645 01/15/17 0346  NA 133* 131* 133*  K 4.7 4.9 4.2  CL 95* 92* 93*  CO2 25 26 28   BUN 21* 33* 14  CREATININE 4.52* 6.21* 3.88*  CALCIUM 9.2 9.3 9.2  GLUCOSE 256* 291* 269*    Imaging/Diagnostic Tests: Dg Chest 2 View: 01/03/2017 FINDINGS: Moderate right pleural effusion. Associated right lower lobe opacity, likely compressive atelectasis. No frank interstitial edema.  No pneumothorax. The heart is normal in size. Vascular stent overlying the mediastinum. Old left rib fracture deformities.  IMPRESSION: Moderate right pleural effusion. Associated right lower lobe opacity, likely compressive atelectasis.   Ct Angio Chest Pe W Or Wo Contrast  01/04/2017 IMPRESSION: Large right pleural effusion layering dependently with subtotal collapse of the right lung. Patchy pneumonia left lower lobe.  Left lower lobe bronchiectasis. 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 Aortic and coronary artery calcification. Multiple venous collaterals suggest stenosis of the the SVC at the SVC innominate junction. Innominate stent in place. Uncertain if this is patent.    Lovena Neighboursiallo, Athea Haley, MD 01/15/2017, 5:14  AM PGY-1, [facility] FPTS Intern pager: [phone], text pages welcome

## 2017-01-15 NOTE — Progress Notes (Signed)
[facility] Brief Note  Notes reviewed [facility]. To transfer there pending bed availability In that vein, since HD no longer appropriate, will sign off Please call if can help  Camille Balynthia Harles Evetts, MD [facility] [phone] Pager 01/15/2017, 6:55 PM

## 2017-01-15 NOTE — Consult Note (Signed)
[facility] [employer]  Received request from [place] for family interest in [facility]. Per Collie Siad, patient is not yet ready for discharge. Chart reviewed and met with patient and niece at bedside and spoke with HCPOA/Oliva by phone. Family aware [place] will follow along and give updates on availability.  Thank you,  Erling Conte, LCSW [phone]

## 2017-01-15 NOTE — Progress Notes (Signed)
Daily Progress Note   Patient Name: Claudia Rich       Date: 01/15/2017 DOB: 05-Jul-1927  Age: 83 y.o. MRN#: [id number] Attending Physician: Dickie La, MD Primary Care Physician: Lorna Few, DO Admit Date: 01/03/2017  Reason for Consultation/Follow-up: Establishing goals of care and Non pain symptom management  Subjective: I met with patient and her family on 2 occasions today. Following my initial encounter, they reported they needed to talk with primary attending team before making any further decisions about her care. I called and discussed with Dr. Ree Kida who met with family and call me with their decision to pursue comfort care with plan for residential hospice for end-of-life care.  I then met again with patient, Hassan Rowan, and her niece/healthcare power of attorney Minette Brine (via phone).  We discussed clinical course as well as wishes moving forward in light of her continued decline.  We discussed difference between a aggressive medical intervention path and a palliative, comfort focused care path.  Values and goals of care important to patient and family were attempted to be elicited.  Concept of Hospice discussed  Family wishes to transition to comfort care with plan for [facility] for end-of-life care.  I reviewed plan in detail with them and provided anticipatory counseling on what to expect next. We spent a significant amount of time reviewing care regimen moving forward as her niece wanted to review details of care plan and approve each proposed intervention.   Review of Systems  Unable to perform ROS: Dementia    Length of Stay: 12  Current Medications: Scheduled Meds:  . amLODipine  10 mg Oral QPM  . feeding supplement (PRO-STAT SUGAR FREE 64)  30 mL Oral BID  .  linagliptin  5 mg Oral Daily  . loratadine  10 mg Oral Daily  . mouth rinse  15 mL Mouth Rinse BID  . metoprolol tartrate  25 mg Oral BID  . mometasone-formoterol  2 puff Inhalation BID  . nystatin  5 mL Oral QID  . pantoprazole  40 mg Oral Daily  . sodium chloride flush  3 mL Intravenous Q12H  . tiotropium  18 mcg Inhalation Daily    Continuous Infusions: . sodium chloride    . fentaNYL 15 mcg/hr (01/15/17 1512)    PRN Meds: acetaminophen **OR** acetaminophen, albuterol,  alum & mag hydroxide-simeth, docusate sodium, fentaNYL, glycopyrrolate **OR** glycopyrrolate **OR** glycopyrrolate, haloperidol **OR** haloperidol **OR** haloperidol lactate, hydrocortisone cream, LORazepam **OR** LORazepam **OR** LORazepam, polyethylene glycol  General: Alert, confused, in moderate respiratory distress. Leaning over in chair with non-rebreather in place. Heart: Regular rate and rhythm. No murmur appreciated. Lungs: Diminished air movement Abdomen: Soft, nontender, nondistended, positive bowel sounds.  Skin: Warm and dry Nursing note and vitals reviewed.           Vital Signs: BP 121/79 (BP Location: Right Arm)   Pulse 100   Temp 98.8 F (37.1 C)   Resp 16   Ht _0  (1.626 m)   Wt 56.2 kg (123 lb 14.4 oz)   SpO2 100%   BMI 21.27 kg/m  SpO2: SpO2: 100 % O2 Device: O2 Device: NRB O2 Flow Rate: O2 Flow Rate (L/min): 15 L/min  Intake/output summary:   Intake/Output Summary (Last 24 hours) at 01/15/17 1818 Last data filed at 01/15/17 0419  Gross per 24 hour  Intake              240 ml  Output                0 ml  Net              240 ml   LBM: Last BM Date: 01/12/17 Baseline Weight: Weight: 66.7 kg (147 lb) Most recent weight: Weight: 56.2 kg (123 lb 14.4 oz)       Palliative Assessment/Data: PPS: 30%   Flowsheet Rows     Most Recent Value  Intake Tab  Referral Department  Hospitalist  Unit at Time of Referral  Med/Surg Unit  Palliative Care Primary Diagnosis  Pulmonary    Date Notified  01/06/17  Palliative Care Type  New Palliative care  Reason for referral  Clarify Goals of Care  Date of Admission  01/03/17  Date first seen by Palliative Care  01/06/17  # of days Palliative referral response time  0 Day(s)  # of days IP prior to Palliative referral  3  Clinical Assessment  Palliative Performance Scale Score  30%  Psychosocial & Spiritual Assessment  Palliative Care Outcomes      Patient Active Problem List   Diagnosis Date Noted  . Community acquired pneumonia of left lower lobe of lung ([place])   . Hypoxia   . Adult failure to thrive   . Weakness   . Altered mental status   . Palliative care by specialist   . DNR (do not resuscitate)   . S/P thoracentesis   . Pressure injury of skin 01/04/2017  . ESRD on hemodialysis ([place])   . HCAP (healthcare-associated pneumonia) 01/03/2017  . Pain of fifth toe 12/26/2016  . Chronic left hip pain 02/11/2016  . Trochanteric bursitis of left hip 01/17/2016  . Cutaneous horn 01/17/2016  . Balance problem 02/27/2015  . Preventative health care 12/14/2014  . DM2 (diabetes mellitus, type 2) ([place]) 12/14/2014  . Back pain 12/14/2014  . Hearing difficulty 10/10/2013  . Chronic restrictive lung disease 01/23/2013  . Pancreatic cyst 01/23/2013  . Dementia 07/02/2012  . CAD (coronary artery disease) 05/23/2012  . Pleural effusion 01/05/2011  . ESRD on dialysis ([place]) 05/07/2010  . Macrocytic anemia 03/20/2009  . Essential hypertension, benign 06/10/2007  . Hyperlipidemia 10/29/2006  . G E R D 10/29/2006    Palliative Care Assessment & Plan   Patient Profile:  83 y.o. female   admitted on 01/03/2017 with altered mental  status from home where she lives alone.  PMH significant for ESRD on HD, COPD, PVD, mild dementia, DM.  Workup revealed extensive pneumonia with pleural effusions requiring thoracentesis. She has continued to be SOB and has not clinically improved. She has worsening loculated pleural effusions,  pleurex cath is not recommended per pulmonary critical care. Palliative medicine consulted for [place].   Assessment/Recommendations/Plan   Family has discussed with primary team and would like to pursue residential hospice for end of life care.  We discussed today creating a plan to ensure her comfort with addition of medications for symptoms as needed while also discontinuing interventions that are not strictly focused on her comfort. This includes discontinuation of cardiac monitoring as well as further dialysis treatments.  I did review her medications in detail with her family per their request and they would like to continue with some of her medications such as blood pressure and diabetes medication as long as she can swallow. I informed them that these would be discontinued on transition to [facility] and they were in agreement with this plan.  She appears acutely SOB breath today on non-rebreather.  Reports itching after starting oxycodone. As currently there is a shortage of Dilaudid, will plan for continuous Fentanyl infusion.  Plan for 15 g per hour with additional 20 g every 15 minutes as needed. We'll continue to titrate medication as needed to ensure her comfort.  Pain for addition of additional comfort meds per comfort care order set  Plan for referral for [facility] for end-of-life care.  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  Goals of Care and Additional Recommendations:  Limitations on Scope of Treatment: Full Comfort Care and Full Scope Treatment  Code Status:  DNR  Prognosis:   < 2 weeks  Discharge Planning:  Hospice facility  Care plan was discussed with patient, niece, Dr. Ree Kida  Thank you for allowing the Palliative Medicine Team to assist in the care of this patient.   Time In: 1010 1350 Time Out: 1035 1430 Total  Time 65 minutes Prolonged Time Billed Yes      Greater than 50%  of this time was spent counseling and coordinating care related to the above assessment and plan. Micheline Rough, MD [place] Team [phone]    Please contact Palliative Medicine Team phone at [phone] for questions and concerns.

## 2017-01-15 NOTE — Plan of Care (Signed)
Problem: Education: Goal: Knowledge of disease and its progression will improve Outcome: Progressing Encourage patient to verbalize pain and discomfort.

## 2017-01-15 NOTE — Progress Notes (Signed)
[facility] Progress Note   Subjective:   Had HD yesterday + 1 unit of blood I note family has declined Pleurx catheter and is considering Hospice Her itching is better Back on NRB 2/2 O2 desat Says "feels kinda bad"  ets SOB off NRB and hard for me to understand what she says while wearing the NRB mask   Vitals:   01/15/17 0500 01/15/17 0600 01/15/17 0730 01/15/17 0734  BP:      Pulse: 94 (!) 101    Resp: (!) 25 16    Temp:      TempSrc:      SpO2: 98% 98% 100% 100%  Weight:      Height:       Exam: VS as noted Less air hungry  Left side clear, minimal breath sounds at al R chest Abd soft and non-tender Ext no edema Difficult to talk to/understand her b/o the NRB AVG + bruit  Inpatient medications: . amLODipine  10 mg Oral QPM  . aspirin  81 mg Oral q morning - 10a  . atorvastatin  80 mg Oral QHS  . darbepoetin (ARANESP) injection - DIALYSIS  100 mcg Intravenous Q Tue-HD  . doxercalciferol  2 mcg Intravenous Q T,Th,Sa-HD  . feeding supplement (PRO-STAT SUGAR FREE 64)  30 mL Oral BID  . ferric citrate  420 mg Oral TID WC  . linagliptin  5 mg Oral Daily  . loratadine  10 mg Oral Daily  . mouth rinse  15 mL Mouth Rinse BID  . metoprolol tartrate  25 mg Oral BID  . mometasone-formoterol  2 puff Inhalation BID  . multivitamin  1 tablet Oral QHS  . nystatin  5 mL Oral QID  . pantoprazole  40 mg Oral Daily  . sodium chloride flush  3 mL Intravenous Q12H  . tiotropium  18 mcg Inhalation Daily   prn medications acetaminophen **OR** acetaminophen, albuterol, alum & mag hydroxide-simeth, docusate sodium, oxyCODONE, polyethylene glycol   Recent Labs Lab 01/13/17 0825 01/14/17 0645 01/15/17 0346  NA 133* 131* 133*  K 4.7 4.9 4.2  CL 95* 92* 93*  CO2 25 26 28   GLUCOSE 256* 291* 269*  BUN 21* 33* 14  CREATININE 4.52* 6.21* 3.88*  CALCIUM 9.2 9.3 9.2  PHOS  --  4.3  --     Recent Labs Lab 01/14/17 0645  ALBUMIN 2.0*    Recent Labs Lab  01/13/17 0825 01/14/17 0645 01/15/17 0346  WBC 11.8* 12.6* 13.6*  HGB 8.2* 7.6* 9.4*  HCT 26.9* 25.1* 29.9*  MCV 100.7* 100.0 98.4  PLT 342 324 338    Dg Chest Port 1 View  Result Date: 01/12/2017 CLINICAL DATA:  Dyspnea EXAM: PORTABLE CHEST 1 VIEW COMPARISON:  01/10/2017 chest radiograph. FINDINGS: Right rotated chest radiograph. Vascular stent in the upper mediastinum appears stable. Stable cardiomediastinal silhouette with normal heart size. No pneumothorax. Large right pleural effusion is stable. Trace left pleural effusion is stable. Complete opacification of the mid to lower right lung, unchanged. Mild patchy left lung base opacity is unchanged. IMPRESSION: Stable chest radiograph with large right and trace left pleural effusions, complete opacification of the mid to lower right lung and mild patchy left lung base opacity. Electronically Signed   By: Delbert Phenix M.D.   On: 01/12/2017 17:35   Dialysis:  TTS at [facility] 3:45hrs,  BFR 400, DFR A1.5,  2K/2.25Ca,  Profile 4,  EDW 67.5kg AVG - Heparin 2000 unit bolus q HD - Hectoral  IV q HD - Mircera 150mcg q 2 weeks (just reordered, last given 11/24/16)  Assessment: 1. Hypoxemia/ large RLL and RML PNA/ exudative R pleural effusion - s/p IV abx ->levaquin, not surg candidate. No improvement on exam (clinically worse). Thoracentesis 01/05/17 of 300 cc. By report some loculated/not accessible to tap.5/22 progressively more dyspneic, CXR near opacification of R chest. No plan for attempting a repeat thoracentesis and family declined Pleurx tube by IR.  2. ESRD HD TTS.  3. Dementia - Was living alone PTA. SNF at d/c 4. DM on oral meds 5. Anemia of CKD - started ESA here (darbe 100 5/22). Hb 7.6. Transfused 1 unit on HD 5/24 w/Hb up to 9.4 from 7.6 6. DNR status  Disposition - Discussion occurring about possible hospice. Please note that with needs for NRB for O2 desats that this would NOT be available at an out patient HD unit. In this  vein, if Hospice is being considered and these O2 needs continue would need to think about FULL comfort care which would not include dialysis.   Camille Balynthia Milanya Sunderland, MD [facility] [phone] Pager 01/15/2017, 9:58 AM

## 2017-01-15 NOTE — Progress Notes (Signed)
FMTS Attending Note  Spoke with Claudia Rich (Niece of Patient) on 5/24 at 1430. We had a very nice 30 minute conversation about the care of Ms. Claudia Rich. We addressed the following issues:  Pneumonia - From our standpoint the pneumonia has been adequately treated. Treated with Vanco/Cefepime from 5/13-5/15. Transitioned to Vanco/Zosyn on 5/15-5/20. Levaquin initiated 5/20 (renally dosed).   Pleural Effusion - at time of conversation pulmonology not planning to repeat Thoracentesis. Awaiting IR recs. It now appears that pulmonology may be willing to place small gauge chest tube.  Palliative/Hospice - She is now considering Hospice. Spoke in length about goal is comfort. Ok to continue morphine/oxy for air hunger.   I plan to touch base with family again later today.  Patient currently on non-rebreather as desaturated again this AM. Patient currently more comfortable. Wean non-rebreather as able to.   Donnella ShamKyle Tesla Bochicchio MD

## 2017-01-15 NOTE — Plan of Care (Signed)
Problem: Activity: Goal: Risk for activity intolerance will decrease Outcome: Progressing Encourage patient to  stay calm- breath  Slowly -

## 2017-01-15 NOTE — Progress Notes (Signed)
While Dr. Randolm IdolFletke was in room having meeting with family, I attempted to tak the flap off of the NRB to see about weaning patients oxygen, sats went down into the upper 80s. Flap replaced on the NRB to make it 100% oxygen and MD made aware

## 2017-01-15 NOTE — Progress Notes (Signed)
CSW met with patient and niece, Hilda Blades, at bedside. Hilda Blades called patient's other niece and HCPOA, Minette Brine, during visit and CSW conferred with [place] via phone. Per family, patient was seen by palliative and residential hospice was recommended. Family reported preference for [facility]. CSW consulted with palliative, who indicated patient is not stable for discharge today. CSW made referral to [facility] for hospice assessment. Palliative to continue to follow. CSW to support with discharge planning.  Estanislado Emms, [place]

## 2017-01-15 NOTE — Progress Notes (Addendum)
Physical Therapy Treatment Patient Details Name: Claudia Rich MRN: [id number] DOB: 02/24/1927 Today's Date: 01/15/2017    History of Present Illness 83 yo female admitted with AMS  LLL infiltrate and L R pleural effusion with RML/ RLL collapse. PMH: ESRD TTHSAT COPD, PVD, dementia, CAD. DM. asthma.  s/p R throacentesis on 01/05/17.  Put on NRB to maintain sats on 01/12/17.     PT Comments    Per CG and RN, pt had a "bad night" with breathing last night. Pt agreeable to seated exercise with assistance. SaO2 dropped to 81% while on 15L non rebreather mask with activity, up to 100% with 1-2 minutes rest.   Follow Up Recommendations  SNF     Equipment Recommendations  Rolling walker with 5" wheels;3in1 (PT)    Recommendations for Other Services       Precautions / Restrictions Precautions Precautions: Fall Precaution Comments: O2 sats Restrictions Weight Bearing Restrictions: No    Mobility  Bed Mobility               General bed mobility comments: Pt was OOB in the chair. Did not attempt mobility due to desaturation to 81% with seated ther ex using non rebreather mask. Per CG and nursing, pt had a bad night and "looks worse" today.   Transfers                    Ambulation/Gait             General Gait Details: did not attempt at this time as pt continues to need NRB and with basic mobility and seated AA exercises pt's RR rate increased to the mid 30s.  We will gradually progress back to gait as pt's repiratory status allows.    Stairs            Wheelchair Mobility    Modified Rankin (Stroke Patients Only)       Balance                                            Cognition Arousal/Alertness: Awake/alert Behavior During Therapy: Anxious Overall Cognitive Status: Impaired/Different from baseline Area of Impairment: Orientation;Following commands;Safety/judgement;Problem solving                 Orientation Level:  Disoriented to;Time;Situation     Following Commands: Follows one step commands inconsistently Safety/Judgement: Decreased awareness of safety   Problem Solving: Slow processing;Difficulty sequencing;Requires verbal cues General Comments: 02 sats decrease with anxiety      Exercises General Exercises - Upper Extremity Shoulder Flexion: AAROM;Both;10 reps;Seated Elbow Flexion: AAROM;Both;10 reps;Seated Elbow Extension: AAROM;Both;10 reps;Seated General Exercises - Lower Extremity Ankle Circles/Pumps: AAROM;Both;10 reps;Seated Long Arc Quad: Both;10 reps;AAROM;Seated Heel Slides: AAROM;Both;10 reps;Seated Hip ABduction/ADduction: AAROM;Both;10 reps Hip Flexion/Marching: AAROM;Both;10 reps;Seated    General Comments        Pertinent Vitals/Pain Pain Assessment: Faces Pain Score: 0-No pain    Home Living                      Prior Function            PT Goals (current goals can now be found in the care plan section) Acute Rehab PT Goals Patient Stated Goal: not stated.  Pt doesn't say much .  PT Goal Formulation: Patient unable to participate in goal setting Time For Goal Achievement:  01/18/17 Potential to Achieve Goals: Fair Progress towards PT goals: Not progressing toward goals - comment (desaturates with seated exercise)    Frequency    Min 2X/week      PT Plan Current plan remains appropriate    Co-evaluation              AM-PAC PT "6 Clicks" Daily Activity  Outcome Measure  Difficulty turning over in bed (including adjusting bedclothes, sheets and blankets)?: Total Difficulty moving from lying on back to sitting on the side of the bed? : Total Difficulty sitting down on and standing up from a chair with arms (e.g., wheelchair, bedside commode, etc,.)?: Total Help needed moving to and from a bed to chair (including a wheelchair)?: A Lot Help needed walking in hospital room?: Total Help needed climbing 3-5 steps with a railing? : Total 6  Click Score: 7    End of Session Equipment Utilized During Treatment: Oxygen (NRB mask, 15 L O2) Activity Tolerance: Patient limited by fatigue;Other (comment) (limited by DOE, increased RR) Patient left: in chair;with call [place]/phone within reach;with family/visitor present;with chair alarm set Nurse Communication: Mobility status PT Visit Diagnosis: Muscle weakness (generalized) (M62.81);Difficulty in walking, not elsewhere classified (R26.2);Unsteadiness on feet (R26.81);Other (comment) (decreased activity tolerance)     Time: 1610-9604 PT Time Calculation (min) (ACUTE ONLY): 17 min  Charges:  $Therapeutic Exercise: 8-22 mins                    G Codes:          Tamala Ser 01/15/2017, 11:00 AM [phone]

## 2017-01-16 DIAGNOSIS — R0603 Acute respiratory distress: Secondary | ICD-10-CM

## 2017-01-16 MED ORDER — ALUM & MAG HYDROXIDE-SIMETH 200-200-20 MG/5ML PO SUSP: 30.0000 mL | Freq: Four times a day (QID) | ORAL | 0 refills | Status: AC | PRN

## 2017-01-16 MED ORDER — LORAZEPAM 2 MG/ML IJ SOLN: 1.0000 mg | INTRAMUSCULAR | 0 refills | Status: AC | PRN

## 2017-01-16 MED ORDER — TIOTROPIUM BROMIDE MONOHYDRATE 18 MCG IN CAPS: 18.0000 ug | ORAL_CAPSULE | Freq: Every day | RESPIRATORY_TRACT | 12 refills | Status: AC

## 2017-01-16 MED ORDER — HYDROCORTISONE 1 % EX CREA: TOPICAL_CREAM | Freq: Four times a day (QID) | CUTANEOUS | 0 refills | Status: AC | PRN

## 2017-01-16 MED ORDER — PRO-STAT SUGAR FREE PO LIQD: 30.0000 mL | Freq: Two times a day (BID) | ORAL | 0 refills | Status: AC

## 2017-01-16 MED ORDER — LORAZEPAM 1 MG PO TABS: 1.0000 mg | ORAL_TABLET | ORAL | 0 refills | Status: AC | PRN

## 2017-01-16 MED ORDER — GLYCOPYRROLATE 1 MG PO TABS: 1.0000 mg | ORAL_TABLET | ORAL | Status: AC | PRN

## 2017-01-16 MED ORDER — HALOPERIDOL 0.5 MG PO TABS: 0.5000 mg | ORAL_TABLET | ORAL | Status: AC | PRN

## 2017-01-16 MED ORDER — HALOPERIDOL LACTATE 2 MG/ML PO CONC: 0.5000 mg | ORAL | 0 refills | Status: AC | PRN

## 2017-01-16 MED ORDER — POLYETHYLENE GLYCOL 3350 17 G PO PACK: 17.0000 g | PACK | Freq: Every day | ORAL | 0 refills | Status: AC | PRN

## 2017-01-16 MED ORDER — GLYCOPYRROLATE 0.2 MG/ML IJ SOLN: 0.2000 mg | INTRAMUSCULAR | Status: AC | PRN

## 2017-01-16 MED ORDER — PANTOPRAZOLE SODIUM 40 MG PO TBEC: 40.0000 mg | DELAYED_RELEASE_TABLET | Freq: Every day | ORAL | Status: AC

## 2017-01-16 MED ORDER — FENTANYL BOLUS VIA INFUSION: 20.0000 ug | INTRAVENOUS | 0 refills | Status: AC | PRN

## 2017-01-16 MED ORDER — LORAZEPAM 2 MG/ML PO CONC: 1.0000 mg | ORAL | 0 refills | Status: AC | PRN

## 2017-01-16 MED ORDER — MOMETASONE FURO-FORMOTEROL FUM 200-5 MCG/ACT IN AERO: 2.0000 | INHALATION_SPRAY | Freq: Two times a day (BID) | RESPIRATORY_TRACT | Status: AC

## 2017-01-16 MED ORDER — HALOPERIDOL LACTATE 5 MG/ML IJ SOLN: 0.5000 mg | INTRAMUSCULAR | Status: AC | PRN

## 2017-01-16 MED ORDER — NYSTATIN 100000 UNIT/ML MT SUSP: 5.0000 mL | Freq: Four times a day (QID) | OROMUCOSAL | 0 refills | Status: AC

## 2017-01-16 NOTE — Progress Notes (Signed)
The patient is refusing breakfast and medications at this time. Took only one sip of tea. RN- "Are you comfortable?"  Patient- "yes". Will continue to monitor closely and offer food and drink.   Sheppard Evensina Zade Falkner RN

## 2017-01-16 NOTE — Progress Notes (Signed)
[facility] and a discharge summary faxed. The patient will be leaving via [facility]. I will gave report on their arrival and continue to monitor the patient closely.   Sheppard Evensina Steve Gregg RN

## 2017-01-16 NOTE — Progress Notes (Signed)
The [facility] was [facility]. Fentanyl 200 mL wasted with Tommy MedalJosh Townes RN and documented in the pyxis.    Sheppard Evensina Dannika Hilgeman RN Tommy MedalJosh Townes RN

## 2017-01-16 NOTE — Progress Notes (Signed)
Family Medicine Teaching Service Daily Progress Note Intern Pager: [phone]  Patient name: Claudia Rich Medical record number: [id number] Date of birth: 1926/12/24 Age: 83 y.o. Gender: female  Primary Care Provider: Araceli Boucheumley, [place], DO Consultants: Renal Code Status: Full  Pt Overview and Major Events to Date:  5/13 admit to FPTS  Assessment and Plan: Claudia Ridgesddie R Henkes is a 83 y.o. female presenting with SOB, AMS . PMH is significant for ESRD on HD, dementia, CAD, T2DM, asthma.  #SOB with Hypoxia secondary to Pneumonia Patient is day #9 s/p thoracentesis by pulmonology with 300 ml cloudy pleural fluid obtained in the setting of a RML pneumonia. Loculated pocket of fluid unable to be removed. Given decline in respiratory status over the past few days, patient age and comorbidities in addition to poor prognosis, a long discussion took place with family members, POA, the primary team and palliative care. The decision was made to place patient on hospice care. --Continue telemetry  --Completed course of Levaquin 750 mg loading dose (5/20) DAY 1 and 500 mg q48h Start 5/22. Last dose today 5/24. --Started patient on fentanyl drip --O2 prn saturations <92% --Discontiued on Oxycodone concentrated solution 5 mg every 1 hr pr SOB, agitation, anxiety --Continue Dulera and Spiriva as scheduled  --[facility]  #AMS, improving Patient's mental status have been improving in the past few days likely combination of baseline dementia and delirium. She is much improved and almost back to baseline. --Will continue to monitor --Reorient as needed --Delirium precautions, family at bedside, open blinds  #HTN  Patient is currently on metoprolol 25 mg and norvasc 10 mg. BP this am is 120/81 with mild tachycardia noted this morning.  --Continue amlodipine 10 mg daily --Continue metoprolol 25 mg bid   #ESRD on HD Patient is on Tu,Th, Sat schedule. Patient has tolerated all her HD  sessions without complications. Given patient recent increase in oxygen demand requiring NRB mask and new hospice status, dialysis will not be an option in the outpatient setting and patient will probably be place on full comfort care as noted by nephrology note. --Will continue to monitor  #DM2 11/2016 A1c 7.9. Patient's po intake continue to improve. Given patient current clinical status and poor prognosis aggressive control of BG might not be the goal with minimal benefits from such plan. Will resume home regimen and follow. --Discontinue sSSI while hospitalized --Restarted Trajenta 5 mg daily   #Pressure wound Patient has stage II pressure ulcer wound on the right buttock. Appears dry and is being assess regularly by nursing. Patient endorsed some discomfort yesterday and requested pain meds after tylenol did not provide any relief. --Would consult wound care as needed --Will follow up on pain   #GERD w/ h/o peptic ulcer --Continue pantoprazole 40 mg daily    #Oral Thrush --Continue Nystatin, as needed   FEN/GI: Dysphagia 2 Fine chop, meds whole w/ pudding, HH/Carb mod renal diet, Protonix Prophylaxis: Lovenox subq renal dosed   Disposition: Anticipate discharge in the next 48 hrs as soon as bed is available at [facility].  Subjective:  Patient is less alert this morning, sitting bed very sleepy barely able to answer my questions. Patient will be transferred today to [facility] for comfort care/hospice.  Objective: Temp:  [98.3 F (36.8 C)] 98.3 F (36.8 C) (05/25 2015) Pulse Rate:  [100-102] 102 (05/25 2015) Resp:  [22] 22 (05/25 2015) BP: (120-121)/(79-81) 120/81 (05/25 2015) SpO2:  [99 %-100 %] 100 % (05/25 2142)  FiO2 (%):  [100 %] 100 % (05/25 0730)   Physical Exam:  General: patient appears uncomfortable, breathing comfortably Cardiovascular: RRR, no m/r/g Respiratory: minimal air movement throughout right lung fields, moving air clearly upper and lower lobes  on the left. Abdomen: soft and nontender, nondistended Extremities: no LE edema Neuro: AOx3, no focal deficits  Laboratory:  Recent Labs Lab 01/13/17 0825 01/14/17 0645 01/15/17 0346  WBC 11.8* 12.6* 13.6*  HGB 8.2* 7.6* 9.4*  HCT 26.9* 25.1* 29.9*  PLT 342 324 338    Recent Labs Lab 01/13/17 0825 01/14/17 0645 01/15/17 0346  NA 133* 131* 133*  K 4.7 4.9 4.2  CL 95* 92* 93*  CO2 25 26 28   BUN 21* 33* 14  CREATININE 4.52* 6.21* 3.88*  CALCIUM 9.2 9.3 9.2  GLUCOSE 256* 291* 269*    Imaging/Diagnostic Tests: Dg Chest 2 View: 01/03/2017 FINDINGS: Moderate right pleural effusion. Associated right lower lobe opacity, likely compressive atelectasis. No frank interstitial edema.  No pneumothorax. The heart is normal in size. Vascular stent overlying the mediastinum. Old left rib fracture deformities.  IMPRESSION: Moderate right pleural effusion. Associated right lower lobe opacity, likely compressive atelectasis.   Ct Angio Chest Pe W Or Wo Contrast  01/04/2017 IMPRESSION: Large right pleural effusion layering dependently with subtotal collapse of the right lung. Patchy pneumonia left lower lobe.  Left lower lobe bronchiectasis. 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 Aortic and coronary artery calcification. Multiple venous collaterals suggest stenosis of the the SVC at the SVC innominate junction. Innominate stent in place. Uncertain if this is patent.    Lovena Neighbours, MD 01/16/2017, 6:32 AM PGY-1, [facility] Health Family Medicine FPTS Intern pager: [phone], text pages welcome

## 2017-01-16 NOTE — Progress Notes (Signed)
Pt now comfort care with plan [facility] bed [facility]. Spoke to Hospice [facility] Palliative Care hospital liaison who states, bed available today. She is in communication with Konrad FelixHCPOA, [name], and faxing admission paperwork this am. [facility]. Chart reviewed. No family at bedside. Will notify attending for DC summary. Eduard RouxSarah Milta Croson, ANP-ACHPN

## 2017-01-16 NOTE — Progress Notes (Signed)
0900--[facility] Hospital Liaison for [facility]  Received request from CSW for [facility] today. Chart reviewed. Phone call with niece [name] who is HCPOA to confirm interest and explain services. CSW aware. Registration paperwork completed. Dr. Kern Reaponald Hertweck to assume care per family request for specialty end of life care per family request.  Please fax discharge summary to [phone].   RN please call report to [phone].  Please arrange transport for patient to arrive as early in day as is possible  Thank you, Haynes Bastracy Ennis, RN [facility] Liaison [phone]

## 2017-01-16 NOTE — Clinical Social Work Note (Signed)
Clinical Social Worker facilitated patient discharge including contacting patient family and facility [facility] to confirm patient discharge plans. Clinical information faxed to facility and family agreeable with plan. CSW [facility]  RN to call report to [phone] prior to discharge.  Clinical Social Worker will sign off for now as social work intervention is no longer needed. Please consult us again if new need arises.  Claudia Rich,MSW, LCSWA Clinical Social Work Dept Weekend Social Worker [phone] 11:12 AM

## 2017-01-21 ENCOUNTER — Other Ambulatory Visit: Payer: Self-pay | Admitting: Family Medicine

## 2017-01-22 DEATH — deceased

## 2017-01-25 ENCOUNTER — Telehealth: Payer: Self-pay | Admitting: Family Medicine

## 2017-01-25 NOTE — Telephone Encounter (Signed)
Death certificate dropped off by [employer] late Friday afternoon. Placed in Dr Dole Foodumley's box for completion. Please return to me once completed.  Claudia Rich

## 2017-08-05 DIAGNOSIS — Z7189 Other specified counseling: Secondary | ICD-10-CM

## 2018-04-17 IMAGING — DX DG CHEST 1V PORT
1 series · 1 of 1 positions shown · non-contrast
Comparison: Portable chest x-ray January 05, 2017

CLINICAL DATA: Hypoxia, history of hypertension, dialysis dependent
renal failure

EXAM:
PORTABLE CHEST 1 VIEW

[chest ap]
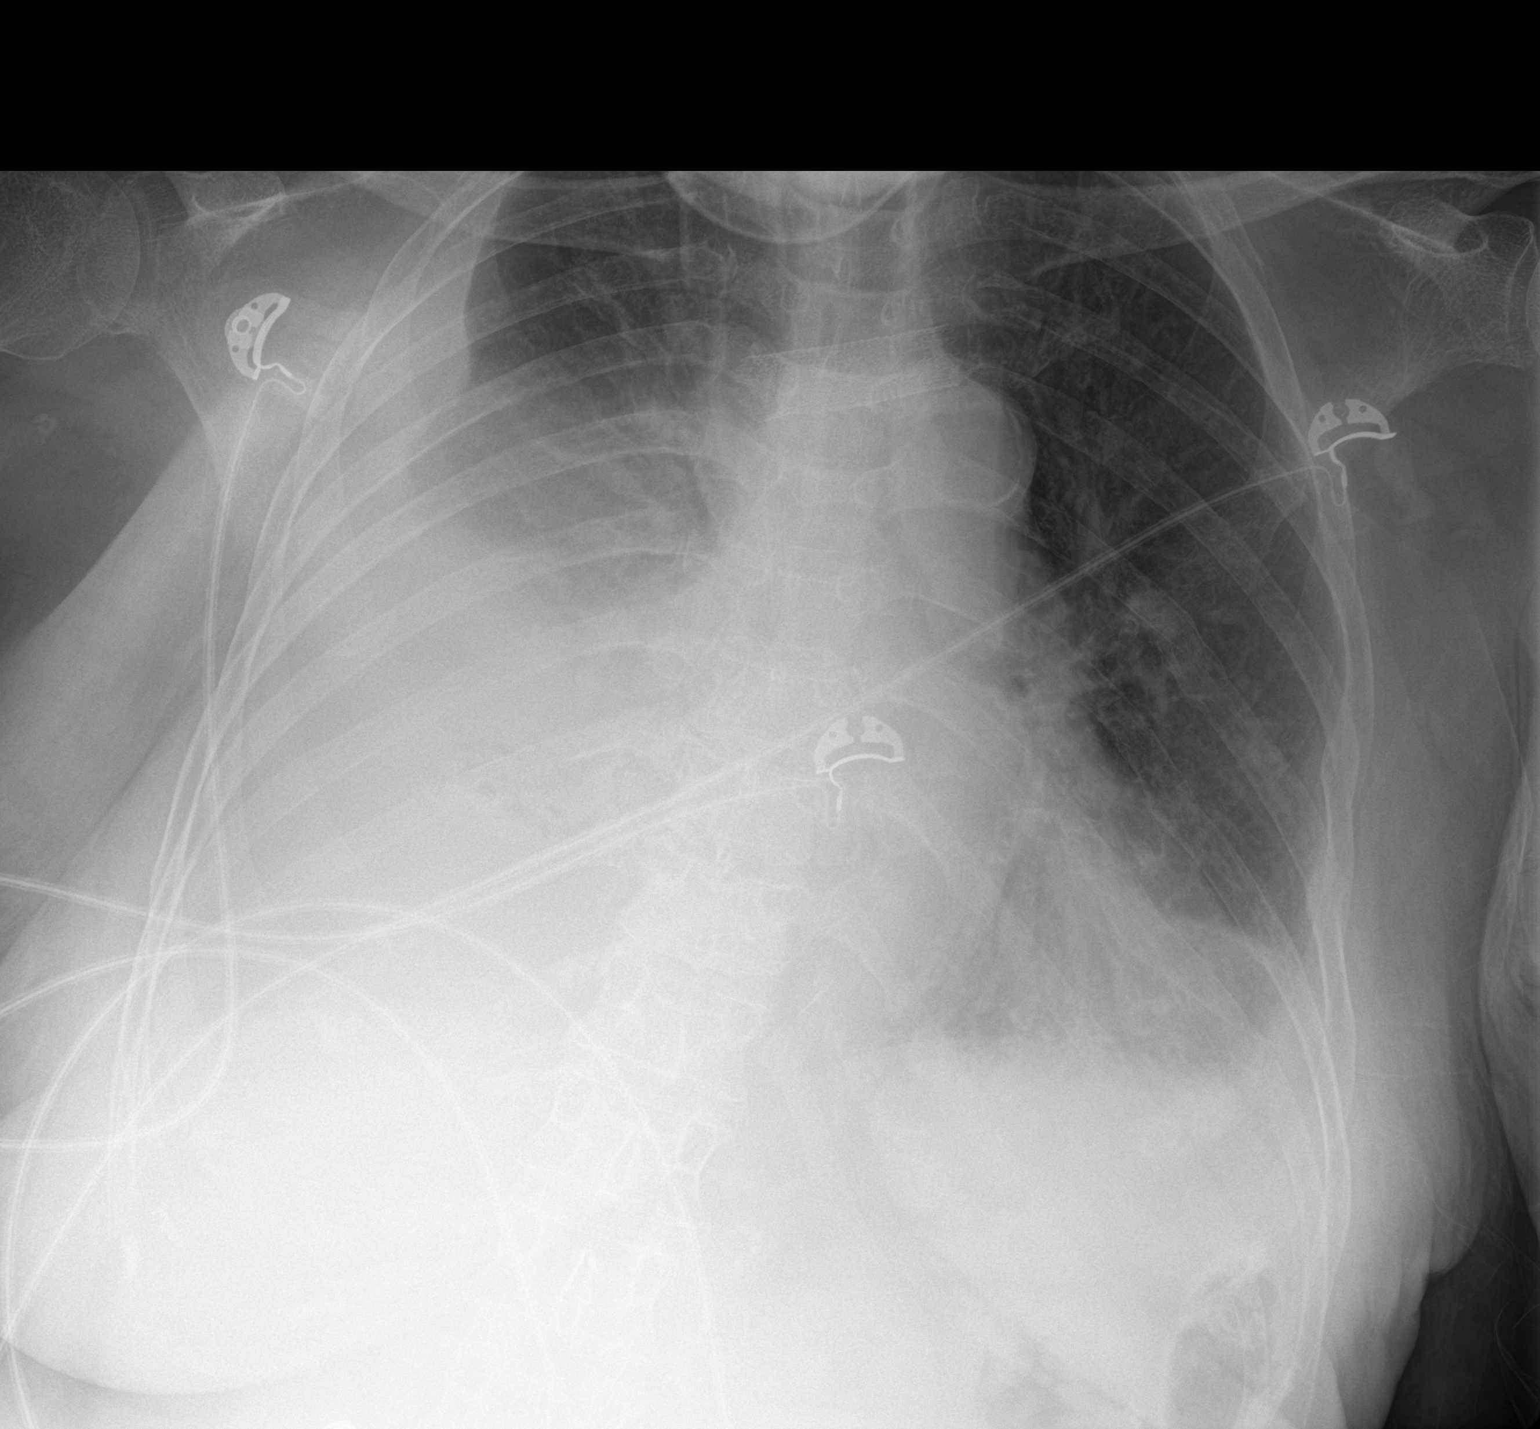

[1 of 1 positions shown; findings below may reference images not displayed]

FINDINGS: There remains a large right pleural effusion. Right basilar
atelectasis or pneumonia is present and more conspicuous today. On
the left there is increased density at the lung base which is
stable. There is small left pleural effusion. The left mid and upper
lung are clear. The pulmonary vascularity is mildly prominent. There
is calcification in the wall of the aortic arch. There are bold
fractures of the lateral aspects of the sixth and seventh left ribs.
IMPRESSION: Persistent large right pleural effusion. Decreased aeration of the
right lower lung worrisome for progressive atelectasis or pneumonia.
Small left pleural effusion. No significant pulmonary edema.

Thoracic aortic atherosclerosis.

## 2018-04-17 IMAGING — CT CT HEAD W/O CM
4 series · 16 of 47 positions shown, 18 images · non-contrast
Comparison: 11/04/2010

CLINICAL DATA: Aphasia, history of stroke and hypertension

EXAM:
CT HEAD WITHOUT CONTRAST
TECHNIQUE: Contiguous axial images were obtained from the base of the skull
through the vertex without intravenous contrast.

[Series 3: head without · axial · non-contrast · 0.46mm/px · z∈[-130,-10]mm · 7 of 34 slices shown, 9 images]
[im 5/34  brain]
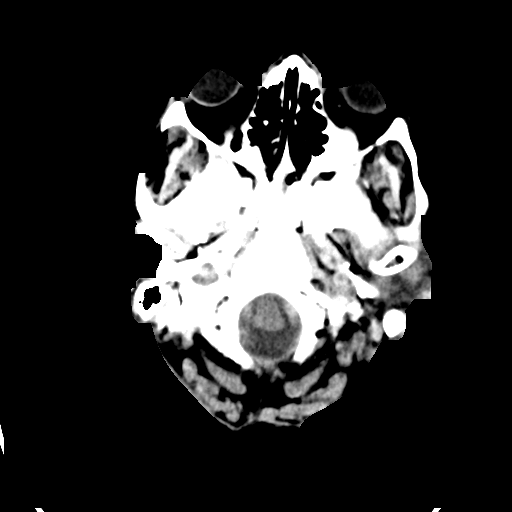
[im 5/34  bone]
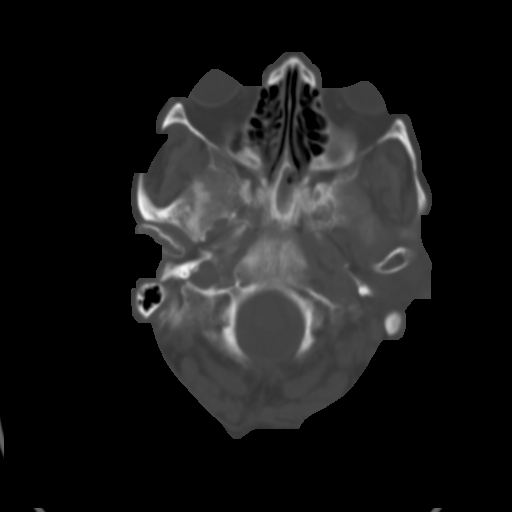
[im 9/34  brain]
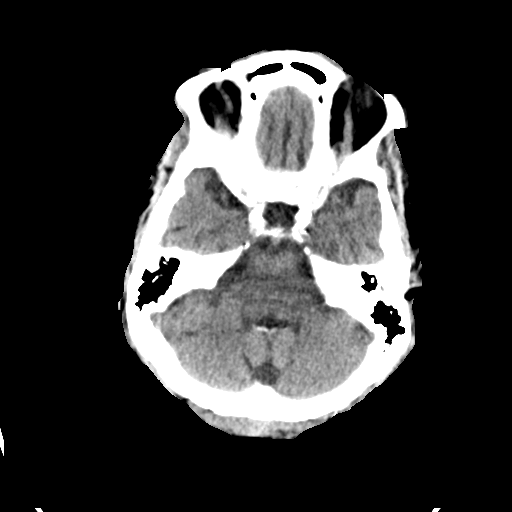
[im 13/34  brain]
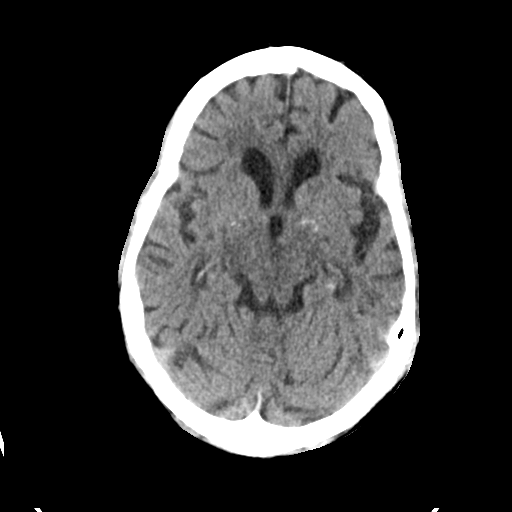
[im 17/34  brain]
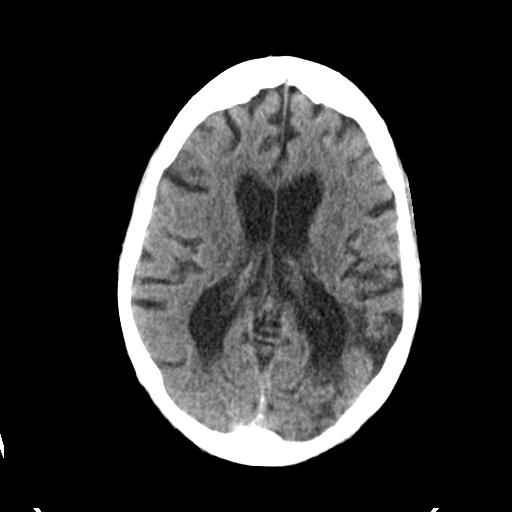
[im 21/34  brain]
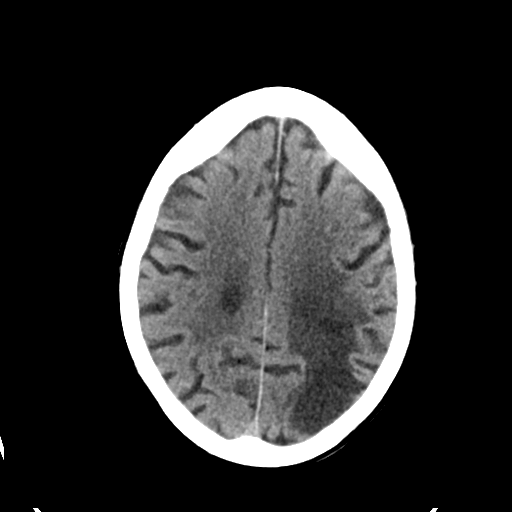
[im 21/34  bone]
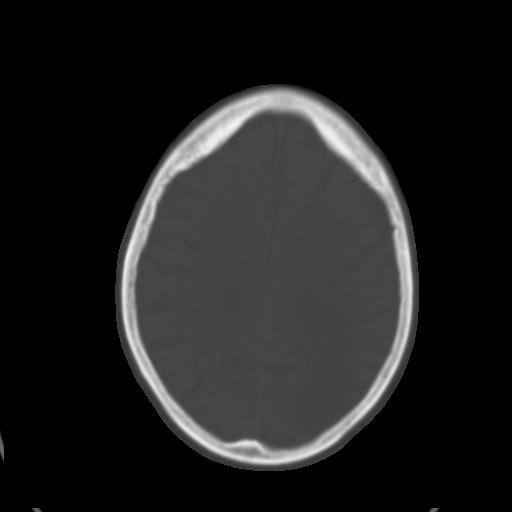
[im 25/34  brain]
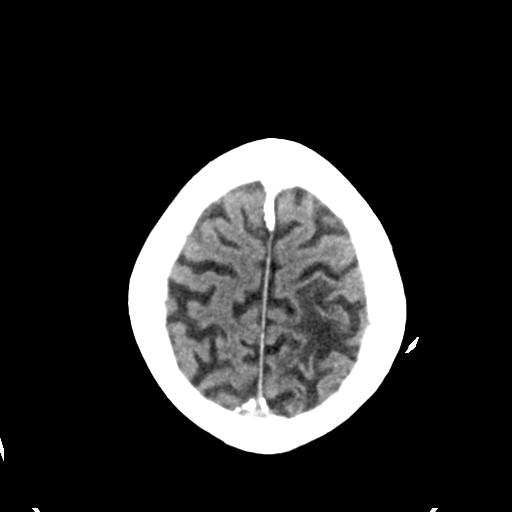
[im 29/34  brain]
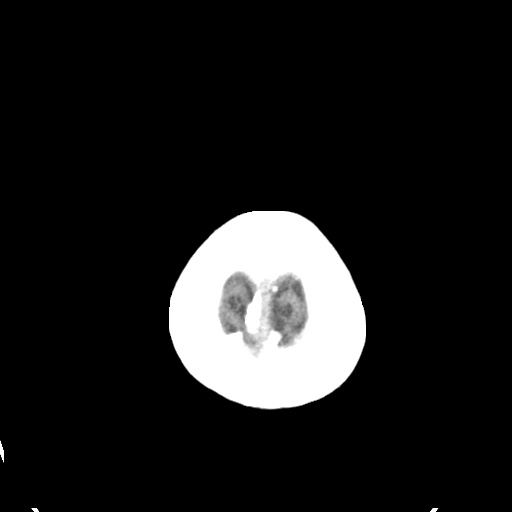

[Series 4: head bone · axial · 0.46mm/px · z∈[-134,-102]mm · 3 of 84 slices shown]
[im 9/84  bone]
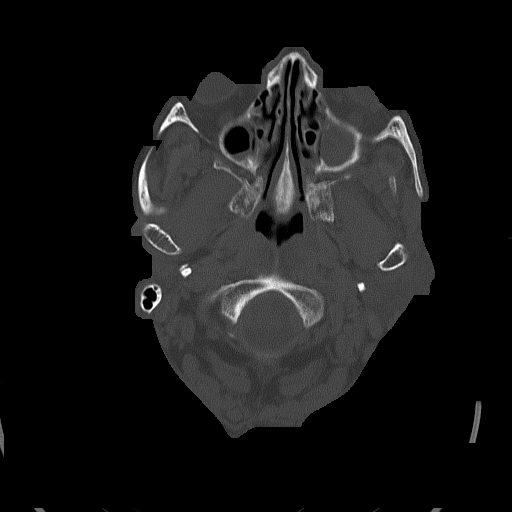
[im 17/84  bone]
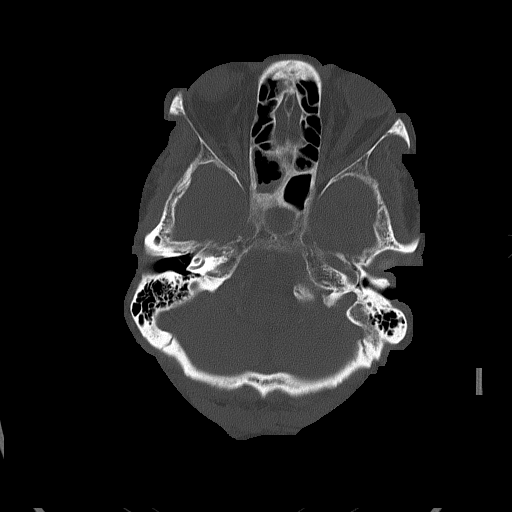
[im 25/84  bone]
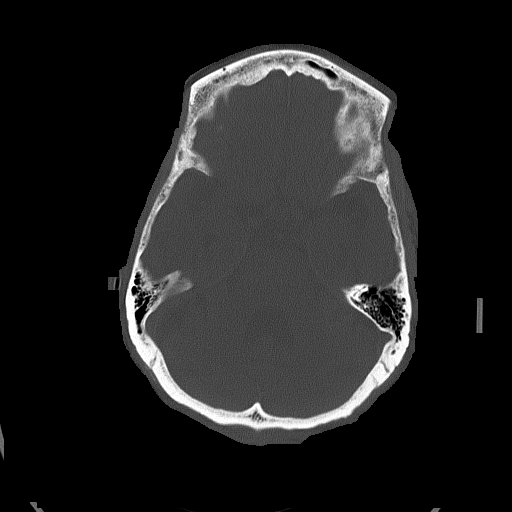

[Series 5: head without cor · coronal · non-contrast · 0.30mm/px · 3 of 69 slices shown]
[im 23/69  brain]
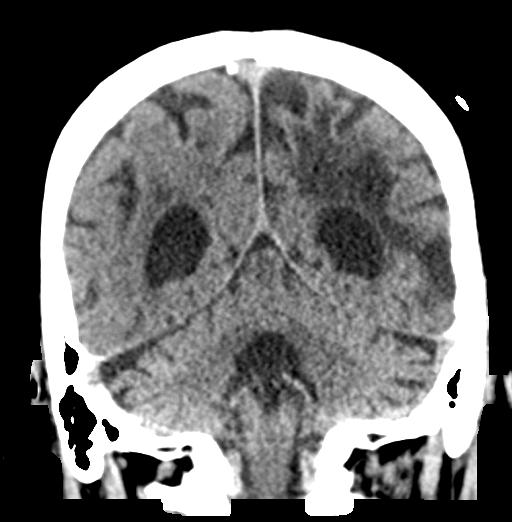
[im 31/69  brain]
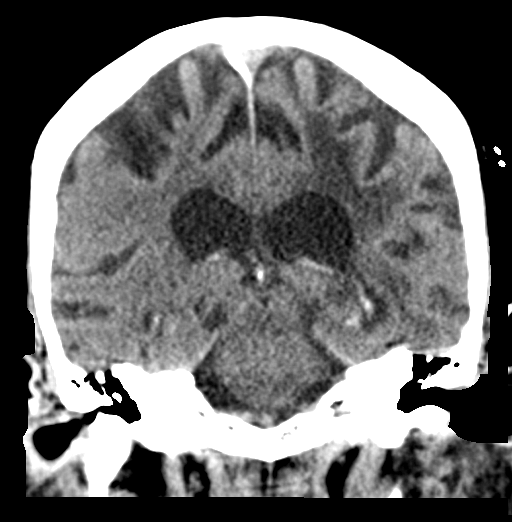
[im 38/69  brain]
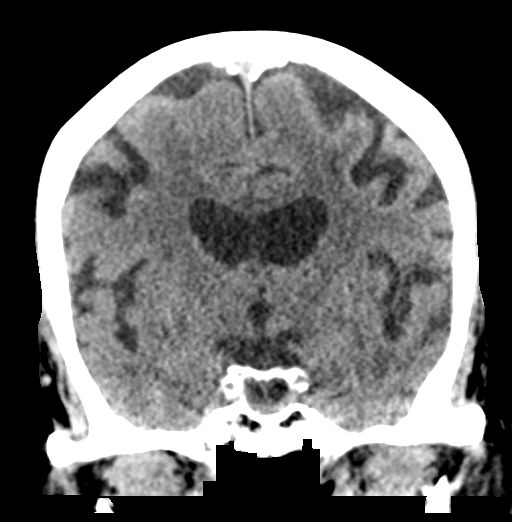

[Series 6: head without sag · sagittal · non-contrast · 0.31mm/px · 3 of 52 slices shown]
[im 18/52  brain]
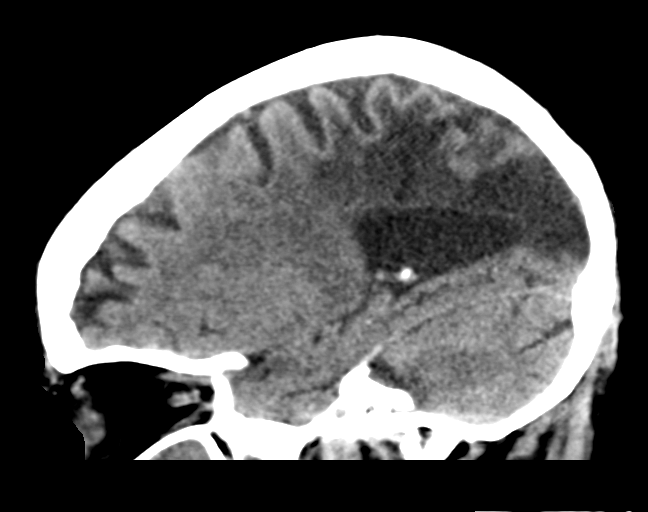
[im 26/52  brain]
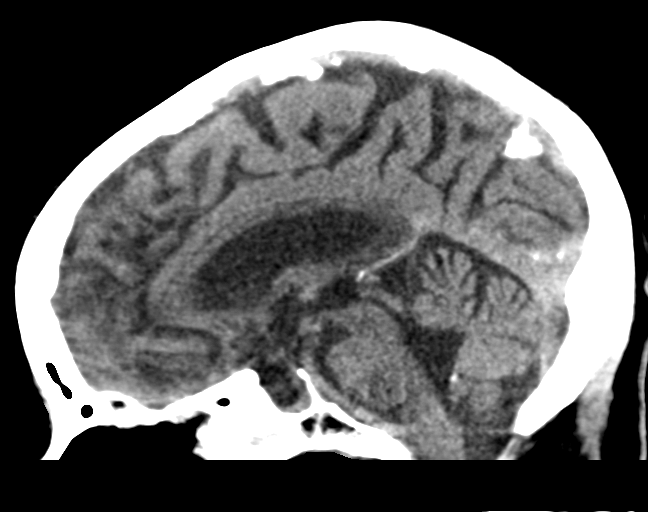
[im 35/52  brain]
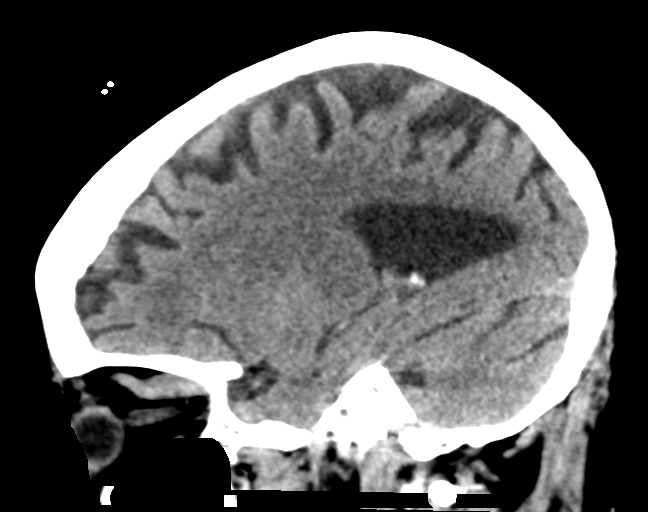

[16 of 47 positions shown; findings below may reference images not displayed]

FINDINGS: Brain: Left parietal encephalomalacia is noted consistent with a
chronic left MCA distribution infarct since 1381. Hypodensity in the
left caudate head is also noted which may reflect a remote lacunar
infarct as well. Chronic appearing small vessel ischemic changes are
otherwise noted bilaterally. Sulcal and ventricular prominence is
noted consistent with atrophy. No acute intracranial hemorrhage,
midline shift or edema. Cerebellum is intact. No effacement of the
basal cisterns or fourth ventricle.

Vascular: Moderate bilateral carotid siphon calcifications. No
hyperdense vessels.

Skull: Maxillary sinus wall thickening consistent with chronic
sinusitis. No acute skull fracture nor suspicious lesions.

Sinuses/Orbits: Mild-to-moderate ethmoid, sphenoid and right
maxillary sinus mucosal thickening. Moderate to severe mucosal
thickening of the visualized left maxillary sinus. The frontal sinus
appears clear. The orbits are unremarkable. The globes are symmetric
in appearance with evidence of bilateral prior lens replacement.

Other: None
IMPRESSION: 1. Remote left MCA distribution infarct with parietal lobe
encephalomalacia. Probable remote left caudate head lacunar infarct.
Cerebral atrophy with chronic small vessel ischemia.
2. No acute intracranial abnormality is identified.
3. Chronic paranasal sinusitis.
